# Patient Record
Sex: Female | Born: 1975 | Race: Black or African American | Hispanic: No | Marital: Single | Smoking: Never smoker
Health system: Southern US, Community
[De-identification: ages and names within clinical notes are randomized; demographics above are authoritative.]

## PROBLEM LIST (undated history)

## (undated) ENCOUNTER — Inpatient Hospital Stay (HOSPITAL_COMMUNITY): Payer: Self-pay

## (undated) ENCOUNTER — Emergency Department: Discharge status: Absent without leave | Payer: BC Managed Care – PPO

## (undated) DIAGNOSIS — M359 Systemic involvement of connective tissue, unspecified: Secondary | ICD-10-CM

## (undated) DIAGNOSIS — J302 Other seasonal allergic rhinitis: Secondary | ICD-10-CM

## (undated) DIAGNOSIS — M329 Systemic lupus erythematosus, unspecified: Secondary | ICD-10-CM

## (undated) DIAGNOSIS — M797 Fibromyalgia: Secondary | ICD-10-CM

## (undated) DIAGNOSIS — F419 Anxiety disorder, unspecified: Secondary | ICD-10-CM

## (undated) DIAGNOSIS — IMO0002 Reserved for concepts with insufficient information to code with codable children: Secondary | ICD-10-CM

## (undated) DIAGNOSIS — I10 Essential (primary) hypertension: Secondary | ICD-10-CM

## (undated) HISTORY — PX: FOOT SURGERY: SHX648

## (undated) HISTORY — PX: TONSILLECTOMY: SUR1361

---

## 2008-05-02 ENCOUNTER — Ambulatory Visit: Payer: Self-pay | Admitting: Podiatry

## 2008-05-02 ENCOUNTER — Other Ambulatory Visit: Payer: Self-pay

## 2008-05-02 ENCOUNTER — Emergency Department: Payer: Self-pay | Admitting: Emergency Medicine

## 2009-02-08 ENCOUNTER — Ambulatory Visit: Payer: Self-pay | Admitting: Internal Medicine

## 2009-10-09 ENCOUNTER — Emergency Department: Payer: Self-pay | Admitting: Emergency Medicine

## 2009-10-13 HISTORY — PX: DILATION AND CURETTAGE OF UTERUS: SHX78

## 2010-04-15 ENCOUNTER — Ambulatory Visit: Payer: Self-pay | Admitting: Nurse Practitioner

## 2010-04-15 ENCOUNTER — Inpatient Hospital Stay (HOSPITAL_COMMUNITY): Admission: AD | Admit: 2010-04-15 | Discharge: 2010-04-15 | Payer: Self-pay | Admitting: Obstetrics & Gynecology

## 2010-04-18 ENCOUNTER — Ambulatory Visit: Payer: Self-pay | Admitting: Obstetrics and Gynecology

## 2010-04-19 ENCOUNTER — Ambulatory Visit: Payer: Self-pay | Admitting: Obstetrics and Gynecology

## 2010-04-23 LAB — PATHOLOGY REPORT

## 2010-10-13 HISTORY — PX: CHOLECYSTECTOMY: SHX55

## 2010-11-07 ENCOUNTER — Observation Stay: Payer: Self-pay | Admitting: Specialist

## 2010-11-20 ENCOUNTER — Ambulatory Visit: Payer: Self-pay | Admitting: Surgery

## 2010-11-21 ENCOUNTER — Ambulatory Visit: Payer: Self-pay | Admitting: Surgery

## 2010-11-25 LAB — PATHOLOGY REPORT

## 2010-12-10 ENCOUNTER — Ambulatory Visit: Payer: Self-pay | Admitting: Surgery

## 2010-12-13 ENCOUNTER — Ambulatory Visit: Payer: Self-pay | Admitting: Surgery

## 2010-12-16 ENCOUNTER — Emergency Department: Payer: Self-pay | Admitting: Emergency Medicine

## 2010-12-20 ENCOUNTER — Observation Stay: Payer: Self-pay | Admitting: Internal Medicine

## 2010-12-25 LAB — PATHOLOGY REPORT

## 2010-12-29 LAB — GC/CHLAMYDIA PROBE AMP, GENITAL
Chlamydia, DNA Probe: NEGATIVE
GC Probe Amp, Genital: NEGATIVE

## 2010-12-29 LAB — URINE MICROSCOPIC-ADD ON

## 2010-12-29 LAB — URINALYSIS, ROUTINE W REFLEX MICROSCOPIC
Bilirubin Urine: NEGATIVE
Glucose, UA: NEGATIVE mg/dL
Ketones, ur: NEGATIVE mg/dL
Leukocytes, UA: NEGATIVE
Nitrite: NEGATIVE
Protein, ur: NEGATIVE mg/dL
Specific Gravity, Urine: >1.03 — ABNORMAL HIGH (ref 1.005–1.030)
Urobilinogen, UA: 0.2 mg/dL (ref 0.0–1.0)
pH: 5.5 (ref 5.0–8.0)

## 2010-12-29 LAB — CBC
HCT: 36.3 % (ref 36.0–46.0)
Hemoglobin: 12.4 g/dL (ref 12.0–15.0)
MCH: 29.5 pg (ref 26.0–34.0)
MCHC: 34 g/dL (ref 30.0–36.0)
MCV: 86.7 fL (ref 78.0–100.0)
Platelets: 290 10*3/uL (ref 150–400)
RBC: 4.19 MIL/uL (ref 3.87–5.11)
RDW: 12.6 % (ref 11.5–15.5)
WBC: 4.6 10*3/uL (ref 4.0–10.5)

## 2010-12-29 LAB — WET PREP, GENITAL
Clue Cells Wet Prep HPF POC: NONE SEEN
Trich, Wet Prep: NONE SEEN
Yeast Wet Prep HPF POC: NONE SEEN

## 2010-12-29 LAB — POCT PREGNANCY, URINE: Preg Test, Ur: POSITIVE

## 2010-12-29 LAB — ABO/RH: ABO/RH(D): O POS

## 2010-12-29 LAB — HCG, QUANTITATIVE, PREGNANCY: hCG, Beta Chain, Quant, S: 212 m[IU]/mL — ABNORMAL HIGH (ref <5)

## 2011-06-27 ENCOUNTER — Inpatient Hospital Stay (HOSPITAL_COMMUNITY)
Admission: AD | Admit: 2011-06-27 | Discharge: 2011-06-27 | Disposition: A | Discharge status: Home / Self Care | Payer: BC Managed Care – PPO | Source: Ambulatory Visit | Attending: Obstetrics and Gynecology | Admitting: Obstetrics and Gynecology

## 2011-06-27 ENCOUNTER — Encounter (HOSPITAL_COMMUNITY): Payer: Self-pay | Admitting: *Deleted

## 2011-06-27 ENCOUNTER — Inpatient Hospital Stay (HOSPITAL_COMMUNITY): Payer: BC Managed Care – PPO

## 2011-06-27 DIAGNOSIS — O2390 Unspecified genitourinary tract infection in pregnancy, unspecified trimester: Secondary | ICD-10-CM | POA: Insufficient documentation

## 2011-06-27 DIAGNOSIS — O98519 Other viral diseases complicating pregnancy, unspecified trimester: Secondary | ICD-10-CM | POA: Insufficient documentation

## 2011-06-27 DIAGNOSIS — N760 Acute vaginitis: Secondary | ICD-10-CM | POA: Insufficient documentation

## 2011-06-27 DIAGNOSIS — O4690 Antepartum hemorrhage, unspecified, unspecified trimester: Secondary | ICD-10-CM | POA: Diagnosis present

## 2011-06-27 DIAGNOSIS — A6000 Herpesviral infection of urogenital system, unspecified: Secondary | ICD-10-CM | POA: Insufficient documentation

## 2011-06-27 DIAGNOSIS — N898 Other specified noninflammatory disorders of vagina: Secondary | ICD-10-CM | POA: Diagnosis present

## 2011-06-27 DIAGNOSIS — O0990 Supervision of high risk pregnancy, unspecified, unspecified trimester: Secondary | ICD-10-CM

## 2011-06-27 DIAGNOSIS — M329 Systemic lupus erythematosus, unspecified: Secondary | ICD-10-CM | POA: Diagnosis present

## 2011-06-27 DIAGNOSIS — N96 Recurrent pregnancy loss: Secondary | ICD-10-CM | POA: Diagnosis present

## 2011-06-27 DIAGNOSIS — B9689 Other specified bacterial agents as the cause of diseases classified elsewhere: Secondary | ICD-10-CM | POA: Insufficient documentation

## 2011-06-27 DIAGNOSIS — A499 Bacterial infection, unspecified: Secondary | ICD-10-CM | POA: Insufficient documentation

## 2011-06-27 HISTORY — DX: Essential (primary) hypertension: I10

## 2011-06-27 HISTORY — DX: Systemic lupus erythematosus, unspecified: M32.9

## 2011-06-27 HISTORY — DX: Other seasonal allergic rhinitis: J30.2

## 2011-06-27 HISTORY — DX: Reserved for concepts with insufficient information to code with codable children: IMO0002

## 2011-06-27 LAB — WET PREP, GENITAL
Trich, Wet Prep: NONE SEEN
Yeast Wet Prep HPF POC: NONE SEEN

## 2011-06-27 MED ORDER — VALACYCLOVIR HCL 1 G PO TABS: 1000.0000 mg | ORAL_TABLET | Freq: Two times a day (BID) | ORAL | Status: DC

## 2011-06-27 MED ORDER — METRONIDAZOLE 500 MG PO TABS: 500.0000 mg | ORAL_TABLET | Freq: Two times a day (BID) | ORAL | Status: AC

## 2011-06-27 NOTE — ED Provider Notes (Signed)
History     Chief Complaint  Patient presents with  . Vaginal Bleeding  . Abdominal Cramping  . Back Pain   HPI Spotting starting yesterday, progressively getting heavier. Pain started this morning. Last intercourse on Tuesday. H/O 3 SAB and Lupus. Had normal u/s showing IUP with cardiac activity last Friday.   OB History    Grav Para Term Preterm Abortions TAB SAB Ect Mult Living   7 0 0 0 6 3 3          Past Medical History  Diagnosis Date  . Hypertension   . Seasonal allergic rhinitis   . Lupus     Past Surgical History  Procedure Date  . Dilation and curettage of uterus 2011  . Foot surgery   . Cholecystectomy 2012    Family History  Problem Relation Age of Onset  . Hypertension Mother   . Hypertension Father   . Diabetes Father   . Heart disease Maternal Grandmother   . Heart disease Paternal Grandmother     History  Substance Use Topics  . Smoking status: Never Smoker   . Smokeless tobacco: Not on file  . Alcohol Use: No    Allergies:  Allergies  Allergen Reactions  . Bean Pod Extract Hives  . Corn-Containing Products Hives  . Ibuprofen Swelling    Lips swell  . Peanut-Containing Drug Products Hives    Prescriptions prior to admission  Medication Sig Dispense Refill  . acetaminophen (TYLENOL) 325 MG tablet Take 650 mg by mouth every 6 (six) hours as needed. For headaches       . cetirizine (ZYRTEC) 10 MG tablet Take 10 mg by mouth daily.        . hydroxychloroquine (PLAQUENIL) 200 MG tablet Take 200 mg by mouth 2 (two) times daily.        . prenatal vitamin w/FE, FA (PRENATAL 1 + 1) 27-1 MG TABS Take 1 tablet by mouth daily.        . verapamil (CALAN-SR) 180 MG CR tablet Take 180 mg by mouth at bedtime.          Review of Systems  Constitutional: Negative.   Respiratory: Negative.   Cardiovascular: Negative.   Gastrointestinal: Positive for abdominal pain. Negative for nausea, vomiting, diarrhea and constipation.  Genitourinary: Negative  for dysuria, urgency, frequency, hematuria and flank pain.       Positive for vaginal bleeding, cramping  Musculoskeletal: Positive for back pain.  Neurological: Negative.   Psychiatric/Behavioral: Negative.    Physical Exam   Blood pressure 152/94, pulse 91, temperature 98 F (36.7 C), temperature source Oral, resp. rate 16, height 5\' 10"  (1.778 m), weight 91.627 kg (202 lb), last menstrual period 05/01/2011.  Physical Exam  Nursing note and vitals reviewed. Constitutional: She is oriented to person, place, and time. She appears well-developed and well-nourished. No distress.  HENT:  Head: Normocephalic and atraumatic.  Cardiovascular: Normal rate.   Respiratory: Effort normal.  GI: Soft. Bowel sounds are normal. She exhibits no mass. There is no tenderness. There is no rebound and no guarding.  Genitourinary: There is no rash or lesion on the right labia. There is no rash or lesion on the left labia. Uterus is not tender. Enlarged: Size c/w dates. Cervix exhibits motion tenderness. Cervix exhibits no discharge and no friability. Right adnexum displays no mass, no tenderness and no fullness. Left adnexum displays no mass, no tenderness and no fullness. There is tenderness around the vagina. No bleeding around the vagina.  Vaginal discharge (copius white discharge) found.       Multiple ulcerated vaginal lesions, friable, very tender on exam  Musculoskeletal: Normal range of motion.  Neurological: She is alert and oriented to person, place, and time.  Skin: Skin is warm and dry.  Psychiatric: She has a normal mood and affect.    MAU Course  Procedures US Ob Comp Less 14 Wks  06/27/2011  *RADIOLOGY REPORT*  Clinical Data: Vaginal bleeding; history of multiple spontaneous abortions.  OBSTETRIC <14 WK [place] AND TRANSVAGINAL OB US  Technique:  Both transabdominal and transvaginal ultrasound examinations were performed for complete evaluation of the gestation as well as the maternal uterus,  adnexal regions, and pelvic cul-de-sac.  Transvaginal technique was performed to assess early pregnancy.  Comparison:  Pelvic ultrasound performed 04/15/2010  Intrauterine gestational sac:  Visualized/normal in shape. Yolk sac: Yes Embryo: Yes Cardiac Activity: Yes Heart Rate: 172 bpm  CRL: 1.7   cm  8   w  2   d         [place] EDC: 02/04/2012  Maternal uterus/adnexae: The uterus is otherwise unremarkable in appearance.  No subchorionic hemorrhage is identified.  The ovaries are not well characterized on this study.  The right ovary measures approximately 3.3 x 2.2 x 2.1 cm, while the left ovary measures approximately 2.6 x 2.3 x 1.5 cm.  No suspicious adnexal masses are seen.  There is no definite evidence for ovarian torsion.  No free fluid is seen within the pelvic cul-de-sac.  IMPRESSION: Single live intrauterine pregnancy noted, with a crown-rump length of 1.7 cm, corresponding to a gestational age of [redacted] weeks 2 days. This matches the gestational age of [redacted] weeks 1 day by LMP, which reflects an estimated date of delivery of February 05, 2012.  Original Report Authenticated By: Tonia Ghent, M.D.   Results for orders placed during the hospital encounter of 06/27/11 (from the past 24 hour(s))  WET PREP, GENITAL     Status: Abnormal   Collection Time   06/27/11  4:30 AM      Component Value Range   Yeast, Wet Prep NONE SEEN  NONE SEEN    Trich, Wet Prep NONE SEEN  NONE SEEN    Clue Cells, Wet Prep FEW (*) NONE SEEN    WBC, Wet Prep HPF POC FEW (*) NONE SEEN       Assessment and Plan  35 y.o. G7P0060 at [redacted]w[redacted]d Living IUP ? HSV outbreak - given Valtrex rx tonight for presumptive treatment, HSV culture pending BV - treated with Flagyl Follow up in office as scheduled at 11 AM today Dr. [name] agrees with plan  [facility] 06/27/2011, 4:53 AM

## 2011-06-27 NOTE — Progress Notes (Signed)
Pt G7 P0, hx 3 SAB, 3 TAB.  Pt reports spotting earlier today, now bleeding heavy with cramping and low back pain.

## 2011-06-28 LAB — GC/CHLAMYDIA PROBE AMP, GENITAL
Chlamydia, DNA Probe: NEGATIVE
GC Probe Amp, Genital: NEGATIVE

## 2011-06-30 LAB — HERPES SIMPLEX VIRUS CULTURE: Culture: NOT DETECTED

## 2011-07-03 LAB — RUBELLA ANTIBODY, IGM: Rubella: IMMUNE

## 2011-07-03 LAB — HIV ANTIBODY (ROUTINE TESTING W REFLEX): HIV: NONREACTIVE

## 2011-07-03 LAB — HEPATITIS B SURFACE ANTIGEN: Hepatitis B Surface Ag: NEGATIVE

## 2011-07-03 LAB — ANTIBODY SCREEN: Antibody Screen: NEGATIVE

## 2011-07-03 LAB — RPR: RPR: NONREACTIVE

## 2011-08-07 ENCOUNTER — Encounter (HOSPITAL_COMMUNITY): Payer: Self-pay | Admitting: *Deleted

## 2011-08-07 ENCOUNTER — Inpatient Hospital Stay (HOSPITAL_COMMUNITY)
Admission: AD | Admit: 2011-08-07 | Discharge: 2011-08-07 | Disposition: A | Discharge status: Home / Self Care | Payer: BC Managed Care – PPO | Source: Ambulatory Visit | Attending: Obstetrics and Gynecology | Admitting: Obstetrics and Gynecology

## 2011-08-07 ENCOUNTER — Other Ambulatory Visit: Payer: Self-pay

## 2011-08-07 DIAGNOSIS — O99891 Other specified diseases and conditions complicating pregnancy: Secondary | ICD-10-CM | POA: Insufficient documentation

## 2011-08-07 DIAGNOSIS — J069 Acute upper respiratory infection, unspecified: Secondary | ICD-10-CM | POA: Insufficient documentation

## 2011-08-07 DIAGNOSIS — R0602 Shortness of breath: Secondary | ICD-10-CM

## 2011-08-07 MED ORDER — GI COCKTAIL ~~LOC~~
30.0000 mL | Freq: Once | ORAL | Status: AC
  Administered 2011-08-07: 30 mL via ORAL
  Filled 2011-08-07: qty 30

## 2011-08-07 NOTE — ED Notes (Signed)
0220 Henrietta Hoover PA in to see pt

## 2011-08-07 NOTE — ED Notes (Signed)
Respiratory therapy in to do EKG

## 2011-08-07 NOTE — ED Notes (Signed)
Henrietta Hoover PA in to discuss d/c plan. Pt still feels heaviness in chest but is breathing much easier now. States was weaned off Effexor about 2 wks ago which was taking for anxiety and stress. Has very stressful job.

## 2011-08-07 NOTE — ED Provider Notes (Signed)
History   Pt presents today c/o SOB since about 2pm today. She states she has had URI sx for a couple of days but became concerned when she started having SOB. She c/o congestion, fever. She states she feels like someone is pressing on her chest.  Chief Complaint  Patient presents with  . Shortness of Breath   HPI  OB History    Grav Para Term Preterm Abortions TAB SAB Ect Mult Living   7 0 0 0 6 3 3          Past Medical History  Diagnosis Date  . Hypertension   . Seasonal allergic rhinitis   . Lupus     Past Surgical History  Procedure Date  . Dilation and curettage of uterus 2011  . Foot surgery   . Cholecystectomy 2012    Family History  Problem Relation Age of Onset  . Hypertension Mother   . Hypertension Father   . Diabetes Father   . Heart disease Maternal Grandmother   . Heart disease Paternal Grandmother     History  Substance Use Topics  . Smoking status: Never Smoker   . Smokeless tobacco: Not on file  . Alcohol Use: No    Allergies:  Allergies  Allergen Reactions  . Bean Pod Extract Hives  . Corn-Containing Products Hives  . Ibuprofen Swelling    Lips swell  . Peanut-Containing Drug Products Hives    Prescriptions prior to admission  Medication Sig Dispense Refill  . acetaminophen (TYLENOL) 325 MG tablet Take 650 mg by mouth every 6 (six) hours as needed. For headaches       . cetirizine (ZYRTEC) 10 MG tablet Take 10 mg by mouth daily.        . hydroxychloroquine (PLAQUENIL) 200 MG tablet Take 200 mg by mouth 2 (two) times daily.        Marland Kitchen labetalol (NORMODYNE) 100 MG tablet Take 100 mg by mouth 2 (two) times daily.        . prenatal vitamin w/FE, FA (PRENATAL 1 + 1) 27-1 MG TABS Take 1 tablet by mouth daily.        . valACYclovir (VALTREX) 1000 MG tablet Take 1 tablet (1,000 mg total) by mouth 2 (two) times daily.  20 tablet  0  . verapamil (CALAN-SR) 180 MG CR tablet Take 180 mg by mouth at bedtime.          Review of Systems    Constitutional: Negative for fever.  Respiratory: Positive for shortness of breath. Negative for cough, hemoptysis, sputum production and wheezing.   Cardiovascular: Positive for chest pain. Negative for palpitations, orthopnea, claudication and leg swelling.  Gastrointestinal: Negative for nausea, vomiting, abdominal pain, diarrhea and constipation.  Genitourinary: Negative for dysuria, urgency, frequency and hematuria.  Neurological: Negative for dizziness and headaches.  Psychiatric/Behavioral: Negative for depression and suicidal ideas.   Physical Exam   Blood pressure 125/88, pulse 79, temperature 97.3 F (36.3 C), resp. rate 22, height 5\' 10"  (1.778 m), weight 208 lb (94.348 kg), last menstrual period 05/01/2011, SpO2 99.00%.  Physical Exam  Nursing note and vitals reviewed. Constitutional: She is oriented to person, place, and time. She appears well-developed and well-nourished. No distress.  HENT:  Head: Normocephalic and atraumatic.  Eyes: EOM are normal. Pupils are equal, round, and reactive to light.  Cardiovascular: Normal rate, regular rhythm and normal heart sounds.  Exam reveals no gallop and no friction rub.   No murmur heard. Respiratory: Effort normal and breath  sounds normal. No respiratory distress. She has no wheezes. She has no rales. She exhibits no tenderness.  Neurological: She is alert and oriented to person, place, and time.  Skin: Skin is warm and dry. She is not diaphoretic.  Psychiatric: She has a normal mood and affect. Her behavior is normal. Judgment and thought content normal.    MAU Course  Procedures  ECG: NL sinus rhythm  O2 sat 99%.  Discussed pt with Dr. Marcelle Overlie. Ok to dc to home.   Assessment and Plan  URI: discussed with pt at length. Pt has f/u scheduled. Discussed diet, activity,risks,and precautions. She will return immediately if her sx worsen or do not improve.  Clinton Gallant. Philbert Ocallaghan III, DrHSc, MPAS, PA-C  08/07/2011, 2:21 AM    Henrietta Hoover, PA 08/07/11 [phone]

## 2011-08-07 NOTE — Progress Notes (Signed)
Pt reports she has had a "stuffy nose" for a few days, denies cough. States she has felt short of breath all day today and a "heaviness in my chest", denies history of asthma. Pt reports she is 14 weeks preg. , pt reports a history of lupus

## 2011-09-25 DIAGNOSIS — M359 Systemic involvement of connective tissue, unspecified: Secondary | ICD-10-CM | POA: Insufficient documentation

## 2011-11-05 ENCOUNTER — Inpatient Hospital Stay (HOSPITAL_COMMUNITY)
Admission: AD | Admit: 2011-11-05 | Discharge: 2011-11-05 | Disposition: A | Discharge status: Home / Self Care | Payer: BC Managed Care – PPO | Source: Ambulatory Visit | Attending: Obstetrics and Gynecology | Admitting: Obstetrics and Gynecology

## 2011-11-05 ENCOUNTER — Encounter (HOSPITAL_COMMUNITY): Payer: Self-pay | Admitting: *Deleted

## 2011-11-05 ENCOUNTER — Inpatient Hospital Stay (HOSPITAL_COMMUNITY): Payer: BC Managed Care – PPO

## 2011-11-05 DIAGNOSIS — S300XXA Contusion of lower back and pelvis, initial encounter: Secondary | ICD-10-CM | POA: Insufficient documentation

## 2011-11-05 DIAGNOSIS — W19XXXA Unspecified fall, initial encounter: Secondary | ICD-10-CM

## 2011-11-05 DIAGNOSIS — O26899 Other specified pregnancy related conditions, unspecified trimester: Secondary | ICD-10-CM

## 2011-11-05 DIAGNOSIS — R109 Unspecified abdominal pain: Secondary | ICD-10-CM | POA: Insufficient documentation

## 2011-11-05 DIAGNOSIS — T148XXA Other injury of unspecified body region, initial encounter: Secondary | ICD-10-CM

## 2011-11-05 DIAGNOSIS — O99891 Other specified diseases and conditions complicating pregnancy: Secondary | ICD-10-CM | POA: Insufficient documentation

## 2011-11-05 DIAGNOSIS — W108XXA Fall (on) (from) other stairs and steps, initial encounter: Secondary | ICD-10-CM | POA: Insufficient documentation

## 2011-11-05 LAB — KLEIHAUER-BETKE STAIN
Fetal Cells %: 0 %
Quantitation Fetal Hemoglobin: 0 mL

## 2011-11-05 NOTE — Progress Notes (Signed)
Pt states she fell down on the bottom abour 1615 today.Pt states she feels crampy and sore. Pt her bottom is really sore

## 2011-11-05 NOTE — ED Provider Notes (Signed)
History   Pt presents today c/o falling. She states she was walking down some steps and slipped and fell on her buttocks. Since that time she has had some lower back "soreness" and lower abd cramping. She denies direct trauma to the abd. She denies vag dc or bleeding and reports GFM.  Chief Complaint  Patient presents with  . Abdominal Pain   HPI  OB History    Grav Para Term Preterm Abortions TAB SAB Ect Mult Living   7 0 0 0 6 3 3          Past Medical History  Diagnosis Date  . Hypertension   . Seasonal allergic rhinitis   . Lupus     Past Surgical History  Procedure Date  . Dilation and curettage of uterus 2011  . Foot surgery   . Cholecystectomy 2012    Family History  Problem Relation Age of Onset  . Hypertension Mother   . Hypertension Father   . Diabetes Father   . Heart disease Maternal Grandmother   . Heart disease Paternal Grandmother     History  Substance Use Topics  . Smoking status: Never Smoker   . Smokeless tobacco: Not on file  . Alcohol Use: No    Allergies:  Allergies  Allergen Reactions  . Bean Pod Extract Hives  . Corn-Containing Products Hives  . Ibuprofen Swelling    Lips swell  . Peanut-Containing Drug Products Hives    Prescriptions prior to admission  Medication Sig Dispense Refill  . acetaminophen (TYLENOL) 325 MG tablet Take 650 mg by mouth every 6 (six) hours as needed. For headaches       . cetirizine (ZYRTEC) 10 MG tablet Take 10 mg by mouth daily.        . hydroxychloroquine (PLAQUENIL) 200 MG tablet Take 200 mg by mouth 2 (two) times daily.        Marland Kitchen labetalol (NORMODYNE) 100 MG tablet Take 100 mg by mouth 2 (two) times daily.        . prenatal vitamin w/FE, FA (PRENATAL 1 + 1) 27-1 MG TABS Take 2 tablets by mouth daily.         Review of Systems  Constitutional: Negative for fever and chills.  Eyes: Negative for blurred vision and double vision.  Respiratory: Negative for cough, hemoptysis, sputum production,  shortness of breath and wheezing.   Cardiovascular: Negative for chest pain and palpitations.  Gastrointestinal: Positive for abdominal pain. Negative for nausea, vomiting, diarrhea and constipation.  Genitourinary: Negative for dysuria, urgency, frequency and hematuria.  Neurological: Negative for dizziness and headaches.  Psychiatric/Behavioral: Negative for depression and suicidal ideas.   Physical Exam   Blood pressure 131/78, pulse 68, temperature 98.1 F (36.7 C), temperature source Oral, resp. rate 18, height 5\' 10"  (1.778 m), weight 218 lb (98.884 kg), last menstrual period 05/01/2011.  Physical Exam  Nursing note and vitals reviewed. Constitutional: She is oriented to person, place, and time. She appears well-developed and well-nourished. No distress.  HENT:  Head: Normocephalic and atraumatic.  Eyes: EOM are normal. Pupils are equal, round, and reactive to light.  GI: Soft. She exhibits no distension and no mass. There is no tenderness. There is no rebound and no guarding.  Genitourinary: No bleeding around the vagina. No vaginal discharge found.       Cervix Lg/closed.  Neurological: She is alert and oriented to person, place, and time.  Skin: Skin is warm and dry. She is not diaphoretic.  Psychiatric:  She has a normal mood and affect. Her behavior is normal. Judgment and thought content normal.    MAU Course  Procedures  NST reactive for 26wks.  Discussed pt with Dr. Rana Snare. He ordered an [place] and Kleihauer-Betke. If NL then pt will be dc'd to home with precautions.  Assessment and Plan  Care of pt has been turned over to [facility], FNP at 7pm.  Clinton Gallant. Rice III, DrHSc, MPAS, PA-C  11/05/2011, 6:48 PM   Henrietta Hoover, PA 11/05/11 1853 Results for orders placed during the hospital encounter of 11/05/11 (from the past 24 hour(s))  KLEIHAUER-BETKE STAIN     Status: Normal   Collection Time   11/05/11  7:00 PM      Component Value Range   Fetal Cells % 0.0      Quantitation Fetal Hemoglobin 0     Ultrasound shows no placental abruption or previa, normal fluid, cervical length 3.4 cm  Assessment: Fall @ [redacted] weeks gestation   Contusion to buttock   Abdominal cramping  Plan:  Will d/c home and patient will follow up in the office.   She will return for vaginal bleeding, leaking of fluid or other problems.    [place], [place] 11/05/11 2135

## 2011-11-26 ENCOUNTER — Encounter (HOSPITAL_COMMUNITY): Payer: Self-pay | Admitting: *Deleted

## 2011-11-26 ENCOUNTER — Inpatient Hospital Stay (HOSPITAL_COMMUNITY)
Admission: AD | Admit: 2011-11-26 | Discharge: 2011-11-26 | Disposition: A | Discharge status: Home / Self Care | Payer: BC Managed Care – PPO | Source: Ambulatory Visit | Attending: Obstetrics and Gynecology | Admitting: Obstetrics and Gynecology

## 2011-11-26 DIAGNOSIS — K529 Noninfective gastroenteritis and colitis, unspecified: Secondary | ICD-10-CM

## 2011-11-26 DIAGNOSIS — O99891 Other specified diseases and conditions complicating pregnancy: Secondary | ICD-10-CM | POA: Insufficient documentation

## 2011-11-26 DIAGNOSIS — K5289 Other specified noninfective gastroenteritis and colitis: Secondary | ICD-10-CM | POA: Insufficient documentation

## 2011-11-26 DIAGNOSIS — I10 Essential (primary) hypertension: Secondary | ICD-10-CM | POA: Insufficient documentation

## 2011-11-26 DIAGNOSIS — M329 Systemic lupus erythematosus, unspecified: Secondary | ICD-10-CM | POA: Insufficient documentation

## 2011-11-26 LAB — FETAL FIBRONECTIN: Fetal Fibronectin: NEGATIVE

## 2011-11-26 MED ORDER — LOPERAMIDE HCL 2 MG PO CAPS
4.0000 mg | ORAL_CAPSULE | Freq: Once | ORAL | Status: AC
  Administered 2011-11-26: 4 mg via ORAL
  Filled 2011-11-26: qty 2

## 2011-11-26 MED ORDER — PROMETHAZINE HCL 25 MG PO TABS: 25.0000 mg | ORAL_TABLET | Freq: Four times a day (QID) | ORAL | Status: AC | PRN

## 2011-11-26 MED ORDER — LACTATED RINGERS IV BOLUS (SEPSIS)
1000.0000 mL | Freq: Once | INTRAVENOUS | Status: AC
  Administered 2011-11-26: 1000 mL via INTRAVENOUS

## 2011-11-26 MED ORDER — ONDANSETRON HCL 4 MG/2ML IJ SOLN
4.0000 mg | Freq: Once | INTRAMUSCULAR | Status: AC
  Administered 2011-11-26: 4 mg via INTRAVENOUS
  Filled 2011-11-26: qty 2

## 2011-11-26 MED ORDER — PROMETHAZINE HCL 25 MG/ML IJ SOLN
25.0000 mg | Freq: Once | INTRAMUSCULAR | Status: AC
  Administered 2011-11-26: 25 mg via INTRAVENOUS
  Filled 2011-11-26: qty 1

## 2011-11-26 MED ORDER — ACETAMINOPHEN 500 MG PO TABS
1000.0000 mg | ORAL_TABLET | Freq: Once | ORAL | Status: AC
  Administered 2011-11-26: 1000 mg via ORAL
  Filled 2011-11-26: qty 2

## 2011-11-26 NOTE — ED Provider Notes (Signed)
History   Pt presents today c/o N&V and diarrhea for the past 2-3 days. She was seen in Dr. Huel Coventry office and was found to be dehydrated. She was sent to MAU for IV hydration. Pt denies abd pain, vag dc, bleeding, fever, or any other sx at this time.  Chief Complaint  Patient presents with  . Emesis  . Diarrhea   HPI  OB History    Grav Para Term Preterm Abortions TAB SAB Ect Mult Living   7 0 0 0 6 3 3          Past Medical History  Diagnosis Date  . Hypertension   . Seasonal allergic rhinitis   . Lupus     Past Surgical History  Procedure Date  . Dilation and curettage of uterus 2011  . Foot surgery   . Cholecystectomy 2012    Family History  Problem Relation Age of Onset  . Hypertension Mother   . Hypertension Father   . Diabetes Father   . Heart disease Maternal Grandmother   . Heart disease Paternal Grandmother     History  Substance Use Topics  . Smoking status: Never Smoker   . Smokeless tobacco: Never Used  . Alcohol Use: No    Allergies:  Allergies  Allergen Reactions  . Bean Pod Extract Hives  . Corn-Containing Products Hives  . Ibuprofen Swelling    Lips swell  . Peanut-Containing Drug Products Hives    Prescriptions prior to admission  Medication Sig Dispense Refill  . acetaminophen (TYLENOL) 325 MG tablet Take 650 mg by mouth every 6 (six) hours as needed. For headaches       . cetirizine (ZYRTEC) 10 MG tablet Take 10 mg by mouth daily.        . hydroxychloroquine (PLAQUENIL) 200 MG tablet Take 200 mg by mouth 2 (two) times daily.        Marland Kitchen labetalol (NORMODYNE) 100 MG tablet Take 100 mg by mouth 2 (two) times daily.        . prenatal vitamin w/FE, FA (PRENATAL 1 + 1) 27-1 MG TABS Take 2 tablets by mouth daily.         Review of Systems  Constitutional: Positive for malaise/fatigue. Negative for fever.  Eyes: Negative for blurred vision and double vision.  Respiratory: Negative for cough, hemoptysis, sputum production, shortness of  breath and wheezing.   Cardiovascular: Negative for chest pain and palpitations.  Gastrointestinal: Positive for nausea, vomiting and diarrhea. Negative for abdominal pain, constipation and blood in stool.  Genitourinary: Negative for dysuria, urgency, frequency and hematuria.  Neurological: Positive for weakness. Negative for dizziness and headaches.  Psychiatric/Behavioral: Negative for depression and suicidal ideas.   Physical Exam   Blood pressure 139/81, pulse 74, temperature 97.7 F (36.5 C), temperature source Oral, resp. rate 20, last menstrual period 05/01/2011, SpO2 100.00%.  Physical Exam  Nursing note and vitals reviewed. Constitutional: She is oriented to person, place, and time. She appears well-developed and well-nourished. No distress.  HENT:  Head: Normocephalic and atraumatic.  GI: Soft. She exhibits no distension and no mass. There is no tenderness. There is no rebound and no guarding.  Genitourinary: No bleeding around the vagina. No vaginal discharge found.       Cervix Cl/50/-3. Ffn done prior to digital exam.  Neurological: She is alert and oriented to person, place, and time.  Skin: Skin is warm and dry. She is not diaphoretic.  Psychiatric: She has a normal mood and affect. Her  behavior is normal. Judgment and thought content normal.    MAU Course  Procedures  Discussed pt with Dr. Henderson Cloud.  Results for orders placed during the hospital encounter of 11/26/11 (from the past 24 hour(s))  FETAL FIBRONECTIN     Status: Normal   Collection Time   11/26/11  3:10 PM      Component Value Range   Fetal Fibronectin NEGATIVE  NEGATIVE    NST reactive for 29wks with no ctx at this time. Pt no longer feeling ctx.   Assessment and Plan  Gastroenteritis: discussed with pt at length. She has f/u scheduled. Reminded of [facility]. Discussed diet, activity, risks, and precautions.  Clinton Gallant. Kassidy Dockendorf III, DrHSc, MPAS, PA-C  11/26/2011, 1:22 PM   Henrietta Hoover, PA 11/26/11  1654  Henrietta Hoover, [place] 11/26/11 1655

## 2011-11-26 NOTE — Progress Notes (Signed)
Patient states she has been having nausea, vomiting and diarrhea for 2 days. Was sent from the office for IVF. Reports good fetal movement, no pain or bleeding.

## 2011-11-26 NOTE — Discharge Instructions (Signed)
Viral Gastroenteritis Gastroenteritis is an illness of the intestines. It is sometimes called "stomach flu." It causes nausea, vomiting, stomach cramps, watery poop (diarrhea) and a slight fever. This illness often clears up in 2 to 3 days. However, it can be serious when people who lose too much fluid from throwing up (vomiting) or watery poop. When too much fluid is lost and has not been replaced, it is called dehydration.  HOME CARE   Wash your hands often.   Rest.   Drink fluids slowly. Drinking too much or too fast can cause you to throw up.   Drink oral rehydration solution (ORS) as told by your doctor. Ask your doctor how to take ORS if you do not understand.   Stay away from really hot or cold liquids.   Avoid fruit, milk or other dairy products.   Do not eat too much at once.   Avoid tobacco, alcohol and drugs that upset your stomach.   When watery poop stops, eat Braeleigh Pyper, bananas, apples with no skin, and dry toast.  GET HELP RIGHT AWAY IF:   You become weak, dizzy, or pass out (faint).   You cannot keep fluids down.   You have a dry mouth, no tears, and pee (urinate) less.   Belly (abdominal) pain starts, feels worse, or stays in one place.   You have a fever.   Watery poop has blood or mucus in it.   You become confused.   After 2 days, you are still throwing up or having watery poop.  MAKE SURE YOU:   Understand these instructions.   Will watch your condition.   Will get help right away if you are not doing well or get worse.  Document Released: 03/17/2008 Document Revised: 06/11/2011 Document Reviewed: 03/17/2008 [employer] Patient Information 2012 [employer]. 

## 2011-12-09 ENCOUNTER — Encounter (HOSPITAL_COMMUNITY): Payer: Self-pay

## 2011-12-09 ENCOUNTER — Inpatient Hospital Stay (HOSPITAL_COMMUNITY)
Admission: AD | Admit: 2011-12-09 | Discharge: 2011-12-09 | Disposition: A | Discharge status: Home Health | Payer: BC Managed Care – PPO | Source: Ambulatory Visit | Attending: Obstetrics and Gynecology | Admitting: Obstetrics and Gynecology

## 2011-12-09 DIAGNOSIS — O99891 Other specified diseases and conditions complicating pregnancy: Secondary | ICD-10-CM | POA: Insufficient documentation

## 2011-12-09 DIAGNOSIS — R51 Headache: Secondary | ICD-10-CM | POA: Insufficient documentation

## 2011-12-09 DIAGNOSIS — R1013 Epigastric pain: Secondary | ICD-10-CM | POA: Insufficient documentation

## 2011-12-09 DIAGNOSIS — O479 False labor, unspecified: Secondary | ICD-10-CM

## 2011-12-09 DIAGNOSIS — O26899 Other specified pregnancy related conditions, unspecified trimester: Secondary | ICD-10-CM

## 2011-12-09 DIAGNOSIS — O4700 False labor before 37 completed weeks of gestation, unspecified trimester: Secondary | ICD-10-CM

## 2011-12-09 HISTORY — DX: Anxiety disorder, unspecified: F41.9

## 2011-12-09 LAB — URINALYSIS, ROUTINE W REFLEX MICROSCOPIC
Bilirubin Urine: NEGATIVE
Glucose, UA: NEGATIVE mg/dL
Hgb urine dipstick: NEGATIVE
Ketones, ur: NEGATIVE mg/dL
Leukocytes, UA: NEGATIVE
Nitrite: NEGATIVE
Protein, ur: NEGATIVE mg/dL
Specific Gravity, Urine: >1.03 — ABNORMAL HIGH (ref 1.005–1.030)
Urobilinogen, UA: 0.2 mg/dL (ref 0.0–1.0)
pH: 6 (ref 5.0–8.0)

## 2011-12-09 LAB — COMPREHENSIVE METABOLIC PANEL
ALT: 11 U/L (ref 0–35)
AST: 15 U/L (ref 0–37)
Albumin: 2.7 g/dL — ABNORMAL LOW (ref 3.5–5.2)
Alkaline Phosphatase: 85 U/L (ref 39–117)
BUN: 8 mg/dL (ref 6–23)
CO2: 24 mEq/L (ref 19–32)
Calcium: 9.3 mg/dL (ref 8.4–10.5)
Chloride: 102 mEq/L (ref 96–112)
Creatinine, Ser: 0.79 mg/dL (ref 0.50–1.10)
GFR calc Af Amer: >90 mL/min (ref >90)
GFR calc non Af Amer: >90 mL/min (ref >90)
Glucose, Bld: 70 mg/dL (ref 70–99)
Potassium: 3.6 mEq/L (ref 3.5–5.1)
Sodium: 134 mEq/L — ABNORMAL LOW (ref 135–145)
Total Bilirubin: 0.2 mg/dL — ABNORMAL LOW (ref 0.3–1.2)
Total Protein: 6.9 g/dL (ref 6.0–8.3)

## 2011-12-09 LAB — CBC
HCT: 34.1 % — ABNORMAL LOW (ref 36.0–46.0)
Hemoglobin: 11.4 g/dL — ABNORMAL LOW (ref 12.0–15.0)
MCH: 28.5 pg (ref 26.0–34.0)
MCHC: 33.4 g/dL (ref 30.0–36.0)
MCV: 85.3 fL (ref 78.0–100.0)
Platelets: 307 10*3/uL (ref 150–400)
RBC: 4 MIL/uL (ref 3.87–5.11)
RDW: 12.2 % (ref 11.5–15.5)
WBC: 9.8 10*3/uL (ref 4.0–10.5)

## 2011-12-09 LAB — LACTATE DEHYDROGENASE: LDH: 165 U/L (ref 94–250)

## 2011-12-09 LAB — URIC ACID: Uric Acid, Serum: 4.1 mg/dL (ref 2.4–7.0)

## 2011-12-09 MED ORDER — TERBUTALINE SULFATE 1 MG/ML IJ SOLN
0.2500 mg | Freq: Once | INTRAMUSCULAR | Status: AC
  Administered 2011-12-09: 0.25 mg via SUBCUTANEOUS

## 2011-12-09 MED ORDER — TERBUTALINE SULFATE 1 MG/ML IJ SOLN
INTRAMUSCULAR | Status: AC
  Filled 2011-12-09: qty 1

## 2011-12-09 MED ORDER — GI COCKTAIL ~~LOC~~
30.0000 mL | Freq: Once | ORAL | Status: AC
  Administered 2011-12-09: 30 mL via ORAL
  Filled 2011-12-09: qty 30

## 2011-12-09 MED ORDER — FAMOTIDINE 20 MG PO TABS: 20.0000 mg | ORAL_TABLET | Freq: Two times a day (BID) | ORAL | Status: DC

## 2011-12-09 NOTE — Progress Notes (Signed)
Patient is in with c/o headache without relief from tylenol 1000mg  at 1700pm, acid reflux, floaters and tingling in her legs. She reports decreased fetal movement. Denies any vaginal bleeding or lof.

## 2011-12-09 NOTE — ED Provider Notes (Signed)
History   Pt presents today c/o HA, blurry vision, and epigastric pain since last pm. She states she tried TUMS without relief. She reports GFM and denies vag dc or bleeding.  Chief Complaint  Patient presents with  . Heartburn  . Headache   HPI  OB History    Grav Para Term Preterm Abortions TAB SAB Ect Mult Living   7 0 0 0 6 3 3          Past Medical History  Diagnosis Date  . Hypertension   . Seasonal allergic rhinitis   . Lupus   . Anxiety     Past Surgical History  Procedure Date  . Dilation and curettage of uterus 2011  . Foot surgery   . Cholecystectomy 2012  . Tonsillectomy     Family History  Problem Relation Age of Onset  . Hypertension Mother   . Hypertension Father   . Diabetes Father   . Heart disease Maternal Grandmother   . Heart disease Paternal Grandmother     History  Substance Use Topics  . Smoking status: Never Smoker   . Smokeless tobacco: Never Used  . Alcohol Use: No    Allergies:  Allergies  Allergen Reactions  . Bean Pod Extract Hives  . Corn-Containing Products Hives  . Ibuprofen Swelling    Lips swell  . Peanut-Containing Drug Products Hives    Prescriptions prior to admission  Medication Sig Dispense Refill  . acetaminophen (TYLENOL) 325 MG tablet Take 650 mg by mouth every 6 (six) hours as needed. For headaches       . cetirizine (ZYRTEC) 10 MG tablet Take 10 mg by mouth daily.        . hydroxychloroquine (PLAQUENIL) 200 MG tablet Take 200 mg by mouth 2 (two) times daily.        Marland Kitchen labetalol (NORMODYNE) 100 MG tablet Take 100 mg by mouth 2 (two) times daily.        . prenatal vitamin w/FE, FA (PRENATAL 1 + 1) 27-1 MG TABS Take 2 tablets by mouth daily.         Review of Systems  Constitutional: Negative for fever and chills.  Eyes: Positive for blurred vision and photophobia. Negative for double vision, pain and discharge.  Respiratory: Negative for cough, hemoptysis, sputum production, shortness of breath and wheezing.    Cardiovascular: Negative for chest pain and palpitations.  Gastrointestinal: Positive for abdominal pain. Negative for nausea, vomiting, diarrhea and constipation.  Genitourinary: Negative for dysuria, urgency, frequency and hematuria.  Neurological: Positive for headaches. Negative for dizziness.  Psychiatric/Behavioral: Negative for depression and suicidal ideas.   Physical Exam   Blood pressure 127/81, pulse 77, temperature 97.2 F (36.2 C), temperature source Oral, resp. rate 20, height 5\' 8"  (1.727 m), weight 223 lb 6.4 oz (101.334 kg), last menstrual period 05/01/2011, SpO2 100.00%.  Physical Exam  Nursing note and vitals reviewed. Constitutional: She is oriented to person, place, and time. She appears well-developed and well-nourished. No distress.  HENT:  Head: Normocephalic and atraumatic.  Eyes: EOM are normal. Pupils are equal, round, and reactive to light.  GI: Soft. She exhibits no distension and no mass. There is no tenderness. There is no rebound and no guarding.  Neurological: She is alert and oriented to person, place, and time.  Skin: Skin is warm and dry. She is not diaphoretic.  Psychiatric: She has a normal mood and affect. Her behavior is normal. Judgment and thought content normal.    MAU  Course  Procedures  Pt now with ctx every 2-4 min.  Discussed pt with Dr. Arelia Sneddon. Advised to give terbutaline 0.25mg .  Results for orders placed during the hospital encounter of 12/09/11 (from the past 72 hour(s))  URINALYSIS, ROUTINE W REFLEX MICROSCOPIC     Status: Abnormal   Collection Time   12/09/11  6:40 PM      Component Value Range Comment   Color, Urine YELLOW  YELLOW     APPearance HAZY (*) CLEAR     Specific Gravity, Urine >1.030 (*) 1.005 - 1.030     pH 6.0  5.0 - 8.0     Glucose, UA NEGATIVE  NEGATIVE (mg/dL)    Hgb urine dipstick NEGATIVE  NEGATIVE     Bilirubin Urine NEGATIVE  NEGATIVE     Ketones, ur NEGATIVE  NEGATIVE (mg/dL)    Protein, ur  NEGATIVE  NEGATIVE (mg/dL)    Urobilinogen, UA 0.2  0.0 - 1.0 (mg/dL)    Nitrite NEGATIVE  NEGATIVE     Leukocytes, UA NEGATIVE  NEGATIVE  MICROSCOPIC NOT DONE ON URINES WITH NEGATIVE PROTEIN, BLOOD, LEUKOCYTES, NITRITE, OR GLUCOSE <1000 mg/dL.  CBC     Status: Abnormal   Collection Time   12/09/11  6:50 PM      Component Value Range Comment   WBC 9.8  4.0 - 10.5 (K/uL)    RBC 4.00  3.87 - 5.11 (MIL/uL)    Hemoglobin 11.4 (*) 12.0 - 15.0 (g/dL)    HCT 16.1 (*) 09.6 - 46.0 (%)    MCV 85.3  78.0 - 100.0 (fL)    MCH 28.5  26.0 - 34.0 (pg)    MCHC 33.4  30.0 - 36.0 (g/dL)    RDW 04.5  40.9 - 81.1 (%)    Platelets 307  150 - 400 (K/uL)   COMPREHENSIVE METABOLIC PANEL     Status: Abnormal   Collection Time   12/09/11  6:50 PM      Component Value Range Comment   Sodium 134 (*) 135 - 145 (mEq/L)    Potassium 3.6  3.5 - 5.1 (mEq/L)    Chloride 102  96 - 112 (mEq/L)    CO2 24  19 - 32 (mEq/L)    Glucose, Bld 70  70 - 99 (mg/dL)    BUN 8  6 - 23 (mg/dL)    Creatinine, Ser 9.14  0.50 - 1.10 (mg/dL)    Calcium 9.3  8.4 - 10.5 (mg/dL)    Total Protein 6.9  6.0 - 8.3 (g/dL)    Albumin 2.7 (*) 3.5 - 5.2 (g/dL)    AST 15  0 - 37 (U/L)    ALT 11  0 - 35 (U/L)    Alkaline Phosphatase 85  39 - 117 (U/L)    Total Bilirubin 0.2 (*) 0.3 - 1.2 (mg/dL)    GFR calc non Af Amer >90  >90 (mL/min)    GFR calc Af Amer >90  >90 (mL/min)   LACTATE DEHYDROGENASE     Status: Normal   Collection Time   12/09/11  6:50 PM      Component Value Range Comment   LD 165  94 - 250 (U/L)   URIC ACID     Status: Normal   Collection Time   12/09/11  6:50 PM      Component Value Range Comment   Uric Acid, Serum 4.1  2.4 - 7.0 (mg/dL)      Assessment and Plan  Care of pt  turned over to Georges Mouse, CNM.  Clinton Gallant. Rice III, DrHSc, MPAS, PA-C  12/09/2011, 7:00 PM   Henrietta Hoover, PA 12/09/11 1949  Pt continued to c/o contractions after 1 dose of terbutaline. Cervix closed/50%/high. Per Dr. Arelia Sneddon, 2nd dose  of terb given. Pt reports relief greater than 1 hour after 2nd dose of terb.   A/P: 36 y.o. G7P0060 at [redacted]w[redacted]d PIH panel WNL Preterm contractions resolved with terb, no signs of active labor D/C home with precautions, f/u Thursday as scheduled

## 2011-12-09 NOTE — Progress Notes (Signed)
Patient states she has been having some heartburn but since last night has been severe and was unable to relieve. Has had a headache since last night and has had increased swelling today. Has had spots before her eyes. Reports good fetal movement, no bleeding or leaking.

## 2011-12-22 ENCOUNTER — Inpatient Hospital Stay (HOSPITAL_COMMUNITY)
Admission: AD | Admit: 2011-12-22 | Discharge: 2011-12-22 | Disposition: A | Discharge status: Home / Self Care | Payer: BC Managed Care – PPO | Source: Ambulatory Visit | Attending: Obstetrics and Gynecology | Admitting: Obstetrics and Gynecology

## 2011-12-22 ENCOUNTER — Encounter (HOSPITAL_COMMUNITY): Payer: Self-pay | Admitting: *Deleted

## 2011-12-22 DIAGNOSIS — O4700 False labor before 37 completed weeks of gestation, unspecified trimester: Secondary | ICD-10-CM | POA: Insufficient documentation

## 2011-12-22 MED ORDER — TERBUTALINE SULFATE 1 MG/ML IJ SOLN
0.2500 mg | Freq: Once | INTRAMUSCULAR | Status: AC
  Administered 2011-12-22: 0.25 mg via SUBCUTANEOUS
  Filled 2011-12-22: qty 1

## 2011-12-22 NOTE — MAU Note (Signed)
States feels nauseated and vomited this morning. States she had the stomach virus a couple of weeks ago. States cramping is more consistent today, passed mucous plug. Went to office. Dr. Vincente Poli checked her cervix and said it was still closed and firm. Sent her to MAU for further evaluation of contractions. States she just doesn't feel well.

## 2011-12-22 NOTE — MAU Provider Note (Signed)
  History     CSN: [id number]  Arrival date and time: 12/22/11 1204   First Provider Initiated Contact with Patient 12/22/11 1235      Chief Complaint  Patient presents with  . Contractions   HPI Linda Vargas is 36 y.o. G7P0060 [redacted]w[redacted]d weeks presenting with ?contractions.   Was seen in the office this morning by Dr. Vincente Poli sent here for monitoring.  Reports "discomfort" over the last few days.  This am noticed ? Loss of mucous plug this am.  Increase pain in lower back and  with ? Tightness.  " A little vaginal spotting".  Told today in the office that cervix was closed.     Past Medical History  Diagnosis Date  . Hypertension   . Seasonal allergic rhinitis   . Lupus   . Anxiety     Past Surgical History  Procedure Date  . Dilation and curettage of uterus 2011  . Foot surgery   . Cholecystectomy 2012  . Tonsillectomy     Family History  Problem Relation Age of Onset  . Hypertension Mother   . Hypertension Father   . Diabetes Father   . Heart disease Maternal Grandmother   . Heart disease Paternal Grandmother     History  Substance Use Topics  . Smoking status: Never Smoker   . Smokeless tobacco: Never Used  . Alcohol Use: No    Allergies:  Allergies  Allergen Reactions  . Bean Pod Extract Hives  . Corn-Containing Products Hives  . Ibuprofen Swelling    Lips swell  . Peanut-Containing Drug Products Hives    Prescriptions prior to admission  Medication Sig Dispense Refill  . acetaminophen (TYLENOL) 325 MG tablet Take 650 mg by mouth every 6 (six) hours as needed. For headaches       . cetirizine (ZYRTEC) 10 MG tablet Take 10 mg by mouth daily.        . famotidine (PEPCID) 20 MG tablet Take 1 tablet (20 mg total) by mouth 2 (two) times daily.  60 tablet  3  . hydroxychloroquine (PLAQUENIL) 200 MG tablet Take 200 mg by mouth 2 (two) times daily.        Marland Kitchen labetalol (NORMODYNE) 100 MG tablet Take 100 mg by mouth 2 (two) times daily.        . Prenatal  Vit-Fe Fumarate-FA (PRENATAL MULTIVITAMIN) TABS Take 1 tablet by mouth daily.        Review of Systems  Gastrointestinal:       + contractions  Musculoskeletal: Positive for back pain (lower).   Physical Exam   Blood pressure 139/75, pulse 68, temperature 96.8 F (36 C), temperature source Oral, resp. rate 18, height 5\' 10"  (1.778 m), weight 228 lb (103.42 kg), last menstrual period 05/01/2011.  Physical Exam  Constitutional: She is oriented to person, place, and time. She appears well-developed and well-nourished. No distress.  HENT:  Head: Normocephalic.  Neck: Normal range of motion.  GI:       Gravid, nontender  Genitourinary:       Cervical exam done this am in the office, not repeated.  Neurological: She is alert and oriented to person, place, and time.  Skin: Skin is warm and dry.    MAU Course  Procedures  MDM Dr. Vincente Poli called, called back and gave orders for Terbutaline to S. Gabriel Earing, RN  Assessment and Plan    Matt Holmes 12/22/2011, 12:35 PM

## 2011-12-25 ENCOUNTER — Inpatient Hospital Stay (HOSPITAL_COMMUNITY)
Admission: AD | Admit: 2011-12-25 | Discharge: 2011-12-27 | DRG: 886 | Disposition: A | Discharge status: Home / Self Care | Payer: BC Managed Care – PPO | Source: Ambulatory Visit | Attending: Obstetrics and Gynecology | Admitting: Obstetrics and Gynecology

## 2011-12-25 ENCOUNTER — Encounter (HOSPITAL_COMMUNITY): Payer: Self-pay | Admitting: *Deleted

## 2011-12-25 DIAGNOSIS — M329 Systemic lupus erythematosus, unspecified: Secondary | ICD-10-CM | POA: Diagnosis present

## 2011-12-25 DIAGNOSIS — O10019 Pre-existing essential hypertension complicating pregnancy, unspecified trimester: Principal | ICD-10-CM | POA: Diagnosis present

## 2011-12-25 DIAGNOSIS — O99891 Other specified diseases and conditions complicating pregnancy: Secondary | ICD-10-CM | POA: Diagnosis present

## 2011-12-25 LAB — COMPREHENSIVE METABOLIC PANEL
ALT: 9 U/L (ref 0–35)
AST: 14 U/L (ref 0–37)
Albumin: 2.5 g/dL — ABNORMAL LOW (ref 3.5–5.2)
Alkaline Phosphatase: 90 U/L (ref 39–117)
BUN: 7 mg/dL (ref 6–23)
CO2: 23 mEq/L (ref 19–32)
Calcium: 9.5 mg/dL (ref 8.4–10.5)
Chloride: 103 mEq/L (ref 96–112)
Creatinine, Ser: 1.01 mg/dL (ref 0.50–1.10)
GFR calc Af Amer: 83 mL/min — ABNORMAL LOW (ref >90)
GFR calc non Af Amer: 72 mL/min — ABNORMAL LOW (ref >90)
Glucose, Bld: 72 mg/dL (ref 70–99)
Potassium: 3.7 mEq/L (ref 3.5–5.1)
Sodium: 133 mEq/L — ABNORMAL LOW (ref 135–145)
Total Bilirubin: 0.2 mg/dL — ABNORMAL LOW (ref 0.3–1.2)
Total Protein: 6 g/dL (ref 6.0–8.3)

## 2011-12-25 LAB — CBC
HCT: 30.4 % — ABNORMAL LOW (ref 36.0–46.0)
Hemoglobin: 10.3 g/dL — ABNORMAL LOW (ref 12.0–15.0)
MCH: 28.3 pg (ref 26.0–34.0)
MCHC: 33.9 g/dL (ref 30.0–36.0)
MCV: 83.5 fL (ref 78.0–100.0)
Platelets: 326 10*3/uL (ref 150–400)
RBC: 3.64 MIL/uL — ABNORMAL LOW (ref 3.87–5.11)
RDW: 12.2 % (ref 11.5–15.5)
WBC: 10.7 10*3/uL — ABNORMAL HIGH (ref 4.0–10.5)

## 2011-12-25 LAB — LACTATE DEHYDROGENASE: LDH: 172 U/L (ref 94–250)

## 2011-12-25 LAB — URIC ACID: Uric Acid, Serum: 3.9 mg/dL (ref 2.4–7.0)

## 2011-12-25 MED ORDER — DOCUSATE SODIUM 100 MG PO CAPS
100.0000 mg | ORAL_CAPSULE | Freq: Every day | ORAL | Status: DC
  Administered 2011-12-26 – 2011-12-27 (×2): 100 mg via ORAL
  Filled 2011-12-25 (×2): qty 1

## 2011-12-25 MED ORDER — PRENATAL MULTIVITAMIN CH
1.0000 | ORAL_TABLET | Freq: Every day | ORAL | Status: DC
  Administered 2011-12-26 – 2011-12-27 (×2): 1 via ORAL
  Filled 2011-12-25 (×2): qty 1

## 2011-12-25 MED ORDER — CALCIUM CARBONATE ANTACID 500 MG PO CHEW: 2.0000 | CHEWABLE_TABLET | ORAL | Status: DC | PRN

## 2011-12-25 MED ORDER — FAMOTIDINE 20 MG PO TABS
20.0000 mg | ORAL_TABLET | Freq: Two times a day (BID) | ORAL | Status: DC
  Administered 2011-12-25 – 2011-12-27 (×4): 20 mg via ORAL
  Filled 2011-12-25 (×4): qty 1

## 2011-12-25 MED ORDER — ACETAMINOPHEN 325 MG PO TABS
650.0000 mg | ORAL_TABLET | ORAL | Status: DC | PRN
  Administered 2011-12-26 (×3): 650 mg via ORAL
  Filled 2011-12-25 (×3): qty 2

## 2011-12-25 MED ORDER — ZOLPIDEM TARTRATE 10 MG PO TABS
10.0000 mg | ORAL_TABLET | Freq: Every evening | ORAL | Status: DC | PRN
  Administered 2011-12-26: 10 mg via ORAL
  Filled 2011-12-25: qty 1

## 2011-12-25 MED ORDER — BETAMETHASONE SOD PHOS & ACET 6 (3-3) MG/ML IJ SUSP
12.0000 mg | INTRAMUSCULAR | Status: AC
  Administered 2011-12-25 – 2011-12-26 (×2): 12 mg via INTRAMUSCULAR
  Filled 2011-12-25 (×2): qty 2

## 2011-12-25 MED ORDER — HYDROXYCHLOROQUINE SULFATE 200 MG PO TABS
200.0000 mg | ORAL_TABLET | Freq: Two times a day (BID) | ORAL | Status: DC
  Administered 2011-12-26 – 2011-12-27 (×4): 200 mg via ORAL
  Filled 2011-12-25 (×6): qty 1

## 2011-12-25 MED ORDER — LABETALOL HCL 100 MG PO TABS
100.0000 mg | ORAL_TABLET | Freq: Two times a day (BID) | ORAL | Status: DC
  Administered 2011-12-26 – 2011-12-27 (×4): 100 mg via ORAL
  Filled 2011-12-25 (×6): qty 1

## 2011-12-25 NOTE — H&P (Signed)
Linda Vargas is a 36 y.o. female presenting for C/O headache today, severe, with floaters in vision but no blurry vision. No epigastric pain. Pregnancy complicated by [facility] on labetalol and lupus on Plaquenil. Today in office BP was elevated, 2+ protein in urine. U/S showed good growth/Afi and BPP of 8/8. Maternal Medical History:  Fetal activity: Perceived fetal activity is normal.    Prenatal complications: Hypertension.     OB History    Grav Para Term Preterm Abortions TAB SAB Ect Mult Living   7 0 0 0 6 3 3    0     Past Medical History  Diagnosis Date  . Hypertension   . Seasonal allergic rhinitis   . Lupus   . Anxiety    Past Surgical History  Procedure Date  . Dilation and curettage of uterus 2011  . Foot surgery   . Cholecystectomy 2012  . Tonsillectomy    Family History: family history includes Diabetes in her father; Heart disease in her maternal grandmother and paternal grandmother; and Hypertension in her father and mother. Social History:  reports that she has never smoked. She has never used smokeless tobacco. She reports that she does not drink alcohol or use illicit drugs.  Review of Systems  HENT:       History of intermittent migraine type headaches. Had headache today. C/O floaters in vision but no blurry vision   Gastrointestinal: Negative for abdominal pain.  Neurological: Positive for headaches.      Blood pressure 134/78, pulse 69, temperature 98.9 F (37.2 C), temperature source Oral, resp. rate 18, height 5\' 10"  (1.778 m), weight 102.967 kg (227 lb), last menstrual period 05/01/2011.   Fetal Exam Fetal Monitor Review: Pattern: accelerations present.       Physical Exam  Constitutional: She appears well-developed and well-nourished.  Cardiovascular: Normal rate and regular rhythm.   Respiratory: Effort normal and breath sounds normal.  GI: There is no tenderness.  Neurological: She has normal reflexes.    Prenatal labs: ABO, Rh:     Antibody:   Rubella:   RPR:    HBsAg:    HIV:    GBS:     Assessment/Plan: 36 yo G7 P0 at 76 0/7 weeks with CHTN and lupus with elevated BP, proteinuria and HA. HA better now and BPs OK at bedrest. Admit, MBR with EFM tonight. Will check PIH labs tonight and begin 24 hour urine collection. Betamethasone ordered. Continue labetalol and plaquenil.   Linda Vargas,Linda Vargas 12/25/2011, 10:32 PM

## 2011-12-26 ENCOUNTER — Encounter (HOSPITAL_COMMUNITY): Payer: Self-pay | Admitting: *Deleted

## 2011-12-26 NOTE — Progress Notes (Signed)
UR Chart review completed.  

## 2011-12-26 NOTE — Progress Notes (Signed)
Reports HA improved w/ tylenol.  Good FM.  No ctx, vb or lof.  No n/v or RUQ pain.  AF, VSS  BP 120-140/80/90s + FHT, reassuring Gen - NAD Abd - gravid, NT Ext - trace edema, DTR 1+, no clonus PV - deferred.  A/P:  CHTN, Lupus - admit for bedrest and pre-e evaluation Continue bedrest 24 hr urine in progress Labs stable

## 2011-12-27 LAB — CREATININE CLEARANCE, URINE, 24 HOUR
Collection Interval-CRCL: 24 hours
Creatinine Clearance: 82 mL/min (ref 75–115)
Creatinine, 24H Ur: 1199 mg/d (ref 700–1800)
Creatinine, Urine: 157.79 mg/dL
Creatinine: 1.01 mg/dL (ref 0.50–1.10)
Urine Total Volume-CRCL: 760 mL

## 2011-12-27 LAB — PROTEIN, URINE, 24 HOUR
Collection Interval-UPROT: 24 hours
Protein, 24H Urine: 106 mg/d — ABNORMAL HIGH (ref 50–100)
Protein, Urine: 14 mg/dL
Urine Total Volume-UPROT: 760 mL

## 2011-12-27 NOTE — Discharge Summary (Signed)
Physician Discharge Summary  Patient ID: Linda Vargas MRN: [id number] DOB/AGE: 01/06/1976 36 y.o.  Admit date: 12/25/2011 Discharge date: 12/27/2011  Admission Diagnoses:  SLE, CHTN, pregnancy  Discharge Diagnoses:  SLE, CHTN, pregnancy  Discharged Condition: good  Hospital Course: Pt was admitted for serial BP monitoring, fetal testing, bedrest and 24 hour urine collection.  Labs remained wnl and BP stable w/ bedrest.  24 urine returned 300mg /day and pt felt stable for discharge home on bedrest.    Consults: None  Significant Diagnostic Studies: labs: CBC, CMET, 24 hr urine collection  Treatments: cardiac meds: labetolol and steroids: betamethasone  Discharge Exam: Blood pressure 120/80, pulse 89, temperature 97.9 F (36.6 C), temperature source Oral, resp. rate 20, height 5\' 10"  (1.778 m), weight 100.84 kg (222 lb 5 oz), last menstrual period 05/01/2011, SpO2 98.00%. See today's progress note  Disposition: 01-Home or Self Care.  Bedrest w/ bathroom privelages  Discharge Orders    Future Orders Please Complete By Expires   HIV antibody      Comments:   This external order was created through the Results Console.   Rubella antibody, IgM      Comments:   This external order was created through the Results Console.   Hepatitis B surface antigen      Comments:   This external order was created through the Results Console.   RPR      Comments:   This external order was created through the Results Console.   Antibody screen      Comments:   This external order was created through the Results Console.     Medication List  As of 12/27/2011 11:10 AM   STOP taking these medications         famotidine 20 MG tablet         TAKE these medications         acetaminophen 325 MG tablet   Commonly known as: TYLENOL   Take 650 mg by mouth every 6 (six) hours as needed. For headaches      cetirizine 10 MG tablet   Commonly known as: ZYRTEC   Take 10 mg by mouth daily.        hydroxychloroquine 200 MG tablet   Commonly known as: PLAQUENIL   Take 200 mg by mouth 2 (two) times daily.      labetalol 100 MG tablet   Commonly known as: NORMODYNE   Take 100 mg by mouth 2 (two) times daily.      pantoprazole 40 MG tablet   Commonly known as: PROTONIX   Take 40 mg by mouth every morning.      prenatal vitamin w/FE, FA 29-1 MG Chew   Chew 2 tablets by mouth every morning.           Follow-up Information    Schedule an appointment as soon as possible for a visit in 1 week to follow up.         Signed: Yuliana Vandrunen 12/27/2011, 11:10 AM

## 2011-12-27 NOTE — Discharge Instructions (Signed)

## 2011-12-27 NOTE — Progress Notes (Signed)
D/C'd home c her father.  Voiced understanding of all instructions.  Note given for her employer to document her absence.  Taken to car via W/C by [employer], OBT.  Left in stable cond s s/s of acute distress.  (permission given by Dr [name] for pt to have pedicure)

## 2011-12-27 NOTE — Progress Notes (Signed)
No complaints.  Mild HA that improves w/ tylenol.  No RUQ pain, n/v or visual changes.  + FM  AF, VSS + FHT Gen - NAD Abd - gravid, nt  A/P:  Await 24 hr urine results

## 2012-01-05 LAB — CULTURE, BETA STREP (GROUP B ONLY): Organism ID, Bacteria: NEGATIVE

## 2012-01-05 LAB — STREP B DNA PROBE: GBS: NEGATIVE

## 2012-01-14 ENCOUNTER — Inpatient Hospital Stay (HOSPITAL_COMMUNITY)
Admission: AD | Admit: 2012-01-14 | Discharge: 2012-01-14 | Disposition: A | Discharge status: Home / Self Care | Payer: BC Managed Care – PPO | Source: Ambulatory Visit | Attending: Obstetrics and Gynecology | Admitting: Obstetrics and Gynecology

## 2012-01-14 ENCOUNTER — Encounter (HOSPITAL_COMMUNITY): Payer: Self-pay

## 2012-01-14 DIAGNOSIS — R51 Headache: Secondary | ICD-10-CM | POA: Insufficient documentation

## 2012-01-14 DIAGNOSIS — O99891 Other specified diseases and conditions complicating pregnancy: Secondary | ICD-10-CM | POA: Insufficient documentation

## 2012-01-14 DIAGNOSIS — O479 False labor, unspecified: Secondary | ICD-10-CM

## 2012-01-14 DIAGNOSIS — O4700 False labor before 37 completed weeks of gestation, unspecified trimester: Secondary | ICD-10-CM | POA: Insufficient documentation

## 2012-01-14 LAB — CBC
HCT: 33.8 % — ABNORMAL LOW (ref 36.0–46.0)
Hemoglobin: 11.3 g/dL — ABNORMAL LOW (ref 12.0–15.0)
MCH: 28.6 pg (ref 26.0–34.0)
MCHC: 33.4 g/dL (ref 30.0–36.0)
MCV: 85.6 fL (ref 78.0–100.0)
Platelets: 291 10*3/uL (ref 150–400)
RBC: 3.95 MIL/uL (ref 3.87–5.11)
RDW: 12.5 % (ref 11.5–15.5)
WBC: 8.9 10*3/uL (ref 4.0–10.5)

## 2012-01-14 LAB — COMPREHENSIVE METABOLIC PANEL
ALT: 11 U/L (ref 0–35)
AST: 17 U/L (ref 0–37)
Albumin: 2.7 g/dL — ABNORMAL LOW (ref 3.5–5.2)
Alkaline Phosphatase: 124 U/L — ABNORMAL HIGH (ref 39–117)
BUN: 5 mg/dL — ABNORMAL LOW (ref 6–23)
CO2: 22 mEq/L (ref 19–32)
Calcium: 9.3 mg/dL (ref 8.4–10.5)
Chloride: 101 mEq/L (ref 96–112)
Creatinine, Ser: 0.82 mg/dL (ref 0.50–1.10)
GFR calc Af Amer: >90 mL/min (ref >90)
GFR calc non Af Amer: >90 mL/min (ref >90)
Glucose, Bld: 72 mg/dL (ref 70–99)
Potassium: 4.2 mEq/L (ref 3.5–5.1)
Sodium: 131 mEq/L — ABNORMAL LOW (ref 135–145)
Total Bilirubin: 0.3 mg/dL (ref 0.3–1.2)
Total Protein: 6.8 g/dL (ref 6.0–8.3)

## 2012-01-14 LAB — LACTATE DEHYDROGENASE: LDH: 205 U/L (ref 94–250)

## 2012-01-14 MED ORDER — ACETAMINOPHEN 325 MG PO TABS
650.0000 mg | ORAL_TABLET | Freq: Once | ORAL | Status: AC
  Administered 2012-01-14: 650 mg via ORAL
  Filled 2012-01-14: qty 2

## 2012-01-14 NOTE — MAU Provider Note (Signed)
History     CSN: [id number]  Arrival date and time: 01/14/12 1120   First Provider Initiated Contact with Patient 01/14/12 1209      No chief complaint on file.  HPI Linda Vargas 36 y.o. [redacted]w[redacted]d  Sent from the office today for BP monitoring and PIH labs.  BP was elevated in the office.  Having contractions every 2-3 minutes.  Cervix was closed in the office.  Has a headache today and has not taken any medication.  OB History    Grav Para Term Preterm Abortions TAB SAB Ect Mult Living   3 0 0 0 2 1 1 0 0 0       Past Medical History  Diagnosis Date  . Hypertension   . Seasonal allergic rhinitis   . Lupus   . Anxiety     Past Surgical History  Procedure Date  . Dilation and curettage of uterus 2011  . Foot surgery   . Cholecystectomy 2012  . Tonsillectomy     Family History  Problem Relation Age of Onset  . Hypertension Mother   . Hypertension Father   . Diabetes Father   . Heart disease Maternal Grandmother   . Heart disease Paternal Grandmother   . Anesthesia problems Neg Hx     History  Substance Use Topics  . Smoking status: Never Smoker   . Smokeless tobacco: Never Used  . Alcohol Use: No    Allergies:  Allergies  Allergen Reactions  . Bean Pod Extract Hives  . Ibuprofen Swelling    Lips swell  . Peanut-Containing Drug Products Hives    Prescriptions prior to admission  Medication Sig Dispense Refill  . acetaminophen (TYLENOL) 325 MG tablet Take 650 mg by mouth every 6 (six) hours as needed. For headaches       . cetirizine (ZYRTEC) 10 MG tablet Take 10 mg by mouth daily.        . hydroxychloroquine (PLAQUENIL) 200 MG tablet Take 200 mg by mouth 2 (two) times daily.        Marland Kitchen labetalol (NORMODYNE) 100 MG tablet Take 100 mg by mouth 2 (two) times daily.        . pantoprazole (PROTONIX) 40 MG tablet Take 40 mg by mouth every morning.      . prenatal vitamin w/FE, FA (NATACHEW) 29-1 MG CHEW Chew 2 tablets by mouth every morning.        Review of  Systems  Gastrointestinal:       Some abdominal cramping  Genitourinary:       No leaking.  No vaginal bleeding  Neurological: Positive for headaches.   Physical Exam   Blood pressure 124/86, pulse 75, temperature 97.7 F (36.5 C), temperature source Oral, height 5\' 10"  (1.778 m), weight 228 lb (103.42 kg), last menstrual period 05/01/2011.  Physical Exam  Nursing note and vitals reviewed. Constitutional: She is oriented to person, place, and time. She appears well-developed and well-nourished.  HENT:  Head: Normocephalic.  Eyes: EOM are normal.  Neck: Neck supple.  GI: Soft.       On fetal monitor FHT baseline 135.  15x15 accelerations noted - reactive Contractions q 2-3 minutes, mild in severity  Musculoskeletal: Normal range of motion.  Neurological: She is alert and oriented to person, place, and time.  Skin: Skin is warm and dry.  Psychiatric: She has a normal mood and affect.    MAU Course  Procedures Blood pressures have been stable and normal  MDM Results for  orders placed during the hospital encounter of 01/14/12 (from the past 24 hour(s))  COMPREHENSIVE METABOLIC PANEL     Status: Abnormal   Collection Time   01/14/12 12:30 PM      Component Value Range   Sodium 131 (*) 135 - 145 (mEq/L)   Potassium 4.2  3.5 - 5.1 (mEq/L)   Chloride 101  96 - 112 (mEq/L)   CO2 22  19 - 32 (mEq/L)   Glucose, Bld 72  70 - 99 (mg/dL)   BUN 5 (*) 6 - 23 (mg/dL)   Creatinine, Ser 1.61  0.50 - 1.10 (mg/dL)   Calcium 9.3  8.4 - 09.6 (mg/dL)   Total Protein 6.8  6.0 - 8.3 (g/dL)   Albumin 2.7 (*) 3.5 - 5.2 (g/dL)   AST 17  0 - 37 (U/L)   ALT 11  0 - 35 (U/L)   Alkaline Phosphatase 124 (*) 39 - 117 (U/L)   Total Bilirubin 0.3  0.3 - 1.2 (mg/dL)   GFR calc non Af Amer >90  >90 (mL/min)   GFR calc Af Amer >90  >90 (mL/min)  CBC     Status: Abnormal   Collection Time   01/14/12 12:30 PM      Component Value Range   WBC 8.9  4.0 - 10.5 (K/uL)   RBC 3.95  3.87 - 5.11 (MIL/uL)    Hemoglobin 11.3 (*) 12.0 - 15.0 (g/dL)   HCT 04.5 (*) 40.9 - 46.0 (%)   MCV 85.6  78.0 - 100.0 (fL)   MCH 28.6  26.0 - 34.0 (pg)   MCHC 33.4  30.0 - 36.0 (g/dL)   RDW 81.1  91.4 - 78.2 (%)   Platelets 291  150 - 400 (K/uL)  LACTATE DEHYDROGENASE     Status: Normal   Collection Time   01/14/12 12:30 PM      Component Value Range   LD 205  94 - 250 (U/L)   1420 reviewed plan of care with Dr. Arelia Sneddon  Assessment and Plan  Pregnancy 36 weeks Headache Ruled out [facility]  Plan Follow up in the office on Friday Drink at least 8 8-oz glasses of water every day. Take Tylenol 325 mg 2 tablets by mouth every 4 hours if needed for pain. Call your doctor if your symptoms worsen. Expect the baby to move every day.  Teonia Yager 01/14/2012, 2:23 PM

## 2012-01-14 NOTE — Discharge Instructions (Signed)
Follow up in the office on Friday Drink at least 8 8-oz glasses of water every day. Take Tylenol 325 mg 2 tablets by mouth every 4 hours if needed for pain. Call your doctor if your symptoms worsen. Expect the baby to move every day.

## 2012-01-14 NOTE — MAU Note (Signed)
Patient sent from the office for evaluation of elevated BP.

## 2012-01-16 ENCOUNTER — Encounter (HOSPITAL_COMMUNITY): Payer: Self-pay

## 2012-01-16 ENCOUNTER — Inpatient Hospital Stay (HOSPITAL_COMMUNITY)
Admission: AD | Admit: 2012-01-16 | Discharge: 2012-01-20 | DRG: 372 | Disposition: A | Discharge status: Home / Self Care | Payer: BC Managed Care – PPO | Source: Ambulatory Visit | Attending: Obstetrics and Gynecology | Admitting: Obstetrics and Gynecology

## 2012-01-16 DIAGNOSIS — IMO0002 Reserved for concepts with insufficient information to code with codable children: Principal | ICD-10-CM | POA: Diagnosis present

## 2012-01-16 DIAGNOSIS — N96 Recurrent pregnancy loss: Secondary | ICD-10-CM

## 2012-01-16 DIAGNOSIS — O9903 Anemia complicating the puerperium: Secondary | ICD-10-CM | POA: Diagnosis not present

## 2012-01-16 DIAGNOSIS — O99892 Other specified diseases and conditions complicating childbirth: Secondary | ICD-10-CM | POA: Diagnosis present

## 2012-01-16 DIAGNOSIS — O0990 Supervision of high risk pregnancy, unspecified, unspecified trimester: Secondary | ICD-10-CM

## 2012-01-16 DIAGNOSIS — M329 Systemic lupus erythematosus, unspecified: Secondary | ICD-10-CM | POA: Diagnosis present

## 2012-01-16 DIAGNOSIS — N898 Other specified noninflammatory disorders of vagina: Secondary | ICD-10-CM

## 2012-01-16 DIAGNOSIS — O4690 Antepartum hemorrhage, unspecified, unspecified trimester: Secondary | ICD-10-CM

## 2012-01-16 DIAGNOSIS — D649 Anemia, unspecified: Secondary | ICD-10-CM | POA: Diagnosis not present

## 2012-01-16 DIAGNOSIS — I10 Essential (primary) hypertension: Secondary | ICD-10-CM | POA: Diagnosis present

## 2012-01-16 LAB — URINALYSIS, ROUTINE W REFLEX MICROSCOPIC
Bilirubin Urine: NEGATIVE
Glucose, UA: NEGATIVE mg/dL
Hgb urine dipstick: NEGATIVE
Ketones, ur: NEGATIVE mg/dL
Leukocytes, UA: NEGATIVE
Nitrite: NEGATIVE
Protein, ur: NEGATIVE mg/dL
Specific Gravity, Urine: >1.03 — ABNORMAL HIGH (ref 1.005–1.030)
Urobilinogen, UA: 0.2 mg/dL (ref 0.0–1.0)
pH: 6 (ref 5.0–8.0)

## 2012-01-16 LAB — COMPREHENSIVE METABOLIC PANEL
ALT: 10 U/L (ref 0–35)
AST: 16 U/L (ref 0–37)
Albumin: 2.6 g/dL — ABNORMAL LOW (ref 3.5–5.2)
Alkaline Phosphatase: 123 U/L — ABNORMAL HIGH (ref 39–117)
BUN: 8 mg/dL (ref 6–23)
CO2: 21 mEq/L (ref 19–32)
Calcium: 9.1 mg/dL (ref 8.4–10.5)
Chloride: 102 mEq/L (ref 96–112)
Creatinine, Ser: 0.86 mg/dL (ref 0.50–1.10)
GFR calc Af Amer: >90 mL/min (ref >90)
GFR calc non Af Amer: 87 mL/min — ABNORMAL LOW (ref >90)
Glucose, Bld: 79 mg/dL (ref 70–99)
Potassium: 3.7 mEq/L (ref 3.5–5.1)
Sodium: 133 mEq/L — ABNORMAL LOW (ref 135–145)
Total Bilirubin: 0.2 mg/dL — ABNORMAL LOW (ref 0.3–1.2)
Total Protein: 6.1 g/dL (ref 6.0–8.3)

## 2012-01-16 LAB — CBC
HCT: 32.2 % — ABNORMAL LOW (ref 36.0–46.0)
Hemoglobin: 10.9 g/dL — ABNORMAL LOW (ref 12.0–15.0)
MCH: 28.4 pg (ref 26.0–34.0)
MCHC: 33.9 g/dL (ref 30.0–36.0)
MCV: 83.9 fL (ref 78.0–100.0)
Platelets: 298 10*3/uL (ref 150–400)
RBC: 3.84 MIL/uL — ABNORMAL LOW (ref 3.87–5.11)
RDW: 12.3 % (ref 11.5–15.5)
WBC: 10.7 10*3/uL — ABNORMAL HIGH (ref 4.0–10.5)

## 2012-01-16 LAB — LACTATE DEHYDROGENASE: LDH: 194 U/L (ref 94–250)

## 2012-01-16 LAB — URIC ACID: Uric Acid, Serum: 4.4 mg/dL (ref 2.4–7.0)

## 2012-01-16 MED ORDER — ONDANSETRON HCL 4 MG/2ML IJ SOLN
4.0000 mg | Freq: Four times a day (QID) | INTRAMUSCULAR | Status: DC | PRN
  Administered 2012-01-17: 4 mg via INTRAVENOUS
  Filled 2012-01-16: qty 2

## 2012-01-16 MED ORDER — LACTATED RINGERS IV SOLN: 500.0000 mL | INTRAVENOUS | Status: DC | PRN

## 2012-01-16 MED ORDER — MAGNESIUM SULFATE BOLUS VIA INFUSION
4.0000 g | Freq: Once | INTRAVENOUS | Status: AC
  Administered 2012-01-16: 4 g via INTRAVENOUS
  Filled 2012-01-16: qty 500

## 2012-01-16 MED ORDER — CITRIC ACID-SODIUM CITRATE 334-500 MG/5ML PO SOLN
30.0000 mL | ORAL | Status: DC | PRN
  Administered 2012-01-17: 30 mL via ORAL
  Filled 2012-01-16: qty 15

## 2012-01-16 MED ORDER — PENICILLIN G POTASSIUM 5000000 UNITS IJ SOLR
2.5000 10*6.[IU] | INTRAVENOUS | Status: DC
  Filled 2012-01-16: qty 2.5

## 2012-01-16 MED ORDER — LIDOCAINE HCL (PF) 1 % IJ SOLN
30.0000 mL | INTRAMUSCULAR | Status: DC | PRN
  Filled 2012-01-16: qty 30

## 2012-01-16 MED ORDER — OXYCODONE-ACETAMINOPHEN 5-325 MG PO TABS
2.0000 | ORAL_TABLET | Freq: Once | ORAL | Status: AC
  Administered 2012-01-16: 2 via ORAL
  Filled 2012-01-16: qty 2

## 2012-01-16 MED ORDER — OXYTOCIN BOLUS FROM INFUSION
500.0000 mL | Freq: Once | INTRAVENOUS | Status: DC
  Administered 2012-01-17: 500 mL via INTRAVENOUS
  Filled 2012-01-16: qty 1000
  Filled 2012-01-16: qty 500

## 2012-01-16 MED ORDER — TERBUTALINE SULFATE 1 MG/ML IJ SOLN: 0.2500 mg | Freq: Once | INTRAMUSCULAR | Status: AC | PRN

## 2012-01-16 MED ORDER — OXYCODONE-ACETAMINOPHEN 5-325 MG PO TABS: 1.0000 | ORAL_TABLET | ORAL | Status: DC | PRN

## 2012-01-16 MED ORDER — ACETAMINOPHEN 325 MG PO TABS: 650.0000 mg | ORAL_TABLET | ORAL | Status: DC | PRN

## 2012-01-16 MED ORDER — MAGNESIUM SULFATE 40 G IN LACTATED RINGERS - SIMPLE
2.0000 g/h | INTRAVENOUS | Status: DC
  Administered 2012-01-17: 2 g/h via INTRAVENOUS
  Filled 2012-01-16 (×2): qty 500

## 2012-01-16 MED ORDER — FLEET ENEMA 7-19 GM/118ML RE ENEM: 1.0000 | ENEMA | RECTAL | Status: DC | PRN

## 2012-01-16 MED ORDER — LACTATED RINGERS IV SOLN
INTRAVENOUS | Status: DC
  Administered 2012-01-16 (×2): via INTRAVENOUS
  Administered 2012-01-17: 64 mL/h via INTRAVENOUS

## 2012-01-16 MED ORDER — OXYTOCIN 20 UNITS IN LACTATED RINGERS INFUSION - SIMPLE
1.0000 m[IU]/min | INTRAVENOUS | Status: DC
  Administered 2012-01-16: 1 m[IU]/min via INTRAVENOUS

## 2012-01-16 MED ORDER — OXYTOCIN 20 UNITS IN LACTATED RINGERS INFUSION - SIMPLE
125.0000 mL/h | Freq: Once | INTRAVENOUS | Status: AC
  Administered 2012-01-17: 125 mL/h via INTRAVENOUS
  Filled 2012-01-16: qty 1000

## 2012-01-16 MED ORDER — DEXTROSE 5 % IV SOLN
5.0000 10*6.[IU] | Freq: Once | INTRAVENOUS | Status: DC
  Filled 2012-01-16: qty 5

## 2012-01-16 NOTE — MAU Provider Note (Signed)
History     CSN: [id number]  Arrival date and time: 01/16/12 1939   None     Chief Complaint  Patient presents with  . Contractions  . Headache  . Hypertension   HPI 36 y.o. G3P0020 at [redacted]w[redacted]d. Headache x 2 days, BP 180/80 tonight at home, seeing lights/blurred vision, no relief with tylenol, + nausea, no RUQ pain. Diagnosed with preeclampsia this week. + fetal movement, + contractions, no vaginal bleeding or LOF.    Past Medical History  Diagnosis Date  . Hypertension   . Seasonal allergic rhinitis   . Lupus   . Anxiety   . Migraine     Past Surgical History  Procedure Date  . Dilation and curettage of uterus 2011  . Foot surgery   . Cholecystectomy 2012  . Tonsillectomy     Family History  Problem Relation Age of Onset  . Hypertension Mother   . Hypertension Father   . Diabetes Father   . Heart disease Maternal Grandmother   . Heart disease Paternal Grandmother   . Anesthesia problems Neg Hx     History  Substance Use Topics  . Smoking status: Never Smoker   . Smokeless tobacco: Never Used  . Alcohol Use: No    Allergies:  Allergies  Allergen Reactions  . Bean Pod Extract Hives  . Ibuprofen Swelling    Lips swell  . Peanut-Containing Drug Products Hives    Prescriptions prior to admission  Medication Sig Dispense Refill  . acetaminophen (TYLENOL) 500 MG tablet Take 1,000 mg by mouth every 6 (six) hours as needed.      . cetirizine (ZYRTEC) 10 MG tablet Take 10 mg by mouth daily.        . hydroxychloroquine (PLAQUENIL) 200 MG tablet Take 200 mg by mouth 2 (two) times daily.        Marland Kitchen labetalol (NORMODYNE) 100 MG tablet Take 100 mg by mouth 2 (two) times daily.        . pantoprazole (PROTONIX) 40 MG tablet Take 40 mg by mouth every morning.      . prenatal vitamin w/FE, FA (NATACHEW) 29-1 MG CHEW Chew 2 tablets by mouth every morning.        Review of Systems  Constitutional: Negative.   Eyes: Positive for blurred vision.  Respiratory: Negative  for shortness of breath.   Cardiovascular: Negative.  Negative for chest pain.  Gastrointestinal: Positive for nausea. Negative for vomiting, abdominal pain, diarrhea and constipation.  Genitourinary: Negative for dysuria, urgency, frequency, hematuria and flank pain.       Negative for vaginal bleeding, Positive for cramping/contractions  Musculoskeletal: Negative.   Neurological: Positive for headaches.  Psychiatric/Behavioral: Negative.    Physical Exam   Blood pressure 147/95, pulse 81, temperature 97.9 F (36.6 C), temperature source Oral, resp. rate 20, height 5' 7.25" (1.708 m), weight 230 lb 6 oz (104.497 kg), last menstrual period 05/01/2011, SpO2 99.00%.  Physical Exam  Nursing note and vitals reviewed. Constitutional: She is oriented to person, place, and time. She appears well-developed and well-nourished. She appears distressed.  HENT:  Head: Normocephalic and atraumatic.  Neck: Normal range of motion.  Cardiovascular: Normal rate and regular rhythm.   Respiratory: Effort normal. No respiratory distress.  GI: Soft. She exhibits no distension and no mass. There is no tenderness. There is no rebound and no guarding.  Musculoskeletal: Normal range of motion.  Neurological: She is alert and oriented to person, place, and time. She has normal reflexes.  Skin: Skin is warm and dry.  Psychiatric: She has a normal mood and affect.    MAU Course  Procedures  PIH labs and Percocet ordered in MAU  Assessment and Plan  36 y.o. G3P0020 at [redacted]w[redacted]d Preeclampsia - admit for observation per Dr. Cyndee Brightly 01/16/2012, 8:37 PM

## 2012-01-16 NOTE — MAU Note (Signed)
Pt states, " I'm having contractions ever 5 min and stronger and I've been to the doctor twice this week for hypertension. I was at the office today and they plan to induce me this coming week. I've had a headache since Wednesday and it almost feels like a migraine."

## 2012-01-16 NOTE — Progress Notes (Signed)
Patient is here with c/o more painful contractions for an hour q13m, she states that she has headache for days without relief from 1000mg  tylenol. She c/o blurred vision. No edema in her extremities. She reports good fetal movement, denies lof. She states that she has thin pink vaginal discahrge.

## 2012-01-16 NOTE — H&P (Addendum)
36 yo G3P2 @ 37+1 weeks presents w/ severe HA and contractions.  No lof or vb.  No RUQ pain or n/v.  Reports ctx Q1min.  + FM  PMHx:  SLE, CHTN PSHx:  Cholecystectomy, tonsillectomy Meds:  Plaquinil, labetalol, PNV All:  Ibuprofen OBHx:  EAB x 1, SAB x 1  AF, VSS BP 140-150/90s  Gen - NAD, uncomfortable w/ HA Abd - gravid, NT Ext - no edema  Neuro  1+ DTR, no clonus Cvx 1cm per RN exam  A/P:  IUP at term with h/o Lupus and CHTN w/ superimposed pre-eclampsia Rec Admission Magnesium prophylaxis CBC, CMet, UA Early labor - will augment prn

## 2012-01-17 ENCOUNTER — Inpatient Hospital Stay (HOSPITAL_COMMUNITY): Payer: BC Managed Care – PPO | Admitting: Anesthesiology

## 2012-01-17 ENCOUNTER — Encounter (HOSPITAL_COMMUNITY): Payer: Self-pay | Admitting: *Deleted

## 2012-01-17 ENCOUNTER — Encounter (HOSPITAL_COMMUNITY): Payer: Self-pay | Admitting: Anesthesiology

## 2012-01-17 ENCOUNTER — Encounter (HOSPITAL_COMMUNITY)
Admission: AD | Disposition: A | Discharge status: Home / Self Care | Payer: Self-pay | Source: Ambulatory Visit | Attending: Obstetrics and Gynecology

## 2012-01-17 HISTORY — PX: DILATION AND CURETTAGE OF UTERUS: SHX78

## 2012-01-17 LAB — CBC
HCT: 32.3 % — ABNORMAL LOW (ref 36.0–46.0)
HCT: 23.1 % — ABNORMAL LOW (ref 36.0–46.0)
Hemoglobin: 10.7 g/dL — ABNORMAL LOW (ref 12.0–15.0)
Hemoglobin: 7.7 g/dL — ABNORMAL LOW (ref 12.0–15.0)
MCH: 28.1 pg (ref 26.0–34.0)
MCH: 28.1 pg (ref 26.0–34.0)
MCHC: 33.1 g/dL (ref 30.0–36.0)
MCHC: 33.3 g/dL (ref 30.0–36.0)
MCV: 84.8 fL (ref 78.0–100.0)
MCV: 84.3 fL (ref 78.0–100.0)
Platelets: 275 K/uL (ref 150–400)
Platelets: 244 10*3/uL (ref 150–400)
RBC: 3.81 MIL/uL — ABNORMAL LOW (ref 3.87–5.11)
RBC: 2.74 MIL/uL — ABNORMAL LOW (ref 3.87–5.11)
RDW: 12.4 % (ref 11.5–15.5)
RDW: 12.5 % (ref 11.5–15.5)
WBC: 9.3 K/uL (ref 4.0–10.5)
WBC: 18.5 10*3/uL — ABNORMAL HIGH (ref 4.0–10.5)

## 2012-01-17 LAB — DIC (DISSEMINATED INTRAVASCULAR COAGULATION) PANEL (NOT AT ARMC)
D-Dimer, Quant: 15.15 ug/mL-FEU — ABNORMAL HIGH (ref 0.00–0.48)
Fibrinogen: 395 mg/dL (ref 204–475)
INR: 1.05 (ref 0.00–1.49)
Platelets: 247 10*3/uL (ref 150–400)
Prothrombin Time: 13.9 seconds (ref 11.6–15.2)
aPTT: 26 seconds (ref 24–37)

## 2012-01-17 LAB — DIC (DISSEMINATED INTRAVASCULAR COAGULATION)PANEL: Smear Review: NONE SEEN

## 2012-01-17 LAB — RPR: RPR Ser Ql: NONREACTIVE

## 2012-01-17 SURGERY — DILATION AND CURETTAGE
Anesthesia: Epidural | Site: Vagina | Wound class: Clean Contaminated

## 2012-01-17 MED ORDER — BUTORPHANOL TARTRATE 2 MG/ML IJ SOLN
1.0000 mg | INTRAMUSCULAR | Status: DC | PRN
  Administered 2012-01-17 (×2): 1 mg via INTRAVENOUS
  Filled 2012-01-17 (×2): qty 1

## 2012-01-17 MED ORDER — EPHEDRINE 5 MG/ML INJ
10.0000 mg | INTRAVENOUS | Status: DC | PRN
  Filled 2012-01-17: qty 4

## 2012-01-17 MED ORDER — METHYLERGONOVINE MALEATE 0.2 MG/ML IJ SOLN
INTRAMUSCULAR | Status: DC | PRN
  Administered 2012-01-17: 0.2 mg via INTRAMUSCULAR

## 2012-01-17 MED ORDER — PANTOPRAZOLE SODIUM 40 MG PO TBEC
40.0000 mg | DELAYED_RELEASE_TABLET | Freq: Every morning | ORAL | Status: DC
  Administered 2012-01-17 – 2012-01-20 (×4): 40 mg via ORAL
  Filled 2012-01-17 (×6): qty 1

## 2012-01-17 MED ORDER — EPHEDRINE 5 MG/ML INJ: 10.0000 mg | INTRAVENOUS | Status: DC | PRN

## 2012-01-17 MED ORDER — PHENYLEPHRINE 40 MCG/ML (10ML) SYRINGE FOR IV PUSH (FOR BLOOD PRESSURE SUPPORT)
PREFILLED_SYRINGE | INTRAVENOUS | Status: AC
  Filled 2012-01-17: qty 5

## 2012-01-17 MED ORDER — HYDROXYCHLOROQUINE SULFATE 200 MG PO TABS
200.0000 mg | ORAL_TABLET | Freq: Two times a day (BID) | ORAL | Status: DC
  Administered 2012-01-17 – 2012-01-20 (×5): 200 mg via ORAL
  Filled 2012-01-17 (×9): qty 1

## 2012-01-17 MED ORDER — SODIUM CHLORIDE 0.9 % IV SOLN
INTRAVENOUS | Status: DC | PRN
  Administered 2012-01-17: 23:00:00 via INTRAVENOUS

## 2012-01-17 MED ORDER — CLINDAMYCIN PHOSPHATE 900 MG/50ML IV SOLN
900.0000 mg | Freq: Three times a day (TID) | INTRAVENOUS | Status: DC
  Administered 2012-01-18: 900 mg via INTRAVENOUS
  Filled 2012-01-17 (×4): qty 50

## 2012-01-17 MED ORDER — LIDOCAINE HCL (PF) 1 % IJ SOLN
INTRAMUSCULAR | Status: DC | PRN
  Administered 2012-01-17 (×2): 4 mL

## 2012-01-17 MED ORDER — DIPHENHYDRAMINE HCL 50 MG/ML IJ SOLN: 12.5000 mg | INTRAMUSCULAR | Status: DC | PRN

## 2012-01-17 MED ORDER — LIDOCAINE HCL (CARDIAC) 20 MG/ML IV SOLN
INTRAVENOUS | Status: AC
  Filled 2012-01-17: qty 5

## 2012-01-17 MED ORDER — EPHEDRINE 5 MG/ML INJ
INTRAVENOUS | Status: AC
  Filled 2012-01-17: qty 10

## 2012-01-17 MED ORDER — EPHEDRINE SULFATE 50 MG/ML IJ SOLN
INTRAMUSCULAR | Status: DC | PRN
  Administered 2012-01-17: 10 mg via INTRAVENOUS
  Administered 2012-01-17: 5 mg via INTRAVENOUS
  Administered 2012-01-17: 10 mg via INTRAVENOUS

## 2012-01-17 MED ORDER — MIDAZOLAM HCL 2 MG/2ML IJ SOLN
INTRAMUSCULAR | Status: AC
  Filled 2012-01-17: qty 2

## 2012-01-17 MED ORDER — ONDANSETRON HCL 4 MG/2ML IJ SOLN
INTRAMUSCULAR | Status: AC
  Filled 2012-01-17: qty 2

## 2012-01-17 MED ORDER — FENTANYL 2.5 MCG/ML BUPIVACAINE 1/10 % EPIDURAL INFUSION (WH - ANES)
14.0000 mL/h | INTRAMUSCULAR | Status: DC
  Administered 2012-01-17 (×2): 14 mL/h via EPIDURAL
  Filled 2012-01-17 (×3): qty 60

## 2012-01-17 MED ORDER — MEPERIDINE HCL 25 MG/ML IJ SOLN: 6.2500 mg | INTRAMUSCULAR | Status: DC | PRN

## 2012-01-17 MED ORDER — LACTATED RINGERS IV SOLN: 500.0000 mL | Freq: Once | INTRAVENOUS | Status: DC

## 2012-01-17 MED ORDER — LACTATED RINGERS IV SOLN
INTRAVENOUS | Status: DC | PRN
  Administered 2012-01-17 (×3): via INTRAVENOUS

## 2012-01-17 MED ORDER — FENTANYL 2.5 MCG/ML BUPIVACAINE 1/10 % EPIDURAL INFUSION (WH - ANES)
INTRAMUSCULAR | Status: DC | PRN
  Administered 2012-01-17: 14 mL/h via EPIDURAL

## 2012-01-17 MED ORDER — CEFAZOLIN SODIUM-DEXTROSE 1-4 GM-% IV SOLR
1.0000 g | Freq: Three times a day (TID) | INTRAVENOUS | Status: DC
  Administered 2012-01-18: 1 g via INTRAVENOUS
  Filled 2012-01-17 (×4): qty 50

## 2012-01-17 MED ORDER — CLINDAMYCIN PHOSPHATE 600 MG/50ML IV SOLN
INTRAVENOUS | Status: DC | PRN
  Administered 2012-01-17: 900 mg via INTRAVENOUS

## 2012-01-17 MED ORDER — PHENYLEPHRINE 40 MCG/ML (10ML) SYRINGE FOR IV PUSH (FOR BLOOD PRESSURE SUPPORT)
PREFILLED_SYRINGE | INTRAVENOUS | Status: AC
  Filled 2012-01-17: qty 10

## 2012-01-17 MED ORDER — COMPLETENATE 29-1 MG PO CHEW
2.0000 | CHEWABLE_TABLET | Freq: Every morning | ORAL | Status: DC
  Administered 2012-01-17 – 2012-01-18 (×2): 2 via ORAL
  Administered 2012-01-19: 1 via ORAL
  Administered 2012-01-20: 2 via ORAL
  Filled 2012-01-17 (×6): qty 2

## 2012-01-17 MED ORDER — PROPOFOL 10 MG/ML IV EMUL
INTRAVENOUS | Status: AC
  Filled 2012-01-17: qty 40

## 2012-01-17 MED ORDER — PHENYLEPHRINE 40 MCG/ML (10ML) SYRINGE FOR IV PUSH (FOR BLOOD PRESSURE SUPPORT)
80.0000 ug | PREFILLED_SYRINGE | INTRAVENOUS | Status: DC | PRN
  Filled 2012-01-17: qty 5

## 2012-01-17 MED ORDER — OXYTOCIN 10 UNIT/ML IJ SOLN
INTRAMUSCULAR | Status: AC
  Filled 2012-01-17: qty 2

## 2012-01-17 MED ORDER — PHENYLEPHRINE 40 MCG/ML (10ML) SYRINGE FOR IV PUSH (FOR BLOOD PRESSURE SUPPORT): 80.0000 ug | PREFILLED_SYRINGE | INTRAVENOUS | Status: DC | PRN

## 2012-01-17 MED ORDER — METOCLOPRAMIDE HCL 5 MG/ML IJ SOLN: 10.0000 mg | Freq: Once | INTRAMUSCULAR | Status: AC | PRN

## 2012-01-17 MED ORDER — SODIUM BICARBONATE 8.4 % IV SOLN
INTRAVENOUS | Status: DC | PRN
  Administered 2012-01-17: 4 mL via EPIDURAL

## 2012-01-17 MED ORDER — ONDANSETRON HCL 4 MG/2ML IJ SOLN
INTRAMUSCULAR | Status: DC | PRN
  Administered 2012-01-17: 4 mg via INTRAVENOUS

## 2012-01-17 MED ORDER — METHYLERGONOVINE MALEATE 0.2 MG/ML IJ SOLN
INTRAMUSCULAR | Status: AC
  Filled 2012-01-17: qty 1

## 2012-01-17 MED ORDER — FENTANYL CITRATE 0.05 MG/ML IJ SOLN: 25.0000 ug | INTRAMUSCULAR | Status: DC | PRN

## 2012-01-17 MED ORDER — FENTANYL CITRATE 0.05 MG/ML IJ SOLN
INTRAMUSCULAR | Status: AC
  Filled 2012-01-17: qty 2

## 2012-01-17 MED ORDER — CEFAZOLIN SODIUM 1-5 GM-% IV SOLN
INTRAVENOUS | Status: DC | PRN
  Administered 2012-01-17: 1 g via INTRAVENOUS

## 2012-01-17 MED ORDER — OXYTOCIN 20 UNITS IN LACTATED RINGERS INFUSION - SIMPLE: 1.0000 m[IU]/min | INTRAVENOUS | Status: DC

## 2012-01-17 MED ORDER — PHENYLEPHRINE HCL 10 MG/ML IJ SOLN
INTRAMUSCULAR | Status: DC | PRN
  Administered 2012-01-17: 120 ug via INTRAVENOUS
  Administered 2012-01-17 (×2): 80 ug via INTRAVENOUS
  Administered 2012-01-17: 40 ug via INTRAVENOUS
  Administered 2012-01-17: 120 ug via INTRAVENOUS
  Administered 2012-01-17 (×2): 80 ug via INTRAVENOUS
  Administered 2012-01-17: 40 ug via INTRAVENOUS
  Administered 2012-01-17: 80 ug via INTRAVENOUS

## 2012-01-17 MED ORDER — OXYTOCIN 10 UNIT/ML IJ SOLN
20.0000 [IU] | INTRAVENOUS | Status: DC | PRN
  Administered 2012-01-17: 20 [IU] via INTRAVENOUS

## 2012-01-17 SURGICAL SUPPLY — 23 items
BALLN POSTPARTUM SOS BAKRI (BALLOONS) ×3
BALLOON POSTPARTUM SOS BAKRI (BALLOONS) ×1 IMPLANT
CATH ROBINSON RED A/P 16FR (CATHETERS) ×5 IMPLANT
CLOTH BEACON ORANGE TIMEOUT ST (SAFETY) ×3 IMPLANT
DECANTER SPIKE VIAL GLASS SM (MISCELLANEOUS) ×1 IMPLANT
GLOVE BIO SURGEON STRL SZ 6.5 (GLOVE) ×3 IMPLANT
GLOVE BIOGEL PI IND STRL 7.0 (GLOVE) ×2 IMPLANT
GLOVE BIOGEL PI INDICATOR 7.0 (GLOVE) ×1
GOWN PREVENTION PLUS LG XLONG (DISPOSABLE) ×3 IMPLANT
GOWN STRL REIN XL XLG (GOWN DISPOSABLE) ×3 IMPLANT
KIT BERKELEY 1ST TRIMESTER 3/8 (MISCELLANEOUS) ×1 IMPLANT
NDL SPNL 22GX3.5 QUINCKE BK (NEEDLE) ×1 IMPLANT
NEEDLE SPNL 22GX3.5 QUINCKE BK (NEEDLE) IMPLANT
NS IRRIG 1000ML POUR BTL (IV SOLUTION) ×3 IMPLANT
PACK VAGINAL MINOR WOMEN LF (CUSTOM PROCEDURE TRAY) ×3 IMPLANT
PAD PREP 24X48 CUFFED NSTRL (MISCELLANEOUS) ×3 IMPLANT
SET BERKELEY SUCTION TUBING (SUCTIONS) ×3 IMPLANT
SYR CONTROL 10ML LL (SYRINGE) ×1 IMPLANT
TOWEL OR 17X24 6PK STRL BLUE (TOWEL DISPOSABLE) ×6 IMPLANT
VACURETTE 10 RIGID CVD (CANNULA) IMPLANT
VACURETTE 7MM CVD STRL WRAP (CANNULA) IMPLANT
VACURETTE 8 RIGID CVD (CANNULA) IMPLANT
VACURETTE 9 RIGID CVD (CANNULA) IMPLANT

## 2012-01-17 NOTE — Progress Notes (Signed)
To continue current POC and increase pitocin per orders, per Dr. [name]

## 2012-01-17 NOTE — Progress Notes (Signed)
Pt reports HA improved.  Feeling discomfort w/ ctx but stadol helped.  No vb.  No RUQ pain or visual changes.    AF, VSS  BP 130s/70-80s Gen - NAD Abd - gravid, NT Cvx 2/50/-2 AROM - clear  A/P:  Continue magnesium Pitocin Exp mngt

## 2012-01-17 NOTE — Anesthesia Procedure Notes (Signed)
Epidural Patient location during procedure: OB Start time: 01/17/2012 11:11 AM  Staffing Anesthesiologist: Devante Capano A. Performed by: anesthesiologist   Preanesthetic Checklist Completed: patient identified, site marked, surgical consent, pre-op evaluation, timeout performed, IV checked, risks and benefits discussed and monitors and equipment checked  Epidural Patient position: sitting Prep: site prepped and draped and DuraPrep Patient monitoring: continuous pulse ox and blood pressure Approach: midline Injection technique: LOR air  Needle:  Needle type: Tuohy  Needle gauge: 17 G Needle length: 9 cm Needle insertion depth: 7 cm Catheter type: closed end flexible Catheter size: 19 Gauge Catheter at skin depth: 12 cm Test dose: negative and Other  Assessment Events: blood not aspirated, injection not painful, no injection resistance, negative IV test and no paresthesia  Additional Notes Patient identified. Risks and benefits discussed including failed block, incomplete  Pain control, post dural puncture headache, nerve damage, paralysis, blood pressure Changes, nausea, vomiting, reactions to medications-both toxic and allergic and post Partum back pain. All questions were answered. Patient expressed understanding and wished to proceed. Sterile technique was used throughout procedure. Epidural site was Dressed with sterile barrier dressing. No paresthesias, signs of intravascular injection Or signs of intrathecal spread were encountered.  Patient was more comfortable after the epidural was dosed. Please see RN's note for documentation of vital signs and FHR which are stable.

## 2012-01-17 NOTE — Transfer of Care (Signed)
Immediate Anesthesia Transfer of Care Note  Patient: Linda Vargas  Procedure(s) Performed: Procedure(s) (LRB): DILATATION AND CURETTAGE (N/A)  Patient Location: PACU  Anesthesia Type: Epidural  Level of Consciousness: awake, alert  and oriented  Airway & Oxygen Therapy: Patient Spontanous Breathing  Post-op Assessment: Report given to PACU RN and Post -op Vital signs reviewed and stable  Post vital signs: stable  Complications: No apparent anesthesia complications

## 2012-01-17 NOTE — Progress Notes (Signed)
Pt uncomfortable w/ ctx, waiting for epidural.  FHT reassuring Toco Q1-4 Cvx 2/80/-2  A/P:  Continue pitocin, mag Exp mngt

## 2012-01-17 NOTE — Progress Notes (Signed)
Patient throwing up.

## 2012-01-17 NOTE — Progress Notes (Signed)
Dr. [name] called unit and was updated on patient status, no new orders at this time.

## 2012-01-17 NOTE — Anesthesia Preprocedure Evaluation (Addendum)
Anesthesia Evaluation  Patient identified by MRN, date of birth, ID band Patient awake    Reviewed: Allergy & Precautions, H&P , Patient's Chart, lab work & pertinent test results  Airway Mallampati: III TM Distance: >3 FB Neck ROM: full    Dental No notable dental hx. (+) Teeth Intact   Pulmonary neg pulmonary ROS,  breath sounds clear to auscultation  Pulmonary exam normal       Cardiovascular hypertension, Pt. on medications and Pt. on home beta blockers Rhythm:regular Rate:Normal     Neuro/Psych  Headaches, Anxiety Anxiety negative neurological ROS     GI/Hepatic Neg liver ROS, GERD-  Medicated and Controlled,  Endo/Other  negative endocrine ROS  Renal/GU negative Renal ROS  negative genitourinary   Musculoskeletal   Abdominal Normal abdominal exam  (+)   Peds  Hematology negative hematology ROS (+)   Anesthesia Other Findings   Reproductive/Obstetrics                          Anesthesia Physical Anesthesia Plan  ASA: III and Emergent  Anesthesia Plan: Epidural   Post-op Pain Management:    Induction:   Airway Management Planned:   Additional Equipment:   Intra-op Plan:   Post-operative Plan:   Informed Consent: I have reviewed the patients History and Physical, chart, labs and discussed the procedure including the risks, benefits and alternatives for the proposed anesthesia with the patient or authorized representative who has indicated his/her understanding and acceptance.     Plan Discussed with: Anesthesiologist, Surgeon and CRNA  Anesthesia Plan Comments:        Anesthesia Quick Evaluation

## 2012-01-17 NOTE — Op Note (Signed)
Linda Vargas, Linda Vargas            ACCOUNT NO.:  [id number]  MEDICAL RECORD NO.:  [id number]  LOCATION:  [facility]                          [facility]ACILITY:  [facility]  PHYSICIAN:  Zelphia Cairo, MD    DATE OF BIRTH:  Dec 07, 1975  DATE OF PROCEDURE:  01/17/2012 DATE OF DISCHARGE:                              OPERATIVE REPORT   PREOPERATIVE DIAGNOSIS:  Postpartum retained products of conception.  POSTOPERATIVE DIAGNOSES: 1. Postpartum retained products of conception. 2. 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  PROCEDURES: 1. Ultrasound-guided dilation and curettage. 2. Placement of Bakri balloon.  SURGEON:  Zelphia Cairo, MD  BLOOD LOSS:  1000 mL.  COMPLICATIONS:  None.  CONDITION:  Stable to recovery room.  PROCEDURE:  The patient was taken to the operating room.  After informed consent was obtained, she was dosed through her epidural and placed in the dorsal lithotomy position using Allen stirrups.  She was prepped and draped in sterile fashion.  An in-and-out catheter was used to drain her bladder.  Bivalve speculum was placed in the vagina and sidewall retractors were used to visualize the cervix.  The anterior cervical os was grasped with a ring forceps.  Curette was used to curettage and remove retained products of conception under ultrasound guidance. Despite all retained products being removed, she continued to have significant bleeding.  By the look on ultrasound and bimanual exploration, placenta accreta was suspected.  For this reason, a Bakri balloon was placed.  Dr. Macon Large did assist with Bakri balloon placement, 240 mL were placed in the balloon and the balloon was attached to a Foley catheter.  She tolerated the procedure well.  The vagina was packed with Kerlix and she was taken to the recovery room in stable condition.  CBC, coags, and fibrinogen are pending.  She will go to the ICU for postoperative management.    Zelphia Cairo, MD    GA/MEDQ  D:  01/17/2012  T:   01/17/2012  Job:  [id number]

## 2012-01-17 NOTE — Anesthesia Postprocedure Evaluation (Signed)
  Anesthesia Post-op Note  Patient: Linda Vargas  Procedure(s) Performed: Procedure(s) (LRB): DILATATION AND CURETTAGE (N/A)  Patient Location: PACU  Anesthesia Type: Epidural  Level of Consciousness: awake, alert  and oriented  Airway and Oxygen Therapy: Patient Spontanous Breathing  Post-op Pain: none  Post-op Assessment: Post-op Vital signs reviewed, Patient's Cardiovascular Status Stable, Respiratory Function Stable, Patent Airway, No signs of Nausea or vomiting, Pain level controlled, No headache and No backache  Post-op Vital Signs: Reviewed and stable  Complications: No apparent anesthesia complications

## 2012-01-17 NOTE — Progress Notes (Addendum)
SVD of vigerous female infant w/ apgars of 9,9.  Placenta delivered spontaneous w/ 3VC.   2nd degree lac repaired w/ 3-0 vicryl rapide.  Fundus firm but pt continued to have bleeding from uterus. Upon manual exploration, POC could be felt but a good plane between placental tissue and uterus could not be delveloped and all POC could not be removed.  Rec D&C to patient, pt agrees.  OR called, request [place] be present.

## 2012-01-18 ENCOUNTER — Inpatient Hospital Stay (HOSPITAL_COMMUNITY): Payer: BC Managed Care – PPO

## 2012-01-18 LAB — CBC
HCT: 21.8 % — ABNORMAL LOW (ref 36.0–46.0)
Hemoglobin: 7.5 g/dL — ABNORMAL LOW (ref 12.0–15.0)
MCH: 29.1 pg (ref 26.0–34.0)
MCHC: 34.4 g/dL (ref 30.0–36.0)
MCV: 84.5 fL (ref 78.0–100.0)
Platelets: 251 10*3/uL (ref 150–400)
RBC: 2.58 MIL/uL — ABNORMAL LOW (ref 3.87–5.11)
RDW: 12.6 % (ref 11.5–15.5)
WBC: 23.4 10*3/uL — ABNORMAL HIGH (ref 4.0–10.5)

## 2012-01-18 MED ORDER — TETANUS-DIPHTH-ACELL PERTUSSIS 5-2.5-18.5 LF-MCG/0.5 IM SUSP: 0.5000 mL | Freq: Once | INTRAMUSCULAR | Status: DC

## 2012-01-18 MED ORDER — OXYCODONE-ACETAMINOPHEN 5-325 MG PO TABS
1.0000 | ORAL_TABLET | ORAL | Status: DC | PRN
  Administered 2012-01-18: 1 via ORAL
  Administered 2012-01-18 (×3): 2 via ORAL
  Administered 2012-01-18 – 2012-01-19 (×2): 1 via ORAL
  Filled 2012-01-18 (×2): qty 1
  Filled 2012-01-18: qty 2
  Filled 2012-01-18: qty 1
  Filled 2012-01-18 (×2): qty 2

## 2012-01-18 MED ORDER — MEASLES, MUMPS & RUBELLA VAC ~~LOC~~ INJ: 0.5000 mL | INJECTION | Freq: Once | SUBCUTANEOUS | Status: DC

## 2012-01-18 MED ORDER — ONDANSETRON HCL 4 MG/2ML IJ SOLN: 4.0000 mg | INTRAMUSCULAR | Status: DC | PRN

## 2012-01-18 MED ORDER — SODIUM CHLORIDE 0.9 % IV SOLN
1.5000 g | Freq: Four times a day (QID) | INTRAVENOUS | Status: DC
  Administered 2012-01-18 – 2012-01-20 (×7): 1.5 g via INTRAVENOUS
  Filled 2012-01-18 (×11): qty 1.5

## 2012-01-18 MED ORDER — LACTATED RINGERS IV BOLUS (SEPSIS)
500.0000 mL | Freq: Once | INTRAVENOUS | Status: AC
  Administered 2012-01-18: 500 mL via INTRAVENOUS

## 2012-01-18 MED ORDER — METHYLERGONOVINE MALEATE 0.2 MG PO TABS
0.2000 mg | ORAL_TABLET | Freq: Three times a day (TID) | ORAL | Status: DC
  Administered 2012-01-18 – 2012-01-20 (×5): 0.2 mg via ORAL
  Filled 2012-01-18 (×6): qty 1

## 2012-01-18 MED ORDER — METHYLERGONOVINE MALEATE 0.2 MG PO TABS
ORAL_TABLET | ORAL | Status: AC
  Filled 2012-01-18: qty 1

## 2012-01-18 MED ORDER — SIMETHICONE 80 MG PO CHEW: 80.0000 mg | CHEWABLE_TABLET | ORAL | Status: DC | PRN

## 2012-01-18 MED ORDER — SENNOSIDES-DOCUSATE SODIUM 8.6-50 MG PO TABS
2.0000 | ORAL_TABLET | Freq: Every day | ORAL | Status: DC
  Administered 2012-01-18 – 2012-01-19 (×2): 2 via ORAL

## 2012-01-18 MED ORDER — BENZOCAINE-MENTHOL 20-0.5 % EX AERO
1.0000 "application " | INHALATION_SPRAY | CUTANEOUS | Status: DC | PRN
  Administered 2012-01-18: 1 via TOPICAL
  Filled 2012-01-18: qty 56

## 2012-01-18 MED ORDER — LACTATED RINGERS IV SOLN
INTRAVENOUS | Status: DC
  Administered 2012-01-18: 12:00:00 via INTRAVENOUS
  Administered 2012-01-18: 125 mL/h via INTRAVENOUS

## 2012-01-18 MED ORDER — ONDANSETRON HCL 4 MG PO TABS: 4.0000 mg | ORAL_TABLET | ORAL | Status: DC | PRN

## 2012-01-18 MED ORDER — LANOLIN HYDROUS EX OINT: TOPICAL_OINTMENT | CUTANEOUS | Status: DC | PRN

## 2012-01-18 MED ORDER — DIBUCAINE 1 % RE OINT: 1.0000 "application " | TOPICAL_OINTMENT | RECTAL | Status: DC | PRN

## 2012-01-18 MED ORDER — PRENATAL MULTIVITAMIN CH: 1.0000 | ORAL_TABLET | Freq: Every day | ORAL | Status: DC

## 2012-01-18 MED ORDER — OXYCODONE HCL 5 MG PO TABS
5.0000 mg | ORAL_TABLET | ORAL | Status: DC | PRN
  Administered 2012-01-18: 5 mg via ORAL
  Filled 2012-01-18: qty 1

## 2012-01-18 MED ORDER — DIPHENHYDRAMINE HCL 25 MG PO CAPS: 25.0000 mg | ORAL_CAPSULE | Freq: Four times a day (QID) | ORAL | Status: DC | PRN

## 2012-01-18 MED ORDER — MEDROXYPROGESTERONE ACETATE 150 MG/ML IM SUSP: 150.0000 mg | INTRAMUSCULAR | Status: DC | PRN

## 2012-01-18 MED ORDER — WITCH HAZEL-GLYCERIN EX PADS: 1.0000 "application " | MEDICATED_PAD | CUTANEOUS | Status: DC | PRN

## 2012-01-18 NOTE — Progress Notes (Signed)
Post Partum Day 1 Subjective: tolerating PO and Bakri balloon in place.  Pt c/o increase cramping and pressure in vagina  Objective: Blood pressure 118/66, pulse 112, temperature 98.3 F (36.8 C), temperature source Oral, resp. rate 18, height 5\' 10"  (1.778 m), weight 104.327 kg (230 lb), last menstrual period 05/01/2011, SpO2 100.00%, unknown if currently breastfeeding.  Physical Exam:  UOP ~30cc/hr since midnight General: alert and cooperative Lochia: scant blood in balloon bag Uterine Fundus: firm Incision: n/a DVT Evaluation: No evidence of DVT seen on physical exam.   Basename 01/18/12 0500 01/17/12 2215  HGB 7.5* 7.7*  HCT 21.8* 23.1*    Assessment/Plan: Will start to deflate balloon after 12 hrs (10am) Recheck CBC in am Labetalol on hold - tachycardic but BP stable   LOS: 2 days   Linda Vargas 01/18/2012, 8:41 AM

## 2012-01-18 NOTE — Addendum Note (Signed)
Addendum  created 01/18/12 2128 by Tyrone Apple. Malen Gauze, MD   Modules edited:Anesthesia Flowsheet, Orders

## 2012-01-18 NOTE — Progress Notes (Signed)
Removal of 60cc from Bakri balloon (180cc/240cc removed so far) & pt w/ minimal VB.  Bakri bag with 10cc total.    UOP improved to 50cc/hr VSS  A/P:  Continue slow deflation of Bakri balloon.   Continue methergine Unasyn

## 2012-01-18 NOTE — Progress Notes (Signed)
Remainder of saline removed from Bakri Balloon.  Deflated balloon & packing left in place.

## 2012-01-18 NOTE — Addendum Note (Signed)
Addendum  created 01/18/12 2128 by Tyrone Apple. Malen Gauze, MD   Modules edited:Anesthesia Flowsheet

## 2012-01-18 NOTE — Progress Notes (Signed)
PSYCHOSOCIAL ASSESSMENT ~ MATERNAL/CHILD Name: "Linda Vargas       Age: 35 day    Referral Date: 01/17/2012   Reason/Source: Hx of anxiety, panic attack I. FAMILY/HOME ENVIRONMENT A. Child's Legal Guardian Parent(s)    Name:  Linda Vargas DOB: 21-Aug-1976    Age: 55 Address: [address], [place], [address]  B. Support Persons:  Maternal friends and family : II. PSYCHOSOCIAL DATA A. Information Source X Patient Interview   B. Event organiser X Employment: Tax inspector, Geneticist, molecular. Schools   X Private Insurance: [employer]         C. Cultural and Environment Information/Cultural Issues Impacting Care: N/A III. STRENGTHS X Supportive family/friends   X Adequate Resources  X Compliance with medical plan  X Home prepared for Child (including basic supplies)                 X Understanding of illness            IV. RISK FACTORS AND CURRENT PROBLEMS        X No Problems Noted                        V. SOCIAL WORK ASSESSMENT Met with MOB and baby at bedside.  MOB is recovery well after a very difficult and complex delivery.  She reports feeling much better and is very happy to have baby by her side.  MOB reports she went on maternity leave on March 5th, and plans to return in 6 weeks.  She has an excellent and longstanding relationship with her primary care provider who will advocate for additional time if needed to ensure mom is well.  She reports excellent support from family and friends.  She is not with FOB, and she reports that is a good thing.  She was taking Effexor at the start of her pregnancy and weaned off.  She reports she did not have any problems with mood dysregulation without medications, and hopes to continue without them.  She understands she can access support or medication management again if needed.  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 Maternal aunt will provide child care when MOB returns to work.  MOB does not report any concerns or needs at this time.  She is energetic, happy, and engaging.  She very much enjoys being a new mom and looks forward to their life together.    VI. SOCIAL WORK PLAN X No Further Intervention Required/No Barriers to Discharge X Patient/Family Education: Feelings after birth Brochure  Staci Acosta, LCSW 7:17 pm

## 2012-01-18 NOTE — Progress Notes (Signed)
Jimmy (from pharmacy) called to discuss antibiotic therapy.  Will hold 1400 doses of Ancef & Clindamycin until I speak c Dr [name].

## 2012-01-18 NOTE — Progress Notes (Signed)
Pt without complaints.  60cc removed from bakri balloon - no increase bleeding  AF, VSS UOP 25cc/hr - concentrated Gen - NAD Abd - NT  A/P:  Will continue to monitor.   500cc IVF bolus Remove another 60cc at noon Monitor bleeding carefully

## 2012-01-19 ENCOUNTER — Encounter (HOSPITAL_COMMUNITY): Payer: Self-pay | Admitting: General Practice

## 2012-01-19 LAB — COMPREHENSIVE METABOLIC PANEL
ALT: 9 U/L (ref 0–35)
AST: 23 U/L (ref 0–37)
Albumin: 1.8 g/dL — ABNORMAL LOW (ref 3.5–5.2)
Alkaline Phosphatase: 77 U/L (ref 39–117)
BUN: 8 mg/dL (ref 6–23)
CO2: 25 mEq/L (ref 19–32)
Calcium: 8.6 mg/dL (ref 8.4–10.5)
Chloride: 106 mEq/L (ref 96–112)
Creatinine, Ser: 0.93 mg/dL (ref 0.50–1.10)
GFR calc Af Amer: >90 mL/min (ref >90)
GFR calc non Af Amer: 79 mL/min — ABNORMAL LOW (ref >90)
Glucose, Bld: 77 mg/dL (ref 70–99)
Potassium: 4.4 mEq/L (ref 3.5–5.1)
Sodium: 136 mEq/L (ref 135–145)
Total Bilirubin: 0.1 mg/dL — ABNORMAL LOW (ref 0.3–1.2)
Total Protein: 4.8 g/dL — ABNORMAL LOW (ref 6.0–8.3)

## 2012-01-19 LAB — CBC
HCT: 16.7 % — ABNORMAL LOW (ref 36.0–46.0)
HCT: 17 % — ABNORMAL LOW (ref 36.0–46.0)
Hemoglobin: 5.5 g/dL — CL (ref 12.0–15.0)
Hemoglobin: 5.7 g/dL — CL (ref 12.0–15.0)
MCH: 28.4 pg (ref 26.0–34.0)
MCH: 28.9 pg (ref 26.0–34.0)
MCHC: 32.9 g/dL (ref 30.0–36.0)
MCHC: 33.5 g/dL (ref 30.0–36.0)
MCV: 86.1 fL (ref 78.0–100.0)
MCV: 86.3 fL (ref 78.0–100.0)
Platelets: 202 10*3/uL (ref 150–400)
Platelets: 220 10*3/uL (ref 150–400)
RBC: 1.94 MIL/uL — ABNORMAL LOW (ref 3.87–5.11)
RBC: 1.97 MIL/uL — ABNORMAL LOW (ref 3.87–5.11)
RDW: 13 % (ref 11.5–15.5)
RDW: 12.9 % (ref 11.5–15.5)
WBC: 14.7 10*3/uL — ABNORMAL HIGH (ref 4.0–10.5)
WBC: 16.8 10*3/uL — ABNORMAL HIGH (ref 4.0–10.5)

## 2012-01-19 MED ORDER — FERROUS SULFATE 325 (65 FE) MG PO TABS
325.0000 mg | ORAL_TABLET | Freq: Three times a day (TID) | ORAL | Status: DC
  Administered 2012-01-19 – 2012-01-20 (×3): 325 mg via ORAL
  Filled 2012-01-19 (×4): qty 1

## 2012-01-19 NOTE — Progress Notes (Signed)
D/W patient this am her anemia.  Currently asymptomatic.  D/W possible transfusion of blood products and risks including transfusion reaction, HIV/Hep acquisition.  Pt does not want blood if possible.  Will observe closely and check CBC again this afternoon.  If has a significant drop in Hgb or becomes symptomatic, will transfuse.  Pt states she understands and agrees.

## 2012-01-19 NOTE — Progress Notes (Signed)
Post Partum Day 2 Subjective: no complaints, up ad lib, voiding, tolerating PO and denies dizziness, SOB or HA  Objective: Blood pressure 152/76, pulse 123, temperature 98.5 F (36.9 C), temperature source Oral, resp. rate 20, height 5\' 10"  (1.778 m), weight 104.327 kg (230 lb), last menstrual period 05/01/2011, SpO2 100.00%, unknown if currently breastfeeding.  Physical Exam:  General: alert and cooperative Lochia: appropriate Uterine Fundus: firm Incision: perineum intact DVT Evaluation: No evidence of DVT seen on physical exam.   Basename 01/19/12 0527 01/18/12 0500  HGB 5.5* 7.5*  HCT 16.7* 21.8*    Assessment/Plan: Plan for discharge tomorrow Continue ATB's for 24 hrs, repeat CBC in am   LOS: 3 days   Kayode Petion G 01/19/2012, 8:06 AM

## 2012-01-19 NOTE — Progress Notes (Signed)
HGB- 5.5 this am as called from lab. Plan to let oncoming Dr. Ashley Mariner.

## 2012-01-20 LAB — CBC
HCT: 15.9 % — ABNORMAL LOW (ref 36.0–46.0)
Hemoglobin: 5.3 g/dL — CL (ref 12.0–15.0)
MCH: 28.5 pg (ref 26.0–34.0)
MCHC: 33.3 g/dL (ref 30.0–36.0)
MCV: 85.5 fL (ref 78.0–100.0)
Platelets: 209 10*3/uL (ref 150–400)
RBC: 1.86 MIL/uL — ABNORMAL LOW (ref 3.87–5.11)
RDW: 12.8 % (ref 11.5–15.5)
WBC: 11 10*3/uL — ABNORMAL HIGH (ref 4.0–10.5)

## 2012-01-20 MED ORDER — OXYCODONE HCL 5 MG PO TABS: 5.0000 mg | ORAL_TABLET | ORAL | Status: AC | PRN

## 2012-01-20 MED ORDER — AMOXICILLIN-POT CLAVULANATE 875-125 MG PO TABS: 1.0000 | ORAL_TABLET | Freq: Two times a day (BID) | ORAL | Status: AC

## 2012-01-20 MED ORDER — FERROUS SULFATE 325 (65 FE) MG PO TABS: 325.0000 mg | ORAL_TABLET | Freq: Three times a day (TID) | ORAL | Status: DC

## 2012-01-20 NOTE — Progress Notes (Signed)
Post Partum Day 3 Subjective: no complaints, up ad lib, voiding, tolerating PO, + flatus and denies dizziness or SOB.  Objective: Blood pressure 132/89, pulse 91, temperature 98.3 F (36.8 C), temperature source Oral, resp. rate 20, height 5\' 10"  (1.778 m), weight 104.327 kg (230 lb), last menstrual period 05/01/2011, SpO2 100.00%, unknown if currently breastfeeding.  Physical Exam:  General: alert and cooperative Lochia: appropriate Uterine Fundus: firm Incision: perineum intact DVT Evaluation: No evidence of DVT seen on physical exam. DTR's 1+ no significant edema noted   Basename 01/20/12 0545 01/19/12 1415  HGB 5.3* 5.7*  HCT 15.9* 17.0*    Assessment/Plan: Discharge home RTO 2-3 days for Hgb and BP check   LOS: 4 days   Linda Vargas G 01/20/2012, 8:01 AM

## 2012-01-20 NOTE — Progress Notes (Signed)
Post discharge chart review completed.  

## 2012-01-20 NOTE — Progress Notes (Signed)
Dr. Henderson Cloud phoned and notified of critical lab value-HGB-5.3  This am. Dr. Informed of no bleeding increase and pt. asymtomatic.

## 2012-01-20 NOTE — Discharge Summary (Signed)
Obstetric Discharge Summary Reason for Admission: induction of labor Prenatal Procedures: ultrasound Intrapartum Procedures: spontaneous vaginal delivery Postpartum Procedures: curettage and antibiotics Complications-Operative and Postpartum: 2 degree perineal laceration and hemorrhage Hemoglobin  Date Value Range Status  01/20/2012 5.3* 12.0-15.0 (g/dL) Final     REPEATED TO VERIFY     CRITICAL RESULT CALLED TO, READ BACK BY AND VERIFIED WITH:     SPOKE TO PEACES @ 0628 ON [id number] BY BOSTONC     HCT  Date Value Range Status  01/20/2012 15.9* 36.0-46.0 (%) Final    Physical Exam:  General: alert and cooperative Lochia: appropriate Uterine Fundus: firm Incision: perineum intact DVT Evaluation: No evidence of DVT seen on physical exam.  Discharge Diagnoses: Term Pregnancy-delivered  Discharge Information: Date: 01/20/2012 Activity: pelvic rest Diet: routine Medications: PNV, Percocet and labetalol, Fe and Augmentin Condition: improved Instructions: refer to practice specific booklet Discharge to: home   Newborn Data: Live born female  Birth Weight: 5 lb 14.9 oz (2690 g) APGAR: 9, 9  Home with mother.  Yashas Camilli G 01/20/2012, 8:33 AM

## 2012-01-20 NOTE — Progress Notes (Signed)
rec' d critical valve of hgb 5.3 from lab  Parks Neptune RN  Notified  Dr. Henderson Cloud  Pt. asymptomatic no new orders given

## 2012-01-21 LAB — TYPE AND SCREEN
ABO/RH(D): O POS
Antibody Screen: NEGATIVE
Unit division: 0
Unit division: 0

## 2012-02-20 ENCOUNTER — Encounter (HOSPITAL_COMMUNITY): Payer: Self-pay | Admitting: *Deleted

## 2012-02-20 ENCOUNTER — Inpatient Hospital Stay (HOSPITAL_COMMUNITY)
Admission: AD | Admit: 2012-02-20 | Discharge: 2012-02-20 | Disposition: A | Discharge status: Home / Self Care | Payer: BC Managed Care – PPO | Source: Ambulatory Visit | Attending: Obstetrics and Gynecology | Admitting: Obstetrics and Gynecology

## 2012-02-20 DIAGNOSIS — N920 Excessive and frequent menstruation with regular cycle: Secondary | ICD-10-CM

## 2012-02-20 DIAGNOSIS — G43909 Migraine, unspecified, not intractable, without status migrainosus: Secondary | ICD-10-CM

## 2012-02-20 DIAGNOSIS — N949 Unspecified condition associated with female genital organs and menstrual cycle: Secondary | ICD-10-CM | POA: Insufficient documentation

## 2012-02-20 DIAGNOSIS — N925 Other specified irregular menstruation: Secondary | ICD-10-CM | POA: Insufficient documentation

## 2012-02-20 DIAGNOSIS — N390 Urinary tract infection, site not specified: Secondary | ICD-10-CM | POA: Insufficient documentation

## 2012-02-20 DIAGNOSIS — N938 Other specified abnormal uterine and vaginal bleeding: Secondary | ICD-10-CM | POA: Insufficient documentation

## 2012-02-20 LAB — COMPREHENSIVE METABOLIC PANEL
ALT: 13 U/L (ref 0–35)
AST: 19 U/L (ref 0–37)
Albumin: 3.5 g/dL (ref 3.5–5.2)
Alkaline Phosphatase: 69 U/L (ref 39–117)
BUN: 10 mg/dL (ref 6–23)
CO2: 23 mEq/L (ref 19–32)
Calcium: 9.3 mg/dL (ref 8.4–10.5)
Chloride: 107 mEq/L (ref 96–112)
Creatinine, Ser: 1.12 mg/dL — ABNORMAL HIGH (ref 0.50–1.10)
GFR calc Af Amer: 73 mL/min — ABNORMAL LOW (ref >90)
GFR calc non Af Amer: 63 mL/min — ABNORMAL LOW (ref >90)
Glucose, Bld: 93 mg/dL (ref 70–99)
Potassium: 3.6 mEq/L (ref 3.5–5.1)
Sodium: 140 mEq/L (ref 135–145)
Total Bilirubin: 0.2 mg/dL — ABNORMAL LOW (ref 0.3–1.2)
Total Protein: 6.5 g/dL (ref 6.0–8.3)

## 2012-02-20 LAB — URINALYSIS, ROUTINE W REFLEX MICROSCOPIC
Bilirubin Urine: NEGATIVE
Glucose, UA: 100 mg/dL — AB
Ketones, ur: 15 mg/dL — AB
Leukocytes, UA: NEGATIVE
Nitrite: POSITIVE — AB
Protein, ur: >300 mg/dL — AB
Specific Gravity, Urine: >1.03 — ABNORMAL HIGH (ref 1.005–1.030)
Urobilinogen, UA: 1 mg/dL (ref 0.0–1.0)
pH: 5 (ref 5.0–8.0)

## 2012-02-20 LAB — URINE MICROSCOPIC-ADD ON

## 2012-02-20 LAB — CBC
HCT: 30.3 % — ABNORMAL LOW (ref 36.0–46.0)
Hemoglobin: 9.2 g/dL — ABNORMAL LOW (ref 12.0–15.0)
MCH: 26 pg (ref 26.0–34.0)
MCHC: 30.4 g/dL (ref 30.0–36.0)
MCV: 85.6 fL (ref 78.0–100.0)
Platelets: 336 10*3/uL (ref 150–400)
RBC: 3.54 MIL/uL — ABNORMAL LOW (ref 3.87–5.11)
RDW: 12.6 % (ref 11.5–15.5)
WBC: 3.4 10*3/uL — ABNORMAL LOW (ref 4.0–10.5)

## 2012-02-20 LAB — HCG, QUANTITATIVE, PREGNANCY: hCG, Beta Chain, Quant, S: <1 m[IU]/mL (ref <5)

## 2012-02-20 MED ORDER — OXYCODONE-ACETAMINOPHEN 5-325 MG PO TABS
1.0000 | ORAL_TABLET | ORAL | Status: AC
  Administered 2012-02-20: 1 via ORAL
  Filled 2012-02-20: qty 1

## 2012-02-20 MED ORDER — NITROFURANTOIN MONOHYD MACRO 100 MG PO CAPS: 100.0000 mg | ORAL_CAPSULE | Freq: Two times a day (BID) | ORAL | Status: AC

## 2012-02-20 MED ORDER — NITROFURANTOIN MONOHYD MACRO 100 MG PO CAPS: 100.0000 mg | ORAL_CAPSULE | Freq: Two times a day (BID) | ORAL | Status: DC

## 2012-02-20 NOTE — MAU Note (Signed)
Pt presents with complaint of heavy vaginal bleeding. Reports she had a vaginal delivery 5 weeks ago, had accreta and had to have an emergency D&C post delivery. When she left the hospital hgb was 5.7. Pt states she was seen for her follow up on Tuesday and her hgb was 9.7. States she started bleeding again 2 days ago and has been changing a pad q 30 minutes, feels weak and dizzy and had onset of severe headache about 4 hours ago.

## 2012-02-20 NOTE — Discharge Instructions (Signed)

## 2012-02-20 NOTE — MAU Provider Note (Signed)
History     CSN: [id number]  Arrival date and time: 02/20/12 4098   First Provider Initiated Contact with Patient 02/20/12 0501     35 [id number] with SVD followed by [facility] for placenta acreta on 02/15/12.  Hgb at D/C was 5.7. Chief Complaint  Patient presents with  . Vaginal Bleeding   HPI Pt presents to MAU with onset of vaginal bleeding yesterday that has gradually increased in amount and reports soaking 1 pad every 30 minutes for last couple of hours with clots. She also had a sudden onset of severe headache this afternoon accompanied by light sensitivity.  She is worried that her anemia after delivery and the bleeding she is having now are causing the h/a.  She denies n/v, fever/chills, epigastric pain, visual disturbances, urinary symptoms, or vaginal itching/burning.    OB History    Grav Para Term Preterm Abortions TAB SAB Ect Mult Living   3 1 1  0 2 1 1  0 0 1      Past Medical History  Diagnosis Date  . Hypertension   . Seasonal allergic rhinitis   . Lupus   . Anxiety   . Migraine     Past Surgical History  Procedure Date  . Dilation and curettage of uterus 2011  . Foot surgery   . Cholecystectomy 2012  . Tonsillectomy   . Dilation and curettage of uterus 01/17/2012    Procedure: DILATATION AND CURETTAGE;  Surgeon: Zelphia Cairo, MD;  Location: [facility];  Service: Gynecology;  Laterality: N/A;  Dilatation and curratage, insertion of Bakri Balloon    Family History  Problem Relation Age of Onset  . Hypertension Mother   . Hypertension Father   . Diabetes Father   . Heart disease Maternal Grandmother   . Heart disease Paternal Grandmother   . Anesthesia problems Neg Hx     History  Substance Use Topics  . Smoking status: Never Smoker   . Smokeless tobacco: Never Used  . Alcohol Use: No    Allergies:  Allergies  Allergen Reactions  . Bean Pod Extract Hives  . Ibuprofen Swelling    Lips swell  . Peanut-Containing Drug Products Hives    Prescriptions  prior to admission  Medication Sig Dispense Refill  . acetaminophen (TYLENOL) 500 MG tablet Take 1,000 mg by mouth every 6 (six) hours as needed.      . ferrous sulfate 325 (65 FE) MG tablet Take 1 tablet (325 mg total) by mouth 3 (three) times daily with meals.  90 tablet  1  . hydroxychloroquine (PLAQUENIL) 200 MG tablet Take 200 mg by mouth 2 (two) times daily.        Marland Kitchen labetalol (NORMODYNE) 100 MG tablet Take 100 mg by mouth 2 (two) times daily.        . prenatal vitamin w/FE, FA (NATACHEW) 29-1 MG CHEW Chew 2 tablets by mouth every morning.        Review of Systems  Constitutional: Negative for fever, chills and malaise/fatigue.  Eyes: Negative for blurred vision.  Respiratory: Negative for cough and shortness of breath.   Cardiovascular: Negative for chest pain.  Gastrointestinal: Negative for heartburn, nausea and vomiting.  Genitourinary: Negative for dysuria, urgency and frequency.  Musculoskeletal: Negative.   Neurological: Positive for dizziness, weakness and headaches.  Psychiatric/Behavioral: Negative for depression.   Physical Exam   Blood pressure 144/92, pulse 70, temperature 97.7 F (36.5 C), temperature source Oral, resp. rate 18, height 5\' 8"  (1.727 m), weight 92.534 kg (  204 lb), last menstrual period 02/18/2012, SpO2 99.00%.  Patient Vitals for the past 24 hrs:  BP Temp Temp src Pulse Resp SpO2 Height Weight  02/20/12 0639 127/69 mmHg - - 72  18  - - -  02/20/12 0609 133/69 mmHg - - 71  - - - -  02/20/12 0553 142/82 mmHg - - 58  - - - -  02/20/12 0542 141/71 mmHg - - 64  - - - -  02/20/12 0433 144/92 mmHg 97.7 F (36.5 C) Oral 70  18  99 % 5\' 8"  (1.727 m) 92.534 kg (204 lb)   Physical Exam  Nursing note and vitals reviewed. Constitutional: She is oriented to person, place, and time. She appears well-developed and well-nourished.  Neck: Normal range of motion.  Cardiovascular: Normal rate, regular rhythm and normal heart sounds.   Respiratory: Effort normal  and breath sounds normal.  GI: Soft.  Genitourinary:       Pelvic exam: Cervix pink, visually closed, without lesion, moderate amount bright red bleeding in vaginal vault, none on pad under pt, no clots noted, vaginal walls and external genitalia normal Bimanual exam: Cervix 0/long/high,firm, posterior, neg CMT, uterus tender upon palpation, nonenlarged, adnexa without tenderness, enlargement or mass   Musculoskeletal: Normal range of motion.  Neurological: She is alert and oriented to person, place, and time.  Skin: Skin is warm and dry.  Psychiatric: She has a normal mood and affect. Her behavior is normal. Judgment and thought content normal.   Neg CVA tenderness  Results for orders placed during the hospital encounter of 02/20/12 (from the past 24 hour(s))  CBC     Status: Abnormal   Collection Time   02/20/12  4:55 AM      Component Value Range   WBC 3.4 (*) 4.0 - 10.5 (K/uL)   RBC 3.54 (*) 3.87 - 5.11 (MIL/uL)   Hemoglobin 9.2 (*) 12.0 - 15.0 (g/dL)   HCT 40.9 (*) 81.1 - 46.0 (%)   MCV 85.6  78.0 - 100.0 (fL)   MCH 26.0  26.0 - 34.0 (pg)   MCHC 30.4  30.0 - 36.0 (g/dL)   RDW 91.4  78.2 - 95.6 (%)   Platelets 336  150 - 400 (K/uL)  COMPREHENSIVE METABOLIC PANEL     Status: Abnormal   Collection Time   02/20/12  4:55 AM      Component Value Range   Sodium 140  135 - 145 (mEq/L)   Potassium 3.6  3.5 - 5.1 (mEq/L)   Chloride 107  96 - 112 (mEq/L)   CO2 23  19 - 32 (mEq/L)   Glucose, Bld 93  70 - 99 (mg/dL)   BUN 10  6 - 23 (mg/dL)   Creatinine, Ser 2.13 (*) 0.50 - 1.10 (mg/dL)   Calcium 9.3  8.4 - 08.6 (mg/dL)   Total Protein 6.5  6.0 - 8.3 (g/dL)   Albumin 3.5  3.5 - 5.2 (g/dL)   AST 19  0 - 37 (U/L)   ALT 13  0 - 35 (U/L)   Alkaline Phosphatase 69  39 - 117 (U/L)   Total Bilirubin 0.2 (*) 0.3 - 1.2 (mg/dL)   GFR calc non Af Amer 63 (*) >90 (mL/min)   GFR calc Af Amer 73 (*) >90 (mL/min)  HCG, QUANTITATIVE, PREGNANCY     Status: Normal   Collection Time   02/20/12   4:55 AM      Component Value Range   hCG, Beta Chain, Quant, S <  1  <5 (mIU/mL)  URINALYSIS, ROUTINE W REFLEX MICROSCOPIC     Status: Abnormal   Collection Time   02/20/12  5:35 AM      Component Value Range   Color, Urine RED (*) YELLOW    APPearance CLOUDY (*) CLEAR    Specific Gravity, Urine >1.030 (*) 1.005 - 1.030    pH 5.0  5.0 - 8.0    Glucose, UA 100 (*) NEGATIVE (mg/dL)   Hgb urine dipstick LARGE (*) NEGATIVE    Bilirubin Urine NEGATIVE  NEGATIVE    Ketones, ur 15 (*) NEGATIVE (mg/dL)   Protein, ur >621 (*) NEGATIVE (mg/dL)   Urobilinogen, UA 1.0  0.0 - 1.0 (mg/dL)   Nitrite POSITIVE (*) NEGATIVE    Leukocytes, UA NEGATIVE  NEGATIVE   URINE MICROSCOPIC-ADD ON     Status: Normal   Collection Time   02/20/12  5:35 AM      Component Value Range   Squamous Epithelial / LPF RARE  RARE    WBC, UA 0-2  <3 (WBC/hpf)   RBC / HPF 21-50  <3 (RBC/hpf)   MAU Course  Procedures U/A, CBC, CMP, pelvic exam   Assessment and Plan  A: UTI Initial menses following pregnancy  P: Discussed assessment and findings with Dr [name] Order quantitative HCG to evaluate for retained POCs PO hydration and Percocet PO for h/a--Headache improved but not resolved Treat for UTI  D/C home with bleeding and preeclampsia precautions Macrobid 100 mg BID x7 days Encourage PO fluids, rest in dark room, have family help with baby F/U with Dr [name] on Monday morning Return to MAU as needed   LEFTWICH-KIRBY, Edwardine Deschepper 02/20/2012, 5:15 AM

## 2012-07-28 ENCOUNTER — Emergency Department: Payer: Self-pay | Admitting: Emergency Medicine

## 2012-07-28 LAB — COMPREHENSIVE METABOLIC PANEL
Albumin: 3.6 g/dL (ref 3.4–5.0)
Alkaline Phosphatase: 86 U/L (ref 50–136)
Anion Gap: 7 (ref 7–16)
BUN: 9 mg/dL (ref 7–18)
Bilirubin,Total: 0.3 mg/dL (ref 0.2–1.0)
Calcium, Total: 8.8 mg/dL (ref 8.5–10.1)
Chloride: 109 mmol/L — ABNORMAL HIGH (ref 98–107)
Co2: 26 mmol/L (ref 21–32)
Creatinine: 0.94 mg/dL (ref 0.60–1.30)
EGFR (African American): >60
EGFR (Non-African Amer.): >60
Glucose: 66 mg/dL (ref 65–99)
Osmolality: 280 (ref 275–301)
Potassium: 3.8 mmol/L (ref 3.5–5.1)
SGOT(AST): 24 U/L (ref 15–37)
SGPT (ALT): 20 U/L (ref 12–78)
Sodium: 142 mmol/L (ref 136–145)
Total Protein: 7.5 g/dL (ref 6.4–8.2)

## 2012-07-28 LAB — CBC
HCT: 33.9 % — ABNORMAL LOW (ref 35.0–47.0)
HGB: 11.7 g/dL — ABNORMAL LOW (ref 12.0–16.0)
MCH: 29.5 pg (ref 26.0–34.0)
MCHC: 34.5 g/dL (ref 32.0–36.0)
MCV: 85 fL (ref 80–100)
Platelet: 356 10*3/uL (ref 150–440)
RBC: 3.97 10*6/uL (ref 3.80–5.20)
RDW: 14.5 % (ref 11.5–14.5)
WBC: 6.1 10*3/uL (ref 3.6–11.0)

## 2012-07-28 LAB — URINALYSIS, COMPLETE
Bacteria: NONE SEEN
Bilirubin,UR: NEGATIVE
Blood: NEGATIVE
Glucose,UR: NEGATIVE mg/dL (ref 0–75)
Ketone: NEGATIVE
Leukocyte Esterase: NEGATIVE
Nitrite: NEGATIVE
Ph: 6 (ref 4.5–8.0)
Protein: NEGATIVE
RBC,UR: 1 /HPF (ref 0–5)
Specific Gravity: 1.012 (ref 1.003–1.030)
Squamous Epithelial: 1
WBC UR: <1 /HPF (ref 0–5)

## 2012-07-28 LAB — CK TOTAL AND CKMB (NOT AT ARMC)
CK, Total: 257 U/L — ABNORMAL HIGH (ref 21–215)
CK-MB: 1 ng/mL (ref 0.5–3.6)

## 2012-07-28 LAB — TROPONIN I: Troponin-I: <0.02 ng/mL

## 2012-07-29 LAB — URIC ACID: Uric Acid: 3.4 mg/dL (ref 2.6–6.0)

## 2012-10-21 ENCOUNTER — Emergency Department: Payer: Self-pay | Admitting: Emergency Medicine

## 2012-10-21 LAB — COMPREHENSIVE METABOLIC PANEL
Albumin: 3.6 g/dL (ref 3.4–5.0)
Alkaline Phosphatase: 100 U/L (ref 50–136)
Anion Gap: 5 — ABNORMAL LOW (ref 7–16)
BUN: 9 mg/dL (ref 7–18)
Bilirubin,Total: 0.4 mg/dL (ref 0.2–1.0)
Calcium, Total: 8.6 mg/dL (ref 8.5–10.1)
Chloride: 109 mmol/L — ABNORMAL HIGH (ref 98–107)
Co2: 27 mmol/L (ref 21–32)
Creatinine: 1.08 mg/dL (ref 0.60–1.30)
EGFR (African American): >60
EGFR (Non-African Amer.): >60
Glucose: 77 mg/dL (ref 65–99)
Osmolality: 279 (ref 275–301)
Potassium: 3.7 mmol/L (ref 3.5–5.1)
SGOT(AST): 25 U/L (ref 15–37)
SGPT (ALT): 28 U/L (ref 12–78)
Sodium: 141 mmol/L (ref 136–145)
Total Protein: 7.6 g/dL (ref 6.4–8.2)

## 2012-10-21 LAB — CBC
HCT: 36 % (ref 35.0–47.0)
HGB: 11.3 g/dL — ABNORMAL LOW (ref 12.0–16.0)
MCH: 26.6 pg (ref 26.0–34.0)
MCHC: 31.5 g/dL — ABNORMAL LOW (ref 32.0–36.0)
MCV: 84 fL (ref 80–100)
Platelet: 355 10*3/uL (ref 150–440)
RBC: 4.27 10*6/uL (ref 3.80–5.20)
RDW: 13 % (ref 11.5–14.5)
WBC: 4.1 10*3/uL (ref 3.6–11.0)

## 2012-10-21 LAB — HCG, QUANTITATIVE, PREGNANCY: Beta Hcg, Quant.: <1 m[IU]/mL — ABNORMAL LOW

## 2012-10-21 LAB — LIPASE, BLOOD: Lipase: 269 U/L (ref 73–393)

## 2012-11-12 DIAGNOSIS — M797 Fibromyalgia: Secondary | ICD-10-CM | POA: Insufficient documentation

## 2013-03-02 ENCOUNTER — Ambulatory Visit: Payer: BC Managed Care – PPO | Admitting: Internal Medicine

## 2013-03-24 ENCOUNTER — Other Ambulatory Visit: Payer: Self-pay | Admitting: Occupational Medicine

## 2013-03-24 ENCOUNTER — Ambulatory Visit: Payer: Self-pay

## 2013-03-24 DIAGNOSIS — R52 Pain, unspecified: Secondary | ICD-10-CM

## 2013-04-08 ENCOUNTER — Ambulatory Visit: Payer: BC Managed Care – PPO | Admitting: Internal Medicine

## 2013-06-07 ENCOUNTER — Ambulatory Visit: Payer: BC Managed Care – PPO | Admitting: Internal Medicine

## 2014-07-21 DIAGNOSIS — Z79899 Other long term (current) drug therapy: Secondary | ICD-10-CM | POA: Insufficient documentation

## 2014-08-14 ENCOUNTER — Encounter (HOSPITAL_COMMUNITY): Payer: Self-pay | Admitting: *Deleted

## 2015-05-02 ENCOUNTER — Other Ambulatory Visit: Payer: Self-pay | Admitting: Obstetrics and Gynecology

## 2015-05-03 LAB — CYTOLOGY - PAP

## 2015-10-12 ENCOUNTER — Encounter: Payer: Self-pay | Admitting: Emergency Medicine

## 2015-10-12 ENCOUNTER — Emergency Department: Payer: BC Managed Care – PPO

## 2015-10-12 ENCOUNTER — Observation Stay (HOSPITAL_BASED_OUTPATIENT_CLINIC_OR_DEPARTMENT_OTHER)
Admit: 2015-10-12 | Discharge: 2015-10-12 | Disposition: A | Discharge status: Home / Self Care | Payer: BC Managed Care – PPO | Attending: Internal Medicine | Admitting: Internal Medicine

## 2015-10-12 ENCOUNTER — Observation Stay: Payer: BC Managed Care – PPO

## 2015-10-12 ENCOUNTER — Observation Stay
Admission: EM | Admit: 2015-10-12 | Discharge: 2015-10-13 | Disposition: A | Discharge status: Home / Self Care | Payer: BC Managed Care – PPO | Attending: Internal Medicine | Admitting: Internal Medicine

## 2015-10-12 DIAGNOSIS — E785 Hyperlipidemia, unspecified: Secondary | ICD-10-CM | POA: Diagnosis not present

## 2015-10-12 DIAGNOSIS — R0602 Shortness of breath: Secondary | ICD-10-CM | POA: Diagnosis not present

## 2015-10-12 DIAGNOSIS — I340 Nonrheumatic mitral (valve) insufficiency: Secondary | ICD-10-CM | POA: Diagnosis not present

## 2015-10-12 DIAGNOSIS — R531 Weakness: Secondary | ICD-10-CM | POA: Diagnosis not present

## 2015-10-12 DIAGNOSIS — M797 Fibromyalgia: Secondary | ICD-10-CM | POA: Diagnosis not present

## 2015-10-12 DIAGNOSIS — Z8249 Family history of ischemic heart disease and other diseases of the circulatory system: Secondary | ICD-10-CM | POA: Insufficient documentation

## 2015-10-12 DIAGNOSIS — Z9049 Acquired absence of other specified parts of digestive tract: Secondary | ICD-10-CM | POA: Insufficient documentation

## 2015-10-12 DIAGNOSIS — G43409 Hemiplegic migraine, not intractable, without status migrainosus: Secondary | ICD-10-CM | POA: Diagnosis not present

## 2015-10-12 DIAGNOSIS — R079 Chest pain, unspecified: Secondary | ICD-10-CM

## 2015-10-12 DIAGNOSIS — Z7901 Long term (current) use of anticoagulants: Secondary | ICD-10-CM | POA: Insufficient documentation

## 2015-10-12 DIAGNOSIS — F419 Anxiety disorder, unspecified: Secondary | ICD-10-CM | POA: Diagnosis not present

## 2015-10-12 DIAGNOSIS — Z23 Encounter for immunization: Secondary | ICD-10-CM | POA: Diagnosis not present

## 2015-10-12 DIAGNOSIS — J309 Allergic rhinitis, unspecified: Secondary | ICD-10-CM | POA: Diagnosis not present

## 2015-10-12 DIAGNOSIS — G459 Transient cerebral ischemic attack, unspecified: Secondary | ICD-10-CM | POA: Diagnosis present

## 2015-10-12 DIAGNOSIS — F329 Major depressive disorder, single episode, unspecified: Secondary | ICD-10-CM | POA: Insufficient documentation

## 2015-10-12 DIAGNOSIS — Z91018 Allergy to other foods: Secondary | ICD-10-CM | POA: Diagnosis not present

## 2015-10-12 DIAGNOSIS — L949 Localized connective tissue disorder, unspecified: Secondary | ICD-10-CM | POA: Diagnosis not present

## 2015-10-12 DIAGNOSIS — R29898 Other symptoms and signs involving the musculoskeletal system: Secondary | ICD-10-CM | POA: Diagnosis present

## 2015-10-12 DIAGNOSIS — I10 Essential (primary) hypertension: Secondary | ICD-10-CM | POA: Insufficient documentation

## 2015-10-12 DIAGNOSIS — IMO0002 Reserved for concepts with insufficient information to code with codable children: Secondary | ICD-10-CM

## 2015-10-12 DIAGNOSIS — N898 Other specified noninflammatory disorders of vagina: Secondary | ICD-10-CM | POA: Insufficient documentation

## 2015-10-12 DIAGNOSIS — Z91010 Allergy to peanuts: Secondary | ICD-10-CM | POA: Diagnosis not present

## 2015-10-12 DIAGNOSIS — R51 Headache: Secondary | ICD-10-CM | POA: Diagnosis not present

## 2015-10-12 DIAGNOSIS — M329 Systemic lupus erythematosus, unspecified: Secondary | ICD-10-CM | POA: Insufficient documentation

## 2015-10-12 DIAGNOSIS — R200 Anesthesia of skin: Secondary | ICD-10-CM | POA: Insufficient documentation

## 2015-10-12 DIAGNOSIS — Z886 Allergy status to analgesic agent status: Secondary | ICD-10-CM | POA: Insufficient documentation

## 2015-10-12 DIAGNOSIS — Z833 Family history of diabetes mellitus: Secondary | ICD-10-CM | POA: Insufficient documentation

## 2015-10-12 HISTORY — DX: Systemic involvement of connective tissue, unspecified: M35.9

## 2015-10-12 HISTORY — DX: Fibromyalgia: M79.7

## 2015-10-12 HISTORY — DX: Transient cerebral ischemic attack, unspecified: G45.9

## 2015-10-12 LAB — CBC
HCT: 41.5 % (ref 35.0–47.0)
Hemoglobin: 13.8 g/dL (ref 12.0–16.0)
MCH: 28.7 pg (ref 26.0–34.0)
MCHC: 33.3 g/dL (ref 32.0–36.0)
MCV: 86.1 fL (ref 80.0–100.0)
Platelets: 305 10*3/uL (ref 150–440)
RBC: 4.82 MIL/uL (ref 3.80–5.20)
RDW: 12.5 % (ref 11.5–14.5)
WBC: 4.8 10*3/uL (ref 3.6–11.0)

## 2015-10-12 LAB — BASIC METABOLIC PANEL
Anion gap: 6 (ref 5–15)
BUN: 13 mg/dL (ref 6–20)
CO2: 25 mmol/L (ref 22–32)
Calcium: 9.3 mg/dL (ref 8.9–10.3)
Chloride: 107 mmol/L (ref 101–111)
Creatinine, Ser: 1.03 mg/dL — ABNORMAL HIGH (ref 0.44–1.00)
GFR calc Af Amer: >60 mL/min (ref >60)
GFR calc non Af Amer: >60 mL/min (ref >60)
Glucose, Bld: 61 mg/dL — ABNORMAL LOW (ref 65–99)
Potassium: 3.7 mmol/L (ref 3.5–5.1)
Sodium: 138 mmol/L (ref 135–145)

## 2015-10-12 LAB — LIPID PANEL
Cholesterol: 200 mg/dL (ref 0–200)
HDL: 82 mg/dL (ref >40)
LDL Cholesterol: 109 mg/dL — ABNORMAL HIGH (ref 0–99)
Total CHOL/HDL Ratio: 2.4 RATIO
Triglycerides: 43 mg/dL (ref <150)
VLDL: 9 mg/dL (ref 0–40)

## 2015-10-12 LAB — URINALYSIS COMPLETE WITH MICROSCOPIC (ARMC ONLY)
Bilirubin Urine: NEGATIVE
Glucose, UA: 50 mg/dL — AB
Hgb urine dipstick: NEGATIVE
Ketones, ur: NEGATIVE mg/dL
Leukocytes, UA: NEGATIVE
Nitrite: NEGATIVE
Protein, ur: NEGATIVE mg/dL
Specific Gravity, Urine: 1.017 (ref 1.005–1.030)
pH: 6 (ref 5.0–8.0)

## 2015-10-12 LAB — ETHANOL: Alcohol, Ethyl (B): <5 mg/dL (ref <5)

## 2015-10-12 LAB — HEPATIC FUNCTION PANEL
ALT: 19 U/L (ref 14–54)
AST: 24 U/L (ref 15–41)
Albumin: 4.1 g/dL (ref 3.5–5.0)
Alkaline Phosphatase: 72 U/L (ref 38–126)
Bilirubin, Direct: <0.1 mg/dL — ABNORMAL LOW (ref 0.1–0.5)
Total Bilirubin: 0.6 mg/dL (ref 0.3–1.2)
Total Protein: 7.9 g/dL (ref 6.5–8.1)

## 2015-10-12 LAB — URINE DRUG SCREEN, QUALITATIVE (ARMC ONLY)
Amphetamines, Ur Screen: NOT DETECTED
Barbiturates, Ur Screen: NOT DETECTED
Benzodiazepine, Ur Scrn: NOT DETECTED
Cannabinoid 50 Ng, Ur ~~LOC~~: NOT DETECTED
Cocaine Metabolite,Ur ~~LOC~~: NOT DETECTED
MDMA (Ecstasy)Ur Screen: NOT DETECTED
Methadone Scn, Ur: NOT DETECTED
Opiate, Ur Screen: POSITIVE — AB
Phencyclidine (PCP) Ur S: NOT DETECTED
Tricyclic, Ur Screen: NOT DETECTED

## 2015-10-12 LAB — GLUCOSE, CAPILLARY: Glucose-Capillary: 59 mg/dL — ABNORMAL LOW (ref 65–99)

## 2015-10-12 LAB — PROTIME-INR
INR: 1.03
Prothrombin Time: 13.7 seconds (ref 11.4–15.0)

## 2015-10-12 LAB — APTT: aPTT: 26 seconds (ref 24–36)

## 2015-10-12 LAB — TROPONIN I
Troponin I: <0.03 ng/mL (ref <0.031)
Troponin I: <0.03 ng/mL (ref <0.031)
Troponin I: <0.03 ng/mL (ref <0.031)

## 2015-10-12 MED ORDER — DEXTROSE 50 % IV SOLN
25.0000 mL | Freq: Once | INTRAVENOUS | Status: AC
  Administered 2015-10-12: 25 mL via INTRAVENOUS

## 2015-10-12 MED ORDER — LORATADINE 10 MG PO TABS
10.0000 mg | ORAL_TABLET | Freq: Every day | ORAL | Status: DC
  Administered 2015-10-12 – 2015-10-13 (×2): 10 mg via ORAL
  Filled 2015-10-12 (×2): qty 1

## 2015-10-12 MED ORDER — ACETAMINOPHEN 325 MG PO TABS
650.0000 mg | ORAL_TABLET | Freq: Four times a day (QID) | ORAL | Status: DC | PRN
  Administered 2015-10-12: 650 mg via ORAL
  Filled 2015-10-12: qty 2

## 2015-10-12 MED ORDER — MAGNESIUM SULFATE 2 GM/50ML IV SOLN
2.0000 g | Freq: Once | INTRAVENOUS | Status: AC
  Administered 2015-10-12: 20:00:00 2 g via INTRAVENOUS
  Filled 2015-10-12: qty 50

## 2015-10-12 MED ORDER — SODIUM CHLORIDE 0.9 % IJ SOLN
3.0000 mL | Freq: Two times a day (BID) | INTRAMUSCULAR | Status: DC
  Administered 2015-10-12: 17:00:00 3 mL via INTRAVENOUS

## 2015-10-12 MED ORDER — ENOXAPARIN SODIUM 40 MG/0.4ML ~~LOC~~ SOLN
40.0000 mg | SUBCUTANEOUS | Status: DC
  Administered 2015-10-12: 40 mg via SUBCUTANEOUS
  Filled 2015-10-12: qty 0.4

## 2015-10-12 MED ORDER — ACETAMINOPHEN 650 MG RE SUPP: 650.0000 mg | Freq: Four times a day (QID) | RECTAL | Status: DC | PRN

## 2015-10-12 MED ORDER — MORPHINE SULFATE (PF) 4 MG/ML IV SOLN
4.0000 mg | Freq: Once | INTRAVENOUS | Status: AC
  Administered 2015-10-12: 4 mg via INTRAVENOUS
  Filled 2015-10-12: qty 1

## 2015-10-12 MED ORDER — HYDROXYCHLOROQUINE SULFATE 200 MG PO TABS
200.0000 mg | ORAL_TABLET | Freq: Two times a day (BID) | ORAL | Status: DC
  Administered 2015-10-12 – 2015-10-13 (×2): 200 mg via ORAL
  Filled 2015-10-12 (×2): qty 1

## 2015-10-12 MED ORDER — SODIUM CHLORIDE 0.9 % IV SOLN
INTRAVENOUS | Status: DC
  Administered 2015-10-12: 17:00:00 via INTRAVENOUS

## 2015-10-12 MED ORDER — FERROUS SULFATE 325 (65 FE) MG PO TABS
325.0000 mg | ORAL_TABLET | Freq: Three times a day (TID) | ORAL | Status: DC
  Administered 2015-10-12 – 2015-10-13 (×2): 325 mg via ORAL
  Filled 2015-10-12 (×2): qty 1

## 2015-10-12 MED ORDER — VERAPAMIL HCL ER 180 MG PO CP24
180.0000 mg | ORAL_CAPSULE | Freq: Every day | ORAL | Status: DC
  Administered 2015-10-12 – 2015-10-13 (×2): 180 mg via ORAL
  Filled 2015-10-12 (×2): qty 1

## 2015-10-12 MED ORDER — DEXAMETHASONE SODIUM PHOSPHATE 10 MG/ML IJ SOLN
10.0000 mg | Freq: Once | INTRAMUSCULAR | Status: AC
  Administered 2015-10-12: 10 mg via INTRAVENOUS
  Filled 2015-10-12: qty 1

## 2015-10-12 MED ORDER — ONDANSETRON HCL 4 MG PO TABS: 4.0000 mg | ORAL_TABLET | Freq: Four times a day (QID) | ORAL | Status: DC | PRN

## 2015-10-12 MED ORDER — OXYCODONE HCL 5 MG PO TABS
5.0000 mg | ORAL_TABLET | ORAL | Status: DC | PRN
  Administered 2015-10-12 – 2015-10-13 (×2): 5 mg via ORAL
  Filled 2015-10-12 (×2): qty 1

## 2015-10-12 MED ORDER — ASPIRIN 81 MG PO CHEW
324.0000 mg | CHEWABLE_TABLET | Freq: Once | ORAL | Status: AC
  Administered 2015-10-12: 324 mg via ORAL
  Filled 2015-10-12: qty 4

## 2015-10-12 MED ORDER — DEXTROSE 50 % IV SOLN
INTRAVENOUS | Status: AC
  Administered 2015-10-12: 25 mL via INTRAVENOUS
  Filled 2015-10-12: qty 50

## 2015-10-12 MED ORDER — GADOBENATE DIMEGLUMINE 529 MG/ML IV SOLN
17.0000 mL | Freq: Once | INTRAVENOUS | Status: AC | PRN
  Administered 2015-10-12: 17 mL via INTRAVENOUS

## 2015-10-12 MED ORDER — INFLUENZA VAC SPLIT QUAD 0.5 ML IM SUSY
0.5000 mL | PREFILLED_SYRINGE | INTRAMUSCULAR | Status: AC
  Administered 2015-10-13: 0.5 mL via INTRAMUSCULAR
  Filled 2015-10-12 (×2): qty 0.5

## 2015-10-12 MED ORDER — ONDANSETRON HCL 4 MG/2ML IJ SOLN: 4.0000 mg | Freq: Four times a day (QID) | INTRAMUSCULAR | Status: DC | PRN

## 2015-10-12 MED ORDER — VENLAFAXINE HCL ER 75 MG PO CP24
75.0000 mg | ORAL_CAPSULE | Freq: Every day | ORAL | Status: DC
  Administered 2015-10-12 – 2015-10-13 (×2): 75 mg via ORAL
  Filled 2015-10-12 (×2): qty 1

## 2015-10-12 MED ORDER — ONDANSETRON HCL 4 MG/2ML IJ SOLN
4.0000 mg | Freq: Once | INTRAMUSCULAR | Status: AC
  Administered 2015-10-12: 4 mg via INTRAVENOUS
  Filled 2015-10-12: qty 2

## 2015-10-12 NOTE — Progress Notes (Signed)
*  PRELIMINARY RESULTS* Echocardiogram 2D Echocardiogram has been performed.  Linda Vargas 10/12/2015, 7:39 PM

## 2015-10-12 NOTE — ED Notes (Addendum)
Stroke response nurse comments: The pt arrived POV at 1009 with chief co chest pain and weakness.  Upon further assessment pt also co rt sided weakness and blurred vision in rt eye.  Pt states she was at baseline at 0730, noticed symptoms for the first time at 0930.  Upon MD assessment, initial NIHSS was 6.  Teleneurologist on screen at 11:28 (8 minutes after [facility] call) to assess the patient.  Pt has improving symptoms, pt declined tPA after lengthy discussion with family (10-15 minutes).  Pt's NIHSS currently 4.  Plan is to admit the pt, no tPA given.  Pt's primary nurse Bill RN aware of the plan and will perform VS and Neuro Q30 while the pt remains inside the 4.5 hr window, then VS and neuro Q2h x 12h or upon neuro status change.

## 2015-10-12 NOTE — ED Notes (Addendum)
Pt presents with chest heaviness and shortness of breath for two weeks. Pt with hx of lupus. Also c/o right arm numbness and feels like something is in her right eye.

## 2015-10-12 NOTE — H&P (Signed)
[place] [facility] [place] NAME: Linda Vargas    MR#:  [id number]  DATE OF BIRTH:  21-Dec-1975  DATE OF ADMISSION:  10/12/2015  PRIMARY CARE PHYSICIAN: [facility]  REQUESTING/REFERRING PHYSICIAN: Dr. Lisa Roca  CHIEF COMPLAINT:   Chief Complaint  Patient presents with  . Chest Pain  . Shortness of Breath    HISTORY OF PRESENT ILLNESS:  Linda Vargas  is a 39 y.o. female with a known history of undifferentiated connective tissue disorder, lupus, anxiety, fibromyalgia and migraines comes secondary to right sided weakness. Stent has occasional bilateral lower extremity pains from her connective tissue disease. She usually takes tramadol as needed and gets better after rest. She also has migraine headaches which are more like sinus pressure headaches on one side. This morning it started with bilateral lower extremity pains which were worse than normal. And then she also had some chest pressure. So she called her Lupus clinic and they advised her to come to the emergency room. Denies any chest pain at this time. She also developed right arm numbness and weakness and says she couldn't feel her arm since this morning. Still feels about the same.  Follows with a rheumatologist at [facility]. Not on prednisone currently. Last prednisone taper was in October 2016. Migraine headaches never been associated with hemiplegic symptoms. No fevers or chills or nausea, vomiting. No recent travel or sick contacts. No speech changes, no tingling.  PAST MEDICAL HISTORY:   Past Medical History  Diagnosis Date  . Hypertension   . Seasonal allergic rhinitis   . Lupus ([place])   . Anxiety   . Migraine   . Connective tissue disorder (HCC)     undifferentiated  . Fibromyalgia     PAST SURGICAL HISTORY:   Past Surgical History  Procedure Laterality Date  . Dilation and curettage of uterus  2011  . Foot surgery    . Cholecystectomy  2012  .  Tonsillectomy    . Dilation and curettage of uterus  01/17/2012    Procedure: DILATATION AND CURETTAGE;  Surgeon: Marylynn Pearson, MD;  Location: [facility];  Service: Gynecology;  Laterality: N/A;  Dilatation and curratage, insertion of Bakri Balloon    SOCIAL HISTORY:   Social History  Substance Use Topics  . Smoking status: Never Smoker   . Smokeless tobacco: Never Used  . Alcohol Use: No    FAMILY HISTORY:   Family History  Problem Relation Age of Onset  . Hypertension Mother   . Hypertension Father   . Diabetes Father   . Heart disease Maternal Grandmother   . Heart disease Paternal Grandmother   . Anesthesia problems Neg Hx     DRUG ALLERGIES:   Allergies  Allergen Reactions  . Bean Pod Extract Hives  . Ibuprofen Swelling    Lips swell  . Peanut-Containing Drug Products Hives    REVIEW OF SYSTEMS:   Review of Systems  Constitutional: Negative for fever, chills, weight loss and malaise/fatigue.  HENT: Positive for congestion. Negative for ear discharge, ear pain, hearing loss, nosebleeds and tinnitus.   Eyes: Positive for blurred vision and pain. Negative for double vision and photophobia.  Respiratory: Negative for cough, hemoptysis, shortness of breath and wheezing.   Cardiovascular: Positive for chest pain. Negative for palpitations, orthopnea and leg swelling.  Gastrointestinal: Negative for heartburn, nausea, vomiting, abdominal pain, diarrhea, constipation and melena.  Genitourinary: Negative for dysuria, urgency, frequency and hematuria.  Musculoskeletal: Positive for myalgias,  back pain and joint pain. Negative for neck pain.  Skin: Negative for rash.  Neurological: Positive for sensory change, focal weakness, weakness and headaches. Negative for dizziness, tingling, tremors and speech change.  Endo/Heme/Allergies: Does not bruise/bleed easily.  Psychiatric/Behavioral: Negative for depression.    MEDICATIONS AT HOME:   Prior to Admission medications    Medication Sig Start Date End Date Taking? Authorizing Provider  acetaminophen (TYLENOL) 500 MG tablet Take 1,000 mg by mouth every 6 (six) hours as needed.   Yes Historical Provider, MD  ALLEGRA-D ALLERGY & CONGESTION 60-120 MG 12 hr tablet Take 1 tablet by mouth daily. 09/17/15  Yes Historical Provider, MD  hydroxychloroquine (PLAQUENIL) 200 MG tablet Take 200 mg by mouth 2 (two) times daily.     Yes Historical Provider, MD  venlafaxine XR (EFFEXOR-XR) 75 MG 24 hr capsule Take 1 capsule by mouth daily. 10/08/15  Yes Historical Provider, MD  verapamil (VERELAN PM) 180 MG 24 hr capsule Take 1 capsule by mouth daily. 09/17/15  Yes Historical Provider, MD  ferrous sulfate 325 (65 FE) MG tablet Take 1 tablet (325 mg total) by mouth 3 (three) times daily with meals. 01/20/12 01/19/13  Juanda Chance, NP      VITAL SIGNS:  Blood pressure 163/97, pulse 66, temperature 98.1 F (36.7 C), temperature source Oral, resp. rate 19, height 5\' 9"  (1.753 m), weight 85.73 kg (189 lb), last menstrual period 09/21/2015, SpO2 100 %.  PHYSICAL EXAMINATION:   Physical Exam  GENERAL:  39 y.o.-year-old patient lying in the bed with no acute distress.  EYES: Pupils equal, round, reactive to light and accommodation. No scleral icterus. Extraocular muscles intact. No photosensitivity on exam Tearing of both her eyes noted. HEENT: Head atraumatic, normocephalic. Oropharynx and nasopharynx clear.  NECK:  Supple, no jugular venous distention. No thyroid enlargement, no tenderness.  LUNGS: Normal breath sounds bilaterally, no wheezing, rales,rhonchi or crepitation. No use of accessory muscles of respiration.  CARDIOVASCULAR: S1, S2 normal. No murmurs, rubs, or gallops.  ABDOMEN: Soft, nontender, nondistended. Bowel sounds present. No organomegaly or mass.  EXTREMITIES: No pedal edema, cyanosis, or clubbing.  NEUROLOGIC: Cranial nerves II through XII are intact. Muscle strength 5/5 in all extremities. Has a right wrist flexion  posture due to weakness, but able to hold in air by herself and good grip strength. Sensation intact. Gait not checked.  1+ right knee jerk, 2+ left knee jerk- has chronic right knee issues PSYCHIATRIC: The patient is alert and oriented x 3.  SKIN: No obvious rash, lesion, or ulcer.   LABORATORY PANEL:   CBC  Recent Labs Lab 10/12/15 1106  WBC 4.8  HGB 13.8  HCT 41.5  PLT 305   ------------------------------------------------------------------------------------------------------------------  Chemistries   Recent Labs Lab 10/12/15 1106  NA 138  K 3.7  CL 107  CO2 25  GLUCOSE 61*  BUN 13  CREATININE 1.03*  CALCIUM 9.3   ------------------------------------------------------------------------------------------------------------------  Cardiac Enzymes  Recent Labs Lab 10/12/15 1106  TROPONINI <0.03   ------------------------------------------------------------------------------------------------------------------  RADIOLOGY:  Dg Chest 2 View  10/12/2015  CLINICAL DATA:  Chest pain and shortness of Breath EXAM: CHEST - 2 VIEW COMPARISON:  02/08/2009 FINDINGS: The heart size and mediastinal contours are within normal limits. Both lungs are clear. The visualized skeletal structures are unremarkable. IMPRESSION: No active disease. Electronically Signed   By: Inez Catalina M.D.   On: 10/12/2015 10:44   Ct Head Wo Contrast  10/12/2015  CLINICAL DATA:  Right-sided facial, arm and leg weakness EXAM:  CT HEAD WITHOUT CONTRAST TECHNIQUE: Contiguous axial images were obtained from the base of the skull through the vertex without intravenous contrast. COMPARISON:  07/29/2012 FINDINGS: The bony calvarium is intact. The ventricles are of normal size and configuration. No findings to suggest acute hemorrhage, acute infarction or space-occupying mass lesion are noted. IMPRESSION: No acute intracranial abnormality noted. These results were called by telephone at the time of interpretation  on 10/12/2015 at 11:27 am to Dr. Lisa Roca , who verbally acknowledged these results. Electronically Signed   By: Inez Catalina M.D.   On: 10/12/2015 11:27    EKG:   Orders placed or performed during the hospital encounter of 10/12/15  . ED EKG within 10 minutes  . ED EKG within 10 minutes  . ED EKG  . ED EKG    IMPRESSION AND PLAN:   Linda Vargas  is a 39 y.o. female with a known history of undifferentiated connective tissue disorder, lupus, anxiety, fibromyalgia and migraines comes secondary to right sided weakness.  #1 TIA- Admit under obs, tele monitoring - likely functional right sided weakness or ? hemiplegic migraine  - 1 dose of IV decadron per neuro reccs - Neuro checks, neurology consult - MRI with and without contrast with her h/o lupus, MRA and ECHO - check lipid panel - PT consult for ambulation - one episode of chest pressure, troponins  #2 Lupus/ undifferentiated connective tissue disorder- check ANA, anti Ds DNA, C3, C4 levels to check a flare. Hold off on prednisone for now- Last prednisone taper was in oct2016 Outpatient follow up recommended Receives trochanteric bursitis injections at [facility]  #3 Fibromyalgia- pain mgmt as tolerated. Has chronic fatigue as well  #4 Depression/Anxiety- on effexor- continue  #5 DVT Prophylaxis- on lovenox  Possible discharge tomorrow    All the records are reviewed and case discussed with ED provider. Management plans discussed with the patient, family and they are in agreement.  CODE STATUS: Full Code  TOTAL TIME TAKING CARE OF THIS PATIENT: 50 minutes.    Perlie Scheuring M.D on 10/12/2015 at 1:16 PM  Between 7am to 6pm - Pager - [phone]  After 6pm go to [email or web address] - password [email or web address]  [facility]  Office  [phone]  CC: Primary care physician; No primary care provider on file.

## 2015-10-12 NOTE — Progress Notes (Signed)
   10/12/15 1608  Clinical Encounter Type  Visited With Patient and family together  Visit Type Follow-up  Referral From Nurse  Consult/Referral To Chaplain  Spiritual Encounters  Spiritual Needs Literature  Advance Directives (For Healthcare)  Does patient have an advance directive? No  Would patient like information on creating an advanced directive? Yes - Spiritual care consult ordered;Yes - Scientist, clinical (histocompatibility and immunogenetics) given  Type of Academic librarian  Provided pastoral presence, support and Forensic scientist education and forms.  Pt wishes chaplain to follow up tomorrow.  Will tell next on call chaplain when they come in tomorrow.  [place] [phone]

## 2015-10-12 NOTE — Progress Notes (Signed)
   10/12/15 1100  Clinical Encounter Type  Visited With Patient and family together  Visit Type Code  Referral From Nurse  Consult/Referral To Chaplain  Responded to Code Stroke page.  Pt was responsive and talking, but said she has a headache when I arrived.  Pt's aunt was present.  Pt and family member thanked me for my visit.  No needs identified at this time.  [place] [phone]

## 2015-10-12 NOTE — ED Provider Notes (Signed)
[facility] Emergency Department Provider Note   ____________________________________________  Time seen: 11 AM I have reviewed the triage vital signs and the triage nursing note.  HISTORY  Chief Complaint Chest Pain and Shortness of Breath   Historian Patient  HPI Linda Vargas is a 39 y.o. female with a history of lupus and hypertension and is here for evaluation of right arm numbness. Patient states that for the past couple days she's had bilateral leg pain which is something that she's experienced before with lupus. This morning she was on the phone with her dad around 6:30 AM and was not noting any numbness at that point in time. When he talked to her again around 9:30 AM, she was at that point experiencing right arm numbness. After she spoke with the [place] at [facility] a minute later, she was recommended to come to the emergency Department due to weakness of her arm.  Patient's reporting moderate central chest pressure with mild shortness of breath. No history of prior MI.  She does not take blood thinners. No history of stroke.  Symptoms are moderate to severe, in terms of weakness of the right arm and right leg and numbness of the right face, right leg and right arm.    Past Medical History  Diagnosis Date  . Hypertension   . Seasonal allergic rhinitis   . Lupus ([place])   . Anxiety   . Migraine     Patient Active Problem List   Diagnosis Date Noted  . Pregnancy, supervision of, high-risk 06/27/2011  . Lupus ([place]) 06/27/2011  . 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 Vaginal bleeding in pregnancy 06/27/2011  . Vaginal lesion 06/27/2011    Past Surgical History  Procedure Laterality Date  . Dilation and curettage of uterus  2011  . Foot surgery    . Cholecystectomy  2012  . Tonsillectomy    . Dilation and curettage of uterus  01/17/2012    Procedure: DILATATION AND CURETTAGE;  Surgeon: Marylynn Pearson, MD;  Location: [facility];  Service:  Gynecology;  Laterality: N/A;  Dilatation and curratage, insertion of Bakri Balloon    Current Outpatient Rx  Name  Route  Sig  Dispense  Refill  . acetaminophen (TYLENOL) 500 MG tablet   Oral   Take 1,000 mg by mouth every 6 (six) hours as needed.         Lianne Moris ALLERGY & CONGESTION 60-120 MG 12 hr tablet   Oral   Take 1 tablet by mouth daily.      5     Dispense as written.   . hydroxychloroquine (PLAQUENIL) 200 MG tablet   Oral   Take 200 mg by mouth 2 (two) times daily.           Marland Kitchen venlafaxine XR (EFFEXOR-XR) 75 MG 24 hr capsule   Oral   Take 1 capsule by mouth daily.      2   . verapamil (VERELAN PM) 180 MG 24 hr capsule   Oral   Take 1 capsule by mouth daily.      5   . EXPIRED: ferrous sulfate 325 (65 FE) MG tablet   Oral   Take 1 tablet (325 mg total) by mouth 3 (three) times daily with meals.   90 tablet   1     Allergies Bean pod extract; Ibuprofen; and Peanut-containing drug products  Family History  Problem Relation Age of Onset  . Hypertension Mother   . Hypertension Father   .  Diabetes Father   . Heart disease Maternal Grandmother   . Heart disease Paternal Grandmother   . Anesthesia problems Neg Hx     Social History Social History  Substance Use Topics  . Smoking status: Never Smoker   . Smokeless tobacco: Never Used  . Alcohol Use: No    Review of Systems  Constitutional: Negative for fever. Eyes: Negative for visual changes. ENT: Negative for sore throat. Cardiovascular: Positive for chest pain. Respiratory: Positive for shortness of breath. Gastrointestinal: Negative for abdominal pain, vomiting and diarrhea. Genitourinary: Negative for dysuria. Musculoskeletal: Negative for back pain. Skin: Negative for rash. Neurological: Negative for headache. 10 point Review of Systems otherwise negative ____________________________________________   PHYSICAL EXAM:  VITAL SIGNS: ED Triage Vitals  Enc Vitals Group     BP  10/12/15 1013 168/91 mmHg     Pulse Rate 10/12/15 1101 80     Resp 10/12/15 1013 20     Temp 10/12/15 1013 98.1 F (36.7 C)     Temp Source 10/12/15 1013 Oral     SpO2 10/12/15 1013 100 %     Weight 10/12/15 1011 189 lb (85.73 kg)     Height 10/12/15 1011 5\' 9"  (1.753 m)     Head Cir --      Peak Flow --      Pain Score 10/12/15 1012 10     Pain Loc --      Pain Edu? --      Excl. in [place]? --      Constitutional: Alert and oriented. Well appearing and in no distress. Somewhat anxious. Eyes: Conjunctivae are normal. PERRL. Normal extraocular movements. Funduscopic exam normal bilaterally ENT   Head: Normocephalic and atraumatic.   Nose: No congestion/rhinnorhea.   Mouth/Throat: Mucous membranes are moist.   Neck: No stridor. Cardiovascular/Chest: Normal rate, regular rhythm.  No murmurs, rubs, or gallops. Respiratory: Normal respiratory effort without tachypnea nor retractions. Breath sounds are clear and equal bilaterally. No wheezes/rales/rhonchi. Gastrointestinal: Soft. No distention, no guarding, no rebound. Nontender   Genitourinary/rectal:Deferred Musculoskeletal: Nontender with normal range of motion in all extremities. No joint effusions.  No lower extremity tenderness.  No edema. Neurologic:  Normal speech and language. No facial droop. Numbness right face, V1, V2 and V3. 3 out of 5 strength in the right upper and lower extremity. Normal strength 5 out of 5 of the left arm and leg. Skin:  Skin is warm, dry and intact. No rash noted. Psychiatric: Mood and affect are normal. Speech and behavior are normal. Patient exhibits appropriate insight and judgment.  ____________________________________________   EKG I, Lisa Roca, MD, the attending physician have personally viewed and interpreted all ECGs.  70 bpm. Normal sinus rhythm with sinus arrhythmia. Narrow QRS. Normal axis. Normal ST and T-wave ____________________________________________  LABS (pertinent  positives/negatives)  Fingerstick glucose 59 Basic metabolic panel without significant abnormality. Troponin less than 0.03 CBC within normal limits  ____________________________________________  RADIOLOGY All Xrays were viewed by me. Imaging interpreted by Radiologist.  CT head noncontrast:  No acute intracranial abnormalities noted.. Discussed this result with the radiologist.  Chest: No active disease __________________________________________  PROCEDURES  Procedure(s) performed: None  Critical Care performed: CRITICAL CARE Performed by: Lisa Roca   Total critical care time: 60 minutes  Critical care time was exclusive of separately billable procedures and treating other patients.  Critical care was necessary to treat or prevent imminent or life-threatening deterioration.  Critical care was time spent personally by me on the following  activities: development of treatment plan with patient and/or surrogate as well as nursing, discussions with consultants, evaluation of patient's response to treatment, examination of patient, obtaining history from patient or surrogate, ordering and performing treatments and interventions, ordering and review of laboratory studies, ordering and review of radiographic studies, pulse oximetry and re-evaluation of patient's condition.   ____________________________________________   ED COURSE / ASSESSMENT AND PLAN  CONSULTATIONS: Tele-neurology, I was at the bedside with the neurologist as he examined and discussed risks versus benefits of TPA with the patient. Patient chose no TPA.  Hospitalist to admit.    Pertinent labs & imaging results that were available during my care of the patient were reviewed by me and considered in my medical decision making (see chart for details).     Chief complaint was chest pressure, however I was called to bedside when the nurse noted acute right-sided paresthesia and weakness. Indeed the  patient does have significant right-sided arm and leg weakness, 3 out of 5 and numbness to the right face, right arm and right leg. NIH stroke score of 7; 3, 3, and 1 for upper extremity, lower extremity weakness and sensory changes.  Time of onset taken to be 9:30 AM, slightly unclear by patient as she states normal at 6:30, but definititely noted weakness while on phone with her father at 9:30.   Given this time of onset at 9:30am, patient is within a TPA window potentially. Code stroke was initiated and patient sent to CT while neurology consultation was initiated.   Head CT report called to me as negative.  I was in the room as the neurologist performed history and physical examination through myself and the nurse. The patient's exam does seem to be slightly different than when I initially saw her, the weakness seems to be more difficulty of apraxia than true weakness.  Initially when raising her right arm it dropped to the bed, however now when the arm is placed in the elevated position, patient is unable to put it down on her own.  The consulting neurologist feels like the patient's exam is somewhat contradictory, and not clearly a stroke. Other differential diagnosis would include vasculitis, and atypical migraine.  However given the chance of stroke, and the patient is within window for consideration of TPA, a lengthy discussion was had with both the patient and her aunt, myself in the neurologist.  In this case there is not a strong recommendation from the neurologist for or against TPA.  She does have a complex and somewhat contradictory but improving neurologic exam. The patient is concerned about the risk associated with TPA, and elects not to receive TPA. Patient will be treated with aspirin, and sent for the rest of the stroke/neurologic deficit evaluation. I discussed this case with the admitting hospitalist, Dr. Earleen Newport, who will admit the patient.    Patient / Family / Caregiver  informed of clinical course, medical decision-making process, and agree with plan.    ___________________________________________   FINAL CLINICAL IMPRESSION(S) / ED DIAGNOSES   Final diagnoses:  Right arm weakness  Right leg weakness  Chest pain, unspecified chest pain type   apraxia     Lisa Roca, MD 10/12/15 1316

## 2015-10-12 NOTE — Consult Note (Signed)
CC: R sided weakness   HPI: Linda Vargas is an 39 y.o. female female with a known history of undifferentiated connective tissue disorder, lupus, anxiety, fibromyalgia and migraines comes secondary to right sided weakness. Right arm weakness started today morning, also has been having bilateral lower extremity weakness. Pt's symptoms come and go.    Past Medical History  Diagnosis Date  . Hypertension   . Seasonal allergic rhinitis   . Lupus ([place])   . Anxiety   . Migraine   . Connective tissue disorder ([facility])     undifferentiated  . Fibromyalgia     Past Surgical History  Procedure Laterality Date  . Dilation and curettage of uterus  2011  . Foot surgery    . Cholecystectomy  2012  . Tonsillectomy    . Dilation and curettage of uterus  01/17/2012    Procedure: DILATATION AND CURETTAGE;  Surgeon: Marylynn Pearson, MD;  Location: [facility];  Service: Gynecology;  Laterality: N/A;  Dilatation and curratage, insertion of Bakri Balloon    Family History  Problem Relation Age of Onset  . Hypertension Mother   . Hypertension Father   . Diabetes Father   . Heart disease Maternal Grandmother   . Heart disease Paternal Grandmother   . Anesthesia problems Neg Hx     Social History:  reports that she has never smoked. She has never used smokeless tobacco. She reports that she does not drink alcohol or use illicit drugs.  Allergies  Allergen Reactions  . Bean Pod Extract Hives  . Ibuprofen Swelling    Lips swell  . Peanut-Containing Drug Products Hives    Medications: I have reviewed the patient's current medications.  ROS: History obtained from the patient  General ROS: negative for - chills, fatigue, fever, night sweats, weight gain or weight loss Psychological ROS: Anxiety Ophthalmic ROS: negative for - blurry vision, double vision, eye pain or loss of vision ENT ROS: negative for - epistaxis, nasal discharge, oral lesions, sore throat, tinnitus or vertigo Allergy and  Immunology ROS: negative for - hives or itchy/watery eyes Hematological and Lymphatic ROS: negative for - bleeding problems, bruising or swollen lymph nodes Endocrine ROS: negative for - galactorrhea, hair pattern changes, polydipsia/polyuria or temperature intolerance Respiratory ROS: negative for - cough, hemoptysis, shortness of breath or wheezing Cardiovascular ROS: negative for - chest pain, dyspnea on exertion, edema or irregular heartbeat Gastrointestinal ROS: negative for - abdominal pain, diarrhea, hematemesis, nausea/vomiting or stool incontinence Genito-Urinary ROS: negative for - dysuria, hematuria, incontinence or urinary frequency/urgency Musculoskeletal ROS: negative for - joint swelling or muscular weakness Neurological ROS: as noted in HPI Dermatological ROS: negative for rash and skin lesion changes  Physical Examination: Blood pressure 163/97, pulse 66, temperature 98.1 F (36.7 C), temperature source Oral, resp. rate 19, height 5\' 9"  (1.753 m), weight 189 lb (85.73 kg), last menstrual period 09/21/2015, SpO2 100 %.  Neurological Examination Mental Status: Alert, oriented, thought content appropriate.  Speech fluent without evidence of aphasia.  Able to follow 3 step commands without difficulty. Cranial Nerves: II: Discs flat bilaterally; Visual fields grossly normal, pupils equal, round, reactive to light and accommodation III,IV, VI: ptosis not present, extra-ocular motions intact bilaterally V,VII: smile symmetric, facial light touch sensation normal bilaterally VIII: hearing normal bilaterally IX,X: gag reflex present XI: bilateral shoulder shrug XII: midline tongue extension Motor: Right : Upper extremity   5/5 Lots of giveaway weakness RUE and RLE  Left:     Upper extremity  5/5  Lower extremity   5/5     Lower extremity   5/5 Tone and bulk:normal tone throughout; no atrophy noted Sensory: Pinprick and light touch intact throughout, bilaterally Deep Tendon  Reflexes: 2+ and symmetric throughout Plantars: Right: downgoing   Left: downgoing Cerebellar: Not tested Gait: not tested      Laboratory Studies:   Basic Metabolic Panel:  Recent Labs Lab 10/12/15 1106  NA 138  K 3.7  CL 107  CO2 25  GLUCOSE 61*  BUN 13  CREATININE 1.03*  CALCIUM 9.3    Liver Function Tests: No results for input(s): AST, ALT, ALKPHOS, BILITOT, PROT, ALBUMIN in the last 168 hours. No results for input(s): LIPASE, AMYLASE in the last 168 hours. No results for input(s): AMMONIA in the last 168 hours.  CBC:  Recent Labs Lab 10/12/15 1106  WBC 4.8  HGB 13.8  HCT 41.5  MCV 86.1  PLT 305    Cardiac Enzymes:  Recent Labs Lab 10/12/15 1106  TROPONINI <0.03    BNP: Invalid input(s): POCBNP  CBG:  Recent Labs Lab 10/12/15 1155  GLUCAP 58*    Microbiology: Results for orders placed or performed during the hospital encounter of 01/16/12  Culture, beta strep (group b only)     Status: None   Collection Time: 01/05/12 12:00 AM  Result Value Ref Range Status   Organism ID, Bacteria negative    Strep B DNA probe     Status: None   Collection Time: 01/05/12 12:00 AM  Result Value Ref Range Status   GBS Negative      Coagulation Studies: No results for input(s): LABPROT, INR in the last 72 hours.  Urinalysis: No results for input(s): COLORURINE, LABSPEC, PHURINE, GLUCOSEU, HGBUR, BILIRUBINUR, KETONESUR, PROTEINUR, UROBILINOGEN, NITRITE, LEUKOCYTESUR in the last 168 hours.  Invalid input(s): APPERANCEUR  Lipid Panel:  No results found for: CHOL, TRIG, HDL, CHOLHDL, VLDL, LDLCALC  HgbA1C: No results found for: HGBA1C  Urine Drug Screen:  No results found for: LABOPIA, COCAINSCRNUR, LABBENZ, AMPHETMU, THCU, LABBARB  Alcohol Level: No results for input(s): ETH in the last 168 hours.  Other results: EKG: normal EKG, normal sinus rhythm, unchanged from previous tracings.  Imaging: Dg Chest 2 View  10/12/2015  CLINICAL DATA:   Chest pain and shortness of Breath EXAM: CHEST - 2 VIEW COMPARISON:  02/08/2009 FINDINGS: The heart size and mediastinal contours are within normal limits. Both lungs are clear. The visualized skeletal structures are unremarkable. IMPRESSION: No active disease. Electronically Signed   By: Inez Catalina M.D.   On: 10/12/2015 10:44   Ct Head Wo Contrast  10/12/2015  CLINICAL DATA:  Right-sided facial, arm and leg weakness EXAM: CT HEAD WITHOUT CONTRAST TECHNIQUE: Contiguous axial images were obtained from the base of the skull through the vertex without intravenous contrast. COMPARISON:  07/29/2012 FINDINGS: The bony calvarium is intact. The ventricles are of normal size and configuration. No findings to suggest acute hemorrhage, acute infarction or space-occupying mass lesion are noted. IMPRESSION: No acute intracranial abnormality noted. These results were called by telephone at the time of interpretation on 10/12/2015 at 11:27 am to Dr. Lisa Roca , who verbally acknowledged these results. Electronically Signed   By: Inez Catalina M.D.   On: 10/12/2015 11:27     Assessment/Plan: 39 y.o. female female with a known history of undifferentiated connective tissue disorder, lupus, anxiety, fibromyalgia and migraines comes secondary to right sided weakness. Right arm weakness started today morning, also has been having bilateral  lower extremity weakness. Pt's symptoms come and go.    Pt's exam is functional no true weakness on her RUE and RLE with giveaway weakness .  Still possible headache vs anxiety provoking. Pt is crying during examination  Also complaining of chest discomfort.   EKG and troponin MRI brain w/ and w/out as as well as MRA head reviewed. No acute preliminary findings seen.  Decadron 10 mg and IV Mg.  D/c planning if cardiac work up is negative.  10/12/2015, 2:23 PM

## 2015-10-12 NOTE — Plan of Care (Signed)
Problem: Spiritual Needs Goal: Ability to function at adequate level Outcome: Progressing Plan of care progress: -MRI, CT negative -continue to monitor pt -IV fluids  -IV decadron -neurology to consult -neuro checks every 4 hours

## 2015-10-13 LAB — CBC
HCT: 37.6 % (ref 35.0–47.0)
Hemoglobin: 12.6 g/dL (ref 12.0–16.0)
MCH: 28.8 pg (ref 26.0–34.0)
MCHC: 33.6 g/dL (ref 32.0–36.0)
MCV: 85.8 fL (ref 80.0–100.0)
Platelets: 304 10*3/uL (ref 150–440)
RBC: 4.38 MIL/uL (ref 3.80–5.20)
RDW: 12.3 % (ref 11.5–14.5)
WBC: 9.7 10*3/uL (ref 3.6–11.0)

## 2015-10-13 LAB — TROPONIN I: Troponin I: <0.03 ng/mL (ref <0.031)

## 2015-10-13 LAB — BASIC METABOLIC PANEL
Anion gap: 6 (ref 5–15)
BUN: 13 mg/dL (ref 6–20)
CO2: 21 mmol/L — ABNORMAL LOW (ref 22–32)
Calcium: 8.6 mg/dL — ABNORMAL LOW (ref 8.9–10.3)
Chloride: 108 mmol/L (ref 101–111)
Creatinine, Ser: 0.97 mg/dL (ref 0.44–1.00)
GFR calc Af Amer: >60 mL/min (ref >60)
GFR calc non Af Amer: >60 mL/min (ref >60)
Glucose, Bld: 129 mg/dL — ABNORMAL HIGH (ref 65–99)
Potassium: 4.8 mmol/L (ref 3.5–5.1)
Sodium: 135 mmol/L (ref 135–145)

## 2015-10-13 LAB — ANA COMPREHENSIVE PANEL
Anti JO-1: <0.2 AI (ref 0.0–0.9)
Centromere Ab Screen: <0.2 AI (ref 0.0–0.9)
Chromatin Ab SerPl-aCnc: <0.2 AI (ref 0.0–0.9)
ENA SM Ab Ser-aCnc: <0.2 AI (ref 0.0–0.9)
Ribonucleic Protein: <0.2 AI (ref 0.0–0.9)
SSA (Ro) (ENA) Antibody, IgG: 1.3 AI — ABNORMAL HIGH (ref 0.0–0.9)
SSB (La) (ENA) Antibody, IgG: <0.2 AI (ref 0.0–0.9)
Scleroderma (Scl-70) (ENA) Antibody, IgG: <0.2 AI (ref 0.0–0.9)
ds DNA Ab: 1 IU/mL (ref 0–9)

## 2015-10-13 LAB — C3 COMPLEMENT: C3 Complement: 110 mg/dL (ref 82–167)

## 2015-10-13 LAB — C4 COMPLEMENT: Complement C4, Body Fluid: 26 mg/dL (ref 14–44)

## 2015-10-13 MED ORDER — BUTALBITAL-APAP-CAFFEINE 50-325-40 MG PO TABS: 1.0000 | ORAL_TABLET | Freq: Once | ORAL | Status: DC

## 2015-10-13 MED ORDER — BUTALBITAL-APAP-CAFFEINE 50-325-40 MG PO TABS
1.0000 | ORAL_TABLET | Freq: Once | ORAL | Status: AC
  Administered 2015-10-13: 1 via ORAL
  Filled 2015-10-13: qty 1

## 2015-10-13 NOTE — Evaluation (Signed)
Physical Therapy Evaluation Patient Details Name: Linda Vargas MRN: [id number] DOB: [birth year] Today's Date: 10/13/2015   History of Present Illness  Pt is a39yo black female who came to [facility] after onset of HA, dizziness, changes to visual field, vertigo, and loss of sensation/motor function on the R side of her body. All imaging thus far have been nonsupportive of CVA. Pt is being worked up for complications related to a migraine. Pt has a complex medical history including Lupus and anothe rconnective tissue disorder, but does not describe any prior changes  mobility or motor function.   Clinical Impression  Pt is received semirecumbent in bed upon entry, awake, alert, and willing to participate. No acute distress noted. Pt is A&Ox3 and pleasant. Pt reports zero falls in the last 6 months. Pt demonstrating R sided numbness and weakness in UE, weakness> tingling in the LE, and some impaired sensation on the R side of the face. Subsequent complaints, such as headache, vertigo, and dizziness have since resolved. Patient presenting with impairment of strength, range of motion, balance, and activity tolerance, limiting ability to perform ADL and mobility tasks at  baseline level of function. Patient will benefit from skilled intervention to address the above impairments and limitations, in order to restore to prior level of function, improve patient safety upon discharge, and to decrease falls risk. Pt demonstrates mobility and attested social support to warrant safe DC to home when medically stable. I am recommending she continue with outpatient physical therapy for the above impairments and limitations.      Follow Up Recommendations Outpatient PT    Equipment Recommendations  Cane    Recommendations for Other Services       Precautions / Restrictions Precautions Precautions: None Restrictions Weight Bearing Restrictions: No      Mobility  Bed Mobility Overal bed mobility: Modified  Independent                Transfers Overall transfer level: Modified independent Equipment used: None             General transfer comment: 70% weightbearing on Left side due to R glute max weakness.   Ambulation/Gait Ambulation/Gait assistance: Min guard Ambulation Distance (Feet): 160 Feet Assistive device: None Gait Pattern/deviations: Decreased step length - left Gait velocity: 0.77m/s  Gait velocity interpretation: <1.8 ft/sec, indicative of risk for recurrent falls General Gait Details: very slow  Stairs Stairs: Yes Stairs assistance: Min guard Stair Management: One rail Left;Forwards Number of Stairs: 13 General stair comments: Step two gait, leading c Left.   Wheelchair Mobility    Modified Rankin (Stroke Patients Only)       Balance Overall balance assessment: No apparent balance deficits (not formally assessed);Modified Independent                                           Pertinent Vitals/Pain Pain Assessment: No/denies pain    Home Living Family/patient expects to be discharged to:: Private residence Living Arrangements: Children (3yo daughter. ) Available Help at Discharge: Family;Friend(s) Type of Home: Apartment Home Access: Stairs to enter Entrance Stairs-Rails: Psychiatric nurse of Steps: 13 Home Layout: One level   Additional Comments: 2nd floor apartment, no elevator available.     Prior Function Level of Independence: Independent         Comments: historyof R knee dysfunction and fatigue, which limited stair climbing at baseline.  Hand Dominance        Extremity/Trunk Assessment   Upper Extremity Assessment: RUE deficits/detail RUE Deficits / Details: Complete numbness from fingers to shoulder, with some tingling and 20% sensation on the Pollux which started yesterday. Pt is able to form a slow, and greatly reduced grip using the first two digits, with delayed cessation of  gripping. All other MMT is 2+/5, however patient is able to maintain the RU ein high guard posturing once there. Elbow is absent tone.    RUE Sensation: decreased light touch (decreases pressure sensation. )     Lower Extremity Assessment: RLE deficits/detail RLE Deficits / Details: numbness and heaviness with tingling in R foot, which improves with ambulation. 3+/5 strength in R plantar flexors, hamstrings, TFL, and glute max.      Cervical / Trunk Assessment:  (stocking paresthesia on Right face, normal neck and trunk sensation. )  Communication   Communication: No difficulties  Cognition Arousal/Alertness: Awake/alert Behavior During Therapy: WFL for tasks assessed/performed Overall Cognitive Status: Within Functional Limits for tasks assessed                      General Comments      Exercises        Assessment/Plan    PT Assessment Patient needs continued PT services  PT Diagnosis Difficulty walking;Hemiplegia dominant side;Abnormality of gait   PT Problem List Decreased strength;Decreased activity tolerance;Decreased balance;Decreased coordination;Decreased mobility;Decreased knowledge of use of DME  PT Treatment Interventions Gait training;Stair training;Functional mobility training;Therapeutic activities;Therapeutic exercise;Neuromuscular re-education   PT Goals (Current goals can be found in the Care Plan section) Acute Rehab PT Goals Patient Stated Goal: Return to home and back to work on Monday PT Goal Formulation: With patient Time For Goal Achievement: 10/20/15 Potential to Achieve Goals: Fair    Frequency 7X/week   Barriers to discharge        Co-evaluation               End of Session Equipment Utilized During Treatment: Gait belt Activity Tolerance: Patient tolerated treatment well;Patient limited by fatigue Patient left: in bed;with call bell/phone within reach Nurse Communication: Mobility status;Other (comment)    Functional  Assessment Tool Used: Clinical Judgment  Functional Limitation: Carrying, moving and handling objects Carrying, Moving and Handling Objects Current Status [phone]): At least 60 percent but less than 80 percent impaired, limited or restricted Carrying, Moving and Handling Objects Goal Status [phone]): At least 60 percent but less than 80 percent impaired, limited or restricted    Time: 1132-1200 PT Time Calculation (min) (ACUTE ONLY): 28 min   Charges:   PT Evaluation $Initial PT Evaluation Tier I: 1 Procedure     PT G Codes:   PT G-Codes **NOT FOR INPATIENT CLASS** Functional Assessment Tool Used: Clinical Judgment  Functional Limitation: Carrying, moving and handling objects Carrying, Moving and Handling Objects Current Status [id number]): At least 60 percent but less than 80 percent impaired, limited or restricted Carrying, Moving and Handling Objects Goal Status [phone]): At least 60 percent but less than 80 percent impaired, limited or restricted    Londyn Wotton C 10/13/2015, 12:52 PM  12:56 PM  Etta Grandchild, PT, DPT [place] License # [id number]

## 2015-10-13 NOTE — Progress Notes (Signed)
   10/13/15 0900  Clinical Encounter Type  Visited With Patient  Visit Type Follow-up  Referral From Chaplain  Consult/Referral To Chaplain  Spiritual Encounters  Spiritual Needs Prayer  Stress Factors  Patient Stress Factors Health changes  Met w/patient to follow up on AD provided by previous on-call chaplain. Patient anticipates discharge today and will complete documents at home. Provided instruction for her. Provided pastoral care. Chap. Halcyon Heck G. [place]

## 2015-10-13 NOTE — Plan of Care (Signed)
Problem: Acute Rehab PT Goals(only PT should resolve) Goal: Pt Will Ambulate Pt will ambulate independently using a step-through pattern and equal step length for a distances greater than 590ft and gait speed of 1.32m/s to demonstrate the ability to perform safe limited community distance ambulation at discharge.    Goal: PT Additional Goal #1 Pt will demonstrate improved active mobility, strength, and coordination in Right hand as evidenced by ability to sign her name on a piece of paper.

## 2015-10-13 NOTE — Progress Notes (Signed)
Pt being discharged home, father at bedside, discharge instructions and prescription reviewed with pt and father states understanding, pt with no noted complaints at discharge, no distress or discomfort noted, pt to follow up with PCP and inquire about outpt PT

## 2015-10-13 NOTE — Discharge Summary (Signed)
[facility] NAME: Ritaj Lantagne    MR#:  [id number]  DATE OF BIRTH:  20-Apr-1976  DATE OF ADMISSION:  10/12/2015 ADMITTING PHYSICIAN: Gladstone Lighter, MD  DATE OF DISCHARGE: 10/13/2015 PRIMARY CARE PHYSICIAN: No primary care provider on file.    ADMISSION DIAGNOSIS:  TIA (transient ischemic attack) [G45.9] Lupus (HCC) [M32.9] Right leg weakness [R29.898] Right arm weakness [R29.898] Chest pain, unspecified chest pain type [R07.9]  DISCHARGE DIAGNOSIS:  Active Problems:   Right-sided weakness  SECONDARY DIAGNOSIS:   Past Medical History  Diagnosis Date  . Hypertension   . Seasonal allergic rhinitis   . Lupus ([place])   . Anxiety   . Migraine   . Connective tissue disorder (HCC)     undifferentiated  . Fibromyalgia     HOSPITAL COURSE:    39 year old female with a history of undifferentiated connective tissue disorder, lupus and anxiety who presented with right-sided weakness. For further details please refer the H&P.  1. 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 She underwent stroke workup including MRI/MRA of the head which was essentially negative. Neurology was consulted and also felt that this was related to a migraine. She did receive one IV dose of Decadron per neuro recommendations.  2. Lupus/undifferentiated connective tissue disorder: Patient had a NA, anti-dsDNA, C3 and C4 levels that were ordered but these have not resulted. Patient will follow up on Tuesday with her lupus M.D. at [facility] and at that time these results should be resulted.   3. Fibromyalgia: Pain management as tolerated.  4. Depression/anxiety: Continue Effexor 5. Hyperlipidemia: LDL was elevated at 109. Patient should engage in 3-6 months of exercise and diet. Cyst discussed with the patient.  DISCHARGE CONDITIONS AND DIET:  Patient is stable for discharge on a heart healthy diet  CONSULTS  OBTAINED:  Treatment Team:  Leotis Pain, MD  DRUG ALLERGIES:   Allergies  Allergen Reactions  . Bean Pod Extract Hives  . Ibuprofen Swelling    Lips swell  . Peanut-Containing Drug Products Hives    DISCHARGE MEDICATIONS:   Current Discharge Medication List    START taking these medications   Details  butalbital-acetaminophen-caffeine (FIORICET, ESGIC) 50-325-40 MG tablet Take 1 tablet by mouth once. Qty: 14 tablet, Refills: 0      CONTINUE these medications which have NOT CHANGED   Details  acetaminophen (TYLENOL) 500 MG tablet Take 1,000 mg by mouth every 6 (six) hours as needed.    ALLEGRA-D ALLERGY & CONGESTION 60-120 MG 12 hr tablet Take 1 tablet by mouth daily. Refills: 5    hydroxychloroquine (PLAQUENIL) 200 MG tablet Take 200 mg by mouth 2 (two) times daily.      venlafaxine XR (EFFEXOR-XR) 75 MG 24 hr capsule Take 1 capsule by mouth daily. Refills: 2    verapamil (VERELAN PM) 180 MG 24 hr capsule Take 1 capsule by mouth daily. Refills: 5    ferrous sulfate 325 (65 FE) MG tablet Take 1 tablet (325 mg total) by mouth 3 (three) times daily with meals. Qty: 90 tablet, Refills: 1              Today   CHIEF COMPLAINT:  Patient still has a headache but improved. She is concerned about her sensory loss in her right arm. But this is improved as well.   VITAL SIGNS:  Blood pressure 141/79, pulse 64, temperature 97.5 F (36.4 C), temperature source Oral, resp. rate  20, height 5\' 9"  (1.753 m), weight 85.73 kg (189 lb), last menstrual period 09/21/2015, SpO2 100 %.   REVIEW OF SYSTEMS:  Review of Systems  Constitutional: Negative for fever, chills and malaise/fatigue.  HENT: Negative for sore throat.   Eyes: Negative for blurred vision.  Respiratory: Negative for cough, hemoptysis, shortness of breath and wheezing.   Cardiovascular: Negative for chest pain, palpitations and leg swelling.  Gastrointestinal: Negative for nausea, vomiting, abdominal  pain, diarrhea and blood in stool.  Genitourinary: Negative for dysuria.  Musculoskeletal: Negative for back pain.  Neurological: Positive for sensory change. Negative for dizziness, tremors and headaches.  Endo/Heme/Allergies: Does not bruise/bleed easily.     PHYSICAL EXAMINATION:  GENERAL:  39 y.o.-year-old patient lying in the bed with no acute distress.  NECK:  Supple, no jugular venous distention. No thyroid enlargement, no tenderness.  LUNGS: Normal breath sounds bilaterally, no wheezing, rales,rhonchi  No use of accessory muscles of respiration.  CARDIOVASCULAR: S1, S2 normal. No murmurs, rubs, or gallops.  ABDOMEN: Soft, non-tender, non-distended. Bowel sounds present. No organomegaly or mass.  EXTREMITIES: No pedal edema, cyanosis, or clubbing.  PSYCHIATRIC: The patient is alert and oriented x 3.  SKIN: No obvious rash, lesion, or ulcer.  Neuro: No cranial nerve deficits or focal deficits. Sensation is intact to cold. DATA REVIEW:   CBC  Recent Labs Lab 10/13/15 0337  WBC 9.7  HGB 12.6  HCT 37.6  PLT 304    Chemistries   Recent Labs Lab 10/12/15 1106 10/13/15 0337  NA 138 135  K 3.7 4.8  CL 107 108  CO2 25 21*  GLUCOSE 61* 129*  BUN 13 13  CREATININE 1.03* 0.97  CALCIUM 9.3 8.6*  AST 24  --   ALT 19  --   ALKPHOS 72  --   BILITOT 0.6  --     Cardiac Enzymes  Recent Labs Lab 10/12/15 1618 10/12/15 2129 10/13/15 0337  TROPONINI <0.03 <0.03 <0.03    Microbiology Results  @[email or web address] @  RADIOLOGY:  Dg Chest 2 View  10/12/2015  CLINICAL DATA:  Chest pain and shortness of Breath EXAM: CHEST - 2 VIEW COMPARISON:  02/08/2009 FINDINGS: The heart size and mediastinal contours are within normal limits. Both lungs are clear. The visualized skeletal structures are unremarkable. IMPRESSION: No active disease. Electronically Signed   By: Inez Catalina M.D.   On: 10/12/2015 10:44   Ct Head Wo Contrast  10/12/2015  CLINICAL DATA:  Right-sided facial, arm  and leg weakness EXAM: CT HEAD WITHOUT CONTRAST TECHNIQUE: Contiguous axial images were obtained from the base of the skull through the vertex without intravenous contrast. COMPARISON:  07/29/2012 FINDINGS: The bony calvarium is intact. The ventricles are of normal size and configuration. No findings to suggest acute hemorrhage, acute infarction or space-occupying mass lesion are noted. IMPRESSION: No acute intracranial abnormality noted. These results were called by telephone at the time of interpretation on 10/12/2015 at 11:27 am to Dr. Lisa Roca , who verbally acknowledged these results. Electronically Signed   By: Inez Catalina M.D.   On: 10/12/2015 11:27   Mr Angiogram Head Wo Contrast  10/12/2015  CLINICAL DATA:  39 year old hypertensive female with history of lupus, migraines and fibromyalgia presenting with chest heaviness and shortness breath for 2 weeks. Right arm numbness. Abnormal sensation right eye. Subsequent encounter. EXAM: MRI HEAD WITHOUT CONTRAST MRA HEAD WITHOUT CONTRAST TECHNIQUE: Multiplanar, multiecho pulse sequences of the brain and surrounding structures were obtained without intravenous contrast. Angiographic images of the  head were obtained using MRA technique without contrast. COMPARISON:  10/12/2015 head CT.  No comparison brain MR. FINDINGS: MRI HEAD FINDINGS No acute infarct. No intracranial hemorrhage. No intracranial mass or abnormal enhancement. Minimal punctate nonspecific white matter type changes. Similar findings described in patients with migraine headaches and/ or result of lupus. Mild exophthalmos otherwise orbital structures unremarkable. Right maxillary sinus 3 cm polypoid structure may represent retention cyst. Major intracranial vascular structures are patent. Cervical medullary junction and pineal region within normal limits. Slightly asymmetric sella without mass identified. MRA HEAD FINDINGS Exam is slightly motion degraded. Anterior circulation without medium  or large size vessel significant stenosis or occlusion. Slight irregularity of branch vessels and the A1 segment of the right anterior cerebral artery may be related to the mild motion degradation rather than true stenosis or irregularity. There is an extra vessel superior to the left internal carotid artery cavernous segment which extends superior to the left carotid terminus and anterior to the M1 segment of the left middle cerebral artery. This does not have a typical appearance of a vascular malformation and may represent an incidental finding. If further delineation is clinically desired, CT angiogram may then be considered. Codominant vertebral arteries. 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 No associated aneurysm. Irregularity of the left superior cerebellar artery. This may represent limitation of present exam although atherosclerotic type changes or changes of vasculopathy cannot be excluded contributing to this appearance. IMPRESSION: MRI HEAD No acute infarct or intracranial hemorrhage. No intracranial mass or abnormal enhancement. Minimal punctate nonspecific white matter type changes. Similar findings described in patients with migraine headaches and/ or result of lupus. Mild exophthalmos otherwise orbital structures unremarkable. Right maxillary sinus 3 cm polypoid structure may represent retention cyst. MRA HEAD Exam is slightly motion degraded. Anterior circulation without medium or large size vessel significant stenosis or occlusion. Slight irregularity of branch vessels and the A1 segment of the right anterior cerebral artery may be related to the mild motion degradation rather than true stenosis or irregularity. There is an extra vessel superior to the left internal carotid artery cavernous segment which extends superior to the left carotid terminus and anterior to the M1 segment of the left middle cerebral artery. This does not have a  typical appearance of a vascular malformation and may represent an incidental finding. If further delineation is clinically desired, CT angiogram may then be considered. Irregularity of the left superior cerebellar artery. This may represent limitation of present exam although atherosclerotic type changes or changes of vasculopathy cannot be excluded contributing to this appearance. Electronically Signed   By: Genia Del M.D.   On: 10/12/2015 14:59   Mr Jeri Cos [id number] Contrast  10/12/2015  CLINICAL DATA:  39 year old hypertensive female with history of lupus, migraines and fibromyalgia presenting with chest heaviness and shortness breath for 2 weeks. Right arm numbness. Abnormal sensation right eye. Subsequent encounter. EXAM: MRI HEAD WITHOUT CONTRAST MRA HEAD WITHOUT CONTRAST TECHNIQUE: Multiplanar, multiecho pulse sequences of the brain and surrounding structures were obtained without intravenous contrast. Angiographic images of the head were obtained using MRA technique without contrast. COMPARISON:  10/12/2015 head CT.  No comparison brain MR. FINDINGS: MRI HEAD FINDINGS No acute infarct. No intracranial hemorrhage. No intracranial mass or abnormal enhancement. Minimal punctate nonspecific white matter type changes. Similar findings described in patients with migraine headaches and/ or result of lupus. Mild exophthalmos otherwise orbital structures unremarkable. Right maxillary sinus 3 cm polypoid structure may represent retention  cyst. Major intracranial vascular structures are patent. Cervical medullary junction and pineal region within normal limits. Slightly asymmetric sella without mass identified. MRA HEAD FINDINGS Exam is slightly motion degraded. Anterior circulation without medium or large size vessel significant stenosis or occlusion. Slight irregularity of branch vessels and the A1 segment of the right anterior cerebral artery may be related to the mild motion degradation rather than true stenosis  or irregularity. There is an extra vessel superior to the left internal carotid artery cavernous segment which extends superior to the left carotid terminus and anterior to the M1 segment of the left middle cerebral artery. This does not have a typical appearance of a vascular malformation and may represent an incidental finding. If further delineation is clinically desired, CT angiogram may then be considered. Codominant vertebral arteries. 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 No associated aneurysm. Irregularity of the left superior cerebellar artery. This may represent limitation of present exam although atherosclerotic type changes or changes of vasculopathy cannot be excluded contributing to this appearance. IMPRESSION: MRI HEAD No acute infarct or intracranial hemorrhage. No intracranial mass or abnormal enhancement. Minimal punctate nonspecific white matter type changes. Similar findings described in patients with migraine headaches and/ or result of lupus. Mild exophthalmos otherwise orbital structures unremarkable. Right maxillary sinus 3 cm polypoid structure may represent retention cyst. MRA HEAD Exam is slightly motion degraded. Anterior circulation without medium or large size vessel significant stenosis or occlusion. Slight irregularity of branch vessels and the A1 segment of the right anterior cerebral artery may be related to the mild motion degradation rather than true stenosis or irregularity. There is an extra vessel superior to the left internal carotid artery cavernous segment which extends superior to the left carotid terminus and anterior to the M1 segment of the left middle cerebral artery. This does not have a typical appearance of a vascular malformation and may represent an incidental finding. If further delineation is clinically desired, CT angiogram may then be considered. Irregularity of the left superior cerebellar artery.  This may represent limitation of present exam although atherosclerotic type changes or changes of vasculopathy cannot be excluded contributing to this appearance. Electronically Signed   By: Genia Del M.D.   On: 10/12/2015 14:59      Management plans discussed with the patient and she is in agreement. Stable for discharge   Patient should follow up with M[facility]D. at [facility] for lupus.  CODE STATUS:     Code Status Orders        Start     Ordered   10/12/15 1508  Full code   Continuous     10/12/15 1507    Advance Directive Documentation        Most Recent Value   Type of Advance Directive  Healthcare Power of Attorney   Pre-existing out of facility DNR order (yellow form or pink MOST form)     "MOST" Form in Place?        TOTAL TIME TAKING CARE OF THIS PATIENT: 35 minutes.    Note: This dictation was prepared with Dragon dictation along with smaller phrase technology. Any transcriptional errors that result from this process are unintentional.  Machell Wirthlin M.D on 10/13/2015 at 9:19 AM  Between 7am to 6pm - Pager - [phone] After 6pm go to [email or web address] - password [email or web address] [email or web address]  [facility]  Office  [phone]  CC: Primary care physician; No primary care provider on file.

## 2015-10-30 ENCOUNTER — Other Ambulatory Visit
Admission: RE | Admit: 2015-10-30 | Discharge: 2015-10-30 | Disposition: A | Discharge status: Home / Self Care | Payer: BC Managed Care – PPO | Source: Ambulatory Visit | Attending: Neurology | Admitting: Neurology

## 2015-10-30 DIAGNOSIS — Z01812 Encounter for preprocedural laboratory examination: Secondary | ICD-10-CM | POA: Insufficient documentation

## 2015-10-30 LAB — APTT: aPTT: 25 seconds (ref 24–36)

## 2015-11-02 ENCOUNTER — Other Ambulatory Visit: Payer: Self-pay | Admitting: Neurology

## 2015-11-02 DIAGNOSIS — R531 Weakness: Secondary | ICD-10-CM

## 2015-11-02 DIAGNOSIS — R200 Anesthesia of skin: Secondary | ICD-10-CM

## 2015-11-12 ENCOUNTER — Ambulatory Visit
Admission: RE | Admit: 2015-11-12 | Discharge: 2015-11-12 | Disposition: A | Discharge status: Home / Self Care | Payer: BC Managed Care – PPO | Source: Ambulatory Visit | Attending: Neurology | Admitting: Neurology

## 2015-11-12 DIAGNOSIS — R079 Chest pain, unspecified: Secondary | ICD-10-CM | POA: Diagnosis present

## 2015-11-12 DIAGNOSIS — R531 Weakness: Secondary | ICD-10-CM | POA: Insufficient documentation

## 2015-11-12 DIAGNOSIS — R200 Anesthesia of skin: Secondary | ICD-10-CM | POA: Insufficient documentation

## 2015-11-12 LAB — CBC
HCT: 39 % (ref 35.0–47.0)
Hemoglobin: 13 g/dL (ref 12.0–16.0)
MCH: 28.4 pg (ref 26.0–34.0)
MCHC: 33.3 g/dL (ref 32.0–36.0)
MCV: 85.3 fL (ref 80.0–100.0)
Platelets: 329 10*3/uL (ref 150–440)
RBC: 4.57 MIL/uL (ref 3.80–5.20)
RDW: 12.5 % (ref 11.5–14.5)
WBC: 4 10*3/uL (ref 3.6–11.0)

## 2015-11-12 LAB — GLUCOSE, CSF: Glucose, CSF: 48 mg/dL (ref 40–70)

## 2015-11-12 LAB — PROTEIN, CSF: Total  Protein, CSF: 17 mg/dL (ref 15–45)

## 2015-11-12 LAB — CSF CELL COUNT WITH DIFFERENTIAL
Eosinophils, CSF: 0 %
Lymphs, CSF: 83 %
Monocyte-Macrophage-Spinal Fluid: 17 %
Other Cells, CSF: 0
RBC Count, CSF: 0 /mm3 (ref 0–3)
Segmented Neutrophils-CSF: 0 %
Tube #: 1
WBC, CSF: 15 /mm3

## 2015-11-12 LAB — GLUCOSE, RANDOM: Glucose, Bld: 76 mg/dL (ref 65–99)

## 2015-11-12 LAB — HCG, QUANTITATIVE, PREGNANCY: hCG, Beta Chain, Quant, S: <1 m[IU]/mL (ref <5)

## 2015-11-12 LAB — PROTIME-INR
INR: 0.98
Prothrombin Time: 13.2 seconds (ref 11.4–15.0)

## 2015-11-12 LAB — APTT: aPTT: 26 seconds (ref 24–36)

## 2015-11-12 MED ORDER — ACETAMINOPHEN 500 MG PO TABS
1000.0000 mg | ORAL_TABLET | Freq: Four times a day (QID) | ORAL | Status: DC | PRN
  Filled 2015-11-12: qty 2

## 2015-11-12 NOTE — Discharge Instructions (Signed)
Lumbar Puncture °A lumbar puncture, or spinal tap, is a procedure in which a small amount of the fluid that surrounds the brain and spinal cord is removed and examined. The fluid is called the cerebrospinal fluid. This procedure may be done to:  °· Help diagnose various problems, such as meningitis, encephalitis, multiple sclerosis, and AIDS.   °· Remove fluid and relieve pressure that occurs with certain types of headaches.   °· Look for bleeding within the brain and spinal cord areas (central nervous system).   °· Place medicine into the spinal fluid.   °LET YOUR HEALTH CARE PROVIDER KNOW ABOUT: °· Any allergies you have. °· All medicines you are taking, including vitamins, herbs, eye drops, creams, and over-the-counter medicines. °· Previous problems you or members of your family have had with the use of anesthetics. °· Any blood disorders you have. °· Previous surgeries you have had. °· Medical conditions you have. °RISKS AND COMPLICATIONS °Generally, this is a safe procedure. However, as with any procedure, complications can occur. Possible complications include:  °· Spinal headache. This is a severe headache that occurs when there is a leak of spinal fluid. A spinal headache causes discomfort but is not dangerous. If it persists, another procedure may be done to treat the headache. °· Bleeding. This most often occurs in people with bleeding disorders. These are disorders in which the blood does not clot normally.   °· Infection at the insertion site that can spread to the bone or spinal fluid.  °· Formation of a spinal cord tumor (rare). °· Brain herniation or movement of the brain into the spinal cord (rare). °· Inability to move (extremely rare). °BEFORE THE PROCEDURE °· You may have blood tests done. These tests can help tell how well your kidneys and liver are working. They can also show how well your blood clots.   °· If you take blood thinners (anticoagulant medicine), ask your health care provider if  and when you should stop taking them.   °· Your health care provider may order a CT scan of your brain. °· Make arrangements for someone to drive you home after the procedure.     °PROCEDURE °· You will be positioned so that the spaces between the bones of the spine (vertebrae) are as wide as possible. This will make it easier to pass the needle into the spinal canal.  °· Depending on your age and size, you may lie on your side, curled up with your knees under your chin. Or, you may sit with your head resting on a pillow that is placed at waist level. °· The skin covering the lower back (or lumbar region) will be cleaned.   °· The skin may be numbed with medicine. °· You may be given pain medicine or a medicine to help you relax (sedative).   °· A small needle will be inserted in the skin until it enters the space that contains the spinal fluid. The needle will not enter the spinal cord.   °· The spinal fluid will be collected into tubes.   °· The needle will be withdrawn, and a bandage will be placed on the site.   °AFTER THE PROCEDURE °· You will remain lying down for 1 hour or for as long as your health care provider suggests.   °· The spinal fluid will be sent to a laboratory to be examined. The results of the examination may be available before you go home. °· A test, called a culture, may be taken of the spinal fluid if your health care provider thinks you have an infection. If cultures   were taken for exam, the results will usually be available in a couple of days. °  °  °This information is not intended to replace advice given to you by your health care provider. Make sure you discuss any questions you have with your health care provider. °  °Document Released: 09/26/2000 Document Revised: 07/20/2013 Document Reviewed: 06/06/2013 °Elsevier Interactive Patient Education ©2016 [employer] ° °

## 2015-11-12 NOTE — OR Nursing (Signed)
Pt took home migraine medication for migraine, Baclofen.

## 2015-11-15 LAB — ANGIOTENSIN CONVERTING ENZYME, CSF: Angio Convert Enzyme: 0.9 U/L (ref 0.0–2.5)

## 2015-11-15 LAB — MYELIN BASIC PROTEIN, CSF: Myelin Basic Protein: 1 ng/mL (ref 0.0–1.2)

## 2015-11-15 LAB — OLIGOCLONAL BANDS, CSF + SERM

## 2015-11-21 ENCOUNTER — Ambulatory Visit: Payer: BC Managed Care – PPO | Admitting: Occupational Therapy

## 2015-11-21 ENCOUNTER — Encounter: Payer: Self-pay | Admitting: Neurology

## 2015-11-27 ENCOUNTER — Ambulatory Visit: Payer: BC Managed Care – PPO | Admitting: Occupational Therapy

## 2015-11-29 ENCOUNTER — Ambulatory Visit: Payer: BC Managed Care – PPO | Admitting: Occupational Therapy

## 2015-12-04 ENCOUNTER — Ambulatory Visit: Payer: BC Managed Care – PPO | Attending: Neurology | Admitting: Occupational Therapy

## 2015-12-04 ENCOUNTER — Encounter: Payer: Self-pay | Admitting: Occupational Therapy

## 2015-12-04 DIAGNOSIS — R531 Weakness: Secondary | ICD-10-CM | POA: Diagnosis present

## 2015-12-04 DIAGNOSIS — R4189 Other symptoms and signs involving cognitive functions and awareness: Secondary | ICD-10-CM | POA: Diagnosis present

## 2015-12-04 DIAGNOSIS — R270 Ataxia, unspecified: Secondary | ICD-10-CM | POA: Diagnosis present

## 2015-12-04 DIAGNOSIS — R278 Other lack of coordination: Secondary | ICD-10-CM

## 2015-12-04 DIAGNOSIS — R6889 Other general symptoms and signs: Secondary | ICD-10-CM

## 2015-12-04 NOTE — Therapy (Signed)
[place] MAIN [facility] SERVICES [address] [place], [place], [address] Phone: [phone]   Fax:  [phone]  Occupational Therapy Evaluation  Patient Details  Name: Linda Vargas MRN: [id number] Date of Birth: Feb 25, 1976 Referring Provider: Dr. Manuella Ghazi  Encounter Date: 12/04/2015      OT End of Session - 12/04/15 1448    Visit Number 1   Number of Visits 24   Date for OT Re-Evaluation 02/26/16   OT Start Time 1300   OT Stop Time 1400   OT Time Calculation (min) 60 min   Activity Tolerance Patient tolerated treatment well      Past Medical History  Diagnosis Date  . Hypertension   . Seasonal allergic rhinitis   . Lupus ([place])   . Anxiety   . Migraine   . Connective tissue disorder (HCC)     undifferentiated  . Fibromyalgia     Past Surgical History  Procedure Laterality Date  . Dilation and curettage of uterus  2011  . Foot surgery    . Cholecystectomy  2012  . Tonsillectomy    . Dilation and curettage of uterus  01/17/2012    Procedure: DILATATION AND CURETTAGE;  Surgeon: Marylynn Pearson, MD;  Location: [facility];  Service: Gynecology;  Laterality: N/A;  Dilatation and curratage, insertion of Bakri Balloon    There were no vitals filed for this visit.  Visit Diagnosis:  Weakness generalized  Loss of coordination  Total self-care deficit      Subjective Assessment - 12/04/15 1422    Subjective  Pt. reports she would like to improve her right hand to assist with caring for her daughter.   Pertinent History Pt. presented to the hospital on 10/12/15 with right sided weakness, and visual changes resulting from Migraine headaches. This pt. is a 40 y.o. female with a history of Lupus, Fibromyalgia, and Anxiety. This patient was hospitalized from 13/30/17 to 10/12/16. Pt. 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 Pt. has strong immediate and  extended familiy support, and comes for outpatient OT services to work on improving her RUE strength, coordination, proprioceptive, kinesthetic awareness and hand function to be able to improve performance with ADL/IADL tasks.    Patient Stated Goals To be able to hold and dress her daughter, To be able to  hold her arm in a natrual position.           [facility] OT Assessment - 12/04/15 0001    Assessment   Diagnosis Right Sided Weakness secondary to Migraine Headache   Referring Provider Dr. Manuella Ghazi   Onset Date 10/12/15   Assessment Pt. presents with right sided weakness, impaired right sided sensory, proprioceptive, and kinesthetic awareness, and impaired vision which is hindering her ability to successfully and efficiently complete ADL and IADL tasks.    Home  Environment   Family/patient expects to be discharged to: Private residence   Available Help at Discharge Family   Type of [place] One level   Bathroom Shower/Tub Tub/Shower unit   [place] None   Additional Comments Pt. currently resides in a second floor apartment, however will be moving into a a first floor apartment once her lease is up.   Prior Function   Level of Independence Independent   Vocation Full time employment   ADL   ADL comments Pt. scores a MAM-20  sum score of 57, and a measure score of 55.5. Pt. has difficulty with tying shoes, buttoning, zipping, wringing out a towel, brushing teeth, cutting meat, using hands to eat a sandwich, opening a child proof medicine bottle, taking items out of a wallet, picking up a half full pitcher, writing 3-4 lines legibly, dialing phone numbers,  turning a key. Pt. also has difficulty fastening jewelry clasps, picking up her daughter and dressing her, tying adult and child size shoes, opening jars, and organizing work related tasks.   Written Expression   Dominant Hand Right   Vision - History   Additional Comments Pt. has a new onset of  blurriness in the right peripheral field, and floaters. Pt. has folllowed up with her eye physician multiple times since the onset.   Coordination   9 Hole Peg Test Right;Left   Right 9 Hole Peg Test 23   Left 9 Hole Peg Test 15   ROM / Strength   AROM / PROM / Strength AROM;Strength  Pt. RUE shoulder flexion 134 degrees. RUE Ranges WFL.   AROM   Overall AROM Comments RUE shoulder flexion 134, BUE ROM WFL   Strength   Overall Strength Comments RUE strength 4-/5 overall, Left  UE strength 4+/5   Hand Function   Right Hand Grip (lbs) 8   Right Hand Lateral Pinch 6 lbs   Right Hand 3 Point Pinch 5 lbs   Left Hand Grip (lbs) 33   Left Hand Lateral Pinch 12 lbs   Left 3 point pinch 10 lbs   Sensation Exercises   Stereognosis intact   Sensory Retraining Impaired proprioceptive and kinesthetic awareness of RUE                         OT Education - 12/04/15 1558    Education provided Yes               OT Long Term Goals - 12/04/15 1741    OT LONG TERM GOAL #1   Title Pt. will improve right grip strength by 10# to be able to open jars.     Baseline 8#   Time 12   Period Weeks   Status New   OT LONG TERM GOAL #2   Title Pt. will improve [place] by 3 sec. on the 9hole peg test to be able to fasten jewelry.   Baseline 23 sec.   Time 12   Period Weeks   Status New   OT LONG TERM GOAL #3   Title Pt. will be independent with tying shoes (adult & child size shoes)   Baseline unable   Time 12   Period Weeks   Status New   OT LONG TERM GOAL #4   Title Pt. will increase RUE strength by 2 muscle grades for ADL /IADL tasks.   Baseline RUE 4-/5   Time 12   Period Weeks   Status New   OT LONG TERM GOAL #5   Title Pt. will increase the awareness of her RUE and hand by using compensatory strategies 100% of the time    Baseline impaired proprioceptive and kinesthetic awareness of RUE and hand.   Time 12   Period Weeks   Status New   OT LONG TERM GOAL #6   Title  Pt. will demonstrate visual compensatory strategies 100% of the time during ADL/IADLs within her environment.    Baseline Blurriness in right peripheral field    Time 12  Period Weeks   Status New               Plan - 12/04/15 1452    Clinical Impression Statement This patient is a 40 y.o. female who presents to the clinic with right sided weakness from Migraine headaches. Pt. presents with impaired sensory, proprioceptive, and kinesthetic awareness of the RUE, as well as decreased strength, ROM, coordination, and hand function which hinder her ability to complete functional ADL and IADL tasks including: computer related tasks, child care tasks, and work related tasks successfully and efficiently Other ADL pt. has difficulty with include: self-dressing, opening containers/jars, and medicine bottles, opening jars, picking up a full pitcher, or glass of water, dialing telephone numbers, taking items out of wallet tying laces,cutting meat, zipping, buttoning, fastening jewelry, using a key, wringing out a towel, writing legibly 3-4 sentences, computer related tasks.     Pt will benefit from skilled therapeutic intervention in order to improve on the following deficits (Retired) Impaired vision/preception;Decreased endurance;Decreased range of motion;Decreased coordination;Decreased activity tolerance;Decreased strength;Impaired UE functional use;Decreased knowledge of use of DME;Pain;Decreased cognition   Rehab Potential Good   Clinical Impairments Affecting Rehab Potential Positive indicators: family support, motivation, age Negative indicators: multiple comorbidities   OT Frequency 2x / week   OT Duration 12 weeks   OT Treatment/Interventions Self-care/ADL training;Cognitive remediation/compensation;Visual/perceptual remediation/compensation;Moist Heat;Neuromuscular education;Therapeutic exercises;Patient/family education;Passive range of motion;Manual Therapy;DME and/or AE instruction;Balance  training;Therapeutic activities;Functional Mobility Training;Energy conservation   Plan Plan to work on improving UE strength, and coordination.   Consulted and Agree with Plan of Care Patient        Problem List Patient Active Problem List   Diagnosis Date Noted  . TIA (transient ischemic attack) 10/12/2015  . Pregnancy, supervision of, high-risk 06/27/2011  . Lupus ([place]) 06/27/2011  . 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 Vaginal bleeding in pregnancy 06/27/2011  . Vaginal lesion 06/27/2011   Linda Carina, MS, OTR/L  Linda Vargas 12/05/2015, 8:37 AM  [place] [facility] [address] [place], [place], [address] Phone: [phone]   Fax:  [phone]  Name: Linda Vargas MRN: [id number] Date of Birth: [birth year]

## 2015-12-06 ENCOUNTER — Ambulatory Visit: Payer: BC Managed Care – PPO | Admitting: Occupational Therapy

## 2015-12-10 ENCOUNTER — Ambulatory Visit: Payer: BC Managed Care – PPO | Admitting: Occupational Therapy

## 2015-12-12 ENCOUNTER — Ambulatory Visit: Payer: BC Managed Care – PPO | Admitting: Occupational Therapy

## 2015-12-19 ENCOUNTER — Ambulatory Visit
Admission: RE | Admit: 2015-12-19 | Discharge: 2015-12-19 | Disposition: A | Discharge status: Home / Self Care | Payer: BC Managed Care – PPO | Source: Ambulatory Visit | Attending: Physician Assistant | Admitting: Physician Assistant

## 2015-12-19 ENCOUNTER — Other Ambulatory Visit: Payer: Self-pay | Admitting: Physician Assistant

## 2015-12-19 DIAGNOSIS — M21861 Other specified acquired deformities of right lower leg: Secondary | ICD-10-CM | POA: Insufficient documentation

## 2015-12-19 DIAGNOSIS — M7651 Patellar tendinitis, right knee: Secondary | ICD-10-CM | POA: Diagnosis not present

## 2015-12-19 DIAGNOSIS — M25561 Pain in right knee: Secondary | ICD-10-CM | POA: Insufficient documentation

## 2015-12-24 ENCOUNTER — Ambulatory Visit: Payer: BC Managed Care – PPO | Admitting: Occupational Therapy

## 2015-12-26 ENCOUNTER — Ambulatory Visit: Payer: BC Managed Care – PPO | Admitting: Occupational Therapy

## 2015-12-31 ENCOUNTER — Ambulatory Visit: Payer: BC Managed Care – PPO | Admitting: Occupational Therapy

## 2016-01-02 ENCOUNTER — Ambulatory Visit: Payer: BC Managed Care – PPO | Admitting: Occupational Therapy

## 2016-01-07 ENCOUNTER — Ambulatory Visit: Payer: BC Managed Care – PPO | Admitting: Occupational Therapy

## 2016-01-09 ENCOUNTER — Ambulatory Visit: Payer: BC Managed Care – PPO | Admitting: Occupational Therapy

## 2016-01-14 ENCOUNTER — Ambulatory Visit: Payer: BC Managed Care – PPO | Admitting: Occupational Therapy

## 2016-01-16 ENCOUNTER — Ambulatory Visit: Payer: BC Managed Care – PPO | Admitting: Occupational Therapy

## 2016-01-21 ENCOUNTER — Ambulatory Visit: Payer: BC Managed Care – PPO | Admitting: Occupational Therapy

## 2016-01-23 ENCOUNTER — Ambulatory Visit: Payer: BC Managed Care – PPO | Admitting: Occupational Therapy

## 2016-01-28 ENCOUNTER — Ambulatory Visit: Payer: BC Managed Care – PPO | Admitting: Occupational Therapy

## 2016-01-30 ENCOUNTER — Ambulatory Visit: Payer: BC Managed Care – PPO | Admitting: Occupational Therapy

## 2016-02-04 ENCOUNTER — Ambulatory Visit: Payer: BC Managed Care – PPO | Admitting: Occupational Therapy

## 2016-02-06 ENCOUNTER — Ambulatory Visit: Payer: BC Managed Care – PPO | Admitting: Occupational Therapy

## 2016-02-07 ENCOUNTER — Emergency Department
Admission: EM | Admit: 2016-02-07 | Discharge: 2016-02-08 | Disposition: A | Discharge status: Home / Self Care | Payer: BC Managed Care – PPO | Attending: Emergency Medicine | Admitting: Emergency Medicine

## 2016-02-07 DIAGNOSIS — Z79899 Other long term (current) drug therapy: Secondary | ICD-10-CM | POA: Insufficient documentation

## 2016-02-07 DIAGNOSIS — N390 Urinary tract infection, site not specified: Secondary | ICD-10-CM | POA: Diagnosis not present

## 2016-02-07 DIAGNOSIS — R1010 Upper abdominal pain, unspecified: Secondary | ICD-10-CM | POA: Diagnosis present

## 2016-02-07 DIAGNOSIS — I10 Essential (primary) hypertension: Secondary | ICD-10-CM | POA: Insufficient documentation

## 2016-02-07 LAB — COMPREHENSIVE METABOLIC PANEL
ALT: 23 U/L (ref 14–54)
AST: 24 U/L (ref 15–41)
Albumin: 4.2 g/dL (ref 3.5–5.0)
Alkaline Phosphatase: 80 U/L (ref 38–126)
Anion gap: 7 (ref 5–15)
BUN: 14 mg/dL (ref 6–20)
CO2: 24 mmol/L (ref 22–32)
Calcium: 9.2 mg/dL (ref 8.9–10.3)
Chloride: 107 mmol/L (ref 101–111)
Creatinine, Ser: 1.33 mg/dL — ABNORMAL HIGH (ref 0.44–1.00)
GFR calc Af Amer: 58 mL/min — ABNORMAL LOW (ref >60)
GFR calc non Af Amer: 50 mL/min — ABNORMAL LOW (ref >60)
Glucose, Bld: 78 mg/dL (ref 65–99)
Potassium: 4 mmol/L (ref 3.5–5.1)
Sodium: 138 mmol/L (ref 135–145)
Total Bilirubin: 0.6 mg/dL (ref 0.3–1.2)
Total Protein: 7.9 g/dL (ref 6.5–8.1)

## 2016-02-07 LAB — URINALYSIS COMPLETE WITH MICROSCOPIC (ARMC ONLY)
Bilirubin Urine: NEGATIVE
Glucose, UA: NEGATIVE mg/dL
Hgb urine dipstick: NEGATIVE
Ketones, ur: NEGATIVE mg/dL
Nitrite: NEGATIVE
Protein, ur: 30 mg/dL — AB
Specific Gravity, Urine: 1.026 (ref 1.005–1.030)
pH: 5 (ref 5.0–8.0)

## 2016-02-07 LAB — CBC
HCT: 39.5 % (ref 35.0–47.0)
Hemoglobin: 13.3 g/dL (ref 12.0–16.0)
MCH: 29 pg (ref 26.0–34.0)
MCHC: 33.7 g/dL (ref 32.0–36.0)
MCV: 85.9 fL (ref 80.0–100.0)
Platelets: 324 10*3/uL (ref 150–440)
RBC: 4.59 MIL/uL (ref 3.80–5.20)
RDW: 12 % (ref 11.5–14.5)
WBC: 4.8 10*3/uL (ref 3.6–11.0)

## 2016-02-07 LAB — POCT PREGNANCY, URINE: Preg Test, Ur: NEGATIVE

## 2016-02-07 LAB — LIPASE, BLOOD: Lipase: 39 U/L (ref 11–51)

## 2016-02-07 MED ORDER — ONDANSETRON HCL 4 MG/2ML IJ SOLN
4.0000 mg | Freq: Once | INTRAMUSCULAR | Status: AC
  Administered 2016-02-07: 4 mg via INTRAVENOUS
  Filled 2016-02-07: qty 2

## 2016-02-07 MED ORDER — CEFTRIAXONE SODIUM 1 G IJ SOLR
1.0000 g | INTRAMUSCULAR | Status: DC
  Administered 2016-02-07: 1 g via INTRAVENOUS
  Filled 2016-02-07: qty 10

## 2016-02-07 MED ORDER — SODIUM CHLORIDE 0.9 % IV BOLUS (SEPSIS)
1000.0000 mL | Freq: Once | INTRAVENOUS | Status: AC
  Administered 2016-02-07: 1000 mL via INTRAVENOUS

## 2016-02-07 MED ORDER — MORPHINE SULFATE (PF) 4 MG/ML IV SOLN
4.0000 mg | Freq: Once | INTRAVENOUS | Status: AC
  Administered 2016-02-07: 4 mg via INTRAVENOUS
  Filled 2016-02-07: qty 1

## 2016-02-07 MED ORDER — DIATRIZOATE MEGLUMINE & SODIUM 66-10 % PO SOLN
15.0000 mL | Freq: Once | ORAL | Status: AC
  Administered 2016-02-07: 15 mL via ORAL
  Filled 2016-02-07: qty 30

## 2016-02-07 NOTE — ED Provider Notes (Signed)
[facility] Emergency Department Provider Note  ____________________________________________  Time seen: Approximately 11:14 PM  I have reviewed the triage vital signs and the nursing notes.   HISTORY  Chief Complaint Abdominal Pain    HPI Linda Vargas is a 40 y.o. female who presents to the ED from home with a chief complaint of abdominal pain. Patient has a history of lupus who reports she recently had a flareup; her rheumatologist started her on CellCept 2 days ago. Reports upper abdominal pain this afternoon. Describes constant, aching pain which is exacerbated by movement. Symptoms are associated with nausea only. Denies associated fever, chills, chest pain, shortness of breath, vomiting, diarrhea. Does report urinary frequency. Denies recent travel or trauma.   Past Medical History  Diagnosis Date  . Hypertension   . Seasonal allergic rhinitis   . Lupus ([place])   . Anxiety   . Migraine   . Connective tissue disorder (HCC)     undifferentiated  . Fibromyalgia     Patient Active Problem List   Diagnosis Date Noted  . TIA (transient ischemic attack) 10/12/2015  . Pregnancy, supervision of, high-risk 06/27/2011  . Lupus ([place]) 06/27/2011  . 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 Vaginal bleeding in pregnancy 06/27/2011  . Vaginal lesion 06/27/2011    Past Surgical History  Procedure Laterality Date  . Dilation and curettage of uterus  2011  . Foot surgery    . Cholecystectomy  2012  . Tonsillectomy    . Dilation and curettage of uterus  01/17/2012    Procedure: DILATATION AND CURETTAGE;  Surgeon: Marylynn Pearson, MD;  Location: [facility];  Service: Gynecology;  Laterality: N/A;  Dilatation and curratage, insertion of Bakri Balloon    Current Outpatient Rx  Name  Route  Sig  Dispense  Refill  . acetaminophen (TYLENOL) 500 MG tablet   Oral   Take 1,000 mg by mouth every 6 (six) hours as needed.         Lianne Moris [facility]  60-120 MG 12 hr tablet   Oral   Take 1 tablet by mouth daily.      5     Dispense as written.   . baclofen (LIORESAL) 10 MG tablet   Oral   Take 10 mg by mouth 2 (two) times daily.         . butalbital-acetaminophen-caffeine (FIORICET, ESGIC) 50-325-40 MG tablet   Oral   Take 1 tablet by mouth once. Patient not taking: Reported on 12/04/2015   14 tablet   0   . EXPIRED: ferrous sulfate 325 (65 FE) MG tablet   Oral   Take 1 tablet (325 mg total) by mouth 3 (three) times daily with meals.   90 tablet   1   . hydroxychloroquine (PLAQUENIL) 200 MG tablet   Oral   Take 200 mg by mouth 2 (two) times daily.           Marland Kitchen venlafaxine XR (EFFEXOR-XR) 75 MG 24 hr capsule   Oral   Take 1 capsule by mouth daily.      2   . verapamil (VERELAN PM) 180 MG 24 hr capsule   Oral   Take 1 capsule by mouth daily. Reported on 12/04/2015      5     Allergies Bean pod extract; Ibuprofen; and Peanut-containing drug products  Family History  Problem Relation Age of Onset  . Hypertension Mother   . Hypertension Father   . Diabetes Father   .  Heart disease Maternal Grandmother   . Heart disease Paternal Grandmother   . Anesthesia problems Neg Hx     Social History Social History  Substance Use Topics  . Smoking status: Never Smoker   . Smokeless tobacco: Never Used  . Alcohol Use: No    Review of Systems  Constitutional: No fever/chills. Eyes: No visual changes. ENT: No sore throat. Cardiovascular: Denies chest pain. Respiratory: Denies shortness of breath. Gastrointestinal: Positive for abdominal pain.  Positive for nausea, no vomiting.  No diarrhea.  No constipation. Genitourinary: Negative for dysuria. Musculoskeletal: Negative for back pain. Skin: Negative for rash. Neurological: Negative for headaches, focal weakness or numbness.  10-point ROS otherwise negative.  ____________________________________________   PHYSICAL EXAM:  VITAL SIGNS: ED Triage  Vitals  Enc Vitals Group     BP 02/07/16 2236 153/96 mmHg     Pulse Rate 02/07/16 2236 131     Resp --      Temp --      Temp src --      SpO2 02/07/16 2236 100 %     Weight --      Height --      Head Cir --      Peak Flow --      Pain Score 02/07/16 2052 8     Pain Loc --      Pain Edu? --      Excl. in [place]? --     Constitutional: Alert and oriented. Well appearing and in no acute distress. Eyes: Conjunctivae are normal. PERRL. EOMI. Head: Atraumatic. Nose: No congestion/rhinnorhea. Mouth/Throat: Mucous membranes are moist.  Oropharynx non-erythematous. Neck: No stridor.   Cardiovascular: Normal rate, regular rhythm. Grossly normal heart sounds.  Good peripheral circulation. Respiratory: Normal respiratory effort.  No retractions. Lungs CTAB. Gastrointestinal: Soft and moderately tender to palpation epigastrium and right upper quadrant without rebound or guarding. Mild distention. No abdominal bruits. No CVA tenderness. Musculoskeletal: No lower extremity tenderness nor edema.  No joint effusions. Neurologic:  Normal speech and language. No gross focal neurologic deficits are appreciated. No gait instability. Skin:  Skin is warm, dry and intact. No rash noted. Psychiatric: Mood and affect are normal. Speech and behavior are normal.  ____________________________________________   LABS (all labs ordered are listed, but only abnormal results are displayed)  Labs Reviewed  COMPREHENSIVE METABOLIC PANEL - Abnormal; Notable for the following:    Creatinine, Ser 1.33 (*)    GFR calc non Af Amer 50 (*)    GFR calc Af Amer 58 (*)    All other components within normal limits  URINALYSIS COMPLETEWITH MICROSCOPIC ([facility] ONLY) - Abnormal; Notable for the following:    Color, Urine YELLOW (*)    APPearance HAZY (*)    Protein, ur 30 (*)    Leukocytes, UA 2+ (*)    Bacteria, UA RARE (*)    Squamous Epithelial / LPF 0-5 (*)    All other components within normal limits  CBC   LIPASE, BLOOD  POC URINE PREG, ED  POCT PREGNANCY, URINE   ____________________________________________  EKG  None ____________________________________________  RADIOLOGY  CT abdomen and pelvis with contrast interpreted per Dr. Dorann Lodge: No acute intra-abdominal or pelvic process.  Stable prominence of the pancreatic duct suggesting prior pancreatitis without acute component. ____________________________________________   PROCEDURES  Procedure(s) performed: None  Critical Care performed: No  ____________________________________________   INITIAL IMPRESSION / ASSESSMENT AND PLAN / ED COURSE  Pertinent labs & imaging results that were available during  my care of the patient were reviewed by me and considered in my medical decision making (see chart for details).  40 year old female with a history of fibromyalgia and lupus who presents with upper abdominal pain. Laboratory urinalysis results remarkable for UTI. Will initiate IV antibiotics, IV analgesia and obtain CT abdomen/pelvis to evaluate for etiology of patient's abdominal pain.  ----------------------------------------- 2:20 AM on 02/08/2016 -----------------------------------------  Updated patient and family members of negative CT results. Patient is feeling much better, smiling and laughing. Strict return precautions given. All verbalize understanding and agree with plan of care. ____________________________________________   FINAL CLINICAL IMPRESSION(S) / ED DIAGNOSES  Final diagnoses:  Pain of upper abdomen      Paulette Blanch, MD 02/08/16 [phone]

## 2016-02-07 NOTE — ED Notes (Signed)
Pt in with co upper abd pain radiates to back, also co migraines and dizziness. Pt denies vomiting or diarrhea does co nausea.

## 2016-02-08 ENCOUNTER — Encounter: Payer: Self-pay | Admitting: Radiology

## 2016-02-08 ENCOUNTER — Emergency Department: Payer: BC Managed Care – PPO

## 2016-02-08 MED ORDER — IOPAMIDOL (ISOVUE-300) INJECTION 61%
100.0000 mL | Freq: Once | INTRAVENOUS | Status: AC | PRN
  Administered 2016-02-08: 100 mL via INTRAVENOUS

## 2016-02-08 MED ORDER — DICYCLOMINE HCL 20 MG PO TABS: 20.0000 mg | ORAL_TABLET | Freq: Four times a day (QID) | ORAL | Status: DC | PRN

## 2016-02-08 MED ORDER — OXYCODONE-ACETAMINOPHEN 5-325 MG PO TABS: 1.0000 | ORAL_TABLET | ORAL | Status: DC | PRN

## 2016-02-08 MED ORDER — OXYCODONE-ACETAMINOPHEN 5-325 MG PO TABS
1.0000 | ORAL_TABLET | Freq: Once | ORAL | Status: AC
  Administered 2016-02-08: 1 via ORAL
  Filled 2016-02-08: qty 1

## 2016-02-08 MED ORDER — PANTOPRAZOLE SODIUM 40 MG PO TBEC: 40.0000 mg | DELAYED_RELEASE_TABLET | Freq: Every day | ORAL | Status: DC

## 2016-02-08 MED ORDER — SULFAMETHOXAZOLE-TRIMETHOPRIM 800-160 MG PO TABS: 2.0000 | ORAL_TABLET | Freq: Two times a day (BID) | ORAL | Status: DC

## 2016-02-08 NOTE — Discharge Instructions (Signed)
1. Take antibiotic as prescribed (Septra DS twice daily 7 days). 2. Start Protonix 40 mg daily (#30). 3. You may take Bentyl (#20) as needed for abdominal discomfort. 4. Return to the ER for worsening symptoms, persistent vomiting, difficulty breathing or other concerns.  Abdominal Pain, Adult Many things can cause abdominal pain. Usually, abdominal pain is not caused by a disease and will improve without treatment. It can often be observed and treated at home. Your health care provider will do a physical exam and possibly order blood tests and X-rays to help determine the seriousness of your pain. However, in many cases, more time must pass before a clear cause of the pain can be found. Before that point, your health care provider may not know if you need more testing or further treatment. HOME CARE INSTRUCTIONS Monitor your abdominal pain for any changes. The following actions may help to alleviate any discomfort you are experiencing:  Only take over-the-counter or prescription medicines as directed by your health care provider.  Do not take laxatives unless directed to do so by your health care provider.  Try a clear liquid diet (broth, tea, or water) as directed by your health care provider. Slowly move to a bland diet as tolerated. SEEK MEDICAL CARE IF:  You have unexplained abdominal pain.  You have abdominal pain associated with nausea or diarrhea.  You have pain when you urinate or have a bowel movement.  You experience abdominal pain that wakes you in the night.  You have abdominal pain that is worsened or improved by eating food.  You have abdominal pain that is worsened with eating fatty foods.  You have a fever. SEEK IMMEDIATE MEDICAL CARE IF:  Your pain does not go away within 2 hours.  You keep throwing up (vomiting).  Your pain is felt only in portions of the abdomen, such as the right side or the left lower portion of the abdomen.  You pass bloody or black tarry  stools. MAKE SURE YOU:  Understand these instructions.  Will watch your condition.  Will get help right away if you are not doing well or get worse.   This information is not intended to replace advice given to you by your health care provider. Make sure you discuss any questions you have with your health care provider.   Document Released: 07/09/2005 Document Revised: 06/20/2015 Document Reviewed: 06/08/2013 Elsevier Interactive Patient Education 2016 Elsevier Inc[employer]  Urinary Tract Infection Urinary tract infections (UTIs) can develop anywhere along your urinary tract. Your urinary tract is your body's drainage system for removing wastes and extra water. Your urinary tract includes two kidneys, two ureters, a bladder, and a urethra. Your kidneys are a pair of bean-shaped organs. Each kidney is about the size of your fist. They are located below your ribs, one on each side of your spine. CAUSES Infections are caused by microbes, which are microscopic organisms, including fungi, viruses, and bacteria. These organisms are so small that they can only be seen through a microscope. Bacteria are the microbes that most commonly cause UTIs. SYMPTOMS  Symptoms of UTIs may vary by age and gender of the patient and by the location of the infection. Symptoms in young women typically include a frequent and intense urge to urinate and a painful, burning feeling in the bladder or urethra during urination. Older women and men are more likely to be tired, shaky, and weak and have muscle aches and abdominal pain. A fever may mean the infection is in  your kidneys. Other symptoms of a kidney infection include pain in your back or sides below the ribs, nausea, and vomiting. DIAGNOSIS To diagnose a UTI, your caregiver will ask you about your symptoms. Your caregiver will also ask you to provide a urine sample. The urine sample will be tested for bacteria and white blood cells. White blood cells are made by your body  to help fight infection. TREATMENT  Typically, UTIs can be treated with medication. Because most UTIs are caused by a bacterial infection, they usually can be treated with the use of antibiotics. The choice of antibiotic and length of treatment depend on your symptoms and the type of bacteria causing your infection. HOME CARE INSTRUCTIONS  If you were prescribed antibiotics, take them exactly as your caregiver instructs you. Finish the medication even if you feel better after you have only taken some of the medication.  Drink enough water and fluids to keep your urine clear or pale yellow.  Avoid caffeine, tea, and carbonated beverages. They tend to irritate your bladder.  Empty your bladder often. Avoid holding urine for long periods of time.  Empty your bladder before and after sexual intercourse.  After a bowel movement, women should cleanse from front to back. Use each tissue only once. SEEK MEDICAL CARE IF:   You have back pain.  You develop a fever.  Your symptoms do not begin to resolve within 3 days. SEEK IMMEDIATE MEDICAL CARE IF:   You have severe back pain or lower abdominal pain.  You develop chills.  You have nausea or vomiting.  You have continued burning or discomfort with urination. MAKE SURE YOU:   Understand these instructions.  Will watch your condition.  Will get help right away if you are not doing well or get worse.   This information is not intended to replace advice given to you by your health care provider. Make sure you discuss any questions you have with your health care provider.   Document Released: 07/09/2005 Document Revised: 06/20/2015 Document Reviewed: 11/07/2011 Elsevier Interactive Patient Education [employer].

## 2016-03-07 ENCOUNTER — Encounter: Payer: Self-pay | Admitting: Emergency Medicine

## 2016-03-07 DIAGNOSIS — Z79899 Other long term (current) drug therapy: Secondary | ICD-10-CM | POA: Insufficient documentation

## 2016-03-07 DIAGNOSIS — Z91010 Allergy to peanuts: Secondary | ICD-10-CM | POA: Diagnosis not present

## 2016-03-07 DIAGNOSIS — M545 Low back pain: Secondary | ICD-10-CM | POA: Insufficient documentation

## 2016-03-07 DIAGNOSIS — R51 Headache: Secondary | ICD-10-CM | POA: Diagnosis present

## 2016-03-07 DIAGNOSIS — I10 Essential (primary) hypertension: Secondary | ICD-10-CM | POA: Diagnosis not present

## 2016-03-07 DIAGNOSIS — Z8673 Personal history of transient ischemic attack (TIA), and cerebral infarction without residual deficits: Secondary | ICD-10-CM | POA: Diagnosis not present

## 2016-03-07 DIAGNOSIS — R238 Other skin changes: Secondary | ICD-10-CM | POA: Diagnosis not present

## 2016-03-07 LAB — COMPREHENSIVE METABOLIC PANEL
ALT: 23 U/L (ref 14–54)
AST: 24 U/L (ref 15–41)
Albumin: 3.9 g/dL (ref 3.5–5.0)
Alkaline Phosphatase: 71 U/L (ref 38–126)
Anion gap: 5 (ref 5–15)
BUN: 13 mg/dL (ref 6–20)
CO2: 26 mmol/L (ref 22–32)
Calcium: 8.8 mg/dL — ABNORMAL LOW (ref 8.9–10.3)
Chloride: 105 mmol/L (ref 101–111)
Creatinine, Ser: 1.04 mg/dL — ABNORMAL HIGH (ref 0.44–1.00)
GFR calc Af Amer: >60 mL/min (ref >60)
GFR calc non Af Amer: >60 mL/min (ref >60)
Glucose, Bld: 87 mg/dL (ref 65–99)
Potassium: 3.5 mmol/L (ref 3.5–5.1)
Sodium: 136 mmol/L (ref 135–145)
Total Bilirubin: 0.5 mg/dL (ref 0.3–1.2)
Total Protein: 7.5 g/dL (ref 6.5–8.1)

## 2016-03-07 LAB — CBC WITH DIFFERENTIAL/PLATELET
Basophils Absolute: 0 10*3/uL (ref 0–0.1)
Basophils Relative: 1 %
Eosinophils Absolute: 0.2 10*3/uL (ref 0–0.7)
Eosinophils Relative: 5 %
HCT: 37 % (ref 35.0–47.0)
Hemoglobin: 12.3 g/dL (ref 12.0–16.0)
Lymphocytes Relative: 37 %
Lymphs Abs: 1.6 10*3/uL (ref 1.0–3.6)
MCH: 28.7 pg (ref 26.0–34.0)
MCHC: 33.4 g/dL (ref 32.0–36.0)
MCV: 86 fL (ref 80.0–100.0)
Monocytes Absolute: 0.4 10*3/uL (ref 0.2–0.9)
Monocytes Relative: 9 %
Neutro Abs: 2 10*3/uL (ref 1.4–6.5)
Neutrophils Relative %: 48 %
Platelets: 291 10*3/uL (ref 150–440)
RBC: 4.3 MIL/uL (ref 3.80–5.20)
RDW: 12.2 % (ref 11.5–14.5)
WBC: 4.3 10*3/uL (ref 3.6–11.0)

## 2016-03-07 NOTE — ED Notes (Signed)
Pt presents to ED with severe upper pack pain that started this afternoon. Upon arrival to ED pt developed a severe headache and right arm tingling. nasuea for several weeks and is currently having a "lupus flare". Pt states she typically can tolerate a lot of pain and reports this pain is "causing tears". Pt tearful during triage.  tx for UTI the end of April. Denies injury. Pt states she has been out of work since the end of April due to her lupus.

## 2016-03-08 ENCOUNTER — Emergency Department
Admission: EM | Admit: 2016-03-08 | Discharge: 2016-03-08 | Disposition: A | Discharge status: Home / Self Care | Payer: BC Managed Care – PPO | Attending: Emergency Medicine | Admitting: Emergency Medicine

## 2016-03-08 DIAGNOSIS — R51 Headache: Secondary | ICD-10-CM

## 2016-03-08 DIAGNOSIS — R519 Headache, unspecified: Secondary | ICD-10-CM

## 2016-03-08 DIAGNOSIS — M545 Low back pain, unspecified: Secondary | ICD-10-CM

## 2016-03-08 DIAGNOSIS — R238 Other skin changes: Secondary | ICD-10-CM

## 2016-03-08 LAB — POCT PREGNANCY, URINE: Preg Test, Ur: NEGATIVE

## 2016-03-08 LAB — URINALYSIS COMPLETE WITH MICROSCOPIC (ARMC ONLY)
Bilirubin Urine: NEGATIVE
Glucose, UA: NEGATIVE mg/dL
Ketones, ur: NEGATIVE mg/dL
Leukocytes, UA: NEGATIVE
Nitrite: NEGATIVE
Protein, ur: NEGATIVE mg/dL
Specific Gravity, Urine: 1.027 (ref 1.005–1.030)
pH: 5 (ref 5.0–8.0)

## 2016-03-08 LAB — PREGNANCY, URINE: Preg Test, Ur: NEGATIVE

## 2016-03-08 MED ORDER — METOCLOPRAMIDE HCL 5 MG/ML IJ SOLN
10.0000 mg | Freq: Once | INTRAMUSCULAR | Status: AC
  Administered 2016-03-08: 10 mg via INTRAVENOUS
  Filled 2016-03-08: qty 2

## 2016-03-08 MED ORDER — MORPHINE SULFATE (PF) 4 MG/ML IV SOLN
4.0000 mg | Freq: Once | INTRAVENOUS | Status: AC
  Administered 2016-03-08: 4 mg via INTRAVENOUS
  Filled 2016-03-08: qty 1

## 2016-03-08 MED ORDER — SODIUM CHLORIDE 0.9 % IV BOLUS (SEPSIS)
1000.0000 mL | Freq: Once | INTRAVENOUS | Status: AC
  Administered 2016-03-08: 1000 mL via INTRAVENOUS

## 2016-03-08 MED ORDER — METOCLOPRAMIDE HCL 10 MG PO TABS: 10.0000 mg | ORAL_TABLET | Freq: Three times a day (TID) | ORAL | Status: DC | PRN

## 2016-03-08 NOTE — Discharge Instructions (Signed)
Please make sure to follow up very closely with your rheumatologist at [facility].  As we discussed today, it is superior important that she come back to the emergency room if you are to developing fever (>99.26F), worsening pain, a worsening rash, redness swelling or new weakness, severe headache, neck stiffness or other new concerns arise.  Please do not drive this morning.  Back Pain, Adult Back pain is very common in adults.The cause of back pain is rarely dangerous and the pain often gets better over time.The cause of your back pain may not be known. Some common causes of back pain include:  Strain of the muscles or ligaments supporting the spine.  Wear and tear (degeneration) of the spinal disks.  Arthritis.  Direct injury to the back. For many people, back pain may return. Since back pain is rarely dangerous, most people can learn to manage this condition on their own. HOME CARE INSTRUCTIONS Watch your back pain for any changes. The following actions may help to lessen any discomfort you are feeling:  Remain active. It is stressful on your back to sit or stand in one place for long periods of time. Do not sit, drive, or stand in one place for more than 30 minutes at a time. Take short walks on even surfaces as soon as you are able.Try to increase the length of time you walk each day.  Exercise regularly as directed by your health care provider. Exercise helps your back heal faster. It also helps avoid future injury by keeping your muscles strong and flexible.  Do not stay in bed.Resting more than 1-2 days can delay your recovery.  Pay attention to your body when you bend and lift. The most comfortable positions are those that put less stress on your recovering back. Always use proper lifting techniques, including:  Bending your knees.  Keeping the load close to your body.  Avoiding twisting.  Find a comfortable position to sleep. Use a firm mattress and lie on your  side with your knees slightly bent. If you lie on your back, put a pillow under your knees.  Avoid feeling anxious or stressed.Stress increases muscle tension and can worsen back pain.It is important to recognize when you are anxious or stressed and learn ways to manage it, such as with exercise.  Take medicines only as directed by your health care provider. Over-the-counter medicines to reduce pain and inflammation are often the most helpful.Your health care provider may prescribe muscle relaxant drugs.These medicines help dull your pain so you can more quickly return to your normal activities and healthy exercise.  Apply ice to the injured area:  Put ice in a plastic bag.  Place a towel between your skin and the bag.  Leave the ice on for 20 minutes, 2-3 times a day for the first 2-3 days. After that, ice and heat may be alternated to reduce pain and spasms.  Maintain a healthy weight. Excess weight puts extra stress on your back and makes it difficult to maintain good posture. SEEK MEDICAL CARE IF:  You have pain that is not relieved with rest or medicine.  You have increasing pain going down into the legs or buttocks.  You have pain that does not improve in one week.  You have night pain.  You lose weight.  You have a fever or chills. SEEK IMMEDIATE MEDICAL CARE IF:   You develop new bowel or bladder control problems.  You have unusual weakness or numbness in your arms  or legs.  You develop nausea or vomiting.  You develop abdominal pain.  You feel faint.   This information is not intended to replace advice given to you by your health care provider. Make sure you discuss any questions you have with your health care provider.   Document Released: 09/29/2005 Document Revised: 10/20/2014 Document Reviewed: 01/31/2014 Elsevier Interactive Patient Education [employer].

## 2016-03-08 NOTE — ED Notes (Signed)
Pt also reports headache. Pt reports she often has headache with associated numbness in right arm and has been evaluated for symptoms with [facility], and Dr Manuella Ghazi at [facility] (neurology)

## 2016-03-08 NOTE — ED Notes (Signed)
MD Quale at bedside. 

## 2016-03-08 NOTE — ED Notes (Signed)
Pt reports hx of Lupus. Pt started CellCep in April, with dosage gradually increasing. Pt reports nausea/anorexia she believes due medication. Pt c/o back pain starting between shoulder blades, radiating to right lower back. Pt also reports painful rash on buttocks. Pt has small raised white spots medial/upper buttocks.  Pt c/o of cramping right foot. Pt c/o of numbness in right arm

## 2016-03-08 NOTE — ED Provider Notes (Signed)
[facility] Emergency Department Provider Note  ____________________________________________  Time seen: Approximately 3 AM  I have reviewed the triage vital signs and the nursing notes.   HISTORY  Chief Complaint Back Pain and Numbness    HPI Linda Vargas is a 40 y.o. female with a history of lupus, migraines. Currently on CellCept and Plaquenil.  Patient reports that she is having a headache, she is had a throbbing left-sided headache as well as tingling in the right arm that she is been evaluated for many times as well as by neurology and rheumatology in the past. She reports this is not unusual, and her headache is frequent and regular without any neck pain stiffness or fevers.  Patient also reports that for about the last3-4 days she's been having a small rash at the top of her right buttock as well as a burning and stinging feeling from the low to mid back that wraps around towards the right flank.  No fevers nausea or vomiting. No chest pain or shortness of breath. She talked to her rheumatologist and they advised her today to decrease her CellCept dose. Advised her also come to the ER for evaluation.  Chronic tingling in the right arm. Denies any new weakness, trouble walking, bowel or bladder issues.  Past Medical History  Diagnosis Date  . Hypertension   . Seasonal allergic rhinitis   . Lupus ([place])   . Anxiety   . Migraine   . Connective tissue disorder (HCC)     undifferentiated  . Fibromyalgia     Patient Active Problem List   Diagnosis Date Noted  . TIA (transient ischemic attack) 10/12/2015  . Pregnancy, supervision of, high-risk 06/27/2011  . Lupus ([place]) 06/27/2011  . 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 Vaginal bleeding in pregnancy 06/27/2011  . Vaginal lesion 06/27/2011    Past Surgical History  Procedure Laterality Date  . Dilation and curettage of uterus  2011  . Foot surgery    . Cholecystectomy  2012  .  Tonsillectomy    . Dilation and curettage of uterus  01/17/2012    Procedure: DILATATION AND CURETTAGE;  Surgeon: Marylynn Pearson, MD;  Location: [place] [facility];  Service: Gynecology;  Laterality: N/A;  Dilatation and curratage, insertion of Bakri Balloon    Current Outpatient Rx  Name  Route  Sig  Dispense  Refill  . acetaminophen (TYLENOL) 500 MG tablet   Oral   Take 1,000 mg by mouth every 6 (six) hours as needed.         Lianne Moris [facility]ER[facility] CONGESTION 60-120 MG 12 hr tablet   Oral   Take 1 tablet by mouth daily.      5     Dispense as written.   . Azelastine HCl 0.15 % SOLN   Each Nare   Place 2 sprays into both nostrils daily.      9   . cetirizine (ZYRTEC) 10 MG tablet   Oral   Take 1 tablet by mouth daily.      12   . dicyclomine (BENTYL) 20 MG tablet   Oral   Take 1 tablet (20 mg total) by mouth every 6 (six) hours as needed.   20 tablet   0   . EPIPEN 2-PAK 0.3 MG/0.3ML SOAJ injection      INJECT INTRAMUSCULARLY AS DIRECTED      0     Dispense as written.   . ergocalciferol (VITAMIN D2) 50000 units capsule   Oral  Take 50,000 Units by mouth once a week.         . ferrous sulfate 325 (65 FE) MG tablet   Oral   Take 325 mg by mouth daily with breakfast.         . hydroxychloroquine (PLAQUENIL) 200 MG tablet   Oral   Take 200 mg by mouth 2 (two) times daily.           . metoCLOPramide (REGLAN) 10 MG tablet   Oral   Take 1 tablet (10 mg total) by mouth every 8 (eight) hours as needed for nausea or vomiting.   30 tablet   0   . metoprolol succinate (TOPROL-XL) 50 MG 24 hr tablet   Oral   Take 50 mg by mouth daily. Take with or immediately following a meal.         . mycophenolate (CELLCEPT) 500 MG tablet   Oral   Take 500 mg by mouth 2 (two) times daily.         . ondansetron (ZOFRAN-ODT) 8 MG disintegrating tablet   Oral   Take 8 mg by mouth every 8 (eight) hours as needed for nausea or vomiting.         Marland Kitchen oxyCODONE-acetaminophen  (ROXICET) 5-325 MG tablet   Oral   Take 1 tablet by mouth every 4 (four) hours as needed for severe pain.   10 tablet   0   . pantoprazole (PROTONIX) 40 MG tablet   Oral   Take 1 tablet (40 mg total) by mouth daily.   30 tablet   0   . RELPAX 20 MG tablet      TK 1 T PO AOS OF HEADACHE. MAY REPEAT AGAIN 2 H LATER. MAX DOSE IS 3 TS PER 24 HOURS      3     Dispense as written.   . sulfamethoxazole-trimethoprim (BACTRIM DS,SEPTRA DS) 800-160 MG tablet   Oral   Take 2 tablets by mouth 2 (two) times daily.   28 tablet   0   . traMADol (ULTRAM) 50 MG tablet   Oral   Take 50 mg by mouth every 6 (six) hours as needed.         . venlafaxine XR (EFFEXOR-XR) 75 MG 24 hr capsule   Oral   Take 1 capsule by mouth daily.      2     Allergies Bean pod extract; Ibuprofen; and Peanut-containing drug products  Family History  Problem Relation Age of Onset  . Hypertension Mother   . Hypertension Father   . Diabetes Father   . Heart disease Maternal Grandmother   . Heart disease Paternal Grandmother   . Anesthesia problems Neg Hx     Social History Social History  Substance Use Topics  . Smoking status: Never Smoker   . Smokeless tobacco: Never Used  . Alcohol Use: No    Review of Systems Constitutional: No fever/chills Eyes: No visual changes. ENT: No sore throat. Cardiovascular: Denies chest pain. Respiratory: Denies shortness of breath. Gastrointestinal: No abdominal pain.  No nausea, no vomiting.  No diarrhea.  No constipation. Genitourinary: Negative for dysuria. Musculoskeletal:Denies pain in the middle of the back. No injury. No fevers chills. No trouble on the left side. Good use of her arms and legs without difficulty. Pain is relieved by laying on her left side. Skin: Negative for rash except as noted. Neurological: Negative for headaches, focal weakness or numbness.  10-point ROS otherwise  negative.  ____________________________________________  PHYSICAL EXAM:  VITAL SIGNS: ED Triage Vitals  Enc Vitals Group     BP 03/07/16 2044 153/93 mmHg     Pulse Rate 03/07/16 2044 64     Resp 03/07/16 2044 18     Temp 03/07/16 2044 98.7 F (37.1 C)     Temp Source 03/07/16 2044 Oral     SpO2 03/07/16 2044 100 %     Weight 03/07/16 2044 196 lb (88.905 kg)     Height 03/07/16 2044 5\' 10"  (1.778 m)     Head Cir --      Peak Flow --      Pain Score 03/07/16 2045 9     Pain Loc --      Pain Edu? --      Excl. in [place]? --    Constitutional: Alert and oriented. Well appearing and in no acute distress. Eyes: Conjunctivae are normal. PERRL. EOMI. Head: Atraumatic. Nose: No congestion/rhinnorhea. Mouth/Throat: Mucous membranes are moist.  Oropharynx non-erythematous. Neck: No stridor.  No meningismus. Good range of motion of the neck. Cardiovascular: Normal rate, regular rhythm. Grossly normal heart sounds.  Good peripheral circulation. Respiratory: Normal respiratory effort.  No retractions. Lungs CTAB. Gastrointestinal: Soft and nontender. No distention. No abdominal bruits. No CVA tenderness. Musculoskeletal: No lower extremity tenderness nor edema.  No joint effusions. Normal strength in all extremities. No noted sensory deficits, though she does report a slight tingling over the right upper arm which she reports is chronic. There is no pronator drift. The patient reports tenderness along the paraspinous muscles of the right upper to mid lumbar spine. The patient has a very small area less than the size of a quarter of a few small white vesicular lesions without surrounding erythema or evidence of induration or underlying abscess just above the right gluteus. Neurologic:  Normal speech and language. No gross focal neurologic deficits are appreciated. She walks with stable and normal gait. Normal strength in all major hip flexors and extensors as well as dorsi and plantar flexion  bilateral. Normal dorsalis pedis pulse bilateral.  Skin:  Skin is warm, dry and intact. No rash noted. Psychiatric: Mood and affect are normal. Speech and behavior are normal.  ____________________________________________   LABS (all labs ordered are listed, but only abnormal results are displayed)  Labs Reviewed  COMPREHENSIVE METABOLIC PANEL - Abnormal; Notable for the following:    Creatinine, Ser 1.04 (*)    Calcium 8.8 (*)    All other components within normal limits  URINALYSIS COMPLETEWITH MICROSCOPIC ([facility] ONLY) - Abnormal; Notable for the following:    Color, Urine YELLOW (*)    APPearance HAZY (*)    Hgb urine dipstick 1+ (*)    Bacteria, UA RARE (*)    Squamous Epithelial / LPF 6-30 (*)    All other components within normal limits  CBC WITH DIFFERENTIAL/PLATELET  PREGNANCY, URINE  POC URINE PREG, ED  POCT PREGNANCY, URINE   ____________________________________________  EKG   ____________________________________________  RADIOLOGY    ____________________________________________   PROCEDURES  Procedure(s) performed: None  Critical Care performed: No  ____________________________________________   INITIAL IMPRESSION / ASSESSMENT AND PLAN / ED COURSE  Pertinent labs & imaging results that were available during my care of the patient were reviewed by me and considered in my medical decision making (see chart for details).  Patient presents for headache, this appears to be chronic in nature per her report and unchanged from her baseline headaches which she is had thoroughly worked up  and her thought to be possibly migraines, please see [facility] neurology note from early May. We will not repeat imaging as she has had previous MRIs and recent neurologic evaluation for the same, but we'll treat with Reglan. No evidence supporting need for new imaging or evidence of new stroke like symptoms, or confusion or other symptoms suggestive acute lupus flare.  Regarding  her right back pain and is somewhat unclear as to cause, but she is afebrile and has no evidence of acute neurologic deficit or trauma. Her symptoms sound somewhat neuropathic given a burning sensation, and she does have a small vesicular lesion at the right gluteal cleft, but this does not fit the same dermatome as the burning sensation and her symptoms of 3-4 days seem to likely suggest that there would be no significant benefit to antiviral treatment or clear evidence supports shingles at this time. Etiology is not entirely clear, though she has no abdominal discomfort, no evidence of urinary tract infection, she is not complaining and does not demonstrate any midline thoracic or lumbar tenderness. I do not believe she has epidural abscess, she does not have a fever and lacks infectious symptoms.  Because of her complex medical history and close follow-up at [facility] a cold the on-call rheumatologist, and was directed to speak with Dr. Lorin Mercy of internal medicine. We discussed the patient's case, and he agrees and seems to fine that likely the patient could be discharged with close rheumatology follow-up. Dr. Lorin Mercy will be contacting her rheumatologist for close follow-up.  The patient reports her headache and symptoms are much much better after Reglan and morphine. She is awake alert in no distress. I discussed I do not have a clear diagnosis as to cause for her burning sensation and rash, which again does not seem to clearly fit a picture of shingles, but it could be related to her CellCept therapy or lupus. She will follow up very closely with rheumatology and she'll return to the ER right away if new or concerning symptoms, especially any weakness or numbness in the legs, she develops any fever, worsening pain, the rash extends becomes red, or other new concerns arise. Discussed with the patient and she thinks these are very reasonable decisions and feels much better. I am comfortable with this  plan, and the patient appears to be very capable of following close return precautions and following up with her rheumatologist at [facility]. ____________________________________________   FINAL CLINICAL IMPRESSION(S) / ED DIAGNOSES  Final diagnoses:  Right-sided low back pain without sciatica  Vesicles  Left-sided headache      Delman Kitten, MD 03/08/16 [phone]

## 2016-05-05 ENCOUNTER — Other Ambulatory Visit: Payer: Self-pay | Admitting: Neurology

## 2016-05-05 DIAGNOSIS — I677 Cerebral arteritis, not elsewhere classified: Secondary | ICD-10-CM

## 2016-05-16 ENCOUNTER — Ambulatory Visit
Admission: RE | Admit: 2016-05-16 | Discharge: 2016-05-16 | Disposition: A | Discharge status: Home / Self Care | Payer: BC Managed Care – PPO | Source: Ambulatory Visit | Attending: Neurology | Admitting: Neurology

## 2016-05-16 DIAGNOSIS — I677 Cerebral arteritis, not elsewhere classified: Secondary | ICD-10-CM | POA: Diagnosis not present

## 2016-05-16 DIAGNOSIS — R9089 Other abnormal findings on diagnostic imaging of central nervous system: Secondary | ICD-10-CM | POA: Insufficient documentation

## 2016-05-16 MED ORDER — GADOBENATE DIMEGLUMINE 529 MG/ML IV SOLN
20.0000 mL | Freq: Once | INTRAVENOUS | Status: AC | PRN
  Administered 2016-05-16: 18 mL via INTRAVENOUS

## 2016-12-12 DIAGNOSIS — G43119 Migraine with aura, intractable, without status migrainosus: Secondary | ICD-10-CM | POA: Insufficient documentation

## 2017-01-19 DIAGNOSIS — S9031XA Contusion of right foot, initial encounter: Secondary | ICD-10-CM | POA: Insufficient documentation

## 2017-01-21 DIAGNOSIS — F329 Major depressive disorder, single episode, unspecified: Secondary | ICD-10-CM | POA: Insufficient documentation

## 2017-01-21 DIAGNOSIS — F32A Depression, unspecified: Secondary | ICD-10-CM | POA: Insufficient documentation

## 2017-01-21 DIAGNOSIS — F321 Major depressive disorder, single episode, moderate: Secondary | ICD-10-CM | POA: Insufficient documentation

## 2017-09-13 ENCOUNTER — Encounter (HOSPITAL_COMMUNITY): Payer: Self-pay

## 2017-09-13 ENCOUNTER — Inpatient Hospital Stay (HOSPITAL_COMMUNITY): Payer: BC Managed Care – PPO

## 2017-09-13 ENCOUNTER — Inpatient Hospital Stay (HOSPITAL_COMMUNITY)
Admission: AD | Admit: 2017-09-13 | Discharge: 2017-09-13 | Disposition: A | Discharge status: Home / Self Care | Payer: BC Managed Care – PPO | Source: Ambulatory Visit | Attending: Obstetrics and Gynecology | Admitting: Obstetrics and Gynecology

## 2017-09-13 ENCOUNTER — Other Ambulatory Visit: Payer: Self-pay | Admitting: Obstetrics and Gynecology

## 2017-09-13 DIAGNOSIS — O09521 Supervision of elderly multigravida, first trimester: Secondary | ICD-10-CM | POA: Diagnosis not present

## 2017-09-13 DIAGNOSIS — F419 Anxiety disorder, unspecified: Secondary | ICD-10-CM | POA: Diagnosis not present

## 2017-09-13 DIAGNOSIS — O10011 Pre-existing essential hypertension complicating pregnancy, first trimester: Secondary | ICD-10-CM | POA: Insufficient documentation

## 2017-09-13 DIAGNOSIS — Z9049 Acquired absence of other specified parts of digestive tract: Secondary | ICD-10-CM | POA: Insufficient documentation

## 2017-09-13 DIAGNOSIS — Z886 Allergy status to analgesic agent status: Secondary | ICD-10-CM | POA: Insufficient documentation

## 2017-09-13 DIAGNOSIS — O10919 Unspecified pre-existing hypertension complicating pregnancy, unspecified trimester: Secondary | ICD-10-CM

## 2017-09-13 DIAGNOSIS — Z833 Family history of diabetes mellitus: Secondary | ICD-10-CM | POA: Diagnosis not present

## 2017-09-13 DIAGNOSIS — Z79899 Other long term (current) drug therapy: Secondary | ICD-10-CM | POA: Insufficient documentation

## 2017-09-13 DIAGNOSIS — Z91010 Allergy to peanuts: Secondary | ICD-10-CM | POA: Diagnosis not present

## 2017-09-13 DIAGNOSIS — O99341 Other mental disorders complicating pregnancy, first trimester: Secondary | ICD-10-CM | POA: Insufficient documentation

## 2017-09-13 DIAGNOSIS — Z3A01 Less than 8 weeks gestation of pregnancy: Secondary | ICD-10-CM | POA: Insufficient documentation

## 2017-09-13 DIAGNOSIS — O9989 Other specified diseases and conditions complicating pregnancy, childbirth and the puerperium: Secondary | ICD-10-CM | POA: Insufficient documentation

## 2017-09-13 DIAGNOSIS — M797 Fibromyalgia: Secondary | ICD-10-CM | POA: Diagnosis not present

## 2017-09-13 DIAGNOSIS — O209 Hemorrhage in early pregnancy, unspecified: Secondary | ICD-10-CM

## 2017-09-13 DIAGNOSIS — O200 Threatened abortion: Secondary | ICD-10-CM | POA: Diagnosis not present

## 2017-09-13 DIAGNOSIS — Z8249 Family history of ischemic heart disease and other diseases of the circulatory system: Secondary | ICD-10-CM | POA: Diagnosis not present

## 2017-09-13 DIAGNOSIS — N939 Abnormal uterine and vaginal bleeding, unspecified: Secondary | ICD-10-CM | POA: Diagnosis present

## 2017-09-13 LAB — URINALYSIS, ROUTINE W REFLEX MICROSCOPIC
Bacteria, UA: NONE SEEN
Bilirubin Urine: NEGATIVE
Glucose, UA: NEGATIVE mg/dL
Ketones, ur: NEGATIVE mg/dL
Nitrite: NEGATIVE
Protein, ur: NEGATIVE mg/dL
Specific Gravity, Urine: 1.015 (ref 1.005–1.030)
pH: 5 (ref 5.0–8.0)

## 2017-09-13 LAB — POCT PREGNANCY, URINE: Preg Test, Ur: POSITIVE — AB

## 2017-09-13 MED ORDER — HYDROCODONE-ACETAMINOPHEN 5-325 MG PO TABS: 1.0000 | ORAL_TABLET | ORAL | 0 refills | Status: DC | PRN

## 2017-09-13 NOTE — MAU Note (Signed)
Urine in lab 

## 2017-09-13 NOTE — MAU Provider Note (Signed)
History     CSN: [id number]  Arrival date and time: 09/13/17 1319   None     Chief Complaint  Patient presents with  . Vaginal Bleeding  . Abdominal Pain   HPI   Ms.Linda Vargas is a 41 y.o. female [phone] @ [redacted]w[redacted]d here in MAU with vaginal bleeding or abdominal pain. States the bleeding started yesterday after intercourse. It was light pink in color initially. The bleeding stopped and then restarted this morning when she woke up. The blood was bright red this morning when she wiped. States she is having lower abdominal pain that comes and goes. The pain started yesterday. Standing made the pain worse. The pain is cramp like at this time.   Elevated BP today, patient took her morning dose of labetalol here in MAU. 200 mg PO.   OB History    Gravida Para Term Preterm AB Living   4 1 1  0 2 1   SAB TAB Ectopic Multiple Live Births   1 1 0 0 1      Past Medical History:  Diagnosis Date  . Anxiety   . Connective tissue disorder (HCC)    undifferentiated  . Fibromyalgia   . Hypertension   . Lupus   . Migraine   . Seasonal allergic rhinitis     Past Surgical History:  Procedure Laterality Date  . CHOLECYSTECTOMY  2012  . DILATION AND CURETTAGE OF UTERUS  2011  . DILATION AND CURETTAGE OF UTERUS  01/17/2012   Procedure: DILATATION AND CURETTAGE;  Surgeon: Marylynn Pearson, MD;  Location: [facility];  Service: Gynecology;  Laterality: N/A;  Dilatation and curratage, insertion of Bakri Balloon  . FOOT SURGERY    . TONSILLECTOMY      Family History  Problem Relation Age of Onset  . Hypertension Mother   . Hypertension Father   . Diabetes Father   . Heart disease Maternal Grandmother   . Heart disease Paternal Grandmother   . Anesthesia problems Neg Hx     Social History   Tobacco Use  . Smoking status: Never Smoker  . Smokeless tobacco: Never Used  Substance Use Topics  . Alcohol use: No  . Drug use: No    Allergies:  Allergies  Allergen Reactions  . Bean  Pod Extract Hives  . Ibuprofen Swelling    Lips swell  . Peanut-Containing Drug Products Hives    Medications Prior to Admission  Medication Sig Dispense Refill Last Dose  . acetaminophen (TYLENOL) 500 MG tablet Take 1,000 mg by mouth every 6 (six) hours as needed for headache.    Past Week at Unknown time  . cetirizine (ZYRTEC) 10 MG tablet Take 1 tablet by mouth daily.  12 09/12/2017 at Unknown time  . FLUoxetine (PROZAC) 10 MG tablet Take 5 mg by mouth daily.     . hydroxychloroquine (PLAQUENIL) 200 MG tablet Take 200 mg by mouth 2 (two) times daily.     09/12/2017 at Unknown time  . labetalol (NORMODYNE) 200 MG tablet Take 200 mg by mouth 2 (two) times daily.   6 09/12/2017 at 1930  . Prenatal Vit-Fe Fumarate-FA (PRENATAL MULTIVITAMIN) TABS tablet Take 1 tablet by mouth daily at 12 noon.   09/12/2017 at Unknown time  . EPIPEN 2-PAK 0.3 MG/0.3ML SOAJ injection INJECT INTRAMUSCULARLY AS DIRECTED  0   . ferrous sulfate 325 (65 FE) MG tablet Take 325 mg by mouth daily with breakfast.   Not Taking at Unknown time  . metoprolol  succinate (TOPROL-XL) 50 MG 24 hr tablet Take 50 mg by mouth daily. Take with or immediately following a meal.   Not Taking at Unknown time  . mycophenolate (CELLCEPT) 500 MG tablet Take 500 mg by mouth 2 (two) times daily.   Not Taking at Unknown time  . ondansetron (ZOFRAN-ODT) 8 MG disintegrating tablet Take 8 mg by mouth every 8 (eight) hours as needed for nausea or vomiting.   Not Taking at Unknown time  . RELPAX 20 MG tablet TK 1 T PO AOS OF HEADACHE. MAY REPEAT AGAIN 2 H LATER. MAX DOSE IS 3 TS PER 24 HOURS  3 Not Taking at Unknown time  . traMADol (ULTRAM) 50 MG tablet Take 50 mg by mouth every 6 (six) hours as needed.   Not Taking at Unknown time  . venlafaxine XR (EFFEXOR-XR) 75 MG 24 hr capsule Take 1 capsule by mouth daily.  2 Not Taking at Unknown time   Results for orders placed or performed during the hospital encounter of 09/13/17 (from the past 48 hour(s))   Urinalysis, Routine w reflex microscopic     Status: Abnormal   Collection Time: 09/13/17  1:35 PM  Result Value Ref Range   Color, Urine YELLOW YELLOW   APPearance HAZY (A) CLEAR   Specific Gravity, Urine 1.015 1.005 - 1.030   pH 5.0 5.0 - 8.0   Glucose, UA NEGATIVE NEGATIVE mg/dL   Hgb urine dipstick MODERATE (A) NEGATIVE   Bilirubin Urine NEGATIVE NEGATIVE   Ketones, ur NEGATIVE NEGATIVE mg/dL   Protein, ur NEGATIVE NEGATIVE mg/dL   Nitrite NEGATIVE NEGATIVE   Leukocytes, UA SMALL (A) NEGATIVE   RBC / HPF 0-5 0 - 5 RBC/hpf   WBC, UA 0-5 0 - 5 WBC/hpf   Bacteria, UA NONE SEEN NONE SEEN   Squamous Epithelial / LPF 6-30 (A) NONE SEEN   Mucus PRESENT   Pregnancy, urine POC     Status: Abnormal   Collection Time: 09/13/17  1:45 PM  Result Value Ref Range   Preg Test, Ur POSITIVE (A) NEGATIVE    Comment:        THE SENSITIVITY OF THIS METHODOLOGY IS >24 mIU/mL     US Ob Comp Less 14 Wks  Result Date: 09/13/2017 CLINICAL DATA:  Vaginal bleeding, first trimester pregnancy. Gestational age by last menstrual period 7 weeks and 5 days. EXAM: OBSTETRIC <14 WK [place] AND TRANSVAGINAL OB US TECHNIQUE: Both transabdominal and transvaginal ultrasound examinations were performed for complete evaluation of the gestation as well as the maternal uterus, adnexal regions, and pelvic cul-de-sac. Transvaginal technique was performed to assess early pregnancy. COMPARISON:  None. FINDINGS: Intrauterine gestational sac: Present though, lobulated irregular contour. Yolk sac:  Present Embryo:  Not present Cardiac Activity: Not applicable MSD: 20  mm   6 w   6  d Subchorionic hemorrhage:  None visualized. Maternal uterus/adnexae: Normal appearance of the adnexae. No free fluid. IMPRESSION: Irregular probable intrauterine gestational sac and yolk sac without embryo. Findings are suspicious but not yet definitive for failed pregnancy. Recommend follow-up US in 10-14 days for definitive diagnosis. This recommendation  follows SRU consensus guidelines: Diagnostic Criteria for Nonviable Pregnancy Early in the First Trimester. [employer] Med 2013; 161:0960-45. Electronically Signed   By: Elon Alas M.D.   On: 09/13/2017 14:56   US Ob Transvaginal  Result Date: 09/13/2017 CLINICAL DATA:  Vaginal bleeding, first trimester pregnancy. Gestational age by last menstrual period 7 weeks and 5 days. EXAM: OBSTETRIC <14  WK [place] AND TRANSVAGINAL OB US TECHNIQUE: Both transabdominal and transvaginal ultrasound examinations were performed for complete evaluation of the gestation as well as the maternal uterus, adnexal regions, and pelvic cul-de-sac. Transvaginal technique was performed to assess early pregnancy. COMPARISON:  None. FINDINGS: Intrauterine gestational sac: Present though, lobulated irregular contour. Yolk sac:  Present Embryo:  Not present Cardiac Activity: Not applicable MSD: 20  mm   6 w   6  d Subchorionic hemorrhage:  None visualized. Maternal uterus/adnexae: Normal appearance of the adnexae. No free fluid. IMPRESSION: Irregular probable intrauterine gestational sac and yolk sac without embryo. Findings are suspicious but not yet definitive for failed pregnancy. Recommend follow-up US in 10-14 days for definitive diagnosis. This recommendation follows SRU consensus guidelines: Diagnostic Criteria for Nonviable Pregnancy Early in the First Trimester. [employer] Med 2013; 242:3536-14. Electronically Signed   By: Elon Alas M.D.   On: 09/13/2017 14:56   Review of Systems  Constitutional: Negative for fever.  Gastrointestinal: Positive for abdominal pain.  Genitourinary: Positive for vaginal bleeding. Negative for dysuria.  Neurological: Negative for headaches.   Physical Exam   Blood pressure (!) 151/89, pulse 61, temperature 99 F (37.2 C), temperature source Oral, resp. rate 18, height 5\' 9"  (1.753 m), weight 223 lb (101.2 kg), last menstrual period 07/10/2017.  Physical Exam  Constitutional: She is  oriented to person, place, and time. She appears well-developed and well-nourished. No distress.  HENT:  Head: Normocephalic.  Eyes: Pupils are equal, round, and reactive to light.  Respiratory: Effort normal.  GI: Soft. She exhibits no distension. There is no tenderness. There is no rebound.  Genitourinary:  Genitourinary Comments: Cervix: closed, posterior. Small amount of dark red blood noted on exam glove.  Neurological: She is alert and oriented to person, place, and time.  Skin: Skin is warm. She is not diaphoretic.   MAU Course  Procedures  None  MDM  Blood type: O positive  Discussed [place] and labs with Dr. Helane Rima  Patient to follow up this week with their office.  Assessment and Plan   A:  Threatened miscarriage  Vaginal bleeding in pregnancy, first trimester  Chronic hypertension during pregnancy, antepartum   P:  Discharge home with strict return precautions Take your labetalol as directed by your OB Return to MAU if symptoms worsen Follow up with OB this week for BP check and [place] for viability  Pelvic rest   Rasch, Artist Pais, NP 09/13/2017 7:49 PM

## 2017-09-13 NOTE — MAU Note (Signed)
Had light vaginal bleeding yesterday saw some tissue in toilet, today a lot bleeding today, pain on right side, has had ultrasound that confirmed IUP.

## 2017-09-13 NOTE — Discharge Instructions (Signed)
Threatened Miscarriage °A threatened miscarriage is when you have vaginal bleeding during your first 20 weeks of pregnancy but the pregnancy has not ended. Your doctor will do tests to make sure you are still pregnant. The cause of the bleeding may not be known. This condition does not mean your pregnancy will end. It does increase the risk of it ending (complete miscarriage). °Follow these instructions at home: °· Make sure you keep all your doctor visits for prenatal care. °· Get plenty of rest. °· Do not have sex or use tampons if you have vaginal bleeding. °· Do not douche. °· Do not smoke or use drugs. °· Do not drink alcohol. °· Avoid caffeine. °Contact a doctor if: °· You have light bleeding from your vagina. °· You have belly pain or cramping. °· You have a fever. °Get help right away if: °· You have heavy bleeding from your vagina. °· You have clots of blood coming from your vagina. °· You have bad pain or cramps in your low back or belly. °· You have fever, chills, and bad belly pain. °This information is not intended to replace advice given to you by your health care provider. Make sure you discuss any questions you have with your health care provider. °Document Released: 09/11/2008 Document Revised: 03/06/2016 Document Reviewed: 07/26/2013 °Elsevier Interactive Patient Education © 2018 [employer] ° °

## 2017-09-13 NOTE — Progress Notes (Addendum)
G4P1 @ 7.[redacted] wksga. Presents to triage for bleeding that started yesterday that was "really light" but today was bright red and "lot of it".   Hx: CHTN and switched to labetalol.   provider allowed pt to take a.m. Labetalol. Stopped serial bp  Provider at bs assessing.  1418: SVE closed bleeding noted on glove. Ordered for u/s  1420: pt to u/s taken by nurse tech  1530: d/c instructions given with pt understanding. Made aware of appt and medication for pain.  pt left unit with family ambulatory.

## 2017-11-21 ENCOUNTER — Emergency Department: Payer: BC Managed Care – PPO

## 2017-11-21 ENCOUNTER — Emergency Department
Admission: EM | Admit: 2017-11-21 | Discharge: 2017-11-21 | Disposition: A | Discharge status: Home / Self Care | Payer: BC Managed Care – PPO | Attending: Emergency Medicine | Admitting: Emergency Medicine

## 2017-11-21 ENCOUNTER — Encounter: Payer: Self-pay | Admitting: Emergency Medicine

## 2017-11-21 DIAGNOSIS — Z79899 Other long term (current) drug therapy: Secondary | ICD-10-CM | POA: Insufficient documentation

## 2017-11-21 DIAGNOSIS — Y939 Activity, unspecified: Secondary | ICD-10-CM | POA: Insufficient documentation

## 2017-11-21 DIAGNOSIS — Y999 Unspecified external cause status: Secondary | ICD-10-CM | POA: Insufficient documentation

## 2017-11-21 DIAGNOSIS — Y929 Unspecified place or not applicable: Secondary | ICD-10-CM | POA: Diagnosis not present

## 2017-11-21 DIAGNOSIS — S62307A Unspecified fracture of fifth metacarpal bone, left hand, initial encounter for closed fracture: Secondary | ICD-10-CM | POA: Insufficient documentation

## 2017-11-21 DIAGNOSIS — S62112A Displaced fracture of triquetrum [cuneiform] bone, left wrist, initial encounter for closed fracture: Secondary | ICD-10-CM | POA: Diagnosis not present

## 2017-11-21 DIAGNOSIS — S52571A Other intraarticular fracture of lower end of right radius, initial encounter for closed fracture: Secondary | ICD-10-CM

## 2017-11-21 DIAGNOSIS — S52351A Displaced comminuted fracture of shaft of radius, right arm, initial encounter for closed fracture: Secondary | ICD-10-CM | POA: Diagnosis not present

## 2017-11-21 DIAGNOSIS — S62339A Displaced fracture of neck of unspecified metacarpal bone, initial encounter for closed fracture: Secondary | ICD-10-CM

## 2017-11-21 DIAGNOSIS — S59911A Unspecified injury of right forearm, initial encounter: Secondary | ICD-10-CM | POA: Diagnosis present

## 2017-11-21 DIAGNOSIS — I10 Essential (primary) hypertension: Secondary | ICD-10-CM | POA: Insufficient documentation

## 2017-11-21 DIAGNOSIS — W2211XA Striking against or struck by driver side automobile airbag, initial encounter: Secondary | ICD-10-CM | POA: Diagnosis not present

## 2017-11-21 DIAGNOSIS — R52 Pain, unspecified: Secondary | ICD-10-CM

## 2017-11-21 MED ORDER — ONDANSETRON 4 MG PO TBDP: 4.0000 mg | ORAL_TABLET | Freq: Three times a day (TID) | ORAL | 0 refills | Status: DC | PRN

## 2017-11-21 MED ORDER — OXYCODONE-ACETAMINOPHEN 5-325 MG PO TABS: 1.0000 | ORAL_TABLET | Freq: Four times a day (QID) | ORAL | 0 refills | Status: DC | PRN

## 2017-11-21 MED ORDER — ONDANSETRON 8 MG PO TBDP
8.0000 mg | ORAL_TABLET | Freq: Once | ORAL | Status: AC
  Administered 2017-11-21: 8 mg via ORAL
  Filled 2017-11-21: qty 1

## 2017-11-21 MED ORDER — OXYCODONE-ACETAMINOPHEN 5-325 MG PO TABS
1.0000 | ORAL_TABLET | Freq: Once | ORAL | Status: AC
  Administered 2017-11-21: 1 via ORAL
  Filled 2017-11-21: qty 1
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
## 2017-11-22 ENCOUNTER — Other Ambulatory Visit: Payer: Self-pay | Admitting: Emergency Medicine

## 2017-11-22 ENCOUNTER — Ambulatory Visit
Admission: RE | Admit: 2017-11-22 | Discharge: 2017-11-22 | Disposition: A | Discharge status: Home / Self Care | Payer: BC Managed Care – PPO | Source: Ambulatory Visit | Attending: Emergency Medicine | Admitting: Emergency Medicine

## 2017-11-22 DIAGNOSIS — M85642 Other cyst of bone, left hand: Secondary | ICD-10-CM | POA: Insufficient documentation

## 2017-11-22 DIAGNOSIS — S62327A Displaced fracture of shaft of fifth metacarpal bone, left hand, initial encounter for closed fracture: Secondary | ICD-10-CM | POA: Diagnosis not present

## 2017-11-22 DIAGNOSIS — X58XXXA Exposure to other specified factors, initial encounter: Secondary | ICD-10-CM | POA: Insufficient documentation

## 2017-11-22 DIAGNOSIS — R52 Pain, unspecified: Secondary | ICD-10-CM

## 2017-11-26 ENCOUNTER — Ambulatory Visit
Admission: RE | Admit: 2017-11-26 | Discharge: 2017-11-26 | Disposition: A | Discharge status: Home / Self Care | Payer: BC Managed Care – PPO | Source: Ambulatory Visit | Attending: Orthopedic Surgery | Admitting: Orthopedic Surgery

## 2017-11-26 ENCOUNTER — Other Ambulatory Visit: Payer: Self-pay

## 2017-11-26 ENCOUNTER — Ambulatory Visit: Payer: BC Managed Care – PPO | Admitting: Anesthesiology

## 2017-11-26 ENCOUNTER — Ambulatory Visit: Payer: BC Managed Care – PPO

## 2017-11-26 ENCOUNTER — Encounter
Admission: RE | Disposition: A | Discharge status: Home / Self Care | Payer: Self-pay | Source: Ambulatory Visit | Attending: Orthopedic Surgery

## 2017-11-26 ENCOUNTER — Encounter: Payer: Self-pay | Admitting: *Deleted

## 2017-11-26 DIAGNOSIS — Z7951 Long term (current) use of inhaled steroids: Secondary | ICD-10-CM | POA: Diagnosis not present

## 2017-11-26 DIAGNOSIS — M329 Systemic lupus erythematosus, unspecified: Secondary | ICD-10-CM | POA: Insufficient documentation

## 2017-11-26 DIAGNOSIS — F419 Anxiety disorder, unspecified: Secondary | ICD-10-CM | POA: Diagnosis not present

## 2017-11-26 DIAGNOSIS — Y92410 Unspecified street and highway as the place of occurrence of the external cause: Secondary | ICD-10-CM | POA: Insufficient documentation

## 2017-11-26 DIAGNOSIS — I10 Essential (primary) hypertension: Secondary | ICD-10-CM | POA: Insufficient documentation

## 2017-11-26 DIAGNOSIS — Z8673 Personal history of transient ischemic attack (TIA), and cerebral infarction without residual deficits: Secondary | ICD-10-CM | POA: Diagnosis not present

## 2017-11-26 DIAGNOSIS — Z79899 Other long term (current) drug therapy: Secondary | ICD-10-CM | POA: Insufficient documentation

## 2017-11-26 DIAGNOSIS — F329 Major depressive disorder, single episode, unspecified: Secondary | ICD-10-CM | POA: Diagnosis not present

## 2017-11-26 DIAGNOSIS — S52571A Other intraarticular fracture of lower end of right radius, initial encounter for closed fracture: Secondary | ICD-10-CM | POA: Diagnosis present

## 2017-11-26 DIAGNOSIS — Z9889 Other specified postprocedural states: Secondary | ICD-10-CM

## 2017-11-26 DIAGNOSIS — G43909 Migraine, unspecified, not intractable, without status migrainosus: Secondary | ICD-10-CM | POA: Diagnosis not present

## 2017-11-26 DIAGNOSIS — Z8781 Personal history of (healed) traumatic fracture: Secondary | ICD-10-CM

## 2017-11-26 DIAGNOSIS — M797 Fibromyalgia: Secondary | ICD-10-CM | POA: Diagnosis not present

## 2017-11-26 HISTORY — PX: OPEN REDUCTION INTERNAL FIXATION (ORIF) DISTAL RADIAL FRACTURE: SHX5989

## 2017-11-26 LAB — POCT PREGNANCY, URINE: Preg Test, Ur: NEGATIVE

## 2017-11-26 SURGERY — OPEN REDUCTION INTERNAL FIXATION (ORIF) DISTAL RADIUS FRACTURE
Anesthesia: General | Site: Wrist | Laterality: Right | Wound class: Clean

## 2017-11-26 MED ORDER — PROPOFOL 10 MG/ML IV BOLUS
INTRAVENOUS | Status: AC
  Filled 2017-11-26: qty 20

## 2017-11-26 MED ORDER — FENTANYL CITRATE (PF) 100 MCG/2ML IJ SOLN
INTRAMUSCULAR | Status: AC
  Administered 2017-11-26: 25 ug via INTRAVENOUS
  Filled 2017-11-26: qty 2

## 2017-11-26 MED ORDER — OXYCODONE-ACETAMINOPHEN 7.5-325 MG PO TABS: 1.0000 | ORAL_TABLET | ORAL | 0 refills | Status: DC | PRN

## 2017-11-26 MED ORDER — OXYCODONE-ACETAMINOPHEN 7.5-325 MG PO TABS
ORAL_TABLET | ORAL | Status: AC
  Filled 2017-11-26: qty 1

## 2017-11-26 MED ORDER — LIDOCAINE HCL (CARDIAC) 20 MG/ML IV SOLN
INTRAVENOUS | Status: DC | PRN
  Administered 2017-11-26: 100 mg via INTRAVENOUS

## 2017-11-26 MED ORDER — FENTANYL CITRATE (PF) 100 MCG/2ML IJ SOLN
INTRAMUSCULAR | Status: AC
Start: 2017-11-26 — End: ?
  Filled 2017-11-26: qty 2

## 2017-11-26 MED ORDER — ONDANSETRON HCL 4 MG/2ML IJ SOLN: 4.0000 mg | Freq: Four times a day (QID) | INTRAMUSCULAR | Status: DC | PRN

## 2017-11-26 MED ORDER — GLYCOPYRROLATE 0.2 MG/ML IJ SOLN
INTRAMUSCULAR | Status: DC | PRN
  Administered 2017-11-26: 0.2 mg via INTRAVENOUS

## 2017-11-26 MED ORDER — MIDAZOLAM HCL 2 MG/2ML IJ SOLN
INTRAMUSCULAR | Status: AC
  Filled 2017-11-26: qty 2

## 2017-11-26 MED ORDER — ONDANSETRON HCL 4 MG/2ML IJ SOLN
INTRAMUSCULAR | Status: DC | PRN
  Administered 2017-11-26: 4 mg via INTRAVENOUS

## 2017-11-26 MED ORDER — PROPOFOL 10 MG/ML IV BOLUS
INTRAVENOUS | Status: DC | PRN
  Administered 2017-11-26: 150 mg via INTRAVENOUS

## 2017-11-26 MED ORDER — MIDAZOLAM HCL 2 MG/2ML IJ SOLN
INTRAMUSCULAR | Status: DC | PRN
  Administered 2017-11-26: 2 mg via INTRAVENOUS

## 2017-11-26 MED ORDER — FENTANYL CITRATE (PF) 100 MCG/2ML IJ SOLN
25.0000 ug | INTRAMUSCULAR | Status: DC | PRN
  Administered 2017-11-26 (×4): 25 ug via INTRAVENOUS

## 2017-11-26 MED ORDER — ACETAMINOPHEN 10 MG/ML IV SOLN
INTRAVENOUS | Status: AC
  Filled 2017-11-26: qty 100

## 2017-11-26 MED ORDER — SODIUM CHLORIDE 0.9 % IV SOLN: INTRAVENOUS | Status: DC

## 2017-11-26 MED ORDER — LACTATED RINGERS IV SOLN
Freq: Once | INTRAVENOUS | Status: AC
  Administered 2017-11-26: 10:00:00 via INTRAVENOUS

## 2017-11-26 MED ORDER — CEFAZOLIN SODIUM-DEXTROSE 2-4 GM/100ML-% IV SOLN
INTRAVENOUS | Status: AC
  Filled 2017-11-26: qty 100

## 2017-11-26 MED ORDER — NEOMYCIN-POLYMYXIN B GU 40-200000 IR SOLN
Status: DC | PRN
  Administered 2017-11-26: 2 mL

## 2017-11-26 MED ORDER — METOCLOPRAMIDE HCL 5 MG/ML IJ SOLN: 5.0000 mg | Freq: Three times a day (TID) | INTRAMUSCULAR | Status: DC | PRN

## 2017-11-26 MED ORDER — ACETAMINOPHEN 10 MG/ML IV SOLN
INTRAVENOUS | Status: DC | PRN
  Administered 2017-11-26: 1000 mg via INTRAVENOUS

## 2017-11-26 MED ORDER — ONDANSETRON HCL 4 MG PO TABS: 4.0000 mg | ORAL_TABLET | Freq: Four times a day (QID) | ORAL | Status: DC | PRN

## 2017-11-26 MED ORDER — DEXAMETHASONE SODIUM PHOSPHATE 10 MG/ML IJ SOLN
INTRAMUSCULAR | Status: DC | PRN
  Administered 2017-11-26: 10 mg via INTRAVENOUS

## 2017-11-26 MED ORDER — CEFAZOLIN SODIUM-DEXTROSE 2-4 GM/100ML-% IV SOLN
2.0000 g | Freq: Once | INTRAVENOUS | Status: AC
  Administered 2017-11-26: 2 g via INTRAVENOUS

## 2017-11-26 MED ORDER — FENTANYL CITRATE (PF) 100 MCG/2ML IJ SOLN
INTRAMUSCULAR | Status: AC
  Filled 2017-11-26: qty 2

## 2017-11-26 MED ORDER — METOCLOPRAMIDE HCL 10 MG PO TABS: 5.0000 mg | ORAL_TABLET | Freq: Three times a day (TID) | ORAL | Status: DC | PRN

## 2017-11-26 MED ORDER — ONDANSETRON HCL 4 MG/2ML IJ SOLN: 4.0000 mg | Freq: Once | INTRAMUSCULAR | Status: DC | PRN

## 2017-11-26 MED ORDER — FENTANYL CITRATE (PF) 100 MCG/2ML IJ SOLN
INTRAMUSCULAR | Status: DC | PRN
  Administered 2017-11-26 (×4): 50 ug via INTRAVENOUS

## 2017-11-26 MED ORDER — OXYCODONE-ACETAMINOPHEN 7.5-325 MG PO TABS
1.0000 | ORAL_TABLET | ORAL | Status: DC | PRN
Start: 2017-11-26 — End: 2017-11-26
  Administered 2017-11-26: 1 via ORAL
  Filled 2017-11-26 (×2): qty 1

## 2017-11-26 SURGICAL SUPPLY — 42 items
BANDAGE ACE 4X5 VEL STRL LF (GAUZE/BANDAGES/DRESSINGS) ×3 IMPLANT
BIT DRILL 2 FAST STEP (BIT) ×1 IMPLANT
BIT DRILL 2.5X4 QC (BIT) ×1 IMPLANT
CANISTER SUCT 1200ML W/VALVE (MISCELLANEOUS) ×2 IMPLANT
CHLORAPREP W/TINT 26ML (MISCELLANEOUS) ×2 IMPLANT
CUFF TOURN 18 STER (MISCELLANEOUS) ×1 IMPLANT
DRAPE FLUOR MINI C-ARM 54X84 (DRAPES) ×2 IMPLANT
ELECT REM PT RETURN 9FT ADLT (ELECTROSURGICAL) ×2
ELECTRODE REM PT RTRN 9FT ADLT (ELECTROSURGICAL) ×1 IMPLANT
GAUZE PETRO XEROFOAM 1X8 (MISCELLANEOUS) ×4 IMPLANT
GAUZE SPONGE 4X4 12PLY STRL (GAUZE/BANDAGES/DRESSINGS) ×2 IMPLANT
GLOVE BIOGEL PI IND STRL 7.0 (GLOVE) IMPLANT
GLOVE BIOGEL PI INDICATOR 7.0 (GLOVE) ×5
GLOVE SURG SYN 9.0  PF PI (GLOVE) ×1
GLOVE SURG SYN 9.0 PF PI (GLOVE) ×1 IMPLANT
GOWN SRG 2XL LVL 4 RGLN SLV (GOWNS) ×1 IMPLANT
GOWN STRL NON-REIN 2XL LVL4 (GOWNS) ×2
GOWN STRL REUS W/ TWL LRG LVL3 (GOWN DISPOSABLE) ×1 IMPLANT
GOWN STRL REUS W/TWL LRG LVL3 (GOWN DISPOSABLE) ×6
K-WIRE 1.6 (WIRE) ×2
K-WIRE FX5X1.6XNS BN SS (WIRE) ×1
KIT TURNOVER KIT A (KITS) ×2 IMPLANT
KWIRE FX5X1.6XNS BN SS (WIRE) IMPLANT
NDL FILTER BLUNT 18X1 1/2 (NEEDLE) ×1 IMPLANT
NEEDLE FILTER BLUNT 18X 1/2SAF (NEEDLE) ×1
NEEDLE FILTER BLUNT 18X1 1/2 (NEEDLE) ×1 IMPLANT
NS IRRIG 500ML POUR BTL (IV SOLUTION) ×2 IMPLANT
PACK EXTREMITY ARMC (MISCELLANEOUS) ×2 IMPLANT
PAD CAST CTTN 4X4 STRL (SOFTGOODS) ×2 IMPLANT
PADDING CAST COTTON 4X4 STRL (SOFTGOODS) ×4
PEG SUBCHONDRAL SMOOTH 2.0X14 (Peg) ×1 IMPLANT
PEG SUBCHONDRAL SMOOTH 2.0X20 (Peg) ×2 IMPLANT
PEG SUBCHONDRAL SMOOTH 2.0X22 (Peg) ×3 IMPLANT
PEG SUBCHONDRAL SMOOTH 2.0X24 (Peg) ×2 IMPLANT
PLATE SHORT 24.4X51.3 RT (Plate) ×1 IMPLANT
SCREW CORT 3.5X10 LNG (Screw) ×3 IMPLANT
SPLINT CAST 1 STEP 3X12 (MISCELLANEOUS) ×2 IMPLANT
SUT ETHILON 4-0 (SUTURE) ×2
SUT ETHILON 4-0 FS2 18XMFL BLK (SUTURE) ×1
SUT VICRYL 3-0 27IN (SUTURE) ×2 IMPLANT
SUTURE ETHLN 4-0 FS2 18XMF BLK (SUTURE) ×1 IMPLANT
SYR 3ML LL SCALE MARK (SYRINGE) ×2 IMPLANT

## 2017-11-26 NOTE — Transfer of Care (Signed)
Immediate Anesthesia Transfer of Care Note  Patient: Linda Vargas  Procedure(s) Performed: OPEN REDUCTION INTERNAL FIXATION (ORIF) DISTAL RADIAL FRACTURE (Right Wrist)  Patient Location: PACU  Anesthesia Type:General  Level of Consciousness: awake, alert  and oriented  Airway & Oxygen Therapy: Patient Spontanous Breathing and Patient connected to face mask oxygen  Post-op Assessment: Report given to RN and Post -op Vital signs reviewed and stable  Post vital signs: Reviewed and stable  Last Vitals:  Vitals:   11/26/17 0835  BP: (!) 124/92  Pulse: 77  Resp: 16  Temp: 37 C  SpO2: 100%    Last Pain:  Vitals:   11/26/17 0835  TempSrc: Oral  PainSc: 10-Worst pain ever      Patients Stated Pain Goal: 3 (58/83/25 4982)  Complications: No apparent anesthesia complications

## 2017-11-26 NOTE — OR Nursing (Signed)
Patient requested to have dressing reapplied to left hand. MD informed.Left hand dressing applied - webril, fiberglass splint, & ace wrap by MD.
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
## 2017-11-26 NOTE — Discharge Instructions (Addendum)
Keep arm elevated ice to back of hands. Work on finger range of motion. Pain medicine as directed keep dressings clean and dry. If fingers swell a great deal loosen Ace wrap  General Anesthesia, Adult, Care After These instructions provide you with information about caring for yourself after your procedure. Your health care provider may also give you more specific instructions. Your treatment has been planned according to current medical practices, but problems sometimes occur. Call your health care provider if you have any problems or questions after your procedure. What can I expect after the procedure? After the procedure, it is common to have:  Vomiting.  A sore throat.  Mental slowness.  It is common to feel:  Nauseous.  Cold or shivery.  Sleepy.  Tired.  Sore or achy, even in parts of your body where you did not have surgery.  Follow these instructions at home: For at least 24 hours after the procedure:  Do not: ? Participate in activities where you could fall or become injured. ? Drive. ? Use heavy machinery. ? Drink alcohol. ? Take sleeping pills or medicines that cause drowsiness. ? Make important decisions or sign legal documents. ? Take care of children on your own.  Rest. Eating and drinking  If you vomit, drink water, juice, or soup when you can drink without vomiting.  Drink enough fluid to keep your urine clear or pale yellow.  Make sure you have little or no nausea before eating solid foods.  Follow the diet recommended by your health care provider. General instructions  Have a responsible adult stay with you until you are awake and alert.  Return to your normal activities as told by your health care provider. Ask your health care provider what activities are safe for you.  Take over-the-counter and prescription medicines only as told by your health care provider.  If you smoke, do not smoke without supervision.  Keep all follow-up visits as  told by your health care provider. This is important. Contact a health care provider if:  You continue to have nausea or vomiting at home, and medicines are not helpful.  You cannot drink fluids or start eating again.  You cannot urinate after 8-12 hours.  You develop a skin rash.  You have fever.  You have increasing redness at the site of your procedure. Get help right away if:  You have difficulty breathing.  You have chest pain.  You have unexpected bleeding.  You feel that you are having a life-threatening or urgent problem. This information is not intended to replace advice given to you by your health care provider. Make sure you discuss any questions you have with your health care provider. Document Released: 01/05/2001 Document Revised: 03/03/2016 Document Reviewed: 09/13/2015 Elsevier Interactive Patient Education  [employer].

## 2017-11-26 NOTE — H&P (Signed)
Reviewed paper H+P, will be scanned into chart. No changes noted.  

## 2017-11-26 NOTE — Op Note (Signed)
11/26/2017  11:34 AM  PATIENT:  Linda Vargas  42 y.o. female  PRE-OPERATIVE DIAGNOSIS:  OTHER CLOSED FRACTURE OF DISTAL END OF RIGHT RADIUS, intra-articular with 3 fragments  POST-OPERATIVE DIAGNOSIS:  OTHER CLOSED FRACTURE OF DISTAL END OF RIGHT RADIUS same  PROCEDURE:  Procedure(s): OPEN REDUCTION INTERNAL FIXATION (ORIF) DISTAL RADIAL FRACTURE (Right)  SURGEON: Laurene Footman, MD  ASSISTANTS: None  ANESTHESIA:   general  EBL:  Total I/O In: 600 [I.V.:600] Out: 5 [Blood:5]  BLOOD ADMINISTERED:none  DRAINS: none   LOCAL MEDICATIONS USED:  NONE  SPECIMEN:  No Specimen  DISPOSITION OF SPECIMEN:  N/A  COUNTS:  YES  TOURNIQUET:   Total Tourniquet Time Documented: Upper Arm (Right) - 32 minutes Total: Upper Arm (Right) - 32 minutes   IMPLANTS: [employer] hand innovations DVR volar plate with multiple smooth pegs and screws  DICTATION: .Dragon Dictation patient was brought the operating room and after adequate anesthesia was obtained, the right arm was prepped and draped in sterile fashion. After patient identification and timeout procedures were completed tourniquet was raised and fingertrap traction was applied off the end of the table with 10 pounds applied. Volar approach is made centered over the FCR tendon the tendon sheath incised and retracted radially to protect the associated vessels. The deep fascia was incised and the pronator was elevated off the proximal fragment and distal fragment or having been stripped. The fractures remained displaced but with gentle manipulation with and use of a Freer elevator the volar fragment could be reduced near anatomically with radial styloid and dorsal fragments also lining up well. This is with traction applied and with a distal first approach and the pegs placed at the level of the subchondral bone of the standard width plate was applied and all fragments appeared to be captured adequately. The plate was then brought down to the  shaft using 310 mm screws and it there appeared to be restoration of both length radial inclination and volar tilt joint reduction was near anatomic. This point the traction was released with of examination under fluoroscopy in both AP and lateral projections with motion the fracture appeared stable. The wound was then thoroughly irrigated tourniquet let down and the wound closed with 3-0 Vicryl subcutaneously and 4-0 nylon for the skin. Xeroform 4 x 4 web roll and a volar splint were applied along with an Ace wrap. On the left hand she had previously had a splint for base of the fourth and fifth metacarpal fractures a new splint was applied today with web roll volar splint and Ace wrap note no charge for this part of the procedure  PLAN OF CARE: Discharge to home after PACU  PATIENT DISPOSITION:  PACU - hemodynamically stable.

## 2017-11-26 NOTE — Anesthesia Post-op Follow-up Note (Signed)
Anesthesia QCDR form completed.        

## 2017-11-26 NOTE — Anesthesia Procedure Notes (Signed)
Procedure Name: LMA Insertion Date/Time: 11/26/2017 10:44 AM Performed by: Nelda [place], CRNA Pre-anesthesia Checklist: Patient identified, Patient being monitored, Timeout performed, Emergency Drugs available and Suction available Patient Re-evaluated:Patient Re-evaluated prior to induction Oxygen Delivery Method: Circle system utilized Preoxygenation: Pre-oxygenation with 100% oxygen Induction Type: IV induction Ventilation: Mask ventilation without difficulty LMA: LMA inserted LMA Size: 3.5 Tube type: Oral Number of attempts: 1 Placement Confirmation: positive ETCO2 and breath sounds checked- equal and bilateral Tube secured with: Tape Dental Injury: Teeth and Oropharynx as per pre-operative assessment

## 2017-11-26 NOTE — Anesthesia Postprocedure Evaluation (Signed)
Anesthesia Post Note  Patient: Linda Vargas  Procedure(s) Performed: OPEN REDUCTION INTERNAL FIXATION (ORIF) DISTAL RADIAL FRACTURE (Right Wrist)  Patient location during evaluation: PACU Anesthesia Type: General Level of consciousness: awake and alert Pain management: pain level controlled Vital Signs Assessment: post-procedure vital signs reviewed and stable Respiratory status: spontaneous breathing, nonlabored ventilation, respiratory function stable and patient connected to nasal cannula oxygen Cardiovascular status: blood pressure returned to baseline and stable Postop Assessment: no apparent nausea or vomiting Anesthetic complications: no     Last Vitals:  Vitals:   11/26/17 1220 11/26/17 1226  BP:  (!) 182/92  Pulse: 71 67  Resp: 16 14  Temp:  36.5 C  SpO2: 97% 100%    Last Pain:  Vitals:   11/26/17 1226  TempSrc: Temporal  PainSc: 10-Worst pain ever                 Caio Devera S

## 2017-12-21 ENCOUNTER — Encounter: Payer: Self-pay | Admitting: Occupational Therapy

## 2017-12-21 ENCOUNTER — Ambulatory Visit: Payer: BC Managed Care – PPO | Attending: Orthopedic Surgery | Admitting: Occupational Therapy

## 2017-12-21 ENCOUNTER — Other Ambulatory Visit: Payer: Self-pay

## 2017-12-21 DIAGNOSIS — M79641 Pain in right hand: Secondary | ICD-10-CM | POA: Diagnosis present

## 2017-12-21 DIAGNOSIS — M25631 Stiffness of right wrist, not elsewhere classified: Secondary | ICD-10-CM | POA: Diagnosis present

## 2017-12-21 DIAGNOSIS — M6281 Muscle weakness (generalized): Secondary | ICD-10-CM | POA: Diagnosis present

## 2017-12-21 DIAGNOSIS — M25641 Stiffness of right hand, not elsewhere classified: Secondary | ICD-10-CM | POA: Insufficient documentation

## 2017-12-21 DIAGNOSIS — M25531 Pain in right wrist: Secondary | ICD-10-CM | POA: Diagnosis present
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
## 2017-12-21 NOTE — Patient Instructions (Addendum)
Contrast -using heat and cold packs Scar massage   and cica scar pad for night time   Soft tissue massage over volar wrist , MC's , thumb webspace Wrist AAROM for UD , wrist extention  Wrist AROM in all planes - including sup /pro with elbow to side  Thumb PA and RA  Opposition  PROM for digits extention on table - rolling over cylinder for gentle wrist and digits extention   Wrist splint -fitted with new prefab to maintain wrist neutral  Digits tendon glides to be done in splint

## 2017-12-22 ENCOUNTER — Ambulatory Visit: Payer: BC Managed Care – PPO | Admitting: Occupational Therapy

## 2017-12-22 DIAGNOSIS — M25641 Stiffness of right hand, not elsewhere classified: Secondary | ICD-10-CM

## 2017-12-22 DIAGNOSIS — M6281 Muscle weakness (generalized): Secondary | ICD-10-CM

## 2017-12-22 DIAGNOSIS — M25531 Pain in right wrist: Secondary | ICD-10-CM

## 2017-12-22 DIAGNOSIS — M79641 Pain in right hand: Secondary | ICD-10-CM

## 2017-12-22 DIAGNOSIS — M25631 Stiffness of right wrist, not elsewhere classified: Secondary | ICD-10-CM

## 2017-12-22 NOTE — Therapy (Signed)
[facility] [address], [place], [address] Phone: [phone]   Fax:  [phone]  Occupational Therapy Treatment  Patient Details  Name: Linda Vargas MRN: [id number] Date of Birth: 11/10/1975 Referring Provider: Arvella Nigh    Encounter Date: 12/22/2017  OT End of Session - 12/22/17 2022    Visit Number  2    Number of Visits  16    Date for OT Re-Evaluation  02/01/18    OT Start Time  1410    OT Stop Time  1452    OT Time Calculation (min)  42 min    Activity Tolerance  Patient tolerated treatment well    Behavior During Therapy  [facility] for tasks assessed/performed       Past Medical History:  Diagnosis Date  . Anxiety   . Connective tissue disorder (HCC)    undifferentiated  . Fibromyalgia   . Hypertension   . Lupus   . Migraine   . Seasonal allergic rhinitis     Past Surgical History:  Procedure Laterality Date  . CHOLECYSTECTOMY  2012  . DILATION AND CURETTAGE OF UTERUS  2011  . DILATION AND CURETTAGE OF UTERUS  01/17/2012   Procedure: DILATATION AND CURETTAGE;  Surgeon: Marylynn Pearson, MD;  Location: [facility];  Service: Gynecology;  Laterality: N/A;  Dilatation and curratage, insertion of Bakri Balloon  . FOOT SURGERY    . OPEN REDUCTION INTERNAL FIXATION (ORIF) DISTAL RADIAL FRACTURE Right 11/26/2017   Procedure: OPEN REDUCTION INTERNAL FIXATION (ORIF) DISTAL RADIAL FRACTURE;  Surgeon: Hessie Knows, MD;  Location: [facility];  Service: Orthopedics;  Laterality: Right;  . TONSILLECTOMY      There were no vitals filed for this visit.  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  Yes    Pain Score  7     Pain Location  Wrist    Pain Orientation  Right    Pain Descriptors / Indicators  Aching    Pain Onset  1 to 4 weeks ago        Pt ed on donning and wearing of splint - will assess if need custom wrist splint next time to align wrist  Edema increase at wrist to 1.7 cm             OT Treatments/Exercises (OP) - 12/22/17 0001      RUE Contrast Bath   Time  11 minutes    Comments  at [facility] to decrease pain and increase ROM      pain decrease to 3-4/10  During contrast done gentle stretch for wrist extention to neutral and digits extention    Soft tissue mobs done to webspace and joint mobst to MC's of R hand  Graston tool nr 2 done sweeping over volar forearm and volar hand and digits  Scar massage done and reed pt     Wrist AAROM for UD , wrist extention done  Wrist AROM in all  planes - including sup /pro with elbow to side  Thumb PA and RA AAROM  Opposition to all digits  PROM for digits extention on table - rolling  Teal putty facilitation  wrist extention and digits extention   Digits tendon glides done in splint -   same HEP  3 x day  And WRIst AROM for UD and extention other 2 x day         OT Education - 12/22/17 2022    Education provided  Yes    Education Details  review of HEP  , splint fitting and reed on wearing     Person(s) Educated  Patient    Methods  Explanation;Demonstration    Comprehension  Verbalized understanding;Returned demonstration;Verbal cues required       OT Short Term Goals - 12/21/17 1839      OT SHORT TERM GOAL #1   Title  Pt R hand digits able to make full fist and touch palm , and extend digits to reach with hand in pocket     Baseline  see flowsheet - limited in flexion and extnetion - pain 8/10 with flexion of digits     Time  2    Period  Weeks    Status  New    Target Date  01/04/18      OT SHORT TERM GOAL #2   Title   R wrist  AROM improve for pt to maintain midline to use hand in ADL's with more ease     Baseline  pt's wrist in RD, and flexion at rest- contracted - able to get to midline with effort but not able to maintain     Time  1    Period  Weeks    Status  New    Target Date  12/28/17      OT SHORT TERM GOAL #3   Title  Pain on PRWHE improve in R hand and wrist by more than 15 points     Baseline  at eval pain on PRHWE is 34/50     Time  3    Period  Weeks    Status  New    Target Date  01/11/18        OT Long Term Goals - 12/21/17 1845      OT LONG TERM GOAL #1   Title  R wrist AROM improve with more than 10-30 degrees in all planes to use R hand in more than 50% of ADL's     Baseline  see flowsheet for ROM , and not using hand in any tasks     Time  5    Period  Weeks    Status  New    Target Date  01/25/18      OT LONG TERM GOAL #2   Title  R grip strength increase to more than 50% compare to L hand to carry and hold objects at home and school     Baseline  NT - 4 wks s/p     Time  6    Period  Weeks    Status  New    Target Date  02/01/18      OT LONG TERM GOAL #3   Title  Function score on PRWHE imprrove with more than 25 points     Baseline  Function score at eval 47/50     Time  6    Period  Weeks    Status  New  Target Date  02/01/18            Plan - 12/22/17 2023    Clinical Impression Statement  Pt present with pain again increase to 7/10 and with edema at wrist more than 1.7 cm , and in prefab wrist splint - also radial deviating and in flexion - reed this date again on donning spllint for more support and isotoner glove for compression at wrist - will assess next time if need custom splint     Occupational performance deficits (Please refer to evaluation for details):  ADL's;IADL's;Work;Play;Leisure;Social Participation    Rehab Potential  Good    OT Frequency  -- 2-3 x wk     OT Duration  6 weeks    OT Treatment/Interventions  Self-care/ADL  training;Therapeutic exercise;Moist Heat;Splinting;Patient/family education;Scar mobilization;Fluidtherapy;Cryotherapy;Contrast Bath;Manual Therapy;Passive range of motion    Plan  assess progress with HEP provided , modify and review as needed     Clinical Decision Making  Multiple treatment options, significant modification of task necessary    OT Home Exercise Plan  see pt instruction     Consulted and Agree with Plan of Care  Patient       Patient will benefit from skilled therapeutic intervention in order to improve the following deficits and impairments:  Pain, Impaired flexibility, Increased edema, Decreased coordination, Impaired sensation, Decreased scar mobility, Decreased strength, Decreased range of motion, Impaired UE functional use, Decreased knowledge of precautions  Visit Diagnosis: Stiffness of right hand, not elsewhere classified  Stiffness of right wrist, not elsewhere classified  Pain in right hand  Pain in right wrist  Muscle weakness (generalized)    Problem List Patient Active Problem List   Diagnosis Date Noted  . TIA (transient ischemic attack) 10/12/2015  . Pregnancy, supervision of, high-risk 06/27/2011  . Lupus 06/27/2011  . 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 Vaginal bleeding in pregnancy 06/27/2011  . Vaginal lesion 06/27/2011    Rosalyn Gess OTR/L,CLT 12/22/2017, 8:25 PM  [facility] [address], [place], [address] Phone: [phone]   Fax:  [phone]  Name: Linda Vargas MRN: [id number] Date of Birth: 1976-04-06

## 2017-12-22 NOTE — Patient Instructions (Signed)
Same HEP fitted with isotoner glove to provide more compression over wrist under splint

## 2017-12-25 ENCOUNTER — Ambulatory Visit: Payer: BC Managed Care – PPO | Admitting: Occupational Therapy

## 2017-12-25 DIAGNOSIS — M25531 Pain in right wrist: Secondary | ICD-10-CM

## 2017-12-25 DIAGNOSIS — M6281 Muscle weakness (generalized): Secondary | ICD-10-CM

## 2017-12-25 DIAGNOSIS — M25641 Stiffness of right hand, not elsewhere classified: Secondary | ICD-10-CM

## 2017-12-25 DIAGNOSIS — M25631 Stiffness of right wrist, not elsewhere classified: Secondary | ICD-10-CM

## 2017-12-25 DIAGNOSIS — M79641 Pain in right hand: Secondary | ICD-10-CM

## 2017-12-25 NOTE — Patient Instructions (Signed)
Same HEP   wear custom splint for wrist to rest in neutral  Focus on supination - sue heating pad 2nd and 3rd cycle for gentle stretch

## 2017-12-25 NOTE — Therapy (Signed)
[place] [facility] [address] [place], [address] Phone: [phone]   Fax:  [phone]  Occupational Therapy Treatment  Patient Details  Name: Linda Vargas MRN: [id number] Date of Birth: 1976-10-11 Referring Provider: Arvella Nigh    Encounter Date: 12/25/2017  OT End of Session - 12/25/17 1652    Visit Number  3    Number of Visits  16    Date for OT Re-Evaluation  02/01/18    OT Start Time  1217    OT Stop Time  1308    OT Time Calculation (min)  51 min    Activity Tolerance  Patient tolerated treatment well    Behavior During Therapy  [facility] for tasks assessed/performed       Past Medical History:  Diagnosis Date  . Anxiety   . Connective tissue disorder (HCC)    undifferentiated  . Fibromyalgia   . Hypertension   . Lupus   . Migraine   . Seasonal allergic rhinitis     Past Surgical History:  Procedure Laterality Date  . CHOLECYSTECTOMY  2012  . DILATION AND CURETTAGE OF UTERUS  2011  . DILATION AND CURETTAGE OF UTERUS  01/17/2012   Procedure: DILATATION AND CURETTAGE;  Surgeon: Marylynn Pearson, MD;  Location: [facility];  Service: Gynecology;  Laterality: N/A;  Dilatation and curratage, insertion of Bakri Balloon  . FOOT SURGERY    . OPEN REDUCTION INTERNAL FIXATION (ORIF) DISTAL RADIAL FRACTURE Right 11/26/2017   Procedure: OPEN REDUCTION INTERNAL FIXATION (ORIF) DISTAL RADIAL FRACTURE;  Surgeon: Hessie Knows, MD;  Location: [facility];  Service: Orthopedics;  Laterality: Right;  . TONSILLECTOMY      There were no vitals filed for this visit.  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  Yes    Pain Score  2     Pain Location  Wrist    Pain Orientation  Right    Pain Descriptors / Indicators  Aching    Pain Type  Surgical pain    Pain Onset  1 to 4 weeks ago         [facility] OT Assessment - 12/25/17 0001      AROM   Right Wrist Extension  25 Degrees end of session    Right Wrist Ulnar Deviation  10 Degrees end of session               OT Treatments/Exercises (OP) - 12/25/17 0001      RUE Contrast Bath   Time  11 minutes    Comments  at [facility] to increase extention  increase ROM  decrease pain      While in contrast started fabrication of custom wrist splint   Custom splint for wrist made to keep wrist in neutral at rest   pt ed on wearing and  using of splint -and precautions      During contrast done gentle stretch for wrist extention to neutral and digits extention    Soft tissue mobs done to webspace and joint mobst to MC's of R hand  Graston tool nr 2 done sweeping over volar forearm and volar hand and digits  Scar massage     Wrist AAROM for UD , wrist extention done  Wrist AROM in all planes - including sup /pro with elbow to side  Opposition to all digits   Digits tendon glides done in splint -   same HEP  3 x day  And WRIst AROM for UD and extention other 2 x day        OT Education - 12/25/17 1651    Education provided  Yes    Education Details  splint wearing - supination focus on     Person(s) Educated  Patient    Methods  Explanation;Demonstration;Tactile cues    Comprehension  Verbalized understanding       OT Short Term Goals - 12/21/17 1839      OT SHORT TERM GOAL #1   Title  Pt R hand digits able to make full fist and touch palm , and extend digits to reach with hand in pocket     Baseline  see flowsheet - limited in flexion and extnetion - pain 8/10 with flexion of  digits     Time  2    Period  Weeks    Status  New    Target Date  01/04/18      OT SHORT TERM GOAL #2   Title   R wrist AROM improve for pt to maintain midline to use hand in ADL's with more ease     Baseline  pt's wrist in RD, and flexion at rest- contracted - able to get to midline with effort but not able to maintain     Time  1    Period  Weeks    Status  New    Target Date  12/28/17      OT SHORT TERM GOAL #3   Title  Pain on PRWHE improve in R hand and wrist by more than 15 points     Baseline  at eval pain on PRHWE is 34/50     Time  3    Period  Weeks    Status  New    Target Date  01/11/18        OT Long Term Goals - 12/21/17 1845      OT LONG TERM GOAL #1   Title  R wrist AROM improve with more than 10-30 degrees in all planes to use R hand in more than 50% of ADL's     Baseline  see flowsheet for ROM , and not using hand in any tasks     Time  5    Period  Weeks    Status  New    Target Date  01/25/18      OT LONG TERM GOAL #2   Title  R grip strength increase to more than 50% compare to L hand to carry and hold objects at home and school     Baseline  NT - 4 wks s/p     Time  6    Period  Weeks    Status  New    Target Date  02/01/18      OT LONG TERM GOAL #3   Title  Function score on PRWHE imprrove with more than 25 points     Baseline  Function score at eval 47/50     Time  6    Period  Weeks    Status  New    Target Date  02/01/18            Plan - 12/25/17 1652    Clinical Impression Statement  Pt showed since beginning of week increase wrist extention , RD after heat - increase digits extention and decrease pain -still edema at wrist and wrist still at rest in RD and flexion - fabricate this date custom wrist splint to facilitate wrist in neutral at rest     Occupational performance deficits (Please refer to evaluation for details):  ADL's;IADL's;Work;Play;Leisure;Social Participation    Rehab Potential  Good    Current  Impairments/barriers affecting progress:  Positive indicators: family support, motivation, age Negative indicators: multiple comorbidities    OT Frequency  -- 2-3 x wk     OT Duration  6 weeks    OT Treatment/Interventions  Self-care/ADL training;Therapeutic exercise;Moist Heat;Splinting;Patient/family education;Scar mobilization;Fluidtherapy;Cryotherapy;Contrast Bath;Manual Therapy;Passive range of motion    Plan  assess progress with HEP provided , modify and review as needed     Clinical Decision Making  Multiple treatment options, significant modification of task necessary    OT Home Exercise Plan  see pt instruction     Consulted and Agree with Plan of Care  Patient       Patient will benefit from skilled therapeutic intervention in order to improve the following deficits and impairments:  Pain, Impaired flexibility, Increased edema, Decreased coordination, Impaired sensation, Decreased scar mobility, Decreased strength, Decreased range of motion, Impaired UE functional use, Decreased knowledge of precautions  Visit Diagnosis: Stiffness of right hand, not elsewhere classified  Pain in right hand  Pain in right wrist  Muscle weakness (generalized)  Stiffness of right wrist, not elsewhere classified    Problem List Patient Active Problem List   Diagnosis Date Noted  . TIA (transient ischemic attack) 10/12/2015  . Pregnancy, supervision of, high-risk 06/27/2011  . Lupus 06/27/2011  . 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 Vaginal bleeding in pregnancy 06/27/2011  . Vaginal lesion 06/27/2011    Rosalyn Gess OTR/L,CLT 12/25/2017, 5:00 PM  [place] [facility] [address] [place], [address] Phone: [phone]   Fax:  [phone]  Name: GERA INBODEN MRN: [id number] Date of Birth: [birth year]

## 2017-12-30 ENCOUNTER — Ambulatory Visit: Payer: BC Managed Care – PPO | Admitting: Occupational Therapy

## 2017-12-30 DIAGNOSIS — M79641 Pain in right hand: Secondary | ICD-10-CM

## 2017-12-30 DIAGNOSIS — M25641 Stiffness of right hand, not elsewhere classified: Secondary | ICD-10-CM

## 2017-12-30 DIAGNOSIS — M25531 Pain in right wrist: Secondary | ICD-10-CM

## 2017-12-30 DIAGNOSIS — M6281 Muscle weakness (generalized): Secondary | ICD-10-CM

## 2017-12-30 DIAGNOSIS — M25631 Stiffness of right wrist, not elsewhere classified: Secondary | ICD-10-CM

## 2017-12-30 NOTE — Patient Instructions (Signed)
Add AAROM for wrist RD = hands together  And AAROM for wrist extention - prayer stretch   xtra to do between HEP  And use R hand in act - less than 1 lbs  Full grasp not only 2 point grip

## 2017-12-30 NOTE — Therapy (Signed)
[place] [facility] [address], [place], [address] Phone: [phone]   Fax:  [phone]  Occupational Therapy Treatment  Patient Details  Name: Linda Vargas MRN: [id number] Date of Birth: 01/12/1976 Referring Provider: Arvella Nigh    Encounter Date: 12/30/2017  OT End of Session - 12/30/17 1255    Visit Number  4    Number of Visits  16    Date for OT Re-Evaluation  02/01/18    OT Start Time  1016    OT Stop Time  1105    OT Time Calculation (min)  49 min    Activity Tolerance  Patient tolerated treatment well    Behavior During Therapy  [facility] [place] [facility] for tasks assessed/performed       Past Medical History:  Diagnosis Date  . Anxiety   . Connective tissue disorder (HCC)    undifferentiated  . Fibromyalgia   . Hypertension   . Lupus   . Migraine   . Seasonal allergic rhinitis     Past Surgical History:  Procedure Laterality Date  . CHOLECYSTECTOMY  2012  . DILATION AND CURETTAGE OF UTERUS  2011  . DILATION AND CURETTAGE OF UTERUS  01/17/2012   Procedure: DILATATION AND CURETTAGE;  Surgeon: Marylynn Pearson, MD;  Location: [facility];  Service: Gynecology;  Laterality: N/A;  Dilatation and curratage, insertion of Bakri Balloon  . FOOT SURGERY    . OPEN REDUCTION INTERNAL FIXATION (ORIF) DISTAL RADIAL FRACTURE Right 11/26/2017   Procedure: OPEN REDUCTION INTERNAL FIXATION (ORIF) DISTAL RADIAL FRACTURE;  Surgeon: Hessie Knows, MD;  Location: [facility];  Service: Orthopedics;  Laterality: Right;  . TONSILLECTOMY      There were no vitals filed for this visit.  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  Yes    Pain Score  2     Pain Location  Wrist    Pain Orientation  Right    Pain Descriptors / Indicators  Aching    Pain Type  Surgical pain         [facility] OT Assessment - 12/30/17 0001      AROM   Right Forearm Supination  45 Degrees    Right Wrist Extension  30 Degrees    Right Wrist Flexion  42 Degrees    Right Wrist Radial Deviation  25 Degrees    Right Wrist Ulnar Deviation  2 Degrees       splint adjusted for volar hand piece -to allow strap thru webspace to support hand in UD more  And pt to take off for hour during day and 2 hrs on and use hand more in functional tasks  Less than 1 lbs         OT Treatments/Exercises (OP) - 12/30/17 0001      RUE Contrast Bath   Time  11 minutes    Comments  at [facility] with forearm in sup stretch 2nd and 3rd cyclle of heat  During contrast done gentle stretch for supination - prolonged stretch  Pain less than 1-2/10    Soft tissue mobs done to webspace and joint mobst to MC's of R hand Graston tool nr 2 done sweeping over  Dorsal, volar forearm and volar hand and digits  Scar massage done    Wrist AAROM for UD , wrist extentiondone Prayer stretch and palms together for UD  15 reps  Wrist AROM in all planes - including sup /pro with elbow to side  Oppositionto all digits  Digits tendon glides And had pt pick up few objects different sizes - but under 1 lbs - with and without splint  Pt to use it in 10 act - and report back             OT Education - 12/30/17 1254    Education provided  Yes    Education Details  add UD and wrist extention AAROM and splint adjusted     Person(s) Educated  Patient    Methods  Explanation;Demonstration;Tactile cues;Verbal cues    Comprehension  Returned demonstration;Verbalized understanding       OT Short Term Goals - 12/21/17 1839      OT SHORT TERM GOAL #1   Title  Pt R hand digits able to make full fist and touch  palm , and extend digits to reach with hand in pocket     Baseline  see flowsheet - limited in flexion and extnetion - pain 8/10 with flexion of digits     Time  2    Period  Weeks    Status  New    Target Date  01/04/18      OT SHORT TERM GOAL #2   Title   R wrist AROM improve for pt to maintain midline to use hand in ADL's with more ease     Baseline  pt's wrist in RD, and flexion at rest- contracted - able to get to midline with effort but not able to maintain     Time  1    Period  Weeks    Status  New    Target Date  12/28/17      OT SHORT TERM GOAL #3   Title  Pain on PRWHE improve in R hand and wrist by more than 15 points     Baseline  at eval pain on PRHWE is 34/50     Time  3    Period  Weeks    Status  New    Target Date  01/11/18        OT Long Term Goals - 12/21/17 1845      OT LONG TERM GOAL #1   Title  R wrist AROM improve with more than 10-30 degrees in all planes to use R hand in more than 50% of ADL's     Baseline  see flowsheet for ROM , and not using hand in any tasks     Time  5    Period  Weeks    Status  New    Target Date  01/25/18      OT LONG TERM GOAL #2   Title  R grip strength increase to more than 50% compare to L hand to carry and hold objects at home and school     Baseline  NT - 4 wks s/p     Time  6    Period  Weeks    Status  New  Target Date  02/01/18      OT LONG TERM GOAL #3   Title  Function score on PRWHE imprrove with more than 25 points     Baseline  Function score at eval 47/50     Time  6    Period  Weeks    Status  New    Target Date  02/01/18            Plan - 12/30/17 1354    Clinical Impression Statement  Pt cont to show increase AROM - but supination tight and painfull - and UD still decrease - pt using 2 point grip in functional task - done and recommend pt to use full grasp more at home - for act less than 1 lbs - and pain should be less than 2/10 -     Occupational performance deficits (Please refer to  evaluation for details):  ADL's;IADL's;Work;Play;Leisure;Social Participation    Rehab Potential  Good    Current Impairments/barriers affecting progress:  Positive indicators: family support, motivation, age Negative indicators: multiple comorbidities    OT Frequency  -- 2-3 x wk     OT Duration  6 weeks    OT Treatment/Interventions  Self-care/ADL training;Therapeutic exercise;Moist Heat;Splinting;Patient/family education;Scar mobilization;Fluidtherapy;Cryotherapy;Contrast Bath;Manual Therapy;Passive range of motion    Plan  cont to focus on supination , wrist UD , extention and functional use - check if report increase use     Clinical Decision Making  Multiple treatment options, significant modification of task necessary    OT Home Exercise Plan  see pt instruction     Consulted and Agree with Plan of Care  Patient       Patient will benefit from skilled therapeutic intervention in order to improve the following deficits and impairments:  Pain, Impaired flexibility, Increased edema, Decreased coordination, Impaired sensation, Decreased scar mobility, Decreased strength, Decreased range of motion, Impaired UE functional use, Decreased knowledge of precautions  Visit Diagnosis: Pain in right hand  Pain in right wrist  Muscle weakness (generalized)  Stiffness of right wrist, not elsewhere classified  Stiffness of right hand, not elsewhere classified    Problem List Patient Active Problem List   Diagnosis Date Noted  . TIA (transient ischemic attack) 10/12/2015  . Pregnancy, supervision of, high-risk 06/27/2011  . Lupus 06/27/2011  . 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 Vaginal bleeding in pregnancy 06/27/2011  . Vaginal lesion 06/27/2011    Linda Vargas,Linda Vargas 12/30/2017, 1:58 PM  [place] [facility] [address] [place], [address] Phone: [phone]   Fax:  [phone]  Name: Linda Vargas MRN: [id number] Date of Birth: 1975/12/29

## 2018-01-01 ENCOUNTER — Ambulatory Visit: Payer: BC Managed Care – PPO | Admitting: Occupational Therapy

## 2018-01-01 DIAGNOSIS — M25531 Pain in right wrist: Secondary | ICD-10-CM

## 2018-01-01 DIAGNOSIS — M25641 Stiffness of right hand, not elsewhere classified: Secondary | ICD-10-CM | POA: Diagnosis not present

## 2018-01-01 DIAGNOSIS — M6281 Muscle weakness (generalized): Secondary | ICD-10-CM

## 2018-01-01 DIAGNOSIS — M25631 Stiffness of right wrist, not elsewhere classified: Secondary | ICD-10-CM

## 2018-01-01 DIAGNOSIS — M79641 Pain in right hand: Secondary | ICD-10-CM

## 2018-01-01 NOTE — Therapy (Signed)
[place] [facility] [address] [place], [address] Phone: [phone]   Fax:  [phone]  Occupational Therapy Treatment  Patient Details  Name: Linda Vargas MRN: [id number] Date of Birth: [birth year] Referring Provider: Arvella Nigh    Encounter Date: 01/01/2018  OT End of Session - 01/01/18 1408    Visit Number  5    Number of Visits  16    Date for OT Re-Evaluation  02/01/18    OT Start Time  1230    OT Stop Time  1313    OT Time Calculation (min)  43 min    Activity Tolerance  Patient tolerated treatment well    Behavior During Therapy  [facility] for tasks assessed/performed       Past Medical History:  Diagnosis Date  . Anxiety   . Connective tissue disorder (HCC)    undifferentiated  . Fibromyalgia   . Hypertension   . Lupus   . Migraine   . Seasonal allergic rhinitis     Past Surgical History:  Procedure Laterality Date  . CHOLECYSTECTOMY  2012  . DILATION AND CURETTAGE OF UTERUS  2011  . DILATION AND CURETTAGE OF UTERUS  01/17/2012   Procedure: DILATATION AND CURETTAGE;  Surgeon: Marylynn Pearson, MD;  Location: [facility];  Service: Gynecology;  Laterality: N/A;  Dilatation and curratage, insertion of Bakri Balloon  . FOOT SURGERY    . OPEN REDUCTION INTERNAL FIXATION (ORIF) DISTAL RADIAL FRACTURE Right 11/26/2017   Procedure: OPEN REDUCTION INTERNAL FIXATION (ORIF) DISTAL RADIAL FRACTURE;  Surgeon: Hessie Knows, MD;  Location: [facility];  Service: Orthopedics;  Laterality: Right;  . TONSILLECTOMY      There were no vitals filed for this visit.  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  Yes    Pain Score  2     Pain Location  Wrist    Pain Orientation  Right    Pain Descriptors / Indicators  Aching    Pain Type  Surgical pain                   OT Treatments/Exercises (OP) - 01/01/18 0001      RUE Contrast Bath   Time  11 minutes    Comments  -- SOC to increase ROM for sup stretch in heat , decrease edema       During contrast done gentle stretch for supination - prolonged stretch  Pain less than 1-2/10    Soft tissue mobs done to webspace and joint mobst to MC's of R hand Graston tool nr 2 done sweeping over  Dorsal, volar forearm and volar hand and digits  Scar massage done    Wrist AAROM for UD , wrist extentiondone Prayer stretch and palms together for UD  15 reps   review again - needed min A for elbow position  Wrist AROM in all planes - including sup /pro with  elbow to side And UD over armrest of chair  Oppositionto all digits  Digits tendon glides Composite fist - with placing and hold wrist neutral - 10 reps  Got better towards the end  Cont to use hand functionally        OT Education - 01/01/18 1407    Education provided  Yes    Education Details  add composite fist with wrist maintain neutral     Person(s) Educated  Patient    Methods  Explanation;Demonstration;Tactile cues;Verbal cues    Comprehension  Verbalized understanding;Returned demonstration       OT Short Term Goals - 12/21/17 1839      OT SHORT TERM GOAL #1   Title  Pt R hand digits able to make full fist and touch palm , and extend digits to reach with hand in pocket     Baseline  see flowsheet - limited in flexion and extnetion - pain 8/10 with flexion of digits     Time  2    Period  Weeks    Status  New    Target Date  01/04/18      OT SHORT TERM GOAL #2   Title   R wrist AROM improve for pt to maintain midline to use hand in ADL's with more ease      Baseline  pt's wrist in RD, and flexion at rest- contracted - able to get to midline with effort but not able to maintain     Time  1    Period  Weeks    Status  New    Target Date  12/28/17      OT SHORT TERM GOAL #3   Title  Pain on PRWHE improve in R hand and wrist by more than 15 points     Baseline  at eval pain on PRHWE is 34/50     Time  3    Period  Weeks    Status  New    Target Date  01/11/18        OT Long Term Goals - 12/21/17 1845      OT LONG TERM GOAL #1   Title  R wrist AROM improve with more than 10-30 degrees in all planes to use R hand in more than 50% of ADL's     Baseline  see flowsheet for ROM , and not using hand in any tasks     Time  5    Period  Weeks    Status  New    Target Date  01/25/18      OT LONG TERM GOAL #2   Title  R grip strength increase to more than 50% compare to L hand to carry and hold objects at home and school     Baseline  NT - 4 wks s/p     Time  6    Period  Weeks    Status  New    Target Date  02/01/18      OT LONG TERM GOAL #3   Title  Function score on PRWHE imprrove with more than 25 points     Baseline  Function score at eval 47/50     Time  6    Period  Weeks    Status  New    Target Date  02/01/18            Plan - 01/01/18 1409    Clinical Impression Statement  Pt making slow but  steady progress except supination tight and painfull - and ext/ulnar Deviation impaired - pt able to maintain wrist neutral better after fabrication of custome wrist splint -and  report increasing using it iin task     Occupational performance deficits (Please refer to evaluation for details):  ADL's;IADL's;Work;Play;Leisure;Social Participation    Rehab Potential  Good    OT Frequency  -- 2-3 x wk    OT Duration  6 weeks    OT Treatment/Interventions  Self-care/ADL training;Therapeutic exercise;Moist Heat;Splinting;Patient/family education;Scar mobilization;Fluidtherapy;Cryotherapy;Contrast Bath;Manual Therapy;Passive range of  motion    Plan  cont to focus on supination , wrist UD , extention and functional use - and composite grip with wrist in neutral maintain     OT Home Exercise Plan  see pt instruction     Consulted and Agree with Plan of Care  Patient       Patient will benefit from skilled therapeutic intervention in order to improve the following deficits and impairments:  Pain, Impaired flexibility, Increased edema, Decreased coordination, Impaired sensation, Decreased scar mobility, Decreased strength, Decreased range of motion, Impaired UE functional use, Decreased knowledge of precautions  Visit Diagnosis: Pain in right hand  Pain in right wrist  Muscle weakness (generalized)  Stiffness of right wrist, not elsewhere classified  Stiffness of right hand, not elsewhere classified    Problem List Patient Active Problem List   Diagnosis Date Noted  . TIA (transient ischemic attack) 10/12/2015  . Pregnancy, supervision of, high-risk 06/27/2011  . Lupus 06/27/2011  . 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 Vaginal bleeding in pregnancy 06/27/2011  . Vaginal lesion 06/27/2011    Rosalyn Gess OTR/L,CLT 01/01/2018, 2:12 PM  [place] [facility] [address], [place], [address] Phone: [phone]   Fax:  [phone]  Name: Linda Vargas MRN: [id number] Date of Birth: 1976-05-03

## 2018-01-01 NOTE — Patient Instructions (Signed)
Same - but work on making composite fist - and keep wrist neutral  And try and do splint at home 2 hrs off and on  Alternate

## 2018-01-05 ENCOUNTER — Ambulatory Visit: Payer: BC Managed Care – PPO | Admitting: Occupational Therapy

## 2018-01-05 DIAGNOSIS — M6281 Muscle weakness (generalized): Secondary | ICD-10-CM

## 2018-01-05 DIAGNOSIS — M25641 Stiffness of right hand, not elsewhere classified: Secondary | ICD-10-CM | POA: Diagnosis not present

## 2018-01-05 DIAGNOSIS — M25531 Pain in right wrist: Secondary | ICD-10-CM

## 2018-01-05 DIAGNOSIS — M25631 Stiffness of right wrist, not elsewhere classified: Secondary | ICD-10-CM

## 2018-01-05 DIAGNOSIS — M79641 Pain in right hand: Secondary | ICD-10-CM

## 2018-01-05 NOTE — Therapy (Signed)
[facility] [address] [place], [address] Phone: [phone]   Fax:  [phone]  Occupational Therapy Treatment  Patient Details  Name: Linda Vargas MRN: [id number] Date of Birth: 31-Oct-1975 Referring Provider: Arvella Nigh    Encounter Date: 01/05/2018  OT End of Session - 01/05/18 1650    Visit Number  6    Number of Visits  16    Date for OT Re-Evaluation  02/01/18    OT Start Time  1430    OT Stop Time  1520    OT Time Calculation (min)  50 min    Activity Tolerance  Patient tolerated treatment well    Behavior During Therapy  [facility] for tasks assessed/performed       Past Medical History:  Diagnosis Date  . Anxiety   . Connective tissue disorder (HCC)    undifferentiated  . Fibromyalgia   . Hypertension   . Lupus   . Migraine   . Seasonal allergic rhinitis     Past Surgical History:  Procedure Laterality Date  . CHOLECYSTECTOMY  2012  . DILATION AND CURETTAGE OF UTERUS  2011  . DILATION AND CURETTAGE OF UTERUS  01/17/2012   Procedure: DILATATION AND CURETTAGE;  Surgeon: Marylynn Pearson, MD;  Location: [facility];  Service: Gynecology;  Laterality: N/A;  Dilatation and curratage, insertion of Bakri Balloon  . FOOT SURGERY    . OPEN REDUCTION INTERNAL FIXATION (ORIF) DISTAL RADIAL FRACTURE Right 11/26/2017   Procedure: OPEN REDUCTION INTERNAL FIXATION (ORIF) DISTAL RADIAL FRACTURE;  Surgeon: Hessie Knows, MD;  Location: [facility];  Service: Orthopedics;  Laterality: Right;  . TONSILLECTOMY      There were no vitals filed for this visit.  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  Yes    Pain Score  4     Pain Location  Wrist    Pain Orientation  Right    Pain Descriptors / Indicators  Aching    Pain Type  Surgical pain         [facility] OT Assessment - 01/05/18 0001      AROM   Right Wrist Extension  32 Degrees    Right Wrist Flexion  50 Degrees    Right Wrist Radial Deviation  25 Degrees    Right Wrist Ulnar Deviation  8 Degrees       Supination 50 degrees        OT Treatments/Exercises (OP) - 01/05/18 0001      RUE Contrast Bath   Time  11 minutes    Comments  AT [facility] with supination attempt with 2 last cycle of heat        During contrast done gentle stretch forsupination - prolonged stretch  Pain less than 1-2/10 but if further attempt - pain increase   Soft tissue mobs done to webspace and joint mobst to MC's of R hand Graston tool nr 2 done sweeping over Dorsal,volar forearm and volar hand and digits  Scar massagedonethis date    Wrist  AAROM for UD , wrist extention over edge of table - pt ed and provided for HEP  Prayer stretch and palms together for UD  15 repsto cont with - because of digits not full extended pt report having hard time   review again - needed min A for elbow position   BTE CPM initiated this date - 2 x of 200 sec for wrist extention  And measure 35 degrees wrist extention  Able to  Composite fist - with placing and hold wrist neutral - 10 reps  Got better towards the end  Cont to use hand functionally         OT Education - 01/05/18 1649    Education provided  Yes    Education Details  add AAROM for wrist ext, UD     Person(s) Educated  Patient    Methods  Explanation;Demonstration    Comprehension  Verbalized understanding       OT Short Term Goals - 12/21/17 1839      OT SHORT TERM GOAL #1   Title  Pt R hand digits able to make full fist and touch palm , and extend digits to reach with hand in pocket     Baseline  see flowsheet - limited in flexion and extnetion - pain 8/10  with flexion of digits     Time  2    Period  Weeks    Status  New    Target Date  01/04/18      OT SHORT TERM GOAL #2   Title   R wrist AROM improve for pt to maintain midline to use hand in ADL's with more ease     Baseline  pt's wrist in RD, and flexion at rest- contracted - able to get to midline with effort but not able to maintain     Time  1    Period  Weeks    Status  New    Target Date  12/28/17      OT SHORT TERM GOAL #3   Title  Pain on PRWHE improve in R hand and wrist by more than 15 points     Baseline  at eval pain on PRHWE is 34/50     Time  3    Period  Weeks    Status  New    Target Date  01/11/18        OT Long Term Goals - 12/21/17 1845      OT LONG TERM GOAL #1   Title  R wrist AROM improve with more than 10-30 degrees in all planes to use R hand in more than 50% of ADL's     Baseline  see flowsheet for ROM , and not using hand in any tasks     Time  5    Period  Weeks    Status  New    Target Date  01/25/18      OT LONG TERM GOAL #2   Title  R grip strength increase to more than 50% compare to L hand to carry and hold objects at home and school     Baseline  NT - 4 wks s/p     Time  6    Period  Weeks    Status  New    Target Date  02/01/18      OT LONG TERM GOAL #3   Title  Function score on PRWHE imprrove with more than 25 points  Baseline  Function score at eval 47/50     Time  6    Period  Weeks    Status  New    Target Date  02/01/18            Plan - 01/05/18 1650    Clinical Impression Statement  Pt making progress in digits AROM , wrist flexion and RD - But tight and pain with supination  - did get some increase extention this date doing CPM on BTE - and add AAROM for UD and wrist extnetion being she is this week 6 wks s/p     Occupational performance deficits (Please refer to evaluation for details):  ADL's;IADL's;Work;Play;Leisure;Social Participation    Rehab Potential  Good    OT Frequency  -- 2-3 wks    OT Duration   6 weeks    OT Treatment/Interventions  Self-care/ADL training;Therapeutic exercise;Moist Heat;Splinting;Patient/family education;Scar mobilization;Fluidtherapy;Cryotherapy;Contrast Bath;Manual Therapy;Passive range of motion    Plan  cont to focus on supination , wrist UD , extention and functional use - and composite grip with wrist in neutral maintain     Clinical Decision Making  Multiple treatment options, significant modification of task necessary    OT Home Exercise Plan  see pt instruction     Consulted and Agree with Plan of Care  Patient       Patient will benefit from skilled therapeutic intervention in order to improve the following deficits and impairments:  Pain, Impaired flexibility, Increased edema, Decreased coordination, Impaired sensation, Decreased scar mobility, Decreased strength, Decreased range of motion, Impaired UE functional use, Decreased knowledge of precautions  Visit Diagnosis: Pain in right hand  Pain in right wrist  Muscle weakness (generalized)  Stiffness of right wrist, not elsewhere classified  Stiffness of right hand, not elsewhere classified    Problem List Patient Active Problem List   Diagnosis Date Noted  . TIA (transient ischemic attack) 10/12/2015  . Pregnancy, supervision of, high-risk 06/27/2011  . Lupus 06/27/2011  . 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 Vaginal bleeding in pregnancy 06/27/2011  . Vaginal lesion 06/27/2011    Rosalyn Gess OTR/L,CLT 01/05/2018, 4:53 PM  [facility] [address] [place], [address] Phone: [phone]   Fax:  [phone]  Name: Linda Vargas MRN: [id number] Date of Birth: March 28, 1976

## 2018-01-05 NOTE — Patient Instructions (Signed)
Same HEP - but add AAROM for wrist extention and UD 10 reps prior to AROM

## 2018-01-06 ENCOUNTER — Ambulatory Visit: Payer: BC Managed Care – PPO | Admitting: Occupational Therapy

## 2018-01-06 DIAGNOSIS — M25631 Stiffness of right wrist, not elsewhere classified: Secondary | ICD-10-CM

## 2018-01-06 DIAGNOSIS — M25641 Stiffness of right hand, not elsewhere classified: Secondary | ICD-10-CM | POA: Diagnosis not present

## 2018-01-06 DIAGNOSIS — M79641 Pain in right hand: Secondary | ICD-10-CM

## 2018-01-06 DIAGNOSIS — M25531 Pain in right wrist: Secondary | ICD-10-CM

## 2018-01-06 DIAGNOSIS — M6281 Muscle weakness (generalized): Secondary | ICD-10-CM

## 2018-01-06 NOTE — Therapy (Signed)
[facility] [address] [place], [address] Phone: [phone]   Fax:  [phone]  Occupational Therapy Treatment  Patient Details  Name: Linda Vargas MRN: [id number] Date of Birth: June 26, 1976 Referring Provider: Arvella Nigh    Encounter Date: 01/06/2018  OT End of Session - 01/06/18 0951    Visit Number  7    Number of Visits  16    Date for OT Re-Evaluation  02/01/18    OT Start Time  0920    OT Stop Time  0959    OT Time Calculation (min)  39 min    Activity Tolerance  Patient tolerated treatment well    Behavior During Therapy  [facility] for tasks assessed/performed       Past Medical History:  Diagnosis Date  . Anxiety   . Connective tissue disorder (HCC)    undifferentiated  . Fibromyalgia   . Hypertension   . Lupus   . Migraine   . Seasonal allergic rhinitis     Past Surgical History:  Procedure Laterality Date  . CHOLECYSTECTOMY  2012  . DILATION AND CURETTAGE OF UTERUS  2011  . DILATION AND CURETTAGE OF UTERUS  01/17/2012   Procedure: DILATATION AND CURETTAGE;  Surgeon: Marylynn Pearson, MD;  Location: [facility];  Service: Gynecology;  Laterality: N/A;  Dilatation and curratage, insertion of Bakri Balloon  . FOOT SURGERY    . OPEN REDUCTION INTERNAL FIXATION (ORIF) DISTAL RADIAL FRACTURE Right 11/26/2017   Procedure: OPEN REDUCTION INTERNAL FIXATION (ORIF) DISTAL RADIAL FRACTURE;  Surgeon: Hessie Knows, MD;  Location: [facility];  Service: Orthopedics;  Laterality: Right;  . TONSILLECTOMY      There were no vitals filed for this visit.  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  Yes    Pain Score  5      Pain Location  Wrist    Pain Orientation  Right    Pain Descriptors / Indicators  Aching    Pain Type  Surgical pain    Pain Onset  More than a month ago                   OT Treatments/Exercises (OP) - 01/06/18 0001      RUE Paraffin   Number Minutes Paraffin  10 Minutes    RUE Paraffin Location  Hand wrist     Comments  at [facility] to increase ROM and decrease pain and scar tissue         During paraffin gentle stretch forsupination - prolonged stretch  Pain less than 1-2/10 but if further attempt - pain increase   Soft tissue mobs done to webspace and joint mobst to MC's of R hand Graston tool nr 2 done sweeping over Dorsal,volar forearm and volar hand and digits  And vibration Scar massagedonethis date    Wrist AAROM for UD , wrist extention over edge of table - pt ed and provided for HEP neeed min A for correct position    Prayer stretch and palms together for UD  To cont  15 repsto cont  with - because of digits not full extended pt report having hard time  review again - needed min A for elbow position  BTE CPM  - 2 x of 200 sec for wrist extention  AROM for wrist extention from neutral - open hand and atttempted hand in fist  And measure 37 degrees wrist extention   Composite fist - with placing and hold wrist neutral - 10 reps  Got better towards the end  Cont to use hand functionally       OT Education - 01/06/18 0950    Education provided  Yes    Education Details  review again AAROM wrist ext and UD     Person(s) Educated  Patient    Methods  Explanation;Demonstration;Tactile cues    Comprehension  Verbalized understanding;Returned demonstration       OT Short Term Goals - 12/21/17 1839      OT SHORT TERM GOAL #1   Title  Pt R hand digits able to make full fist and touch palm , and extend digits to reach with hand in pocket     Baseline  see flowsheet - limited in flexion and extnetion - pain 8/10 with flexion of digits      Time  2    Period  Weeks    Status  New    Target Date  01/04/18      OT SHORT TERM GOAL #2   Title   R wrist AROM improve for pt to maintain midline to use hand in ADL's with more ease     Baseline  pt's wrist in RD, and flexion at rest- contracted - able to get to midline with effort but not able to maintain     Time  1    Period  Weeks    Status  New    Target Date  12/28/17      OT SHORT TERM GOAL #3   Title  Pain on PRWHE improve in R hand and wrist by more than 15 points     Baseline  at eval pain on PRHWE is 34/50     Time  3    Period  Weeks    Status  New    Target Date  01/11/18        OT Long Term Goals - 12/21/17 1845      OT LONG TERM GOAL #1   Title  R wrist AROM improve with more than 10-30 degrees in all planes to use R hand in more than 50% of ADL's     Baseline  see flowsheet for ROM , and not using hand in any tasks     Time  5    Period  Weeks    Status  New    Target Date  01/25/18      OT LONG TERM GOAL #2   Title  R grip strength increase to more than 50% compare to L hand to carry and hold objects at home and school     Baseline  NT - 4 wks s/p     Time  6    Period  Weeks    Status  New    Target Date  02/01/18      OT LONG TERM GOAL #3   Title  Function score on PRWHE imprrove with more than 25 points     Baseline  Function score at eval 47/50     Time  6    Period  Weeks    Status  New    Target Date  02/01/18            Plan - 01/06/18 0951    Clinical Impression Statement  Able to start AAROM on BTE CPM and HEP this date - focussing on UD, wrist ext and supination - pt to see surgeon Friday - still using long extensors for wrist extention- digits extention improving  - scar still adhere - proximal more than distal     Occupational performance deficits (Please refer to evaluation for details):  ADL's;IADL's;Work;Play;Leisure;Social Participation    Rehab Potential  Good    Current Impairments/barriers affecting progress:   Positive indicators: family support, motivation, age Negative indicators: multiple comorbidities    OT Frequency  -- 2-3x wk    OT Duration  6 weeks    OT Treatment/Interventions  Self-care/ADL training;Therapeutic exercise;Moist Heat;Splinting;Patient/family education;Scar mobilization;Fluidtherapy;Cryotherapy;Contrast Bath;Manual Therapy;Passive range of motion    Plan  cont to focus on supination , wrist UD , extention AAROM - and wrist extnetion with hand in fist     Clinical Decision Making  Multiple treatment options, significant modification of task necessary    OT Home Exercise Plan  see pt instruction     Consulted and Agree with Plan of Care  Patient       Patient will benefit from skilled therapeutic intervention in order to improve the following deficits and impairments:  Pain, Impaired flexibility, Increased edema, Decreased coordination, Impaired sensation, Decreased scar mobility, Decreased strength, Decreased range of motion, Impaired UE functional use, Decreased knowledge of precautions  Visit Diagnosis: Pain in right hand  Pain in right wrist  Muscle weakness (generalized)  Stiffness of right wrist, not elsewhere classified  Stiffness of right hand, not elsewhere classified    Problem List Patient Active Problem List   Diagnosis Date Noted  . TIA (transient ischemic attack) 10/12/2015  . Pregnancy, supervision of, high-risk 06/27/2011  . Lupus 06/27/2011  . 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 Vaginal bleeding in pregnancy 06/27/2011  . Vaginal lesion 06/27/2011    Rosalyn Gess OTR/L,CLT 01/06/2018, 10:47 AM  [facility] [address], [place], [address] Phone: [phone]   Fax:  [phone]  Name: Linda Vargas MRN: [id number] Date of Birth: 06-06-1976

## 2018-01-06 NOTE — Patient Instructions (Signed)
Same as last time  

## 2018-01-08 ENCOUNTER — Ambulatory Visit: Payer: BC Managed Care – PPO | Admitting: Occupational Therapy

## 2018-01-08 DIAGNOSIS — M25531 Pain in right wrist: Secondary | ICD-10-CM

## 2018-01-08 DIAGNOSIS — M25641 Stiffness of right hand, not elsewhere classified: Secondary | ICD-10-CM

## 2018-01-08 DIAGNOSIS — M25631 Stiffness of right wrist, not elsewhere classified: Secondary | ICD-10-CM

## 2018-01-08 DIAGNOSIS — M79641 Pain in right hand: Secondary | ICD-10-CM

## 2018-01-08 DIAGNOSIS — M6281 Muscle weakness (generalized): Secondary | ICD-10-CM

## 2018-01-08 NOTE — Patient Instructions (Signed)
Same for PROM and AAROM   but add 1 lbs weight for wrist ext place and hold UD over edge of table  12 reps  Each  Sup holding weight by hand and forearm supported - and pronation  12 reps  PUtty teal rolling , pinching 3 points- 2 x thru  And pulling with all digits  And twisting turning doorknob - using all digits and sup/pro  12 rpes

## 2018-01-08 NOTE — Therapy (Signed)
[facility] [address] [place], [address] Phone: [phone]   Fax:  [phone]  Occupational Therapy Treatment  Patient Details  Name: Linda Vargas MRN: [id number] Date of Birth: August 25, 1976 Referring Provider: Arvella Nigh    Encounter Date: 01/08/2018  OT End of Session - 01/08/18 1923    Visit Number  8    Number of Visits  16    Date for OT Re-Evaluation  02/01/18    OT Start Time  1115    OT Stop Time  1212    OT Time Calculation (min)  57 min    Activity Tolerance  Patient tolerated treatment well    Behavior During Therapy  [facility] for tasks assessed/performed       Past Medical History:  Diagnosis Date  . Anxiety   . Connective tissue disorder (HCC)    undifferentiated  . Fibromyalgia   . Hypertension   . Lupus   . Migraine   . Seasonal allergic rhinitis     Past Surgical History:  Procedure Laterality Date  . CHOLECYSTECTOMY  2012  . DILATION AND CURETTAGE OF UTERUS  2011  . DILATION AND CURETTAGE OF UTERUS  01/17/2012   Procedure: DILATATION AND CURETTAGE;  Surgeon: Marylynn Pearson, MD;  Location: [facility];  Service: Gynecology;  Laterality: N/A;  Dilatation and curratage, insertion of Bakri Balloon  . FOOT SURGERY    . OPEN REDUCTION INTERNAL FIXATION (ORIF) DISTAL RADIAL FRACTURE Right 11/26/2017   Procedure: OPEN REDUCTION INTERNAL FIXATION (ORIF) DISTAL RADIAL FRACTURE;  Surgeon: Hessie Knows, MD;  Location: [facility];  Service: Orthopedics;  Laterality: Right;  . TONSILLECTOMY      There were no vitals filed for this visit.  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  Yes    Pain Location  Wrist    Pain Orientation  Right    Pain Descriptors / Indicators  Aching    Pain Type  Surgical pain    Pain Onset  More than a month ago    Pain Frequency  Intermittent                   OT Treatments/Exercises (OP) - 01/08/18 0001      RUE Paraffin   Number Minutes Paraffin  10 Minutes    RUE Paraffin Location  Hand    Comments  at [facility] to increase supination in stretch with heaitingpad        During paraffin gentle stretch forsupination - prolonged stretch  Pain less than 1-2/10 but if further attempt - pain increase   Soft tissue mobs done to webspace and joint mobst to MC's of R hand Graston tool nr 2 done sweeping over Dorsal,volar forearm and volar hand and digits  And vibration Scar massagedonethis date   Wrist AAROM for UD  Prior to 1 lbs weight for armrest for  UD and RD  12 reps   no pain  Did had burning pain still with UD stretch on radial side  BTE CPM  - 2 x of 200 sec for wrist extention  1 lbs for wrist extention - place and hold 2 x 8 reps   AROM for wrist extention from neutral - open hand and atttempted hand in fist  And measure 37 degrees wrist extention   1 lbs for sup, pronation but holding end of weight and support forearm  12 reps  Sup able to do 50 -55 degrees  Composite fist - with placing and hold wrist neutral - 10 reps  Got better towards the end  Putty teal - only rolling and then 3 point pinch done   2 x thru  And pulling with all digits   attempted twisting but unable because of wrist   but pt to do doorknob - sup and pron - and using all digits  At home 12 reps   pain free        OT Education - 01/08/18 1922    Education provided  Yes    Education Details  Add 1 lbs weight and more putty     Person(s) Educated  Patient    Methods  Explanation;Demonstration;Tactile  cues;Verbal cues;Handout    Comprehension  Verbalized understanding       OT Short Term Goals - 12/21/17 1839      OT SHORT TERM GOAL #1   Title  Pt R hand digits able to make full fist and touch palm , and extend digits to reach with hand in pocket     Baseline  see flowsheet - limited in flexion and extnetion - pain 8/10 with flexion of digits     Time  2    Period  Weeks    Status  New    Target Date  01/04/18      OT SHORT TERM GOAL #2   Title   R wrist AROM improve for pt to maintain midline to use hand in ADL's with more ease     Baseline  pt's wrist in RD, and flexion at rest- contracted - able to get to midline with effort but not able to maintain     Time  1    Period  Weeks    Status  New    Target Date  12/28/17      OT SHORT TERM GOAL #3   Title  Pain on PRWHE improve in R hand and wrist by more than 15 points     Baseline  at eval pain on PRHWE is 34/50     Time  3    Period  Weeks    Status  New    Target Date  01/11/18        OT Long Term Goals - 12/21/17 1845      OT LONG TERM GOAL #1   Title  R wrist AROM improve with more than 10-30 degrees in all planes to use R hand in more than 50% of ADL's     Baseline  see flowsheet for ROM , and not using hand in any tasks     Time  5    Period  Weeks    Status  New    Target Date  01/25/18      OT LONG TERM GOAL #2   Title  R grip strength increase to more than 50% compare to L hand to  carry and hold objects at home and school     Baseline  NT - 4 wks s/p     Time  6    Period  Weeks    Status  New    Target Date  02/01/18      OT LONG TERM GOAL #3   Title  Function score on PRWHE imprrove with more than 25 points     Baseline  Function score at eval 47/50     Time  6    Period  Weeks    Status  New    Target Date  02/01/18            Plan - 01/08/18 1923    Clinical Impression Statement  Pt seen surgeon this am - will follow up in week - pt report scar tissue around hardware and that maybe  need hard ware removed in future - to see surgeon in month again - so out of work still for another month - feels like it is slowly improving - but feels better after surgeon explain to her too what he see - and it confirm what  OT said she see in therapy     Occupational performance deficits (Please refer to evaluation for details):  ADL's;IADL's;Work;Play;Leisure;Social Participation    Rehab Potential  Good    OT Frequency  -- 2-3 x wk     OT Duration  4 weeks    OT Treatment/Interventions  Self-care/ADL training;Therapeutic exercise;Moist Heat;Splinting;Patient/family education;Scar mobilization;Fluidtherapy;Cryotherapy;Contrast Bath;Manual Therapy;Passive range of motion    Plan  cont to focus on supination , wrist UD , extention AAROM - assess how doing with 1 lbs     Clinical Decision Making  Multiple treatment options, significant modification of task necessary    OT Home Exercise Plan  see pt instruction     Consulted and Agree with Plan of Care  Patient       Patient will benefit from skilled therapeutic intervention in order to improve the following deficits and impairments:  Pain, Impaired flexibility, Increased edema, Decreased coordination, Impaired sensation, Decreased scar mobility, Decreased strength, Decreased range of motion, Impaired UE functional use, Decreased knowledge of precautions  Visit Diagnosis: Pain in right hand  Pain in right wrist  Muscle weakness (generalized)  Stiffness of right wrist, not elsewhere classified  Stiffness of right hand, not elsewhere classified    Problem List Patient Active Problem List   Diagnosis Date Noted  . TIA (transient ischemic attack) 10/12/2015  . Pregnancy, supervision of, high-risk 06/27/2011  . Lupus 06/27/2011  . 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 Vaginal bleeding in pregnancy 06/27/2011  . Vaginal lesion 06/27/2011    Rosalyn Gess OTR/L,CLT 01/08/2018, 7:27 PM  [facility] [address] [place], [address] Phone: [phone]   Fax:  [phone]  Name: Linda Vargas MRN: [id number] Date of Birth: 1976/02/15

## 2018-01-11 ENCOUNTER — Ambulatory Visit: Payer: BC Managed Care – PPO | Admitting: Occupational Therapy

## 2018-01-13 ENCOUNTER — Ambulatory Visit: Payer: BC Managed Care – PPO | Attending: Orthopedic Surgery | Admitting: Occupational Therapy

## 2018-01-13 DIAGNOSIS — M79641 Pain in right hand: Secondary | ICD-10-CM | POA: Diagnosis present

## 2018-01-13 DIAGNOSIS — M6281 Muscle weakness (generalized): Secondary | ICD-10-CM | POA: Insufficient documentation

## 2018-01-13 DIAGNOSIS — M25631 Stiffness of right wrist, not elsewhere classified: Secondary | ICD-10-CM

## 2018-01-13 DIAGNOSIS — M25531 Pain in right wrist: Secondary | ICD-10-CM | POA: Diagnosis present

## 2018-01-13 DIAGNOSIS — M25641 Stiffness of right hand, not elsewhere classified: Secondary | ICD-10-CM | POA: Insufficient documentation

## 2018-01-13 NOTE — Patient Instructions (Signed)
Pt to do some table slides on towel from composite extention of wrist  15-20 reps   Work on UD twisting round object with hand - like end of at 2 lbs weight

## 2018-01-13 NOTE — Therapy (Signed)
[facility] [address], [place], [address] Phone: [phone]   Fax:  [phone]  Occupational Therapy Treatment  Patient Details  Name: Linda Vargas MRN: [id number] Date of Birth: 1976-01-18 Referring Provider: Arvella Nigh    Encounter Date: 01/13/2018  OT End of Session - 01/13/18 1412    Visit Number  9    Number of Visits  16    Date for OT Re-Evaluation  02/01/18    OT Start Time  1002    OT Stop Time  1104    OT Time Calculation (min)  62 min    Activity Tolerance  Patient tolerated treatment well    Behavior During Therapy  [facility] for tasks assessed/performed       Past Medical History:  Diagnosis Date  . Anxiety   . Connective tissue disorder (HCC)    undifferentiated  . Fibromyalgia   . Hypertension   . Lupus   . Migraine   . Seasonal allergic rhinitis     Past Surgical History:  Procedure Laterality Date  . CHOLECYSTECTOMY  2012  . DILATION AND CURETTAGE OF UTERUS  2011  . DILATION AND CURETTAGE OF UTERUS  01/17/2012   Procedure: DILATATION AND CURETTAGE;  Surgeon: Marylynn Pearson, MD;  Location: [facility];  Service: Gynecology;  Laterality: N/A;  Dilatation and curratage, insertion of Bakri Balloon  . FOOT SURGERY    . OPEN REDUCTION INTERNAL FIXATION (ORIF) DISTAL RADIAL FRACTURE Right 11/26/2017   Procedure: OPEN REDUCTION INTERNAL FIXATION (ORIF) DISTAL RADIAL FRACTURE;  Surgeon: Hessie Knows, MD;  Location: [facility];  Service: Orthopedics;  Laterality: Right;  . TONSILLECTOMY      There were no vitals filed for this visit.  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  Yes     Pain Score  1     Pain Location  Hand    Pain Orientation  Right    Pain Descriptors / Indicators  Aching;Sore    Pain Type  Surgical pain    Pain Onset  More than a month ago                   OT Treatments/Exercises (OP) - 01/13/18 0001      RUE Paraffin   Number Minutes Paraffin  10 Minutes    RUE Paraffin Location  Hand    Comments  At [facility] to increase ROM - held in partial sup stretch with heatingpad        Duringparaffingentle stretch forsupination - prolonged stretch  Pain less than 1-2/10 but if further attempt - pain increase   Soft tissue mobs done to webspace and joint mobst to MC's of R hand Graston tool nr 2 done sweeping over Dorsal,volar forearm and volar hand and digits And vibration Scar massagedonethis date   Wrist AAROM for UD   CPM for UD done 120 sec  Large knob for UD - 1 lbs - 100 sec  Did had burning pain still with UD stretch on radial side but decrease but  hand fatigue  Done RD 70 sec at 0 lbs   BTE CPM - 2 x of 200 sec for wrist extention  1 lbs for wrist extention - place and hold 10 reps   AROM for wrist extention from neutral - open hand and atttempted hand in fist And measure 37degrees wrist extention   CPM For D ring - for supination  200 sec   then 0 lbs for BTE D ring - supination - but barely getting past neutral  Add and done 2 x in clinic this date table slides for wrist extention on towel  15 reps  Easier 2nd time after paraffin  Add to HEP   Putty teal - only rolling and then 3 point pinch done   2 x thru  And pulling with all digits   attempted twisting but unable because of wrist   but pt to do doorknob - sup and pron - and using all digits  At home 12 reps   pain free         OT Education - 01/13/18 1411    Education provided  Yes    Education Details  HEP and add table slides     Person(s) Educated  Patient    Methods  Explanation;Demonstration;Tactile cues     Comprehension  Returned demonstration;Verbalized understanding       OT Short Term Goals - 12/21/17 1839      OT SHORT TERM GOAL #1   Title  Pt R hand digits able to make full fist and touch palm , and extend digits to reach with hand in pocket     Baseline  see flowsheet - limited in flexion and extnetion - pain 8/10 with flexion of digits     Time  2    Period  Weeks    Status  New    Target Date  01/04/18      OT SHORT TERM GOAL #2   Title   R wrist AROM improve for pt to maintain midline to use hand in ADL's with more ease     Baseline  pt's wrist in RD, and flexion at rest- contracted - able to get to midline with effort but not able to maintain     Time  1    Period  Weeks    Status  New    Target Date  12/28/17      OT SHORT TERM GOAL #3   Title  Pain on PRWHE improve in R hand and wrist by more than 15 points     Baseline  at eval pain on PRHWE is 34/50     Time  3    Period  Weeks    Status  New    Target Date  01/11/18        OT Long Term Goals - 12/21/17 1845      OT LONG TERM GOAL #1   Title  R wrist AROM improve with more than 10-30 degrees in all planes to use R hand in more than 50% of ADL's     Baseline  see flowsheet for ROM , and not using hand in any tasks     Time  5    Period  Weeks    Status  New    Target Date  01/25/18      OT LONG TERM GOAL #2   Title  R grip strength increase to more than 50% compare to L hand to carry and  hold objects at home and school     Baseline  NT - 4 wks s/p     Time  6    Period  Weeks    Status  New    Target Date  02/01/18      OT LONG TERM GOAL #3   Title  Function score on PRWHE imprrove with more than 25 points     Baseline  Function score at eval 47/50     Time  6    Period  Weeks    Status  New    Target Date  02/01/18            Plan - 01/13/18 1412    Clinical Impression Statement  Pt cont to make slow but steady progress - to focus on composite extention of digits and wrist , and wrist UD  , ext and sup - done all 3 on BTE this date for CPM and strengthening      Occupational performance deficits (Please refer to evaluation for details):  ADL's;IADL's;Work;Play;Leisure;Social Participation    Rehab Potential  Good    Current Impairments/barriers affecting progress:  Positive indicators: family support, motivation, age Negative indicators: multiple comorbidities    OT Frequency  3x / week    OT Duration  4 weeks    OT Treatment/Interventions  Self-care/ADL training;Therapeutic exercise;Moist Heat;Splinting;Patient/family education;Scar mobilization;Fluidtherapy;Cryotherapy;Contrast Bath;Manual Therapy;Passive range of motion    Plan  cont to focus on supination , wrist UD , extention AAROM - increase strength     Clinical Decision Making  Multiple treatment options, significant modification of task necessary    OT Home Exercise Plan  see pt instruction     Consulted and Agree with Plan of Care  Patient       Patient will benefit from skilled therapeutic intervention in order to improve the following deficits and impairments:  Pain, Impaired flexibility, Increased edema, Decreased coordination, Impaired sensation, Decreased scar mobility, Decreased strength, Decreased range of motion, Impaired UE functional use, Decreased knowledge of precautions  Visit Diagnosis: Pain in right hand  Pain in right wrist  Muscle weakness (generalized)  Stiffness of right wrist, not elsewhere classified  Stiffness of right hand, not elsewhere classified    Problem List Patient Active Problem List   Diagnosis Date Noted  . TIA (transient ischemic attack) 10/12/2015  . Pregnancy, supervision of, high-risk 06/27/2011  . Lupus ([place]) 06/27/2011  . 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 Vaginal bleeding in pregnancy 06/27/2011  . Vaginal lesion 06/27/2011    Rosalyn Gess OTR/L,CLT 01/13/2018, 2:15 PM  [facility] [address] [place], [address] Phone: [phone]   Fax:  [phone]  Name: Linda Vargas MRN: [id number] Date of Birth: Mar 26, 1976

## 2018-01-15 ENCOUNTER — Ambulatory Visit: Payer: BC Managed Care – PPO | Admitting: Occupational Therapy

## 2018-01-15 DIAGNOSIS — M25631 Stiffness of right wrist, not elsewhere classified: Secondary | ICD-10-CM

## 2018-01-15 DIAGNOSIS — M79641 Pain in right hand: Secondary | ICD-10-CM | POA: Diagnosis not present

## 2018-01-15 DIAGNOSIS — M25531 Pain in right wrist: Secondary | ICD-10-CM

## 2018-01-15 DIAGNOSIS — M6281 Muscle weakness (generalized): Secondary | ICD-10-CM

## 2018-01-15 DIAGNOSIS — M25641 Stiffness of right hand, not elsewhere classified: Secondary | ICD-10-CM

## 2018-01-15 NOTE — Patient Instructions (Signed)
Cont with same PROM HEP  And digits  Splint on and off 2 hrs   2 lbs weight for UD and wrist ext ( place and hold)  10 reps 1 set  And 2nd set 1 lbs  Sup/pro hold bottom of 1 lbs weight 2 x 10 reps  pain free all of them  Cont with same putty but can add gripping if wrist neutral

## 2018-01-15 NOTE — Therapy (Signed)
[facility] [address], [place], [address] Phone: [phone]   Fax:  [phone]  Occupational Therapy Treatment  Patient Details  Name: Linda Vargas MRN: [id number] Date of Birth: January 13, 1976 Referring Provider: Arvella Nigh    Encounter Date: 01/15/2018  OT End of Session - 01/15/18 1318    Visit Number  10    Number of Visits  16    Date for OT Re-Evaluation  02/01/18    OT Start Time  1135    OT Stop Time  1233    OT Time Calculation (min)  58 min    Activity Tolerance  Patient tolerated treatment well    Behavior During Therapy  [facility] for tasks assessed/performed       Past Medical History:  Diagnosis Date  . Anxiety   . Connective tissue disorder (HCC)    undifferentiated  . Fibromyalgia   . Hypertension   . Lupus   . Migraine   . Seasonal allergic rhinitis     Past Surgical History:  Procedure Laterality Date  . CHOLECYSTECTOMY  2012  . DILATION AND CURETTAGE OF UTERUS  2011  . DILATION AND CURETTAGE OF UTERUS  01/17/2012   Procedure: DILATATION AND CURETTAGE;  Surgeon: Marylynn Pearson, MD;  Location: [facility];  Service: Gynecology;  Laterality: N/A;  Dilatation and curratage, insertion of Bakri Balloon  . FOOT SURGERY    . OPEN REDUCTION INTERNAL FIXATION (ORIF) DISTAL RADIAL FRACTURE Right 11/26/2017   Procedure: OPEN REDUCTION INTERNAL FIXATION (ORIF) DISTAL RADIAL FRACTURE;  Surgeon: Hessie Knows, MD;  Location: [facility];  Service: Orthopedics;  Laterality: Right;  . TONSILLECTOMY      There were no vitals filed for this visit.  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  No/denies                   OT Treatments/Exercises (OP) - 01/15/18 0001      RUE Paraffin   Number Minutes Paraffin  10 Minutes    RUE Paraffin Location  Hand wrist    Comments  at [facility] for sup stretch with heatingpad        Soft tissue mobs done to webspace and joint mobst to MC's of R hand Graston tool nr 2 done sweeping over Dorsal,volar forearm and volar hand and digits And vibration Scar massagedonethis date Digits had full extention after graston tool and PROM  Kinesiotape done for scar this date - pt ed on precautions  50% pull parallel to scar and across- 100 % for scar mobs  xtra provided to do over weekend    Wrist AAROM for UD CPM for UD done 120 sec  Large knob for UD - 1 lbs - 100 sec  Did had burning pain  with UD stretch on radial side but decrease but hand fatigue when done resistanc    UD done over computer table - 2 lbs for UD and midline RD - 10 reps  HEP to do and 2nd set 1  lbs keep wrist neutral - not into flexion   BTE CPM - 2 x of 200 sec for wrist extention 2 lbs for wrist extention - place and hold 10 reps    CPM For D ring - for supination  100 sec  This date but felt pop at ulnar side of wrist   DOne 1 lbs weight for sup and pronation - 10 reps - on and off popping but no pain   table slides for wrist extention on towel  Prior to CPM - pt doing at home  15 reps  Easier  after paraffin  BTE for gripper done 25 lbs 120 sec - with no pain and able to maintain wrist neutral   Putty teal -  rolling and then 3 point pinch done  2 x thru  Add gripping inbetween 10 reps   keeping wrist neutral         OT Education - 01/15/18 1318    Education provided  Yes    Education Details  HEP - add new ones    Person(s) Educated  Patient    Methods  Explanation;Demonstration;Tactile cues;Verbal cues    Comprehension  Returned demonstration;Verbalized understanding       OT Short Term Goals - 12/21/17 1839       OT SHORT TERM GOAL #1   Title  Pt R hand digits able to make full fist and touch palm , and extend digits to reach with hand in pocket     Baseline  see flowsheet - limited in flexion and extnetion - pain 8/10 with flexion of digits     Time  2    Period  Weeks    Status  New    Target Date  01/04/18      OT SHORT TERM GOAL #2   Title   R wrist AROM improve for pt to maintain midline to use hand in ADL's with more ease     Baseline  pt's wrist in RD, and flexion at rest- contracted - able to get to midline with effort but not able to maintain     Time  1    Period  Weeks    Status  New    Target Date  12/28/17      OT SHORT TERM GOAL #3   Title  Pain on PRWHE improve in R hand and wrist by more than 15 points     Baseline  at eval pain on PRHWE is 34/50     Time  3    Period  Weeks    Status  New    Target Date  01/11/18        OT Long Term Goals - 12/21/17 1845      OT LONG TERM GOAL #1   Title  R wrist AROM improve with more than 10-30 degrees in all planes to use R hand in more than 50% of ADL's     Baseline  see flowsheet for ROM , and not using hand in any tasks     Time  5    Period  Weeks    Status  New    Target Date  01/25/18      OT LONG TERM GOAL #2   Title  R grip strength increase to more than 50% compare to L hand to carry and hold objects at home and school     Baseline  NT - 4 wks s/p     Time  6    Period  Weeks    Status  New    Target Date  02/01/18      OT LONG TERM GOAL #3   Title  Function score on PRWHE imprrove with more than 25 points     Baseline  Function score at eval 47/50     Time  6    Period  Weeks    Status  New    Target Date  02/01/18            Plan - 01/15/18 1319    Clinical Impression Statement  Pt cont to show progress in wrist ext, sup appear this date- and scar  adhesion - as well as able to add gripping this date putty and BTE with wrist neutral - pt did pop some this date after Sup CPM on BTE - but not  consistant with  1 lbs weight - cont to monitor and upgrade HEP     Occupational performance deficits (Please refer to evaluation for details):  ADL's;IADL's;Work;Play;Leisure;Social Participation    Rehab Potential  Good    Current Impairments/barriers affecting progress:  Positive indicators: family support, motivation, age Negative indicators: multiple comorbidities    OT Frequency  -- 2-3 x wk    OT Duration  4 weeks    OT Treatment/Interventions  Self-care/ADL training;Therapeutic exercise;Moist Heat;Splinting;Patient/family education;Scar mobilization;Fluidtherapy;Cryotherapy;Contrast Bath;Manual Therapy;Passive range of motion    Plan  assess AROM and grip - upgrade as needed - PRWHE do     Clinical Decision Making  Multiple treatment options, significant modification of task necessary    OT Home Exercise Plan  see pt instruction     Consulted and Agree with Plan of Care  Patient       Patient will benefit from skilled therapeutic intervention in order to improve the following deficits and impairments:  Pain, Impaired flexibility, Increased edema, Decreased coordination, Impaired sensation, Decreased scar mobility, Decreased strength, Decreased range of motion, Impaired UE functional use, Decreased knowledge of precautions  Visit Diagnosis: Pain in right hand  Pain in right wrist  Muscle weakness (generalized)  Stiffness of right wrist, not elsewhere classified  Stiffness of right hand, not elsewhere classified    Problem List Patient Active Problem List   Diagnosis Date Noted  . TIA (transient ischemic attack) 10/12/2015  . Pregnancy, supervision of, high-risk 06/27/2011  . Lupus ([place]) 06/27/2011  . 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 Vaginal bleeding in pregnancy 06/27/2011  . Vaginal lesion 06/27/2011    Rosalyn Gess OTR/L,CLT 01/15/2018, 1:22 PM  [facility] [address] [place],  [address] Phone: [phone]   Fax:  [phone]  Name: VERNEICE CASPERS MRN: [id number] Date of Birth: 07/27/1976

## 2018-01-18 ENCOUNTER — Ambulatory Visit: Payer: BC Managed Care – PPO | Admitting: Occupational Therapy

## 2018-01-20 ENCOUNTER — Ambulatory Visit: Payer: BC Managed Care – PPO | Admitting: Occupational Therapy

## 2018-01-25 ENCOUNTER — Ambulatory Visit: Payer: BC Managed Care – PPO | Admitting: Occupational Therapy

## 2018-01-25 DIAGNOSIS — M6281 Muscle weakness (generalized): Secondary | ICD-10-CM

## 2018-01-25 DIAGNOSIS — M25641 Stiffness of right hand, not elsewhere classified: Secondary | ICD-10-CM

## 2018-01-25 DIAGNOSIS — M25531 Pain in right wrist: Secondary | ICD-10-CM

## 2018-01-25 DIAGNOSIS — M25631 Stiffness of right wrist, not elsewhere classified: Secondary | ICD-10-CM

## 2018-01-25 DIAGNOSIS — M79641 Pain in right hand: Secondary | ICD-10-CM

## 2018-01-25 NOTE — Patient Instructions (Signed)
Putty for grip teal  Avoid 3 point and lat grip  Band aid on IP of thumb to decrease triggering -and ice massage over volar MP of thumb   Cont wrist ext, UD stretch   2 lbs for UD - but do ext stretch first  1 lbs for wrist ext/ flexion  And 2 lbs pain free for AAROM for sup on lap

## 2018-01-25 NOTE — Therapy (Signed)
[place] [facility] [address], [place], [address] Phone: [phone]   Fax:  [phone]  Occupational Therapy Treatment  Patient Details  Name: Linda Vargas MRN: [id number] Date of Birth: 01-18-1976 Referring Provider: Arvella Nigh    Encounter Date: 01/25/2018  OT End of Session - 01/25/18 1017    Visit Number  11    Number of Visits  16    Date for OT Re-Evaluation  02/01/18    OT Start Time  0950    OT Stop Time  1044    OT Time Calculation (min)  54 min    Activity Tolerance  Patient tolerated treatment well    Behavior During Therapy  [facility] for tasks assessed/performed       Past Medical History:  Diagnosis Date  . Anxiety   . Connective tissue disorder (HCC)    undifferentiated  . Fibromyalgia   . Hypertension   . Lupus   . Migraine   . Seasonal allergic rhinitis     Past Surgical History:  Procedure Laterality Date  . CHOLECYSTECTOMY  2012  . DILATION AND CURETTAGE OF UTERUS  2011  . DILATION AND CURETTAGE OF UTERUS  01/17/2012   Procedure: DILATATION AND CURETTAGE;  Surgeon: Marylynn Pearson, MD;  Location: [place] [facility];  Service: Gynecology;  Laterality: N/A;  Dilatation and curratage, insertion of Bakri Balloon  . FOOT SURGERY    . OPEN REDUCTION INTERNAL FIXATION (ORIF) DISTAL RADIAL FRACTURE Right 11/26/2017   Procedure: OPEN REDUCTION INTERNAL FIXATION (ORIF) DISTAL RADIAL FRACTURE;  Surgeon: Hessie Knows, MD;  Location: [facility];  Service: Orthopedics;  Laterality: Right;  . TONSILLECTOMY      There were no vitals filed for this visit.  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  No/denies         [facility] OT Assessment - 01/25/18 0001      AROM   Right Forearm Supination  60 Degrees    Right Wrist Extension  28 Degrees    Right Wrist Flexion  52 Degrees    Right Wrist Radial Deviation  25 Degrees    Right Wrist Ulnar Deviation  8 Degrees      Strength   Right Hand Grip (lbs)  15    Right Hand Lateral Pinch  10 lbs    Right Hand 3 Point Pinch  5 lbs    Left Hand Grip (lbs)  58    Left Hand Lateral Pinch  15 lbs    Left Hand 3 Point Pinch  11 lbs        Assess AROM for wrist and grip /prehension strength assess  See flowsheet - pt was out last week with flu  Pt report in the am numbness in 2nd digit  And triggering at thumb - tender over A1pulley  Pt ed on band aid to stop triggering and ice massage  OT Treatments/Exercises (OP) - 01/25/18 0001      RUE Paraffin   Number Minutes Paraffin  10 Minutes    RUE Paraffin Location  Wrist    Comments  prior to manual and ROM - in sup position with heatinpad          Putty for grip upgrade to  teal  Avoid 3 point and lat grip  Because of triggering  Band aid on IP of thumb to decrease triggering -and ice massage over volar MP of thumb      UD done over computer table - 2 lbs for UD and midline RD - 10 reps  HEP to do and 2nd set 1 lbs keep wrist neutral - not into flexion   BTE CPM - 2 x of 200 sec for wrist extention 2 lbs for wrist extention - place and hold10 reps attempted - back to 1 lbs because of   Attempted 2 lbs for pron and partial sup - some pain - stop prior to pull   table slides for wrist extention on towel  and prior to UD with 2 lbs for decreasing flexion of wrist and pain   15 reps          Cont wrist ext, UD stretch   2 lbs for UD - but do ext stretch first  1 lbs for wrist ext/ flexion  And 2 lbs pain free for AAROM for sup on lap        OT Education - 01/25/18 1017    Education provided  Yes    Education Details  upgrade to teal putty, cont with ROM and strength  - and ice massage and bandaid for thumb IP     Person(s) Educated  Patient    Methods  Explanation;Demonstration;Tactile cues    Comprehension  Returned demonstration;Verbalized understanding       OT Education - 01/25/18 1017    Education provided  Yes    Education Details  upgrade to teal putty, cont with ROM and strength  - and ice massage and bandaid for thumb IP     Person(s) Educated  Patient    Methods  Explanation;Demonstration;Tactile cues    Comprehension  Returned demonstration;Verbalized understanding       OT Short Term Goals - 12/21/17 1839      OT SHORT TERM GOAL #1   Title  Pt R hand digits able to make full fist and touch palm , and extend digits to reach with hand in pocket     Baseline  see flowsheet - limited in flexion and extnetion - pain 8/10 with flexion of digits     Time  2    Period  Weeks    Status  New    Target Date  01/04/18      OT SHORT TERM GOAL #2   Title   R wrist AROM improve for pt to maintain midline to use hand in ADL's with more ease     Baseline  pt's wrist in RD, and flexion at rest- contracted - able to get to midline with effort but not able to maintain     Time  1    Period  Weeks    Status  New    Target Date  12/28/17      OT SHORT TERM GOAL #3   Title  Pain on PRWHE improve in R hand and wrist by more than 15 points     Baseline  at eval  pain on PRHWE is 34/50     Time  3    Period  Weeks    Status  New    Target Date  01/11/18        OT Long Term Goals - 12/21/17 1845      OT LONG TERM GOAL #1   Title  R wrist AROM improve with more than 10-30 degrees in all planes to use R hand in more than 50% of ADL's     Baseline  see flowsheet for ROM , and not using hand in any tasks     Time  5    Period  Weeks    Status  New    Target Date  01/25/18      OT LONG TERM GOAL #2    Title  R grip strength increase to more than 50% compare to L hand to carry and hold objects at home and school     Baseline  NT - 4 wks s/p     Time  6    Period  Weeks    Status  New    Target Date  02/01/18      OT LONG TERM GOAL #3   Title  Function score on PRWHE imprrove with more than 25 points     Baseline  Function score at eval 47/50     Time  6    Period  Weeks    Status  New    Target Date  02/01/18            Plan - 01/25/18 1019    Clinical Impression Statement  Pt had flu last week and was not seen - she did not make progress in AROM in wrist except some to sup - strength decrease in wrist - but was able to increase grip - did show this date triggering at thumb and tender over A1pulley of thumb - HEP provided - pt  to be seen Wed again - and surgeon next week     Occupational performance deficits (Please refer to evaluation for details):  ADL's;IADL's;Work;Play;Leisure;Social Participation    Rehab Potential  Good    Current Impairments/barriers affecting progress:  Positive indicators: family support, motivation, age Negative indicators: multiple comorbidities    OT Frequency  2x / week    OT Duration  4 weeks    OT Treatment/Interventions  Self-care/ADL training;Therapeutic exercise;Moist Heat;Splinting;Patient/family education;Scar mobilization;Fluidtherapy;Cryotherapy;Contrast Bath;Manual Therapy;Passive range of motion    Plan  PRWHE done and progress with new putty and ROM for extention     Clinical Decision Making  Multiple treatment options, significant modification of task necessary    OT Home Exercise Plan  see pt instruction     Consulted and Agree with Plan of Care  Patient       Patient will benefit from skilled therapeutic intervention in order to improve the following deficits and impairments:  Pain, Impaired flexibility, Increased edema, Decreased coordination, Impaired sensation, Decreased scar mobility, Decreased strength, Decreased range of  motion, Impaired UE functional use, Decreased knowledge of precautions  Visit Diagnosis: Pain in right hand  Pain in right wrist  Muscle weakness (generalized)  Stiffness of right wrist, not elsewhere classified  Stiffness of right hand, not elsewhere classified    Problem List Patient Active Problem List   Diagnosis Date Noted  . TIA (transient ischemic attack) 10/12/2015  . Pregnancy, supervision of, high-risk 06/27/2011  . Lupus ([place]) 06/27/2011  . 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 Vaginal  bleeding in pregnancy 06/27/2011  . Vaginal lesion 06/27/2011    Rosalyn Gess OTR/L,CLT 01/25/2018, 2:46 PM  [place] [facility] [address], [place], [address] Phone: [phone]   Fax:  [phone]  Name: Linda Vargas MRN: [id number] Date of Birth: Jan 20, 1976

## 2018-01-27 ENCOUNTER — Ambulatory Visit: Payer: BC Managed Care – PPO | Admitting: Occupational Therapy

## 2018-01-27 DIAGNOSIS — M25631 Stiffness of right wrist, not elsewhere classified: Secondary | ICD-10-CM

## 2018-01-27 DIAGNOSIS — M79641 Pain in right hand: Secondary | ICD-10-CM | POA: Diagnosis not present

## 2018-01-27 DIAGNOSIS — M6281 Muscle weakness (generalized): Secondary | ICD-10-CM

## 2018-01-27 DIAGNOSIS — M25531 Pain in right wrist: Secondary | ICD-10-CM

## 2018-01-27 DIAGNOSIS — M25641 Stiffness of right hand, not elsewhere classified: Secondary | ICD-10-CM

## 2018-01-27 NOTE — Therapy (Signed)
[place] [facility] [address] [place], [address] Phone: [phone]   Fax:  [phone]  Occupational Therapy Treatment  Patient Details  Name: Linda Vargas MRN: [id number] Date of Birth: 23-Jun-1976 Referring Provider: Arvella Nigh    Encounter Date: 01/27/2018  OT End of Session - 01/27/18 0816    Visit Number  12    Number of Visits  16    Date for OT Re-Evaluation  02/01/18    OT Start Time  0800    OT Stop Time  0857    OT Time Calculation (min)  57 min    Activity Tolerance  Patient tolerated treatment well    Behavior During Therapy  [facility] for tasks assessed/performed       Past Medical History:  Diagnosis Date  . Anxiety   . Connective tissue disorder (HCC)    undifferentiated  . Fibromyalgia   . Hypertension   . Lupus   . Migraine   . Seasonal allergic rhinitis     Past Surgical History:  Procedure Laterality Date  . CHOLECYSTECTOMY  2012  . DILATION AND CURETTAGE OF UTERUS  2011  . DILATION AND CURETTAGE OF UTERUS  01/17/2012   Procedure: DILATATION AND CURETTAGE;  Surgeon: Marylynn Pearson, MD;  Location: [place] [facility];  Service: Gynecology;  Laterality: N/A;  Dilatation and curratage, insertion of Bakri Balloon  . FOOT SURGERY    . OPEN REDUCTION INTERNAL FIXATION (ORIF) DISTAL RADIAL FRACTURE Right 11/26/2017   Procedure: OPEN REDUCTION INTERNAL FIXATION (ORIF) DISTAL RADIAL FRACTURE;  Surgeon: Hessie Knows, MD;  Location: [facility];  Service: Orthopedics;  Laterality: Right;  . TONSILLECTOMY      There were no vitals filed for this visit.  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  Yes    Pain Score  1     Pain Location  Wrist    Pain Orientation  Right    Pain Descriptors / Indicators  Tightness    Pain Type  Surgical pain    Pain Onset  More than a month ago                   OT Treatments/Exercises (OP) - 01/27/18 0001      RUE Paraffin   Number Minutes Paraffin  10 Minutes    RUE Paraffin Location  Wrist    Comments  prior to manual and ROM -        Putty for grip upgrade to  teal and to cont - report no increase pain  Avoid 3 point and lat grip  still  Because of triggering  Band aid on IP of thumb to decrease triggering -and ice massage over volar MP of thumb   3 point pinch on BTE at 15 lbs - 140 sec   UD done over computer table  - 2 kg ball - bilateral wrist over edge of table  10 reps   2 lbs for UD and midline RD  ,over table - 10 reps HEP to do and 2nd set 1 lbs keep wrist neutral - not  into flexion   BTE CPM - 2 x of 200 sec for wrist extention 1 lbs for small screw driver  for wrist extention on BTE - hard time going into extention but could do neutral with grip  140 sec   Pt again clicking in sup - so held off on until appt with surgeon Monday    table slides for wrist extention on [place] prior to UD with 2 lbs for decreasing flexion of wrist and pain  15 reps   Wrist extention increase to 45 at end of session                  OT Education - 01/27/18 0816    Education provided  Yes    Education Details  cont HEP but without  clicking - thyumb ice and bandaid     Person(s) Educated  Patient    Methods  Explanation;Demonstration;Tactile cues    Comprehension  Verbalized understanding;Returned demonstration       OT Short Term Goals - 12/21/17 1839      OT SHORT TERM GOAL #1   Title  Pt R hand digits able to make full fist and touch palm , and extend digits to reach with hand in pocket     Baseline  see flowsheet - limited in flexion and extnetion - pain 8/10 with flexion of digits     Time  2     Period  Weeks    Status  New    Target Date  01/04/18      OT SHORT TERM GOAL #2   Title   R wrist AROM improve for pt to maintain midline to use hand in ADL's with more ease     Baseline  pt's wrist in RD, and flexion at rest- contracted - able to get to midline with effort but not able to maintain     Time  1    Period  Weeks    Status  New    Target Date  12/28/17      OT SHORT TERM GOAL #3   Title  Pain on PRWHE improve in R hand and wrist by more than 15 points     Baseline  at eval pain on PRHWE is 34/50     Time  3    Period  Weeks    Status  New    Target Date  01/11/18        OT Long Term Goals - 12/21/17 1845      OT LONG TERM GOAL #1   Title  R wrist AROM improve with more than 10-30 degrees in all planes to use R hand in more than 50% of ADL's     Baseline  see flowsheet for ROM , and not using hand in any tasks     Time  5    Period  Weeks    Status  New    Target Date  01/25/18      OT LONG TERM GOAL #2   Title  R grip strength increase to more than 50% compare to L hand to carry and hold objects at home and school     Baseline  NT - 4 wks s/p     Time  6    Period  Weeks    Status  New    Target Date  02/01/18      OT LONG TERM GOAL #3   Title  Function score on PRWHE imprrove  with more than 25 points     Baseline  Function score at eval 47/50     Time  6    Period  Weeks    Status  New    Target Date  02/01/18            Plan - 01/27/18 5916    Clinical Impression Statement  Pt making progress with wrist extention in clinic this date but had clicking this date at head of ulna with partial sup - in combination of scar massage - pt to hold off on 2 lbs for sup - only do PROM - and stetch - and then still some cllicking at thumb - pt to see surgeon on Monday     Occupational performance deficits (Please refer to evaluation for details):  ADL's;IADL's;Work;Play;Leisure;Social Participation    Rehab Potential  Good    Current Impairments/barriers  affecting progress:  Positive indicators: family support, motivation, age Negative indicators: multiple comorbidities    OT Frequency  2x / week    OT Duration  4 weeks    OT Treatment/Interventions  Self-care/ADL training;Therapeutic exercise;Moist Heat;Splinting;Patient/family education;Scar mobilization;Fluidtherapy;Cryotherapy;Contrast Bath;Manual Therapy;Passive range of motion    Plan  PRWHE done and results from surgeon visit     Clinical Decision Making  Multiple treatment options, significant modification of task necessary    OT Home Exercise Plan  see pt instruction     Consulted and Agree with Plan of Care  Patient       Patient will benefit from skilled therapeutic intervention in order to improve the following deficits and impairments:  Pain, Impaired flexibility, Increased edema, Decreased coordination, Impaired sensation, Decreased scar mobility, Decreased strength, Decreased range of motion, Impaired UE functional use, Decreased knowledge of precautions  Visit Diagnosis: Pain in right hand  Pain in right wrist  Muscle weakness (generalized)  Stiffness of right wrist, not elsewhere classified  Stiffness of right hand, not elsewhere classified    Problem List Patient Active Problem List   Diagnosis Date Noted  . TIA (transient ischemic attack) 10/12/2015  . Pregnancy, supervision of, high-risk 06/27/2011  . Lupus ([place]) 06/27/2011  . 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 Vaginal bleeding in pregnancy 06/27/2011  . Vaginal lesion 06/27/2011    Rosalyn Gess OTR/L;,CLT 01/27/2018, 9:04 AM  [place] [facility] [address] [place], [address] Phone: [phone]   Fax:  [phone]  Name: MARLEY PAKULA MRN: [id number] Date of Birth: 1976/01/30

## 2018-01-27 NOTE — Patient Instructions (Signed)
Same HEP but hold off on 2 lbs for sup 'pronation

## 2018-01-28 ENCOUNTER — Encounter: Payer: BC Managed Care – PPO | Admitting: Occupational Therapy

## 2018-02-02 ENCOUNTER — Ambulatory Visit: Payer: BC Managed Care – PPO | Admitting: Occupational Therapy

## 2018-02-04 ENCOUNTER — Ambulatory Visit: Payer: BC Managed Care – PPO | Admitting: Occupational Therapy

## 2018-02-04 DIAGNOSIS — M6281 Muscle weakness (generalized): Secondary | ICD-10-CM

## 2018-02-04 DIAGNOSIS — M25531 Pain in right wrist: Secondary | ICD-10-CM

## 2018-02-04 DIAGNOSIS — M25631 Stiffness of right wrist, not elsewhere classified: Secondary | ICD-10-CM

## 2018-02-04 DIAGNOSIS — M25641 Stiffness of right hand, not elsewhere classified: Secondary | ICD-10-CM

## 2018-02-04 DIAGNOSIS — M79641 Pain in right hand: Secondary | ICD-10-CM | POA: Diagnosis not present

## 2018-02-04 NOTE — Patient Instructions (Signed)
Cont same HEP but use 1 or 2 lbs weight for wrist flexion and UD stretch = 3 x 30 sec  And PROM for wrist extention ' but don't push or work to much on sup/pro because of popping - hold off

## 2018-02-04 NOTE — Therapy (Signed)
[place] [facility] [address], [place], [address] Phone: [phone]   Fax:  [phone]  Occupational Therapy Treatment  Patient Details  Name: Linda Vargas MRN: [id number] Date of Birth: 23-Dec-1975 Referring Provider: Arvella Nigh    Encounter Date: 02/04/2018  OT End of Session - 02/04/18 1843    Visit Number  13    Number of Visits  16    Date for OT Re-Evaluation  02/01/18    OT Start Time  1301    OT Stop Time  1355    OT Time Calculation (min)  54 min    Activity Tolerance  Patient tolerated treatment well    Behavior During Therapy  [facility] for tasks assessed/performed       Past Medical History:  Diagnosis Date  . Anxiety   . Connective tissue disorder (HCC)    undifferentiated  . Fibromyalgia   . Hypertension   . Lupus   . Migraine   . Seasonal allergic rhinitis     Past Surgical History:  Procedure Laterality Date  . CHOLECYSTECTOMY  2012  . DILATION AND CURETTAGE OF UTERUS  2011  . DILATION AND CURETTAGE OF UTERUS  01/17/2012   Procedure: DILATATION AND CURETTAGE;  Surgeon: Marylynn Pearson, MD;  Location: [place] [facility];  Service: Gynecology;  Laterality: N/A;  Dilatation and curratage, insertion of Bakri Balloon  . FOOT SURGERY    . OPEN REDUCTION INTERNAL FIXATION (ORIF) DISTAL RADIAL FRACTURE Right 11/26/2017   Procedure: OPEN REDUCTION INTERNAL FIXATION (ORIF) DISTAL RADIAL FRACTURE;  Surgeon: Hessie Knows, MD;  Location: [facility];  Service: Orthopedics;  Laterality: Right;  . TONSILLECTOMY      There were no vitals filed for this visit.  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 , do my daughters hair, write, cook, open, carry objects, typing          [facility] OT Assessment - 02/04/18 0001      AROM   Right Wrist Extension  32 Degrees    Right Wrist Flexion  62 Degrees    Right Wrist Radial Deviation  30 Degrees    Right Wrist Ulnar Deviation  9 Degrees      Strength   Right Hand Grip (lbs)  22    Right Hand Lateral Pinch  12 lbs    Right Hand 3 Point Pinch  5 lbs    Left Hand Grip (lbs)  58    Left Hand Lateral Pinch  15 lbs    Left Hand 3 Point Pinch  11 lbs        assess AROM for wrist and grip and prehension strength  Cont with putty but hold off on prehension if thumb clicking or pain full  Cont with Band aid on IP of thumb to decrease triggering -and ice massage over volar MP of thumb Soft tissue mobs done for volar and hand - thumb MP volar - with graston tool nr 2   brushing and sweeping  UD done over table  - 2 lbs for 30 sec stretch   x3    2 lbs for UD and midline RD  ,over table - 10 reps HEP to do  Stretch for UD   BTE CPM -1 of x  200 sec for wrist extention 2 lbs flexion stretch for wrist - 30 sec   And l1 lbs for wrist ext and flexion    BTE for gripper at 25 lbs 150 sec a And 3 point grip 18 lbs 120 sec            OT Treatments/Exercises (OP) - 02/04/18 0001      RUE Paraffin   Number Minutes Paraffin  10 Minutes    RUE Paraffin Location  Wrist    Comments  Prior to ROM and soft tissue mobs              OT Education - 02/04/18 1842    Education provided  Yes    Education Details  cont with HEP to increase ROM - but avoid popping     Person(s) Educated  Patient    Methods  Explanation;Demonstration;Tactile cues    Comprehension  Verbalized understanding;Returned demonstration       OT Short Term Goals - 12/21/17 1839      OT SHORT TERM GOAL #1   Title  Pt R hand digits able to make full fist and touch palm , and extend digits to reach with hand in pocket      Baseline  see flowsheet - limited in flexion and extnetion - pain 8/10 with flexion of digits     Time  2    Period  Weeks    Status  New    Target Date  01/04/18      OT SHORT TERM GOAL #2   Title   R wrist AROM improve for pt to maintain midline to use hand in ADL's with more ease     Baseline  pt's wrist in RD, and flexion at rest- contracted - able to get to midline with effort but not able to maintain     Time  1    Period  Weeks    Status  New    Target Date  12/28/17      OT SHORT TERM GOAL #3   Title  Pain on PRWHE improve in R hand and wrist by more than 15 points     Baseline  at eval pain on PRHWE is 34/50     Time  3    Period  Weeks    Status  New    Target Date  01/11/18        OT Long Term Goals - 12/21/17 1845      OT LONG TERM GOAL #1   Title  R wrist AROM improve with more than 10-30 degrees in all planes to use R hand in more than 50% of ADL's     Baseline  see flowsheet for ROM , and not using hand in any tasks     Time  5    Period  Weeks    Status  New    Target Date  01/25/18      OT LONG TERM GOAL #2   Title  R grip strength increase to more than 50% compare to L hand to carry and hold objects at home and school     Baseline  NT - 4 wks s/p  Time  6    Period  Weeks    Status  New    Target Date  02/01/18      OT LONG TERM GOAL #3   Title  Function score on PRWHE imprrove with more than 25 points     Baseline  Function score at eval 47/50     Time  6    Period  Weeks    Status  New    Target Date  02/01/18            Plan - 02/04/18 1844    Clinical Impression Statement  Pt was seen week ago because of family health issues - and she seen surgeon - schedule for hardware removal on 9th May - pt cont to report clicking with sup and some triggering at thumb and then at times some sensation issues over back of hand and forearm since weekend - pt to work on grip , wrist flexion , ext and UD - but avoid popping at wrist with sup      Occupational performance deficits (Please refer to evaluation for details):  ADL's;IADL's;Work;Play;Leisure;Social Participation    Rehab Potential  Good    Current Impairments/barriers affecting progress:  Positive indicators: family support, motivation, age Negative indicators: multiple comorbidities    OT Frequency  2x / week    OT Duration  4 weeks    OT Treatment/Interventions  Self-care/ADL training;Therapeutic exercise;Moist Heat;Splinting;Patient/family education;Scar mobilization;Fluidtherapy;Cryotherapy;Contrast Bath;Manual Therapy;Passive range of motion    Plan  PRWHE done  and assess progress and adjust HEP     Clinical Decision Making  Multiple treatment options, significant modification of task necessary    OT Home Exercise Plan  see pt instruction     Consulted and Agree with Plan of Care  Patient       Patient will benefit from skilled therapeutic intervention in order to improve the following deficits and impairments:  Pain, Impaired flexibility, Increased edema, Decreased coordination, Impaired sensation, Decreased scar mobility, Decreased strength, Decreased range of motion, Impaired UE functional use, Decreased knowledge of precautions  Visit Diagnosis: Pain in right hand  Pain in right wrist  Muscle weakness (generalized)  Stiffness of right wrist, not elsewhere classified  Stiffness of right hand, not elsewhere classified    Problem List Patient Active Problem List   Diagnosis Date Noted  . TIA (transient ischemic attack) 10/12/2015  . Pregnancy, supervision of, high-risk 06/27/2011  . Lupus ([place]) 06/27/2011  . 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 Vaginal bleeding in pregnancy 06/27/2011  . Vaginal lesion 06/27/2011    Rosalyn Gess OTR/L,CLT 02/04/2018, 6:47 PM  [place] [facility] [address], [place], [address] Phone: [phone]   Fax:  [phone]  Name: Linda Vargas MRN: [id number] Date of Birth: Jun 19, 1976

## 2018-02-05 ENCOUNTER — Encounter: Payer: Self-pay | Admitting: Occupational Therapy

## 2018-02-11 ENCOUNTER — Other Ambulatory Visit: Payer: Self-pay

## 2018-02-11 ENCOUNTER — Encounter
Admission: RE | Admit: 2018-02-11 | Discharge: 2018-02-11 | Disposition: A | Discharge status: Home / Self Care | Payer: No Typology Code available for payment source | Source: Ambulatory Visit | Attending: Orthopedic Surgery | Admitting: Orthopedic Surgery

## 2018-02-11 ENCOUNTER — Ambulatory Visit: Payer: BC Managed Care – PPO | Attending: Orthopedic Surgery | Admitting: Occupational Therapy

## 2018-02-11 DIAGNOSIS — M25531 Pain in right wrist: Secondary | ICD-10-CM

## 2018-02-11 DIAGNOSIS — M25641 Stiffness of right hand, not elsewhere classified: Secondary | ICD-10-CM | POA: Insufficient documentation

## 2018-02-11 DIAGNOSIS — M25631 Stiffness of right wrist, not elsewhere classified: Secondary | ICD-10-CM | POA: Diagnosis present

## 2018-02-11 DIAGNOSIS — M79641 Pain in right hand: Secondary | ICD-10-CM

## 2018-02-11 DIAGNOSIS — M6281 Muscle weakness (generalized): Secondary | ICD-10-CM | POA: Insufficient documentation

## 2018-02-11 NOTE — Patient Instructions (Signed)
Your procedure is scheduled on:02/18/18 Report to Day Surgery. To find out your arrival time please call [phone] between 1PM - 3PM on Wed. 02/17/18.  Remember: Instructions that are not followed completely may result in serious medical risk, up to and including death, or upon the discretion of your surgeon and anesthesiologist your surgery may need to be rescheduled.     _X__ 1. Do not eat food after midnight the night before your procedure.                 No gum chewing or hard candies. You may drink clear liquids up to 2 hours                 before you are scheduled to arrive for your surgery- DO not drink clear                 liquids within 2 hours of the start of your surgery.                 Clear Liquids include:  water, apple juice without pulp, clear carbohydrate                 drink such as Clearfast of Gartorade, Black Coffee or Tea (Do not add                 anything to coffee or tea).  __X__2.  On the morning of surgery brush your teeth with toothpaste and water, you may rinse your mouth with mouthwash if you wish.  Do not swallow any              toothpaste of mouthwash.     _X__ 3.  No Alcohol for 24 hours before or after surgery.   _X__ 4.  Do Not Smoke or use e-cigarettes For 24 Hours Prior to Your Surgery.                 Do not use any chewable tobacco products for at least 6 hours prior to                 surgery.  ____  5.  Bring all medications with you on the day of surgery if instructed.   ____  6.  Notify your doctor if there is any change in your medical condition      (cold, fever, infections).     Do not wear jewelry, make-up, hairpins, clips or nail polish. Do not wear lotions, powders, or perfumes. You may wear deodorant. Do not shave 48 hours prior to surgery. Men may shave face and neck. Do not bring valuables to the hospital.    [facility] is not responsible for any belongings or valuables.  Contacts, dentures or  bridgework may not be worn into surgery. Leave your suitcase in the car. After surgery it may be brought to your room. For patients admitted to the hospital, discharge time is determined by your treatment team.   Patients discharged the day of surgery will not be allowed to drive home.   Please read over the following fact sheets that you were given:    _x___ Take these medicines the morning of surgery with A SIP OF WATER:    1. acetaminophen (TYLENOL) 500 MG tablet if needed  2. cetirizine (ZYRTEC) 10 MG tablet  3. fluticasone (FLONASE) 50 MCG/ACT nasal spray  4.hydroxychloroquine (PLAQUENIL) 200 MG tablet  5.labetalol (NORMODYNE) 200 MG tablet  6.  ____ Fleet Enema (as  directed)   _x___ Use CHG Soap as directed  ____ Use inhalers on the day of surgery  ____ Stop metformin 2 days prior to surgery    ____ Take 1/2 of usual insulin dose the night before surgery. No insulin the morning          of surgery.   ____ Stop Coumadin/Plavix/aspirin on   ____ Stop Anti-inflammatories on    ____ Stop supplements until after surgery.    ____ Bring C-Pap to the hospital.

## 2018-02-11 NOTE — Therapy (Signed)
[facility] [address] [place], [address] Phone: [phone]   Fax:  [phone]  Occupational Therapy Treatment  Patient Details  Name: Linda Vargas MRN: [id number] Date of Birth: 1976/09/19 Referring Provider: Arvella Nigh    Encounter Date: 02/11/2018  OT End of Session - 02/11/18 1259    Visit Number  14    Number of Visits  16    Date for OT Re-Evaluation  02/18/18    OT Start Time  0915    OT Stop Time  0955    OT Time Calculation (min)  40 min    Activity Tolerance  Patient tolerated treatment well    Behavior During Therapy  [facility] for tasks assessed/performed       Past Medical History:  Diagnosis Date  . Anxiety   . Connective tissue disorder (HCC)    undifferentiated  . Fibromyalgia   . Hypertension   . Lupus ([place])   . Migraine   . Seasonal allergic rhinitis     Past Surgical History:  Procedure Laterality Date  . CHOLECYSTECTOMY  2012  . DILATION AND CURETTAGE OF UTERUS  2011  . DILATION AND CURETTAGE OF UTERUS  01/17/2012   Procedure: DILATATION AND CURETTAGE;  Surgeon: Marylynn Pearson, MD;  Location: [facility];  Service: Gynecology;  Laterality: N/A;  Dilatation and curratage, insertion of Bakri Balloon  . FOOT SURGERY    . OPEN REDUCTION INTERNAL FIXATION (ORIF) DISTAL RADIAL FRACTURE Right 11/26/2017   Procedure: OPEN REDUCTION INTERNAL FIXATION (ORIF) DISTAL RADIAL FRACTURE;  Surgeon: Hessie Knows, MD;  Location: [facility];  Service: Orthopedics;  Laterality: Right;  . TONSILLECTOMY      There were no vitals filed for this visit.  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  No/denies         [facility] OT Assessment - 02/11/18 0001      AROM   Right Wrist Extension  35 Degrees    Right Wrist Flexion  62 Degrees    Right Wrist Radial Deviation  30 Degrees    Right Wrist Ulnar Deviation  12 Degrees      Strength   Right Hand Grip (lbs)  22    Right Hand 3 Point Pinch  5 lbs      assess AROM for wrist and grip - still increase AROM - but report increase popping during wrist ext on table and sup/pro - with some pain  Done PRWHE with pt this date - score improve to pain 21/50 and function 28/50 = simluated this date act - was able to carry 3 lbs but had pain 3/10 with 4 lbs  Weight          OT Treatments/Exercises (OP) - 02/11/18 0001      RUE Paraffin   Number Minutes Paraffin  10 Minutes    RUE Paraffin Location  Wrist    Comments  PROM for UD and wrist ext done while in paraffin  assess AROM for wrist and grip and prehension strength  Cont with putty but hold off on prehension if thumb clicking or pain full  Soft tissue mobs done for volar and hand - thumb MP volar - with graston tool nr 2   brushing and sweeping  BP taken - was 155/100 - pt on BP meds - was high at lupus clinic yesterday   Tape with kinesiotape 20% parallel to scar and 2 across at 100% pull   pt done in past - know precautions         OT Education - 02/11/18 1258    Education provided  Yes    Education Details  Same HEP - avoid popping and do kinesiotape for scar     Person(s) Educated  Patient    Methods  Demonstration;Tactile cues;Explanation    Comprehension  Verbalized understanding;Returned demonstration       OT Short Term Goals - 12/21/17 1839      OT SHORT TERM GOAL #1   Title  Pt R hand digits able to make full fist and touch palm , and extend digits to reach with hand in pocket     Baseline  see flowsheet - limited in flexion and extnetion - pain 8/10 with flexion of digits      Time  2    Period  Weeks    Status  New    Target Date  01/04/18      OT SHORT TERM GOAL #2   Title   R wrist AROM improve for pt to maintain midline to use hand in ADL's with more ease     Baseline  pt's wrist in RD, and flexion at rest- contracted - able to get to midline with effort but not able to maintain     Time  1    Period  Weeks    Status  New    Target Date  12/28/17      OT SHORT TERM GOAL #3   Title  Pain on PRWHE improve in R hand and wrist by more than 15 points     Baseline  at eval pain on PRHWE is 34/50     Time  3    Period  Weeks    Status  New    Target Date  01/11/18        OT Long Term Goals - 12/21/17 1845      OT LONG TERM GOAL #1   Title  R wrist AROM improve with more than 10-30 degrees in all planes to use R hand in more than 50% of ADL's     Baseline  see flowsheet for ROM , and not using hand in any tasks     Time  5    Period  Weeks    Status  New    Target Date  01/25/18      OT LONG TERM GOAL #2   Title  R grip strength increase to more than 50% compare to L hand to carry and hold objects at home and school     Baseline  NT - 4 wks s/p     Time  6    Period  Weeks    Status  New    Target Date  02/01/18      OT LONG TERM GOAL #3   Title  Function score on PRWHE imprrove with more than 25 points     Baseline  Function score at eval 47/50  Time  6    Period  Weeks    Status  New    Target Date  02/01/18            Plan - 02/11/18 1259    Clinical Impression Statement  Pt arrrive this date with reports of having since last week some more popping at ulnar side of wrist , with some pain -and during wrist extention table slides - thumb still click but less pain  - report increase use -but that her BP is running high and just stressed -  still schdule for surgery next week for hardware removal - BP taken today was still 155 /100 - pt on BP meds - pt to  try do stress relieve - decrease BP - need to let surgeon know prior to  surgery if BP still hight     Occupational performance deficits (Please refer to evaluation for details):  ADL's;IADL's;Work;Play;Leisure;Social Participation    Rehab Potential  Good    Current Impairments/barriers affecting progress:  Positive indicators: family support, motivation, age Negative indicators: multiple comorbidities    OT Frequency  1x / week    OT Duration  2 weeks    OT Treatment/Interventions  Self-care/ADL training;Therapeutic exercise;Moist Heat;Splinting;Patient/family education;Scar mobilization;Fluidtherapy;Cryotherapy;Contrast Bath;Manual Therapy;Passive range of motion    Plan  assess baseline for prior to surgery     Clinical Decision Making  Multiple treatment options, significant modification of task necessary    OT Home Exercise Plan  see pt instruction     Consulted and Agree with Plan of Care  Patient       Patient will benefit from skilled therapeutic intervention in order to improve the following deficits and impairments:  Pain, Impaired flexibility, Increased edema, Decreased coordination, Impaired sensation, Decreased scar mobility, Decreased strength, Decreased range of motion, Impaired UE functional use, Decreased knowledge of precautions  Visit Diagnosis: Pain in right hand  Pain in right wrist  Muscle weakness (generalized)  Stiffness of right wrist, not elsewhere classified  Stiffness of right hand, not elsewhere classified    Problem List Patient Active Problem List   Diagnosis Date Noted  . TIA (transient ischemic attack) 10/12/2015  . Pregnancy, supervision of, high-risk 06/27/2011  . Lupus ([place]) 06/27/2011  . 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 Vaginal bleeding in pregnancy 06/27/2011  . Vaginal lesion 06/27/2011    Rosalyn Gess OTR/L,CLT 02/11/2018, 1:03 PM  [facility] [address], [place], [address] Phone: [phone]   Fax:  [phone]  Name:  Linda Vargas MRN: [id number] Date of Birth: 1976/01/03

## 2018-02-11 NOTE — Patient Instructions (Signed)
Same HEP - but prevent popping and pain -  kinesiotape done for scar

## 2018-02-12 ENCOUNTER — Inpatient Hospital Stay: Admission: RE | Admit: 2018-02-12 | Payer: BC Managed Care – PPO | Source: Ambulatory Visit

## 2018-02-15 ENCOUNTER — Ambulatory Visit: Payer: BC Managed Care – PPO | Admitting: Occupational Therapy

## 2018-02-15 ENCOUNTER — Encounter
Admission: RE | Admit: 2018-02-15 | Discharge: 2018-02-15 | Disposition: A | Discharge status: Home / Self Care | Payer: BC Managed Care – PPO | Source: Ambulatory Visit | Attending: Orthopedic Surgery | Admitting: Orthopedic Surgery

## 2018-02-15 DIAGNOSIS — Z01810 Encounter for preprocedural cardiovascular examination: Secondary | ICD-10-CM | POA: Insufficient documentation

## 2018-02-15 DIAGNOSIS — E119 Type 2 diabetes mellitus without complications: Secondary | ICD-10-CM | POA: Insufficient documentation

## 2018-02-15 DIAGNOSIS — M6281 Muscle weakness (generalized): Secondary | ICD-10-CM

## 2018-02-15 DIAGNOSIS — I498 Other specified cardiac arrhythmias: Secondary | ICD-10-CM | POA: Diagnosis not present

## 2018-02-15 DIAGNOSIS — M25631 Stiffness of right wrist, not elsewhere classified: Secondary | ICD-10-CM

## 2018-02-15 DIAGNOSIS — I10 Essential (primary) hypertension: Secondary | ICD-10-CM | POA: Insufficient documentation

## 2018-02-15 DIAGNOSIS — M79641 Pain in right hand: Secondary | ICD-10-CM | POA: Diagnosis not present

## 2018-02-15 DIAGNOSIS — M25641 Stiffness of right hand, not elsewhere classified: Secondary | ICD-10-CM

## 2018-02-15 DIAGNOSIS — M25531 Pain in right wrist: Secondary | ICD-10-CM

## 2018-02-15 NOTE — Therapy (Signed)
[facility] [address], [place], [address] Phone: [phone]   Fax:  [phone]  Occupational Therapy Treatment  Patient Details  Name: Linda Vargas MRN: [id number] Date of Birth: December 23, 1975 Referring Provider: Arvella Nigh    Encounter Date: 02/15/2018  OT End of Session - 02/15/18 1809    Visit Number  15    Number of Visits  16    Date for OT Re-Evaluation  02/18/18    OT Start Time  1497    OT Stop Time  1510    OT Time Calculation (min)  36 min    Activity Tolerance  Patient tolerated treatment well    Behavior During Therapy  [facility] for tasks assessed/performed       Past Medical History:  Diagnosis Date  . Anxiety   . Connective tissue disorder (HCC)    undifferentiated  . Fibromyalgia   . Hypertension   . Lupus ([place])   . Migraine   . Seasonal allergic rhinitis     Past Surgical History:  Procedure Laterality Date  . CHOLECYSTECTOMY  2012  . DILATION AND CURETTAGE OF UTERUS  2011  . DILATION AND CURETTAGE OF UTERUS  01/17/2012   Procedure: DILATATION AND CURETTAGE;  Surgeon: Marylynn Pearson, MD;  Location: [facility];  Service: Gynecology;  Laterality: N/A;  Dilatation and curratage, insertion of Bakri Balloon  . FOOT SURGERY    . OPEN REDUCTION INTERNAL FIXATION (ORIF) DISTAL RADIAL FRACTURE Right 11/26/2017   Procedure: OPEN REDUCTION INTERNAL FIXATION (ORIF) DISTAL RADIAL FRACTURE;  Surgeon: Hessie Knows, MD;  Location: [facility];  Service: Orthopedics;  Laterality: Right;  . TONSILLECTOMY      There were no vitals filed for this visit.  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  Yes    Pain Score  2     Pain Location  Wrist    Pain Orientation  Right    Pain Descriptors / Indicators  Sore    Pain Type  Surgical pain    Pain Onset  More than a month ago    Pain Frequency  Intermittent         [facility] OT Assessment - 02/15/18 0001      AROM   Right Wrist Extension  35 Degrees    Right Wrist Flexion  62 Degrees    Right Wrist Radial Deviation  26 Degrees    Right Wrist Ulnar Deviation  12 Degrees      Strength   Right Hand Grip (lbs)  25    Right Hand Lateral Pinch  13 lbs    Right Hand 3 Point Pinch  7 lbs    Left Hand Grip (lbs)  58    Left Hand Lateral Pinch  15 lbs    Left Hand 3 Point Pinch  11 lbs       MEasurements taken - see flowsheet  pt report clicking of  thumb and is still tender over A1pulley of thumb  Also was tender over distal radius head - pull with Wynn Maudlin - but pt was since surgery in RD - and always felt stretch into UD on radial side with burn          OT Treatments/Exercises (OP) - 02/15/18 0001      RUE Paraffin   Number Minutes Paraffin  10 Minutes    RUE Paraffin Location  Wrist    Comments  Prior to soft tissue mobs       Did some graston tool over dorsal and volar wrist - and some AAROM in all planes - tight and pain full in sup and UD  Scar improving in adhesions with use of kinestiotape - Pt BP was still increase this date 168/94  Pt to contact her PCP - and surgeon prior to surgery   pt plan to do it this afternoon   Pt schedule for hardware removal Thursday - pt to contact me        OT Education - 02/15/18 1808    Education provided  Yes    Education Details  Same HEP - until surgery - but need to talk to surgeon and PCP about her BP prior to surgery    Person(s) Educated  Patient    Methods  Demonstration    Comprehension  Verbalized understanding;Returned demonstration       OT Short Term Goals -  02/15/18 1812      OT SHORT TERM GOAL #1   Title  Pt R hand digits able to make full fist and touch palm , and extend digits to reach with hand in pocket     Status  Achieved      OT SHORT TERM GOAL #2   Title   R wrist AROM improve for pt to maintain midline to use hand in ADL's with more ease     Status  Achieved      OT SHORT TERM GOAL #3   Title  Pain on PRWHE improve in R hand and wrist by more than 15 points     Baseline  at eval pain on PRHWE is 34/50 - improved but more soreness and pain only with ext and sup     Time  3    Period  Weeks    Status  On-going    Target Date  03/08/18        OT Long Term Goals - 02/15/18 1812      OT LONG TERM GOAL #1   Title  R wrist AROM improve with more than 10-30 degrees in all planes to use R hand in more than 50% of ADL's     Status  Achieved      OT LONG TERM GOAL #2   Title  R grip strength increase to more than 50% compare to L hand to carry and hold objects at home and school     Baseline  Grip R 25, L 58 lbs     Time  4    Period  Weeks    Status  On-going    Target Date  03/15/18      OT LONG TERM GOAL #3   Title  Function score on PRWHE imprrove with more than 25 points     Baseline  Function score at eval 47/50  - improved - but compensate secondary to lacking ext, sup, UD    Time  4    Period  Weeks    Status  On-going    Target Date  03/15/18      OT LONG TERM GOAL #4   Title  Pt. will increase RUE strength by 2 muscle grades for ADL /IADL tasks.    Baseline  RUE 4/5- wrist in all planes -     Time  6    Period  Weeks    Status  On-going    Target Date  03/29/18      OT LONG TERM GOAL #5   Title  Pt. will increase the awareness of her RUE and hand by using compensatory strategies 100% of the time     Baseline  impaired proprioceptive and kinesthetic awareness of RUE and hand.- because of decrease AROM     Time  6    Period  Weeks    Status  On-going    Target Date  03/29/18      OT LONG TERM GOAL #6    Title  Pt. will demonstrate visual compensatory strategies 100% of the time during ADL/IADLs within her environment.     Status  Achieved            Plan - 02/15/18 1810    Occupational performance deficits (Please refer to evaluation for details):  ADL's;IADL's;Work;Play;Leisure;Social Participation    Rehab Potential  Good    Current Impairments/barriers affecting progress:  Positive indicators: family support, motivation, age Negative indicators: multiple comorbidities    OT Frequency  1x / week    OT Duration  2 weeks    OT Treatment/Interventions  Self-care/ADL training;Therapeutic exercise;Moist Heat;Splinting;Patient/family education;Scar mobilization;Fluidtherapy;Cryotherapy;Contrast Bath;Manual Therapy;Passive range of motion    Plan  Pt scheduled for surgery to wrist Thursday - will contact surgeon and return when can cont OT    Clinical Decision Making  Several treatment options, min-mod task modification necessary    OT Home Exercise Plan  see pt instruction     Consulted and Agree with Plan of Care  Patient       Patient will benefit from skilled therapeutic intervention in order to improve the following deficits and impairments:  Pain, Impaired flexibility, Increased edema, Decreased coordination, Impaired sensation, Decreased scar mobility, Decreased strength, Decreased range of motion, Impaired UE functional use, Decreased knowledge of precautions  Visit Diagnosis: Pain in right hand  Pain in right wrist  Muscle weakness (generalized)  Stiffness of right wrist, not elsewhere classified  Stiffness of right hand, not elsewhere classified    Problem List Patient Active Problem List   Diagnosis Date Noted  . TIA (transient ischemic attack) 10/12/2015  . Pregnancy, supervision of, high-risk 06/27/2011  . Lupus ([place]) 06/27/2011  . 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 Vaginal bleeding in pregnancy 06/27/2011  . Vaginal lesion 06/27/2011    Rosalyn Gess  OTR/l,CLT 02/15/2018, 6:20 PM   [facility] [address], [place], [address] Phone: [phone]   Fax:  [phone]  Name: Linda Vargas MRN: [id number] Date of Birth: [birth year]

## 2018-02-15 NOTE — Patient Instructions (Signed)
Cont to do some pain free and without popping PROM , AROM  And functional use   Kinesiotape scar

## 2018-02-18 ENCOUNTER — Ambulatory Visit
Admission: RE | Admit: 2018-02-18 | Discharge: 2018-02-18 | Disposition: A | Discharge status: Home / Self Care | Payer: BC Managed Care – PPO | Source: Ambulatory Visit | Attending: Orthopedic Surgery | Admitting: Orthopedic Surgery

## 2018-02-18 ENCOUNTER — Ambulatory Visit: Payer: BC Managed Care – PPO | Admitting: Certified Registered"

## 2018-02-18 ENCOUNTER — Other Ambulatory Visit: Payer: Self-pay

## 2018-02-18 ENCOUNTER — Encounter: Payer: Self-pay | Admitting: *Deleted

## 2018-02-18 ENCOUNTER — Encounter
Admission: RE | Disposition: A | Discharge status: Home / Self Care | Payer: Self-pay | Source: Ambulatory Visit | Attending: Orthopedic Surgery

## 2018-02-18 DIAGNOSIS — F419 Anxiety disorder, unspecified: Secondary | ICD-10-CM | POA: Insufficient documentation

## 2018-02-18 DIAGNOSIS — J302 Other seasonal allergic rhinitis: Secondary | ICD-10-CM | POA: Diagnosis not present

## 2018-02-18 DIAGNOSIS — B0089 Other herpesviral infection: Secondary | ICD-10-CM | POA: Insufficient documentation

## 2018-02-18 DIAGNOSIS — M329 Systemic lupus erythematosus, unspecified: Secondary | ICD-10-CM | POA: Diagnosis not present

## 2018-02-18 DIAGNOSIS — F329 Major depressive disorder, single episode, unspecified: Secondary | ICD-10-CM | POA: Insufficient documentation

## 2018-02-18 DIAGNOSIS — G43909 Migraine, unspecified, not intractable, without status migrainosus: Secondary | ICD-10-CM | POA: Insufficient documentation

## 2018-02-18 DIAGNOSIS — Z7951 Long term (current) use of inhaled steroids: Secondary | ICD-10-CM | POA: Diagnosis not present

## 2018-02-18 DIAGNOSIS — Y831 Surgical operation with implant of artificial internal device as the cause of abnormal reaction of the patient, or of later complication, without mention of misadventure at the time of the procedure: Secondary | ICD-10-CM | POA: Diagnosis not present

## 2018-02-18 DIAGNOSIS — M797 Fibromyalgia: Secondary | ICD-10-CM | POA: Insufficient documentation

## 2018-02-18 DIAGNOSIS — T8484XA Pain due to internal orthopedic prosthetic devices, implants and grafts, initial encounter: Secondary | ICD-10-CM | POA: Diagnosis present

## 2018-02-18 DIAGNOSIS — I10 Essential (primary) hypertension: Secondary | ICD-10-CM | POA: Insufficient documentation

## 2018-02-18 DIAGNOSIS — Z79899 Other long term (current) drug therapy: Secondary | ICD-10-CM | POA: Insufficient documentation

## 2018-02-18 HISTORY — PX: WRIST SURGERY: SHX841

## 2018-02-18 HISTORY — PX: HARDWARE REMOVAL: SHX979

## 2018-02-18 LAB — POCT PREGNANCY, URINE: Preg Test, Ur: NEGATIVE

## 2018-02-18 SURGERY — REMOVAL, HARDWARE
Anesthesia: General | Laterality: Right

## 2018-02-18 MED ORDER — GLYCOPYRROLATE 0.2 MG/ML IJ SOLN
INTRAMUSCULAR | Status: DC | PRN
  Administered 2018-02-18: 0.2 mg via INTRAVENOUS

## 2018-02-18 MED ORDER — LIDOCAINE HCL (PF) 2 % IJ SOLN
INTRAMUSCULAR | Status: DC | PRN
  Administered 2018-02-18: 50 mg

## 2018-02-18 MED ORDER — FAMOTIDINE 20 MG PO TABS: 20.0000 mg | ORAL_TABLET | Freq: Once | ORAL | Status: DC

## 2018-02-18 MED ORDER — FENTANYL CITRATE (PF) 100 MCG/2ML IJ SOLN
INTRAMUSCULAR | Status: DC | PRN
  Administered 2018-02-18 (×2): 50 ug via INTRAVENOUS

## 2018-02-18 MED ORDER — DEXAMETHASONE SODIUM PHOSPHATE 10 MG/ML IJ SOLN
INTRAMUSCULAR | Status: AC
  Filled 2018-02-18: qty 1

## 2018-02-18 MED ORDER — CEFAZOLIN SODIUM-DEXTROSE 2-4 GM/100ML-% IV SOLN
2.0000 g | Freq: Once | INTRAVENOUS | Status: AC
  Administered 2018-02-18: 2 g via INTRAVENOUS

## 2018-02-18 MED ORDER — CEFAZOLIN SODIUM-DEXTROSE 2-4 GM/100ML-% IV SOLN
INTRAVENOUS | Status: AC
  Filled 2018-02-18: qty 100

## 2018-02-18 MED ORDER — PROPOFOL 10 MG/ML IV BOLUS
INTRAVENOUS | Status: AC
  Filled 2018-02-18: qty 20

## 2018-02-18 MED ORDER — LACTATED RINGERS IV SOLN
INTRAVENOUS | Status: DC
  Administered 2018-02-18: 10:00:00 via INTRAVENOUS

## 2018-02-18 MED ORDER — MEPERIDINE HCL 50 MG/ML IJ SOLN: 6.2500 mg | INTRAMUSCULAR | Status: DC | PRN

## 2018-02-18 MED ORDER — OXYCODONE HCL 5 MG/5ML PO SOLN: 5.0000 mg | Freq: Once | ORAL | Status: AC | PRN

## 2018-02-18 MED ORDER — FENTANYL CITRATE (PF) 100 MCG/2ML IJ SOLN
25.0000 ug | INTRAMUSCULAR | Status: AC | PRN
  Administered 2018-02-18 (×6): 25 ug via INTRAVENOUS

## 2018-02-18 MED ORDER — PROMETHAZINE HCL 25 MG/ML IJ SOLN: 6.2500 mg | INTRAMUSCULAR | Status: DC | PRN

## 2018-02-18 MED ORDER — MIDAZOLAM HCL 2 MG/2ML IJ SOLN
INTRAMUSCULAR | Status: AC
  Filled 2018-02-18: qty 2

## 2018-02-18 MED ORDER — PROPOFOL 10 MG/ML IV BOLUS
INTRAVENOUS | Status: DC | PRN
  Administered 2018-02-18: 200 mg via INTRAVENOUS

## 2018-02-18 MED ORDER — GLYCOPYRROLATE 0.2 MG/ML IJ SOLN
INTRAMUSCULAR | Status: AC
  Filled 2018-02-18: qty 1

## 2018-02-18 MED ORDER — FENTANYL CITRATE (PF) 100 MCG/2ML IJ SOLN
INTRAMUSCULAR | Status: AC
  Filled 2018-02-18: qty 2

## 2018-02-18 MED ORDER — ONDANSETRON HCL 4 MG/2ML IJ SOLN
INTRAMUSCULAR | Status: AC
  Filled 2018-02-18: qty 2

## 2018-02-18 MED ORDER — DEXAMETHASONE SODIUM PHOSPHATE 10 MG/ML IJ SOLN
INTRAMUSCULAR | Status: DC | PRN
  Administered 2018-02-18: 5 mg via INTRAVENOUS

## 2018-02-18 MED ORDER — NEOMYCIN-POLYMYXIN B GU 40-200000 IR SOLN
Status: DC | PRN
  Administered 2018-02-18: 2 mL

## 2018-02-18 MED ORDER — OXYCODONE HCL 5 MG PO TABS
ORAL_TABLET | ORAL | Status: AC
  Filled 2018-02-18: qty 1

## 2018-02-18 MED ORDER — OXYCODONE HCL 5 MG PO TABS
5.0000 mg | ORAL_TABLET | Freq: Once | ORAL | Status: AC | PRN
  Administered 2018-02-18: 5 mg via ORAL

## 2018-02-18 MED ORDER — MIDAZOLAM HCL 5 MG/5ML IJ SOLN
INTRAMUSCULAR | Status: DC | PRN
  Administered 2018-02-18: 2 mg via INTRAVENOUS

## 2018-02-18 MED ORDER — FENTANYL CITRATE (PF) 100 MCG/2ML IJ SOLN
INTRAMUSCULAR | Status: AC
  Administered 2018-02-18: 25 ug via INTRAVENOUS
  Filled 2018-02-18: qty 2

## 2018-02-18 MED ORDER — ONDANSETRON HCL 4 MG/2ML IJ SOLN
INTRAMUSCULAR | Status: DC | PRN
  Administered 2018-02-18: 4 mg via INTRAVENOUS

## 2018-02-18 MED ORDER — LIDOCAINE HCL (PF) 2 % IJ SOLN
INTRAMUSCULAR | Status: AC
  Filled 2018-02-18: qty 10

## 2018-02-18 SURGICAL SUPPLY — 34 items
CANISTER SUCT 1200ML W/VALVE (MISCELLANEOUS) ×2 IMPLANT
CHLORAPREP W/TINT 26ML (MISCELLANEOUS) ×2 IMPLANT
CUFF TOURN 24 STER (MISCELLANEOUS) IMPLANT
DRAPE C-ARM XRAY 36X54 (DRAPES) ×2 IMPLANT
DRAPE INCISE IOBAN 66X45 STRL (DRAPES) ×2 IMPLANT
DRSG EMULSION OIL 3X8 NADH (GAUZE/BANDAGES/DRESSINGS) ×2 IMPLANT
ELECT CAUTERY BLADE 6.4 (BLADE) ×2 IMPLANT
ELECT REM PT RETURN 9FT ADLT (ELECTROSURGICAL) ×2
ELECTRODE REM PT RTRN 9FT ADLT (ELECTROSURGICAL) ×1 IMPLANT
GAUZE PETRO XEROFOAM 1X8 (MISCELLANEOUS) ×2 IMPLANT
GAUZE SPONGE 4X4 12PLY STRL (GAUZE/BANDAGES/DRESSINGS) ×2 IMPLANT
GLOVE SURG SYN 9.0  PF PI (GLOVE) ×1
GLOVE SURG SYN 9.0 PF PI (GLOVE) ×1 IMPLANT
GOWN SRG 2XL LVL 4 RGLN SLV (GOWNS) ×1 IMPLANT
GOWN STRL NON-REIN 2XL LVL4 (GOWNS) ×2
GOWN STRL REUS W/ TWL LRG LVL3 (GOWN DISPOSABLE) ×1 IMPLANT
GOWN STRL REUS W/TWL LRG LVL3 (GOWN DISPOSABLE) ×2
KIT TURNOVER KIT A (KITS) ×2 IMPLANT
NDL FILTER BLUNT 18X1 1/2 (NEEDLE) ×1 IMPLANT
NEEDLE FILTER BLUNT 18X 1/2SAF (NEEDLE) ×1
NEEDLE FILTER BLUNT 18X1 1/2 (NEEDLE) ×1 IMPLANT
NS IRRIG 1000ML POUR BTL (IV SOLUTION) ×2 IMPLANT
PACK EXTREMITY ARMC (MISCELLANEOUS) ×2 IMPLANT
PAD ABD DERMACEA PRESS 5X9 (GAUZE/BANDAGES/DRESSINGS) ×4 IMPLANT
STAPLER SKIN PROX 35W (STAPLE) ×1 IMPLANT
SUT ETHIBOND NAB CT1 #1 30IN (SUTURE) ×1 IMPLANT
SUT ETHILON 3-0 FS-10 30 BLK (SUTURE) ×2
SUT VIC AB 0 CT1 36 (SUTURE) ×1 IMPLANT
SUT VIC AB 2-0 CT1 27 (SUTURE)
SUT VIC AB 2-0 CT1 TAPERPNT 27 (SUTURE) ×1 IMPLANT
SUT VIC AB 4-0 FS2 27 (SUTURE) ×1 IMPLANT
SUTURE EHLN 3-0 FS-10 30 BLK (SUTURE) ×1 IMPLANT
SYR 10ML LL (SYRINGE) ×2 IMPLANT
WATER STERILE IRR 1000ML POUR (IV SOLUTION) ×2 IMPLANT

## 2018-02-18 NOTE — Op Note (Signed)
02/18/2018  10:43 AM  PATIENT:  Linda Vargas  42 y.o. female  PRE-OPERATIVE DIAGNOSIS:  CLOSED FRACTURE OF DISTAL END OF RIGHT RADIUS WITH ROUTINE HEALING, painful hardware  POST-OPERATIVE DIAGNOSIS:  CLOSED FRACTURE OF DISTAL END OF RIGHT RADIUS WITH ROUTINE HEALING, painful hardware  PROCEDURE:  Procedure(s): HARDWARE REMOVAL RIGHT WRIST (Right)  SURGEON: Laurene Footman, MD  ASSISTANTS: None  ANESTHESIA:   general  EBL:  Total I/O In: 300 [I.V.:300] Out: -   BLOOD ADMINISTERED:none  DRAINS: none   LOCAL MEDICATIONS USED:  NONE  SPECIMEN:  No Specimen  DISPOSITION OF SPECIMEN:  N/A  COUNTS:  YES  TOURNIQUET:   Total Tourniquet Time Documented: Upper Arm (Right) - 11 minutes Total: Upper Arm (Right) - 11 minutes   IMPLANTS: None  DICTATION: .Dragon Dictation patient was brought to the operating room and after adequate anesthesia was obtained the right arm was prepped and draped you sterile fashion.  After patient identification and timeout procedures were completed tourniquet was raised and the prior volar incision was utilized with exposure of the FCR tendon.  The tendon sheath was incised and the tendon retracted radially with the deep fascia exposed the plate was easily identified and the pronator elevated off of this.  The proximal screws were removed first followed by exposure of the distal plate with removal of all pegs in the plate.  At the start of the case pronation was full supination was approximately 20 to 30 degrees after plate removal still only had a approximate 40 degrees supination so the volar distal radial ulnar joint was opened and following this supination could be brought to 90 degrees the wound was irrigated at this time and the wound then closed with 4-0 Vicryl subcutaneously 4-0 nylon for the skin followed by Xeroform 4 x 4 web roll and Ace wrap tourniquet let down at the close the case  PLAN OF CARE: Discharge to home after PACU  PATIENT  DISPOSITION:  PACU - hemodynamically stable.

## 2018-02-18 NOTE — Anesthesia Procedure Notes (Signed)
Procedure Name: LMA Insertion Performed by: Broadus Costilla, CRNA Pre-anesthesia Checklist: Patient identified, Patient being monitored, Timeout performed, Emergency Drugs available and Suction available Patient Re-evaluated:Patient Re-evaluated prior to induction Oxygen Delivery Method: Circle system utilized Preoxygenation: Pre-oxygenation with 100% oxygen Induction Type: IV induction Ventilation: Mask ventilation without difficulty LMA: LMA inserted LMA Size: 3.5 Tube type: Oral Number of attempts: 1 Placement Confirmation: positive ETCO2 and breath sounds checked- equal and bilateral Tube secured with: Tape Dental Injury: Teeth and Oropharynx as per pre-operative assessment        

## 2018-02-18 NOTE — H&P (Signed)
Reviewed paper H+P, will be scanned into chart. No changes noted.  

## 2018-02-18 NOTE — Anesthesia Postprocedure Evaluation (Signed)
Anesthesia Post Note  Patient: Linda Vargas  Procedure(s) Performed: HARDWARE REMOVAL RIGHT WRIST (Right )  Patient location during evaluation: PACU Anesthesia Type: General Level of consciousness: awake and alert and oriented Pain management: pain level controlled Vital Signs Assessment: post-procedure vital signs reviewed and stable Respiratory status: spontaneous breathing, nonlabored ventilation and respiratory function stable Cardiovascular status: blood pressure returned to baseline and stable Postop Assessment: no signs of nausea or vomiting Anesthetic complications: no     Last Vitals:  Vitals:   02/18/18 1145 02/18/18 1205  BP: (!) 163/90 (!) 185/94  Pulse: 65 60  Resp: 16 14  Temp: 37.2 C 36.6 C  SpO2: 100% 100%    Last Pain:  Vitals:   02/18/18 1205  TempSrc: Oral  PainSc: 7                  Zuzu Befort

## 2018-02-18 NOTE — Discharge Instructions (Addendum)
Work on finger range of motion.  Loosen Ace wrap if fingers well.  AMBULATORY SURGERY  DISCHARGE INSTRUCTIONS   1) The drugs that you were given will stay in your system until tomorrow so for the next 24 hours you should not:  A) Drive an automobile B) Make any legal decisions C) Drink any alcoholic beverage   2) You may resume regular meals tomorrow.  Today it is better to start with liquids and gradually work up to solid foods.  You may eat anything you prefer, but it is better to start with liquids, then soup and crackers, and gradually work up to solid foods.   3) Please notify your doctor immediately if you have any unusual bleeding, trouble breathing, redness and pain at the surgery site, drainage, fever, or pain not relieved by medication.    4) Additional Instructions:        Please contact your physician with any problems or Same Day Surgery at [phone], Monday through Friday 6 am to 4 pm, or [place] at [facility] number at [phone].

## 2018-02-18 NOTE — Transfer of Care (Signed)
Immediate Anesthesia Transfer of Care Note  Patient: Linda Vargas  Procedure(s) Performed: HARDWARE REMOVAL RIGHT WRIST (Right )  Patient Location: PACU  Anesthesia Type:General  Level of Consciousness: awake  Airway & Oxygen Therapy: Patient Spontanous Breathing and Patient connected to face mask oxygen  Post-op Assessment: Report given to RN and Post -op Vital signs reviewed and stable  Post vital signs: Reviewed  Last Vitals:  Vitals Value Taken Time  BP 107/82 02/18/2018 10:43 AM  Temp 36.7 C 02/18/2018 10:43 AM  Pulse 94 02/18/2018 10:43 AM  Resp 17 02/18/2018 10:43 AM  SpO2 97 % 02/18/2018 10:43 AM    Last Pain:  Vitals:   02/18/18 0811  TempSrc: Oral  PainSc: 3          Complications: No apparent anesthesia complications

## 2018-02-18 NOTE — Anesthesia Post-op Follow-up Note (Signed)
Anesthesia QCDR form completed.        

## 2018-02-18 NOTE — Anesthesia Preprocedure Evaluation (Signed)
Anesthesia Evaluation  Patient identified by MRN, date of birth, ID band Patient awake    Reviewed: Allergy & Precautions, NPO status , Patient's Chart, lab work & pertinent test results  History of Anesthesia Complications Negative for: history of anesthetic complications  Airway Mallampati: II  TM Distance: >3 FB Neck ROM: Full    Dental no notable dental hx.    Pulmonary neg pulmonary ROS, neg sleep apnea, neg COPD,    breath sounds clear to auscultation- rhonchi (-) wheezing      Cardiovascular hypertension, Pt. on medications (-) CAD, (-) Past MI, (-) Cardiac Stents and (-) CABG  Rhythm:Regular Rate:Normal - Systolic murmurs and - Diastolic murmurs    Neuro/Psych  Headaches, Anxiety    GI/Hepatic negative GI ROS, Neg liver ROS,   Endo/Other  negative endocrine ROSneg diabetes  Renal/GU negative Renal ROS     Musculoskeletal  (+) Fibromyalgia -  Abdominal (+) + obese,   Peds  Hematology negative hematology ROS (+)   Anesthesia Other Findings Past Medical History: No date: Anxiety No date: Connective tissue disorder (HCC)     Comment:  undifferentiated No date: Fibromyalgia No date: Hypertension No date: Lupus (HCC) No date: Migraine No date: Seasonal allergic rhinitis   Reproductive/Obstetrics                             Anesthesia Physical Anesthesia Plan  ASA: II  Anesthesia Plan: General   Post-op Pain Management:    Induction: Intravenous  PONV Risk Score and Plan: 2 and Ondansetron, Dexamethasone and Midazolam  Airway Management Planned: LMA  Additional Equipment:   Intra-op Plan:   Post-operative Plan:   Informed Consent: I have reviewed the patients History and Physical, chart, labs and discussed the procedure including the risks, benefits and alternatives for the proposed anesthesia with the patient or authorized representative who has indicated his/her  understanding and acceptance.   Dental advisory given  Plan Discussed with: CRNA and Anesthesiologist  Anesthesia Plan Comments:         Anesthesia Quick Evaluation

## 2018-02-19 ENCOUNTER — Encounter: Payer: Self-pay | Admitting: Nurse Practitioner

## 2018-02-19 ENCOUNTER — Ambulatory Visit: Payer: BC Managed Care – PPO | Admitting: Nurse Practitioner

## 2018-02-19 VITALS — BP 151/89 | HR 61 | Resp 16 | Ht 69.0 in | Wt 219.0 lb

## 2018-02-19 DIAGNOSIS — M329 Systemic lupus erythematosus, unspecified: Secondary | ICD-10-CM

## 2018-02-19 DIAGNOSIS — I10 Essential (primary) hypertension: Secondary | ICD-10-CM | POA: Diagnosis not present

## 2018-02-19 DIAGNOSIS — IMO0002 Reserved for concepts with insufficient information to code with codable children: Secondary | ICD-10-CM

## 2018-02-19 MED ORDER — HYDROCHLOROTHIAZIDE 25 MG PO TABS: 25.0000 mg | ORAL_TABLET | Freq: Every day | ORAL | 5 refills | Status: DC

## 2018-02-19 MED ORDER — LOSARTAN POTASSIUM 50 MG PO TABS: 50.0000 mg | ORAL_TABLET | Freq: Every day | ORAL | 3 refills | Status: DC

## 2018-02-19 NOTE — Progress Notes (Signed)
[facility] [place], [place] [address]  Internal MEDICINE  Office Visit Note  Patient Name: Linda Vargas  [id number]  [id number]  Date of Service: 02/19/2018  Pt is here for a sick visit.    Chief Complaint  Patient presents with  . Hypertension    prescription was changed was pregnant and had a miscarriage. wasnt sure if she need to just get the dosage changed on medication. just had surgery yesterday on wrist yesterday and has had some pain.     Blood pressure medication was changed back in December, 2018, due to pregnancy. She miscarried in January. Blood pressure has been elevated since the miscarriage. Over the past several days, blood pressure has been more significantly elevated. She would like to go back to meds, prescribed prior to her pregnancy. She denies headache, chest pain, chest pressure, or palpitations. She does admit to increased fatigue.   Hypertension  This is a recurrent problem. The current episode started more than 1 year ago. The problem has been gradually worsening since onset. The problem is uncontrolled. Associated symptoms include headaches and malaise/fatigue. Pertinent negatives include no chest pain, neck pain, palpitations or shortness of breath. There are no associated agents to hypertension. Risk factors for coronary artery disease include obesity and stress. Past treatments include beta blockers, angiotensin blockers and diuretics. The current treatment provides moderate improvement. There are no compliance problems.         Current Medication:  Outpatient Encounter Medications as of 02/19/2018  Medication Sig  . acetaminophen (TYLENOL) 500 MG tablet Take 1,000 mg by mouth every 8 (eight) hours as needed for headache.   . cetirizine (ZYRTEC) 10 MG tablet Take 10 mg by mouth daily.   Marland Kitchen EPIPEN 2-PAK 0.3 MG/0.3ML SOAJ injection INJECT INTRAMUSCULARLY AS DIRECTED FOR ANAPHYLATIC REACTIONS  . fexofenadine (ALLEGRA  ALLERGY) 180 MG tablet Take 180 mg by mouth daily.  . fluticasone (FLONASE) 50 MCG/ACT nasal spray Place 2 sprays into both nostrils daily as needed for allergies.  . hydroxychloroquine (PLAQUENIL) 200 MG tablet Take 200 mg by mouth 2 (two) times daily.    . [DISCONTINUED] labetalol (NORMODYNE) 200 MG tablet Take 200 mg by mouth 2 (two) times daily.   . hydrochlorothiazide (HYDRODIURIL) 25 MG tablet Take 1 tablet (25 mg total) by mouth daily.  Marland Kitchen losartan (COZAAR) 50 MG tablet Take 1 tablet (50 mg total) by mouth daily.  . ondansetron (ZOFRAN-ODT) 4 MG disintegrating tablet Take 1 tablet (4 mg total) by mouth every 8 (eight) hours as needed for nausea or vomiting. (Patient not taking: Reported on 02/05/2018)   No facility-administered encounter medications on file as of 02/19/2018.       Medical History: Past Medical History:  Diagnosis Date  . Anxiety   . Connective tissue disorder (HCC)    undifferentiated  . Fibromyalgia   . Hypertension   . Lupus ([place])   . Migraine   . Seasonal allergic rhinitis      Vital Signs: BP (!) 151/89 (BP Location: Left Arm, Patient Position: Sitting, Cuff Size: Large)   Pulse 61   Resp 16   Ht 5\' 9"  (1.753 m)   Wt 219 lb (99.3 kg)   LMP 01/24/2018 (Exact Date)   SpO2 99%   BMI 32.34 kg/m    Review of Systems  Constitutional: Positive for fatigue and malaise/fatigue. Negative for activity change, chills and unexpected weight change.  HENT: Negative for congestion, postnasal drip, rhinorrhea, sneezing and sore throat.  Eyes: Negative.  Negative for redness.  Respiratory: Negative for cough, chest tightness and shortness of breath.   Cardiovascular: Negative for chest pain and palpitations.  Gastrointestinal: Negative for abdominal pain, constipation, diarrhea, nausea and vomiting.  Endocrine: Negative for cold intolerance, heat intolerance, polydipsia, polyphagia and polyuria.  Genitourinary: Negative.  Negative for dysuria and frequency.   Musculoskeletal: Negative for arthralgias, back pain, joint swelling and neck pain.  Skin: Negative for rash.  Allergic/Immunologic: Negative for environmental allergies.  Neurological: Positive for headaches. Negative for tremors and numbness.  Hematological: Negative for adenopathy. Does not bruise/bleed easily.  Psychiatric/Behavioral: Negative for behavioral problems (Depression), sleep disturbance and suicidal ideas. The patient is not nervous/anxious.     Physical Exam  Constitutional: She is oriented to person, place, and time. She appears well-developed and well-nourished. No distress.  HENT:  Head: Normocephalic and atraumatic.  Mouth/Throat: Oropharynx is clear and moist. No oropharyngeal exudate.  Eyes: Pupils are equal, round, and reactive to light. Conjunctivae and EOM are normal.  Neck: Normal range of motion. Neck supple. No JVD present. No tracheal deviation present. No thyromegaly present.  Cardiovascular: Normal rate, regular rhythm and normal heart sounds. Exam reveals no gallop and no friction rub.  No murmur heard. Pulmonary/Chest: Effort normal and breath sounds normal. No respiratory distress. She has no wheezes. She has no rales. She exhibits no tenderness.  Abdominal: Soft. Bowel sounds are normal. There is no tenderness.  Musculoskeletal: Normal range of motion.  Lymphadenopathy:    She has no cervical adenopathy.  Neurological: She is alert and oriented to person, place, and time. No cranial nerve deficit.  Skin: Skin is warm and dry. Capillary refill takes less than 2 seconds. She is not diaphoretic.  Psychiatric: She has a normal mood and affect. Her behavior is normal. Judgment and thought content normal.  Nursing note and vitals reviewed.  Assessment/Plan: 1. Essential hypertension D/c losartan. Restart losartan 50mg  and HCTZ 25mg  daily. Encouraged her to continue with increased water and decreased salt intake.  - losartan (COZAAR) 50 MG tablet; Take 1  tablet (50 mg total) by mouth daily.  Dispense: 30 tablet; Refill: 3 - hydrochlorothiazide (HYDRODIURIL) 25 MG tablet; Take 1 tablet (25 mg total) by mouth daily.  Dispense: 30 tablet; Refill: 5  2. Lupus ([place]) Stable. Continue regular visits with rheumatology.   General Counseling: Maedell verbalizes understanding of the findings of todays visit and agrees with plan of treatment. I have discussed any further diagnostic evaluation that may be needed or ordered today. We also reviewed her medications today. she has been encouraged to call the office with any questions or concerns that should arise related to todays visit.  Hypertension Counseling:   The following hypertensive lifestyle modification were recommended and discussed:  1. Limiting alcohol intake to less than 1 oz/day of ethanol:(24 oz of beer or 8 oz of wine or 2 oz of 100-proof whiskey). 2. Take baby ASA 81 mg daily. 3. Importance of regular aerobic exercise and losing weight. 4. Reduce dietary saturated fat and cholesterol intake for overall cardiovascular health. 5. Maintaining adequate dietary potassium, calcium, and magnesium intake. 6. Regular monitoring of the blood pressure. 7. Reduce sodium intake to less than 100 mmol/day (less than 2.3 gm of sodium or less than 6 gm of sodium choride)   This patient was seen by Leretha Pol, FNP- C in Collaboration with Dr Lavera Guise as a part of collaborative care agreement    Meds ordered this encounter  Medications  .  losartan (COZAAR) 50 MG tablet    Sig: Take 1 tablet (50 mg total) by mouth daily.    Dispense:  30 tablet    Refill:  3    Order Specific Question:   Supervising Provider    Answer:   Lavera Guise [[id number]]  . hydrochlorothiazide (HYDRODIURIL) 25 MG tablet    Sig: Take 1 tablet (25 mg total) by mouth daily.    Dispense:  30 tablet    Refill:  5    Order Specific Question:   Supervising Provider    Answer:   Lavera Guise [[id number]]    Time spent: 15  Minutes

## 2018-02-25 ENCOUNTER — Ambulatory Visit: Payer: BC Managed Care – PPO | Admitting: Occupational Therapy

## 2018-02-25 ENCOUNTER — Other Ambulatory Visit: Payer: Self-pay

## 2018-02-25 ENCOUNTER — Encounter: Payer: Self-pay | Admitting: Occupational Therapy

## 2018-02-25 DIAGNOSIS — M25531 Pain in right wrist: Secondary | ICD-10-CM

## 2018-02-25 DIAGNOSIS — M25641 Stiffness of right hand, not elsewhere classified: Secondary | ICD-10-CM

## 2018-02-25 DIAGNOSIS — M6281 Muscle weakness (generalized): Secondary | ICD-10-CM

## 2018-02-25 DIAGNOSIS — M79641 Pain in right hand: Secondary | ICD-10-CM | POA: Diagnosis not present

## 2018-02-25 DIAGNOSIS — M25631 Stiffness of right wrist, not elsewhere classified: Secondary | ICD-10-CM

## 2018-02-25 NOTE — Patient Instructions (Signed)
Contrast  PROM for wrist ext ,PROM for UD  And use heat to stretch supination -and then AROM and AAROM for wrist sup / pro  3 x day

## 2018-02-25 NOTE — Therapy (Signed)
[facility] [address] [place], [address] Phone: [phone]   Fax:  [phone]  Occupational Therapy Treatment  Patient Details  Name: Linda Vargas MRN: [id number] Date of Birth: December 20, 1975 Referring Provider: Arvella Nigh    Encounter Date: 02/25/2018  OT End of Session - 02/25/18 1622    Visit Number  16    Number of Visits  28    Date for OT Re-Evaluation  04/08/18    OT Start Time  1303    OT Stop Time  1345    OT Time Calculation (min)  42 min    Activity Tolerance  Patient tolerated treatment well    Behavior During Therapy  [facility] for tasks assessed/performed       Past Medical History:  Diagnosis Date  . Anxiety   . Connective tissue disorder (HCC)    undifferentiated  . Fibromyalgia   . Hypertension   . Lupus ([place])   . Migraine   . Seasonal allergic rhinitis     Past Surgical History:  Procedure Laterality Date  . CHOLECYSTECTOMY  2012  . DILATION AND CURETTAGE OF UTERUS  2011  . DILATION AND CURETTAGE OF UTERUS  01/17/2012   Procedure: DILATATION AND CURETTAGE;  Surgeon: Marylynn Pearson, MD;  Location: [facility];  Service: Gynecology;  Laterality: N/A;  Dilatation and curratage, insertion of Bakri Balloon  . FOOT SURGERY    . HARDWARE REMOVAL Right 02/18/2018   Procedure: HARDWARE REMOVAL RIGHT WRIST;  Surgeon: Hessie Knows, MD;  Location: [facility];  Service: Orthopedics;  Laterality: Right;  . OPEN REDUCTION INTERNAL FIXATION (ORIF) DISTAL RADIAL FRACTURE Right 11/26/2017   Procedure: OPEN REDUCTION INTERNAL FIXATION (ORIF) DISTAL RADIAL FRACTURE;  Surgeon: Hessie Knows, MD;  Location: [facility];  Service: Orthopedics;  Laterality: Right;  . TONSILLECTOMY    . WRIST SURGERY Right 02/18/2018    There were no vitals filed for this visit.  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  Yes    Pain Score  5     Pain Location  Wrist    Pain Orientation  Right    Pain Descriptors / Indicators  Aching;Tingling;Numbness;Burning    Pain Type  Surgical pain    Pain Onset  1 to 4 weeks ago    Pain Frequency  Constant         [facility] OT Assessment - 02/25/18 0001      AROM   Right Forearm Supination  45 Degrees    Right Wrist Extension  24 Degrees    Right Wrist Flexion  63 Degrees    Right Wrist Radial Deviation  33 Degrees    Right Wrist Ulnar Deviation  3 Degrees  Assess AROM for wrist in all planes after surgery - see above       OT Treatments/Exercises (OP) - 02/25/18 0001      RUE Contrast Bath   Time  11 minutes    Comments  prior to ROM to increase ROM and decrease pain       after contrast done AAROM for sup /pro , wrist extention and UD   pt report pain decrease at end of session  And report since surgery increase numbness in thumb thru 3rd  And burning pain and increase pain over volar wrist - increase edema  see pt instruction for HEP        OT Education - 02/25/18 1621    Education provided  Yes    Education Details  AROM measurements compare to prior to surgery - POC for rehab -     Person(s) Educated  Patient    Methods  Explanation;Demonstration    Comprehension  Returned demonstration       OT Short Term Goals - 02/15/18 1812      OT SHORT TERM GOAL #1   Title  Pt R hand digits able to make full fist and touch palm , and extend digits to reach with hand in pocket     Status  Achieved       OT [place] #2   Title   R wrist AROM improve for pt to maintain midline to use hand in ADL's with more ease     Status  Achieved      OT SHORT TERM GOAL #3   Title  Pain on PRWHE improve in R hand and wrist by more than 15 points     Baseline  at eval pain on PRHWE is 34/50 - improved but more soreness and pain only with ext and sup     Time  3    Period  Weeks    Status  On-going    Target Date  03/08/18        OT Long Term Goals - 02/15/18 1812      OT LONG TERM GOAL #1   Title  R wrist AROM improve with more than 10-30 degrees in all planes to use R hand in more than 50% of ADL's     Status  Achieved      OT LONG TERM GOAL #2   Title  R grip strength increase to more than 50% compare to L hand to carry and hold objects at home and school     Baseline  Grip R 25, L 58 lbs     Time  4    Period  Weeks    Status  On-going    Target Date  03/15/18      OT LONG TERM GOAL #3   Title  Function score on PRWHE imprrove with more than 25 points     Baseline  Function score at eval 47/50  - improved - but compensate secondary to lacking ext, sup, UD    Time  4    Period  Weeks    Status  On-going    Target Date  03/15/18      OT LONG TERM GOAL #4   Title  Pt. will increase RUE strength by 2 muscle grades for ADL /IADL tasks.    Baseline  RUE 4/5- wrist in all planes -     Time  6    Period  Weeks  Status  On-going    Target Date  03/29/18      OT LONG TERM GOAL #5   Title  Pt. will increase the awareness of her RUE and hand by using compensatory strategies 100% of the time     Baseline  impaired proprioceptive and kinesthetic awareness of RUE and hand.- because of decrease AROM     Time  6    Period  Weeks    Status  On-going    Target Date  03/29/18      OT LONG TERM GOAL #6   Title  Pt. will demonstrate visual compensatory strategies 100% of the time during ADL/IADLs within her environment.     Status  Achieved            Plan - 02/25/18 1622     Clinical Impression Statement  Pt had report of popping at distal ulnar joint since about 3-4 wks ago when she was sick - she return this date after having hardware removed week ago - pain is between 5-9/10 and  with increase edema over volar wrist - bandage in place , will see PA for stitches removal in week - MD order for pt to start back ASAP ROM -  during surgery  surgeon got 90 degrees of supination- pt  showed this date 45 AROM SOC , and 55 with soft pop - anything beyond hat hard pop , but no pain - pt did not wear her splint and pt lost since surgry UD and extention -pt to wear splint again for OT please 2 hrs on and off during day - and night time - while attempting to decrease pain and edema - while improvig numbness that she has since surgery last week - did send Dr Rudene Christians note about popping and discuss yesterday ROM plan and orders after surgery     Occupational performance deficits (Please refer to evaluation for details):  ADL's;IADL's;Work;Play;Leisure;Social Participation    Rehab Potential  Fair    Current Impairments/barriers affecting progress:  Positive indicators: family support, motivation, age Negative indicators: multiple comorbidities- hardware removal - 3 months s/p ORIF     OT Frequency  2x / week    OT Duration  6 weeks    OT Treatment/Interventions  Self-care/ADL training;Therapeutic exercise;Moist Heat;Splinting;Patient/family education;Scar mobilization;Fluidtherapy;Cryotherapy;Contrast Bath;Manual Therapy;Passive range of motion    Plan  assess progress with HEP - popping     Clinical Decision Making  Several treatment options, min-mod task modification necessary    OT Home Exercise Plan  see pt instruction     Consulted and Agree with Plan of Care  Patient       Patient will benefit from skilled therapeutic intervention in order to improve the following deficits and impairments:  Pain, Impaired flexibility, Increased edema, Decreased coordination, Impaired sensation,  Decreased scar mobility, Decreased strength, Decreased range of motion, Impaired UE functional use, Decreased knowledge of precautions  Visit Diagnosis: Pain in right hand - Plan: Ot plan of care cert/re-cert  Pain in right wrist - Plan: Ot plan of care cert/re-cert  Muscle weakness (generalized) - Plan: Ot plan of care cert/re-cert  Stiffness of right wrist, not elsewhere classified - Plan: Ot plan of care cert/re-cert  Stiffness of right hand, not elsewhere classified - Plan: Ot plan of care cert/re-cert    Problem List Patient Active Problem List   Diagnosis Date Noted  . TIA (transient ischemic attack) 10/12/2015  . Pregnancy, supervision of, high-risk 06/27/2011  . Lupus ([place]) 06/27/2011  .  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 Vaginal bleeding in pregnancy 06/27/2011  . Vaginal lesion 06/27/2011    Rosalyn Gess OTR/L,CLT 02/25/2018, 4:37 PM  [facility] [address], [place], [address] Phone: [phone]   Fax:  [phone]  Name: Linda Vargas MRN: [id number] Date of Birth: [birth year]

## 2018-03-01 ENCOUNTER — Ambulatory Visit: Payer: BC Managed Care – PPO | Admitting: Occupational Therapy

## 2018-03-01 DIAGNOSIS — M25641 Stiffness of right hand, not elsewhere classified: Secondary | ICD-10-CM

## 2018-03-01 DIAGNOSIS — M79641 Pain in right hand: Secondary | ICD-10-CM

## 2018-03-01 DIAGNOSIS — M6281 Muscle weakness (generalized): Secondary | ICD-10-CM

## 2018-03-01 DIAGNOSIS — M25631 Stiffness of right wrist, not elsewhere classified: Secondary | ICD-10-CM

## 2018-03-01 DIAGNOSIS — M25531 Pain in right wrist: Secondary | ICD-10-CM

## 2018-03-01 NOTE — Therapy (Signed)
[facility] [address], [place], [address] Phone: [phone]   Fax:  [phone]  Occupational Therapy Treatment  Patient Details  Name: Linda Vargas MRN: [id number] Date of Birth: 10/08/1976 Referring Provider: Arvella Nigh    Encounter Date: 03/01/2018  OT End of Session - 03/01/18 1648    Visit Number  17    Number of Visits  28    Date for OT Re-Evaluation  04/08/18    OT Start Time  1146    OT Stop Time  1225    OT Time Calculation (min)  39 min    Activity Tolerance  Patient tolerated treatment well    Behavior During Therapy  [facility] for tasks assessed/performed       Past Medical History:  Diagnosis Date  . Anxiety   . Connective tissue disorder (HCC)    undifferentiated  . Fibromyalgia   . Hypertension   . Lupus ([place])   . Migraine   . Seasonal allergic rhinitis     Past Surgical History:  Procedure Laterality Date  . CHOLECYSTECTOMY  2012  . DILATION AND CURETTAGE OF UTERUS  2011  . DILATION AND CURETTAGE OF UTERUS  01/17/2012   Procedure: DILATATION AND CURETTAGE;  Surgeon: Marylynn Pearson, MD;  Location: [facility];  Service: Gynecology;  Laterality: N/A;  Dilatation and curratage, insertion of Bakri Balloon  . FOOT SURGERY    . HARDWARE REMOVAL Right 02/18/2018   Procedure: HARDWARE REMOVAL RIGHT WRIST;  Surgeon: Hessie Knows, MD;  Location: [facility];  Service: Orthopedics;  Laterality: Right;  . OPEN REDUCTION INTERNAL FIXATION (ORIF) DISTAL RADIAL FRACTURE Right 11/26/2017   Procedure: OPEN REDUCTION INTERNAL FIXATION (ORIF) DISTAL RADIAL FRACTURE;  Surgeon: Hessie Knows, MD;  Location: [facility];  Service: Orthopedics;  Laterality: Right;  . TONSILLECTOMY    . WRIST SURGERY Right 02/18/2018    There were no vitals filed for this visit.  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  Yes    Pain Score  2     Pain Location  Wrist    Pain Orientation  Right    Pain Descriptors / Indicators  Aching    Pain Type  Surgical pain    Pain Onset  1 to 4 weeks ago    Pain Frequency  Constant         [facility] OT Assessment - 03/01/18 0001      AROM   Right Forearm Supination  55 Degrees 60 after heat with soft pop     Right Wrist Extension  34 Degrees    Right Wrist Flexion  65 Degrees    Right Wrist Radial Deviation  30 Degrees    Right Wrist Ulnar Deviation  8 Degrees       Assess AROM for wrist in all planes   showed some progress since 4 days ago -and pain decrease from 5/10 with pain meds to  2/10 coming in          OT Treatments/Exercises (OP) - 03/01/18 0001      RUE Contrast Bath   Time  11 minutes    Comments  prior to ROM - and did 30 sec stretch and not stretch alternating -and ice inbetween       Soft tissue mobs to hand and Carpal spreads  Gentle traction to digits -    AAROM and PROM for wrist in all planes- focus on UD, extention and supination   stop prior to popping at wrist - and back and forth between neutral and supination  Was able to get to 60 degrees this date with soft popping   Pt splint on hour or 2 and off 3 hrs - to increase UD and extention     OT Education - 03/01/18 1647    Education provided  Yes    Education Details  AAROM and PROM - focus on supination     Person(s) Educated  Patient    Methods  Explanation;Demonstration    Comprehension  Verbalized understanding;Returned demonstration       OT Short Term Goals - 02/15/18 1812      OT SHORT TERM GOAL #1   Title  Pt R hand digits able to make full fist and touch palm , and  extend digits to reach with hand in pocket     Status  Achieved      OT SHORT TERM GOAL #2   Title   R wrist AROM improve for pt to maintain midline to use hand in ADL's with more ease     Status  Achieved      OT SHORT TERM GOAL #3   Title  Pain on PRWHE improve in R hand and wrist by more than 15 points     Baseline  at eval pain on PRHWE is 34/50 - improved but more soreness and pain only with ext and sup     Time  3    Period  Weeks    Status  On-going    Target Date  03/08/18        OT Long Term Goals - 02/15/18 1812      OT LONG TERM GOAL #1   Title  R wrist AROM improve with more than 10-30 degrees in all planes to use R hand in more than 50% of ADL's     Status  Achieved      OT LONG TERM GOAL #2   Title  R grip strength increase to more than 50% compare to L hand to carry and hold objects at home and school     Baseline  Grip R 25, L 58 lbs     Time  4    Period  Weeks    Status  On-going    Target Date  03/15/18      OT LONG TERM GOAL #3   Title  Function score on PRWHE imprrove with more than 25 points     Baseline  Function score at eval 47/50  - improved - but compensate secondary to lacking ext, sup, UD    Time  4    Period  Weeks    Status  On-going    Target Date  03/15/18      OT LONG TERM GOAL #4   Title  Pt. will increase RUE strength by 2 muscle grades for ADL /IADL tasks.    Baseline  RUE 4/5-  wrist in all planes -     Time  6    Period  Weeks    Status  On-going    Target Date  03/29/18      OT LONG TERM GOAL #5   Title  Pt. will increase the awareness of her RUE and hand by using compensatory strategies 100% of the time     Baseline  impaired proprioceptive and kinesthetic awareness of RUE and hand.- because of decrease AROM     Time  6    Period  Weeks    Status  On-going    Target Date  03/29/18      OT LONG TERM GOAL #6   Title  Pt. will demonstrate visual compensatory strategies 100% of the time during ADL/IADLs within her  environment.     Status  Achieved            Plan - 03/01/18 1649    Clinical Impression Statement  Pt this date responded good on supination stretch several times for 30 sec wiht heat and alternate with ice for edema and CTS - pt show increase supination to 60 degrees - and less popping than last Thursday , but still popping, numbness, tender over thumb A1pulley - and gradually weaning her out of splint again -she lost RD - when she was last week out of splint     Occupational performance deficits (Please refer to evaluation for details):  ADL's;IADL's;Work;Play;Leisure;Social Participation    Rehab Potential  Fair    Current Impairments/barriers affecting progress:  Positive indicators: family support, motivation, age Negative indicators: multiple comorbidities- hardware removal - 3 months s/p ORIF     OT Frequency  2x / week    OT Duration  6 weeks    OT Treatment/Interventions  Self-care/ADL training;Therapeutic exercise;Moist Heat;Splinting;Patient/family education;Scar mobilization;Fluidtherapy;Cryotherapy;Contrast Bath;Manual Therapy;Passive range of motion    Plan  assess progress with HEP - popping and update HEP as needed     Clinical Decision Making  Several treatment options, min-mod task modification necessary    OT Home Exercise Plan  see pt instruction     Consulted and Agree with Plan of Care  Patient       Patient will benefit from skilled therapeutic intervention in order to improve the following deficits and impairments:  Pain, Impaired flexibility, Increased edema, Decreased coordination, Impaired sensation, Decreased scar mobility, Decreased strength, Decreased range of motion, Impaired UE functional use, Decreased knowledge of precautions  Visit Diagnosis: Pain in right hand  Pain in right wrist  Muscle weakness (generalized)  Stiffness of right wrist, not elsewhere classified  Stiffness of right hand, not elsewhere classified    Problem List Patient  Active Problem List   Diagnosis Date Noted  . TIA (transient ischemic attack) 10/12/2015  . Pregnancy, supervision of, high-risk 06/27/2011  . Lupus ([place]) 06/27/2011  . 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 Vaginal bleeding in pregnancy 06/27/2011  . Vaginal lesion 06/27/2011    Rosalyn Gess OTR/L,CLT 03/01/2018, 4:57 PM  [facility] [address], [place], [address] Phone: [phone]   Fax:  [phone]  Name: MICHALINE KINDIG MRN: [id number] Date of Birth: 1976/02/16

## 2018-03-01 NOTE — Patient Instructions (Signed)
USE her contrast - the heat part for 30 sec stretch and relax  Then AAROM and PROM for wrist in all planes  stop prior to popping at wrist  Provided goal on goniometer  For pt to borrow - to shoot for supintion

## 2018-03-05 ENCOUNTER — Ambulatory Visit: Payer: BC Managed Care – PPO | Admitting: Occupational Therapy

## 2018-03-05 DIAGNOSIS — M25531 Pain in right wrist: Secondary | ICD-10-CM

## 2018-03-05 DIAGNOSIS — M6281 Muscle weakness (generalized): Secondary | ICD-10-CM

## 2018-03-05 DIAGNOSIS — M79641 Pain in right hand: Secondary | ICD-10-CM | POA: Diagnosis not present

## 2018-03-05 DIAGNOSIS — M25641 Stiffness of right hand, not elsewhere classified: Secondary | ICD-10-CM

## 2018-03-05 DIAGNOSIS — M25631 Stiffness of right wrist, not elsewhere classified: Secondary | ICD-10-CM

## 2018-03-05 NOTE — Patient Instructions (Signed)
Same

## 2018-03-05 NOTE — Therapy (Signed)
[facility] [address] [place], [address] Phone: [phone]   Fax:  [phone]  Occupational Therapy Treatment  Patient Details  Name: Linda Vargas MRN: [id number] Date of Birth: [birth year] Referring Provider: Arvella Nigh    Encounter Date: 03/05/2018  OT End of Session - 03/05/18 1235    Visit Number  18    Number of Visits  28    Date for OT Re-Evaluation  04/08/18    OT Start Time  1049    OT Stop Time  1128    OT Time Calculation (min)  39 min    Activity Tolerance  Patient tolerated treatment well    Behavior During Therapy  [facility] for tasks assessed/performed       Past Medical History:  Diagnosis Date  . Anxiety   . Connective tissue disorder (HCC)    undifferentiated  . Fibromyalgia   . Hypertension   . Lupus ([place])   . Migraine   . Seasonal allergic rhinitis     Past Surgical History:  Procedure Laterality Date  . CHOLECYSTECTOMY  2012  . DILATION AND CURETTAGE OF UTERUS  2011  . DILATION AND CURETTAGE OF UTERUS  01/17/2012   Procedure: DILATATION AND CURETTAGE;  Surgeon: Marylynn Pearson, MD;  Location: [facility];  Service: Gynecology;  Laterality: N/A;  Dilatation and curratage, insertion of Bakri Balloon  . FOOT SURGERY    . HARDWARE REMOVAL Right 02/18/2018   Procedure: HARDWARE REMOVAL RIGHT WRIST;  Surgeon: Hessie Knows, MD;  Location: [facility];  Service: Orthopedics;  Laterality: Right;  . OPEN REDUCTION INTERNAL FIXATION (ORIF) DISTAL RADIAL FRACTURE Right 11/26/2017   Procedure: OPEN REDUCTION INTERNAL FIXATION (ORIF) DISTAL RADIAL FRACTURE;  Surgeon: Hessie Knows, MD;  Location: [facility];  Service: Orthopedics;  Laterality: Right;  . TONSILLECTOMY    . WRIST SURGERY Right 02/18/2018    There were no vitals filed for this visit.  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  Yes    Pain Score  3     Pain Location  Wrist    Pain Orientation  Right    Pain Descriptors / Indicators  Aching;Tightness    Pain Type  Surgical pain    Pain Onset  1 to 4 weeks ago    Pain Frequency  Constant         [facility] OT Assessment - 03/05/18 0001      AROM   Right Forearm Supination  50 Degrees    Right Wrist Extension  36 Degrees    Right Wrist Ulnar Deviation  10 Degrees       tender over A1pulley of thumb   popping still at head of ulna  ? Xray   took bandage off - pt to keep tubigrip on until appt later this date with PA         OT  Treatments/Exercises (OP) - 03/05/18 0001      RUE Contrast Bath   Time  11 minutes    Comments  at [facility] 30 sec sup stretch , alternate wtih neutral - and ice         Soft tissue mobs to hand and Carpal spreads  Gentle traction to digits -    AAROM and PROM for wrist in all planes- focus on UD, extention and supination   stop prior to popping at wrist - and back and forth between neutral and supination  Was able to get to 60 degrees this date with soft popping  Can wean into neoprene soft splint to stabilize ulna head   did CPM for sup this date 200 sec at end   could do 65 AROM and PROM 65 - supination           OT Education - 03/05/18 1235    Education provided  Yes    Education Details  progress and and HEP     Person(s) Educated  Patient    Methods  Explanation;Demonstration    Comprehension  Verbalized understanding;Returned demonstration       OT Short Term Goals - 02/15/18 1812      OT SHORT TERM GOAL #1   Title  Pt R hand digits able to make full fist and touch  palm , and extend digits to reach with hand in pocket     Status  Achieved      OT SHORT TERM GOAL #2   Title   R wrist AROM improve for pt to maintain midline to use hand in ADL's with more ease     Status  Achieved      OT SHORT TERM GOAL #3   Title  Pain on PRWHE improve in R hand and wrist by more than 15 points     Baseline  at eval pain on PRHWE is 34/50 - improved but more soreness and pain only with ext and sup     Time  3    Period  Weeks    Status  On-going    Target Date  03/08/18        OT Long Term Goals - 02/15/18 1812      OT LONG TERM GOAL #1   Title  R wrist AROM improve with more than 10-30 degrees in all planes to use R hand in more than 50% of ADL's     Status  Achieved      OT LONG TERM GOAL #2   Title  R grip strength increase to more than 50% compare to L hand to carry and hold objects at home and school     Baseline  Grip R 25, L 58 lbs     Time  4    Period  Weeks    Status  On-going    Target Date  03/15/18      OT LONG TERM GOAL #3   Title  Function score on PRWHE imprrove with more than 25 points     Baseline  Function score at eval 47/50  - improved - but compensate secondary to lacking ext, sup, UD    Time  4    Period  Weeks    Status  On-going    Target Date  03/15/18      OT LONG TERM GOAL #4   Title  Pt. will increase RUE strength by 2 muscle grades for ADL /IADL tasks.  Baseline  RUE 4/5- wrist in all planes -     Time  6    Period  Weeks    Status  On-going    Target Date  03/29/18      OT LONG TERM GOAL #5   Title  Pt. will increase the awareness of her RUE and hand by using compensatory strategies 100% of the time     Baseline  impaired proprioceptive and kinesthetic awareness of RUE and hand.- because of decrease AROM     Time  6    Period  Weeks    Status  On-going    Target Date  03/29/18      OT LONG TERM GOAL #6   Title  Pt. will demonstrate visual compensatory strategies 100% of the time during ADL/IADLs within her  environment.     Status  Achieved            Plan - 03/05/18 1235    Clinical Impression Statement  Pt was little more tight today and popping initially but aftte contrast , heat , stretch and ROM - pt got to 60 degrees again -pt still report pins and needles , numbness , popping and thumb bothering her - and tender over A1pulley of thumb     Occupational performance deficits (Please refer to evaluation for details):  ADL's;IADL's;Work;Play;Leisure;Social Participation    Rehab Potential  Fair    Current Impairments/barriers affecting progress:  Positive indicators: family support, motivation, age Negative indicators: multiple comorbidities- hardware removal - 3 months s/p ORIF     OT Frequency  2x / week    OT Duration  4 weeks    OT Treatment/Interventions  Self-care/ADL training;Therapeutic exercise;Moist Heat;Splinting;Patient/family education;Scar mobilization;Fluidtherapy;Cryotherapy;Contrast Bath;Manual Therapy;Passive range of motion    Plan  assess progress with HEP - popping and update HEP as needed , scar massage initiate    Clinical Decision Making  Several treatment options, min-mod task modification necessary    OT Home Exercise Plan  see pt instruction     Consulted and Agree with Plan of Care  Patient       Patient will benefit from skilled therapeutic intervention in order to improve the following deficits and impairments:  Pain, Impaired flexibility, Increased edema, Decreased coordination, Impaired sensation, Decreased scar mobility, Decreased strength, Decreased range of motion, Impaired UE functional use, Decreased knowledge of precautions  Visit Diagnosis: Pain in right hand  Pain in right wrist  Muscle weakness (generalized)  Stiffness of right wrist, not elsewhere classified  Stiffness of right hand, not elsewhere classified    Problem List Patient Active Problem List   Diagnosis Date Noted  . TIA (transient ischemic attack) 10/12/2015  . Pregnancy,  supervision of, high-risk 06/27/2011  . Lupus ([place]) 06/27/2011  . 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 Vaginal bleeding in pregnancy 06/27/2011  . Vaginal lesion 06/27/2011    Rosalyn Gess OTR/l,CLT 03/05/2018, 12:37 PM  [facility] [address] [place], [address] Phone: [phone]   Fax:  [phone]  Name: Linda Vargas MRN: [id number] Date of Birth: 1976-04-05

## 2018-03-09 ENCOUNTER — Ambulatory Visit: Payer: BC Managed Care – PPO | Admitting: Occupational Therapy

## 2018-03-11 ENCOUNTER — Ambulatory Visit: Payer: BC Managed Care – PPO | Admitting: Occupational Therapy

## 2018-03-11 DIAGNOSIS — M25531 Pain in right wrist: Secondary | ICD-10-CM

## 2018-03-11 DIAGNOSIS — M25631 Stiffness of right wrist, not elsewhere classified: Secondary | ICD-10-CM

## 2018-03-11 DIAGNOSIS — M25641 Stiffness of right hand, not elsewhere classified: Secondary | ICD-10-CM

## 2018-03-11 DIAGNOSIS — M79641 Pain in right hand: Secondary | ICD-10-CM | POA: Diagnosis not present

## 2018-03-11 DIAGNOSIS — M6281 Muscle weakness (generalized): Secondary | ICD-10-CM

## 2018-03-11 NOTE — Therapy (Signed)
[place] [facility] [address] [place], [address] Phone: [phone]   Fax:  [phone]  Occupational Therapy Treatment  Patient Details  Name: Linda Vargas MRN: [id number] Date of Birth: [birth year] Referring Provider: Arvella Nigh    Encounter Date: 03/11/2018  OT End of Session - 03/11/18 1344    Visit Number  19    Number of Visits  28    Date for OT Re-Evaluation  04/08/18    OT Start Time  1105    OT Stop Time  1151    OT Time Calculation (min)  46 min    Activity Tolerance  Patient tolerated treatment well    Behavior During Therapy  [facility] for tasks assessed/performed       Past Medical History:  Diagnosis Date  . Anxiety   . Connective tissue disorder (HCC)    undifferentiated  . Fibromyalgia   . Hypertension   . Lupus ([place])   . Migraine   . Seasonal allergic rhinitis     Past Surgical History:  Procedure Laterality Date  . CHOLECYSTECTOMY  2012  . DILATION AND CURETTAGE OF UTERUS  2011  . DILATION AND CURETTAGE OF UTERUS  01/17/2012   Procedure: DILATATION AND CURETTAGE;  Surgeon: Marylynn Pearson, MD;  Location: [facility];  Service: Gynecology;  Laterality: N/A;  Dilatation and curratage, insertion of Bakri Balloon  . FOOT SURGERY    . HARDWARE REMOVAL Right 02/18/2018   Procedure: HARDWARE REMOVAL RIGHT WRIST;  Surgeon: Hessie Knows, MD;  Location: [facility];  Service: Orthopedics;  Laterality: Right;  . OPEN REDUCTION INTERNAL FIXATION (ORIF) DISTAL RADIAL FRACTURE Right 11/26/2017   Procedure: OPEN REDUCTION INTERNAL FIXATION (ORIF) DISTAL RADIAL FRACTURE;  Surgeon: Hessie Knows, MD;  Location: [facility];  Service: Orthopedics;  Laterality: Right;  . TONSILLECTOMY    . WRIST SURGERY Right 02/18/2018    There were no vitals filed for this visit.  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  Yes    Pain Score  2     Pain Location  Wrist    Pain Orientation  Right    Pain Descriptors / Indicators  Tightness;Aching    Pain Type  Surgical pain    Pain Onset  1 to 4 weeks ago    Pain Frequency  Constant         [facility] OT Assessment - 03/11/18 0001      AROM   Right Forearm Supination  60 Degrees        assess progress in supination and scar assess  cica scar pad provided for night time         OT Treatments/Exercises (OP) - 03/11/18 0001      RUE Paraffin   Number Minutes Paraffin  10 Minutes    RUE Paraffin Location  Wrist    Comments  prior to ROM - doing sup and  and neutral position every 30 sec        scar massage done - and mobs - pt ed on doing at home  AAROM and sup /pro wheel used -     Sup/pro wheel - from neutral to sup 20 reps with AAROM  Full range sup /pro 10 reps  1 lbs weight for sup and pron - 10 reps - hold onto end  Can do 2 sets if not increase pain  1lbs for RD, UD over armrest -stretch into UD  10 reps  2 sets if needed  PROM for wrist extention - prayer stretch , edge of table or on wall  10 reps  To do at home and  Ice at end      OT Education - 03/11/18 1344    Education provided  Yes    Education Details  HEP update and change    Person(s) Educated  Patient    Methods  Explanation;Demonstration;Handout    Comprehension  Returned demonstration       OT Short Term Goals - 02/15/18 1812      OT SHORT TERM GOAL #1   Title  Pt R hand digits able to make full fist and touch palm , and extend digits to reach with hand in pocket     Status   Achieved      OT SHORT TERM GOAL #2   Title   R wrist AROM improve for pt to maintain midline to use hand in ADL's with more ease     Status  Achieved      OT SHORT TERM GOAL #3   Title  Pain on PRWHE improve in R hand and wrist by more than 15 points     Baseline  at eval pain on PRHWE is 34/50 - improved but more soreness and pain only with ext and sup     Time  3    Period  Weeks    Status  On-going    Target Date  03/08/18        OT Long Term Goals - 02/15/18 1812      OT LONG TERM GOAL #1   Title  R wrist AROM improve with more than 10-30 degrees in all planes to use R hand in more than 50% of ADL's     Status  Achieved      OT LONG TERM GOAL #2   Title  R grip strength increase to more than 50% compare to L hand to carry and hold objects at home and school     Baseline  Grip R 25, L 58 lbs     Time  4    Period  Weeks    Status  On-going    Target Date  03/15/18      OT LONG TERM GOAL #3   Title  Function score on PRWHE imprrove with more than 25 points     Baseline  Function score at eval 47/50  - improved - but compensate secondary to lacking ext, sup, UD    Time  4    Period  Weeks    Status  On-going    Target Date  03/15/18      OT LONG TERM GOAL #4   Title  Pt. will increase RUE strength by 2 muscle grades for ADL /IADL tasks.    Baseline  RUE 4/5- wrist in  all planes -     Time  6    Period  Weeks    Status  On-going    Target Date  03/29/18      OT LONG TERM GOAL #5   Title  Pt. will increase the awareness of her RUE and hand by using compensatory strategies 100% of the time     Baseline  impaired proprioceptive and kinesthetic awareness of RUE and hand.- because of decrease AROM     Time  6    Period  Weeks    Status  On-going    Target Date  03/29/18      OT LONG TERM GOAL #6   Title  Pt. will demonstrate visual compensatory strategies 100% of the time during ADL/IADLs within her environment.     Status  Achieved            Plan -  03/11/18 1345    Clinical Impression Statement  Pt stitches come out about 6 days ago - ed on scar mobs and massage - and showed increase supination this date- still popping but after paraffin showed increase ROM - sup in clinic this date 75 degrees- and able to iniitiate this date 1 lbs for wrist  - pt report still numbness in thumb thru middle finger but shoulder and htumb pain was better this date     Occupational performance deficits (Please refer to evaluation for details):  ADL's;IADL's;Work;Play;Leisure;Social Participation    Rehab Potential  Fair    Current Impairments/barriers affecting progress:  Positive indicators: family support, motivation, age Negative indicators: multiple comorbidities- hardware removal - 3 months s/p ORIF     OT Frequency  2x / week    OT Duration  4 weeks    OT Treatment/Interventions  Self-care/ADL training;Therapeutic exercise;Moist Heat;Splinting;Patient/family education;Scar mobilization;Fluidtherapy;Cryotherapy;Contrast Bath;Manual Therapy;Passive range of motion    Plan  assess progress with HEP - popping and update HEP as needed , scar massage     Clinical Decision Making  Several treatment options, min-mod task modification necessary    OT Home Exercise Plan  see pt instruction     Consulted and Agree with Plan of Care  Patient       Patient will benefit from skilled therapeutic intervention in order to improve the following deficits and impairments:  Pain, Impaired flexibility, Increased edema, Decreased coordination, Impaired sensation, Decreased scar mobility, Decreased strength, Decreased range of motion, Impaired UE functional use, Decreased knowledge of precautions  Visit Diagnosis: Pain in right hand  Pain in right wrist  Muscle weakness (generalized)  Stiffness of right wrist, not elsewhere classified  Stiffness of right hand, not elsewhere classified    Problem List Patient Active Problem List   Diagnosis Date Noted  . TIA  (transient ischemic attack) 10/12/2015  . Pregnancy, supervision of, high-risk 06/27/2011  . Lupus ([place]) 06/27/2011  . 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 Vaginal bleeding in pregnancy 06/27/2011  . Vaginal lesion 06/27/2011    Rosalyn Gess OTR/L,CLT 03/11/2018, 1:47 PM  [place] [facility] [address], [place], [address] Phone: [phone]   Fax:  [phone]  Name: CHERAE MARTON MRN: [id number] Date of Birth: Jul 28, 1976

## 2018-03-11 NOTE — Patient Instructions (Signed)
Paraffin bath  Scar mobs and cica scar pad for night time   Sup/pro wheel - from neutral to sup 20 reps  Full range sup /pro 10 reps  1 lbs weight for sup and pron - 10 reps  Can do 2 sets if not increase pain  1lbs for RD, UD over armrest  10 reps  2 sets if needed  PROM for wrist extention - prayer stretch , edge of table or on wall  10 reps  Ice at end

## 2018-03-15 ENCOUNTER — Ambulatory Visit: Payer: BC Managed Care – PPO | Attending: Orthopedic Surgery | Admitting: Occupational Therapy

## 2018-03-15 DIAGNOSIS — M6281 Muscle weakness (generalized): Secondary | ICD-10-CM

## 2018-03-15 DIAGNOSIS — M25531 Pain in right wrist: Secondary | ICD-10-CM

## 2018-03-15 DIAGNOSIS — M25631 Stiffness of right wrist, not elsewhere classified: Secondary | ICD-10-CM | POA: Diagnosis present

## 2018-03-15 DIAGNOSIS — M25641 Stiffness of right hand, not elsewhere classified: Secondary | ICD-10-CM | POA: Diagnosis present

## 2018-03-15 DIAGNOSIS — M79641 Pain in right hand: Secondary | ICD-10-CM | POA: Diagnosis present

## 2018-03-15 NOTE — Patient Instructions (Signed)
Cont with same HEP   but upgrade to 2 lbs for sup and pro -supported   and 2 lbs for RD, UD - 8 reps  over arm rest 1 lbs place and hold for wrist ext  10 reps   2  X day   Wear Benik wrist wrap during sup and pronation HEP to decrease numbness

## 2018-03-15 NOTE — Therapy (Signed)
[place] [facility] [address], [place], [address] Phone: [phone]   Fax:  [phone]  Occupational Therapy Treatment  Patient Details  Name: Linda Vargas MRN: [id number] Date of Birth: 20-Mar-1976 Referring Provider: Arvella Nigh    Encounter Date: 03/15/2018  OT End of Session - 03/15/18 1443    Visit Number  20    Number of Visits  28    Date for OT Re-Evaluation  04/08/18    OT Start Time  0955    OT Stop Time  1051    OT Time Calculation (min)  56 min    Activity Tolerance  Patient tolerated treatment well    Behavior During Therapy  [facility] for tasks assessed/performed       Past Medical History:  Diagnosis Date  . Anxiety   . Connective tissue disorder (HCC)    undifferentiated  . Fibromyalgia   . Hypertension   . Lupus ([place])   . Migraine   . Seasonal allergic rhinitis     Past Surgical History:  Procedure Laterality Date  . CHOLECYSTECTOMY  2012  . DILATION AND CURETTAGE OF UTERUS  2011  . DILATION AND CURETTAGE OF UTERUS  01/17/2012   Procedure: DILATATION AND CURETTAGE;  Surgeon: Marylynn Pearson, MD;  Location: [facility];  Service: Gynecology;  Laterality: N/A;  Dilatation and curratage, insertion of Bakri Balloon  . FOOT SURGERY    . HARDWARE REMOVAL Right 02/18/2018   Procedure: HARDWARE REMOVAL RIGHT WRIST;  Surgeon: Hessie Knows, MD;  Location: [facility];  Service: Orthopedics;  Laterality: Right;  . OPEN REDUCTION INTERNAL FIXATION (ORIF) DISTAL RADIAL FRACTURE Right 11/26/2017   Procedure: OPEN REDUCTION INTERNAL FIXATION (ORIF) DISTAL RADIAL FRACTURE;  Surgeon: Hessie Knows, MD;  Location: [facility];  Service: Orthopedics;  Laterality: Right;  . TONSILLECTOMY    . WRIST SURGERY Right 02/18/2018    There were no vitals filed for this visit.  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  Yes    Pain Score  1     Pain Location  Wrist    Pain Orientation  Right    Pain Descriptors / Indicators  Sore    Pain Type  Surgical pain    Pain Onset  1 to 4 weeks ago         [facility] OT Assessment - 03/15/18 0001      AROM   Right Forearm Supination  75 Degrees               OT Treatments/Exercises (OP) - 03/15/18 0001      RUE Paraffin   Number Minutes Paraffin  10 Minutes    RUE Paraffin Location  Wrist    Comments  AAROM and PROM stretch for 30 sec several time  with heaintgpad       scar massage done - and mobs - pt ed on doing at home  Used some the graston tool nr 2 for volar forearm brushing   AAROM  by OT and sup /pro wheel used -   CPM on BTE for sup - 200 sec   Sup/pro wheel - from neutral to sup 20 reps with AAROM  Full range sup /pro 10 reps  2  lbs weight for sup and pron - 10 reps -  But supported on lap  1 set 2 x day  2lbs for RD, UD over armrest -stretch into UD  8 reps  1 set  2 x day  PROM for wrist extention - prayer stretch , edge of table or on wall  10 reps  And 1 lbs for place and hold over armrest  10 reps   2 x day  All done in clinic  Pt can wearing benik neoprene splint for sup / pro - decrease numbness in session   and sleep with hard wrist splint   Ice at end  or can do contrast in the am         OT Education - 03/15/18 1443    Education provided  Yes    Education Details  stretches , wearing of Benik with HEP ,sup and pro- and hard splint night time     Person(s) Educated  Patient    Methods  Explanation;Demonstration    Comprehension  Verbalized understanding;Returned demonstration       OT Short Term Goals - 02/15/18 1812      OT SHORT TERM GOAL #1   Title   Pt R hand digits able to make full fist and touch palm , and extend digits to reach with hand in pocket     Status  Achieved      OT SHORT TERM GOAL #2   Title   R wrist AROM improve for pt to maintain midline to use hand in ADL's with more ease     Status  Achieved      OT SHORT TERM GOAL #3   Title  Pain on PRWHE improve in R hand and wrist by more than 15 points     Baseline  at eval pain on PRHWE is 34/50 - improved but more soreness and pain only with ext and sup     Time  3    Period  Weeks    Status  On-going    Target Date  03/08/18        OT Long Term Goals - 02/15/18 1812      OT LONG TERM GOAL #1   Title  R wrist AROM improve with more than 10-30 degrees in all planes to use R hand in more than 50% of ADL's     Status  Achieved      OT LONG TERM GOAL #2   Title  R grip strength increase to more than 50% compare to L hand to carry and hold objects at home and school     Baseline  Grip R 25, L 58 lbs     Time  4    Period  Weeks    Status  On-going    Target Date  03/15/18      OT LONG TERM GOAL #3   Title  Function score on PRWHE imprrove with more than 25 points     Baseline  Function score at eval 47/50  - improved - but compensate secondary to lacking ext, sup, UD    Time  4    Period  Weeks    Status  On-going    Target Date  03/15/18  OT LONG TERM GOAL #4   Title  Pt. will increase RUE strength by 2 muscle grades for ADL /IADL tasks.    Baseline  RUE 4/5- wrist in all planes -     Time  6    Period  Weeks    Status  On-going    Target Date  03/29/18      OT LONG TERM GOAL #5   Title  Pt. will increase the awareness of her RUE and hand by using compensatory strategies 100% of the time     Baseline  impaired proprioceptive and kinesthetic awareness of RUE and hand.- because of decrease AROM     Time  6    Period  Weeks    Status  On-going    Target Date  03/29/18      OT LONG TERM GOAL #6   Title  Pt. will demonstrate visual compensatory  strategies 100% of the time during ADL/IADLs within her environment.     Status  Achieved            Plan - 03/15/18 1445    Clinical Impression Statement  Pt show increase AROM and PROM for R sup and pronation - but pt report this date that she cont to have popping but since this weekend has increase numbness at night time and daytime - but pt during session had decrease numbness with wearing of Benik neoprene  during sup and pron exercises     Occupational performance deficits (Please refer to evaluation for details):  ADL's;IADL's;Work;Play;Leisure;Social Participation    Rehab Potential  Fair    Current Impairments/barriers affecting progress:  Positive indicators: family support, motivation, age Negative indicators: multiple comorbidities- hardware removal - 3 months s/p ORIF     OT Frequency  2x / week    OT Duration  4 weeks    OT Treatment/Interventions  Self-care/ADL training;Therapeutic exercise;Moist Heat;Splinting;Patient/family education;Scar mobilization;Fluidtherapy;Cryotherapy;Contrast Bath;Manual Therapy;Passive range of motion    Plan  assess progress with HEP - popping and numbness     Clinical Decision Making  Several treatment options, min-mod task modification necessary    OT Home Exercise Plan  see pt instruction     Consulted and Agree with Plan of Care  Patient       Patient will benefit from skilled therapeutic intervention in order to improve the following deficits and impairments:  Pain, Impaired flexibility, Increased edema, Decreased coordination, Impaired sensation, Decreased scar mobility, Decreased strength, Decreased range of motion, Impaired UE functional use, Decreased knowledge of precautions  Visit Diagnosis: Pain in right hand  Pain in right wrist  Muscle weakness (generalized)  Stiffness of right wrist, not elsewhere classified  Stiffness of right hand, not elsewhere classified    Problem List Patient Active Problem List   Diagnosis  Date Noted  . TIA (transient ischemic attack) 10/12/2015  . Pregnancy, supervision of, high-risk 06/27/2011  . Lupus ([place]) 06/27/2011  . 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 Vaginal bleeding in pregnancy 06/27/2011  . Vaginal lesion 06/27/2011    Rosalyn Gess OTR/L,CLT 03/15/2018, 2:50 PM  [place] [facility] [address] [place], [address] Phone: [phone]   Fax:  [phone]  Name: Linda Vargas MRN: [id number] Date of Birth: 08-14-1976

## 2018-03-18 ENCOUNTER — Ambulatory Visit: Payer: BC Managed Care – PPO | Admitting: Occupational Therapy

## 2018-03-22 ENCOUNTER — Ambulatory Visit: Payer: BC Managed Care – PPO | Admitting: Occupational Therapy

## 2018-03-22 DIAGNOSIS — M25631 Stiffness of right wrist, not elsewhere classified: Secondary | ICD-10-CM

## 2018-03-22 DIAGNOSIS — M6281 Muscle weakness (generalized): Secondary | ICD-10-CM

## 2018-03-22 DIAGNOSIS — M25531 Pain in right wrist: Secondary | ICD-10-CM

## 2018-03-22 DIAGNOSIS — M25641 Stiffness of right hand, not elsewhere classified: Secondary | ICD-10-CM

## 2018-03-22 DIAGNOSIS — M79641 Pain in right hand: Secondary | ICD-10-CM | POA: Diagnosis not present

## 2018-03-22 NOTE — Therapy (Signed)
[facility] [address], [place], [address] Phone: [phone]   Fax:  [phone]  Occupational Therapy Treatment  Patient Details  Name: Linda Vargas MRN: [id number] Date of Birth: Jul 12, 1976 Referring Provider: Arvella Nigh    Encounter Date: 03/22/2018  OT End of Session - 03/22/18 0957    Visit Number  1    Number of Visits  28    Date for OT Re-Evaluation  04/08/18    OT Start Time  0918    OT Stop Time  1003    OT Time Calculation (min)  45 min    Activity Tolerance  Patient tolerated treatment well    Behavior During Therapy  [facility] for tasks assessed/performed       Past Medical History:  Diagnosis Date  . Anxiety   . Connective tissue disorder (HCC)    undifferentiated  . Fibromyalgia   . Hypertension   . Lupus ([place])   . Migraine   . Seasonal allergic rhinitis     Past Surgical History:  Procedure Laterality Date  . CHOLECYSTECTOMY  2012  . DILATION AND CURETTAGE OF UTERUS  2011  . DILATION AND CURETTAGE OF UTERUS  01/17/2012   Procedure: DILATATION AND CURETTAGE;  Surgeon: Marylynn Pearson, MD;  Location: [facility];  Service: Gynecology;  Laterality: N/A;  Dilatation and curratage, insertion of Bakri Balloon  . FOOT SURGERY    . HARDWARE REMOVAL Right 02/18/2018   Procedure: HARDWARE REMOVAL RIGHT WRIST;  Surgeon: Hessie Knows, MD;  Location: [facility];  Service: Orthopedics;  Laterality: Right;  . OPEN REDUCTION INTERNAL FIXATION (ORIF) DISTAL RADIAL FRACTURE Right 11/26/2017   Procedure: OPEN REDUCTION INTERNAL FIXATION (ORIF) DISTAL RADIAL FRACTURE;  Surgeon: Hessie Knows, MD;  Location: [facility];  Service: Orthopedics;  Laterality: Right;  . TONSILLECTOMY    . WRIST SURGERY Right 02/18/2018    There were no vitals filed for this visit.  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  No/denies         [facility] OT Assessment - 03/22/18 0001      AROM   Right Forearm Supination  70 Degrees 85 in session      Strength   Right Hand Grip (lbs)  35    Right Hand Lateral Pinch  13 lbs    Right Hand 3 Point Pinch  6 lbs    Left Hand Grip (lbs)  58    Left Hand Lateral Pinch  15 lbs    Left Hand 3 Point Pinch  11 lbs       assess progress in supination -and grip and prehension  Add and review teal putty for gripping and 3 point - 3 x thru short "snake"  And keep wrist straight - do no collapse into flexion         OT Treatments/Exercises (OP) - 03/22/18 0001      RUE Paraffin   Number Minutes Paraffin  10 Minutes  RUE Paraffin Location  Wrist    Comments  AAROM and PROM in sup <> neutral during paraffin with heaintpad        scar massage done - and mobs - pt ed on doing at home  Used some the graston tool nr 2 for volar forearm brushing   AAROM by OT  BTE for sup  Done 0 lbs - 2 x 120 sec during session    2  lbs weight for sup and pron - 10 reps -  unsupported and rest on other had this date on pronation  2 sets PROM for UD - 10 reps  And then  2lbs for RD, UD over armrest-stretch into UD 10 reps  2 sets - but keep burn under 2/10 PROM for wrist extention - table slides this date  And then place and hold 2 lbs - and 2nd set 1 lbs  2 x day  All done in clinic  Pt can wearing benik neoprene splint for sup / pro - decrease numbness in session   and sleep with hard wrist splint   Ice at endat home if needed - for CTS  or can do contrast in the am       OT Education - 03/22/18 0957    Education provided  Yes    Education Details  review and update HEP    Person(s)  Educated  Patient    Methods  Explanation    Comprehension  Verbalized understanding;Returned demonstration       OT Short Term Goals - 02/15/18 1812      OT SHORT TERM GOAL #1   Title  Pt R hand digits able to make full fist and touch palm , and extend digits to reach with hand in pocket     Status  Achieved      OT SHORT TERM GOAL #2   Title   R wrist AROM improve for pt to maintain midline to use hand in ADL's with more ease     Status  Achieved      OT SHORT TERM GOAL #3   Title  Pain on PRWHE improve in R hand and wrist by more than 15 points     Baseline  at eval pain on PRHWE is 34/50 - improved but more soreness and pain only with ext and sup     Time  3    Period  Weeks    Status  On-going    Target Date  03/08/18        OT Long Term Goals - 02/15/18 1812      OT LONG TERM GOAL #1   Title  R wrist AROM improve with more than 10-30 degrees in all planes to use R hand in more than 50% of ADL's     Status  Achieved      OT LONG TERM GOAL #2   Title  R grip strength increase to more than 50% compare to L hand to carry and hold objects at home and school     Baseline  Grip R 25, L 58 lbs     Time  4    Period  Weeks    Status  On-going    Target Date  03/15/18      OT LONG TERM GOAL #3   Title  Function score on PRWHE imprrove with more than 25 points     Baseline  Function score at eval 47/50  - improved - but  compensate secondary to lacking ext, sup, UD    Time  4    Period  Weeks    Status  On-going    Target Date  03/15/18      OT LONG TERM GOAL #4   Title  Pt. will increase RUE strength by 2 muscle grades for ADL /IADL tasks.    Baseline  RUE 4/5- wrist in all planes -     Time  6    Period  Weeks    Status  On-going    Target Date  03/29/18      OT LONG TERM GOAL #5   Title  Pt. will increase the awareness of her RUE and hand by using compensatory strategies 100% of the time     Baseline  impaired proprioceptive and kinesthetic awareness of RUE and  hand.- because of decrease AROM     Time  6    Period  Weeks    Status  On-going    Target Date  03/29/18      OT LONG TERM GOAL #6   Title  Pt. will demonstrate visual compensatory strategies 100% of the time during ADL/IADLs within her environment.     Status  Achieved            Plan - 03/22/18 0958    Clinical Impression Statement  Pt cont to make progress in AROM at R sup - walk in 70 degrees but in session after session 85 - and able to maintain progress thru out session this date - less popping - and able to do 1-2 lbs depending on what  exercise - numbness nad pins and needels cont  -did add putty this date - pt need to assess if CTS increase  - pt see surgeon next Friday - and need to discuss numbness with him     Occupational performance deficits (Please refer to evaluation for details):  ADL's;IADL's;Work;Play;Leisure;Social Participation    Rehab Potential  Fair    Current Impairments/barriers affecting progress:  Positive indicators: family support, motivation, age Negative indicators: multiple comorbidities- hardware removal - 3 months s/p ORIF     OT Frequency  2x / week    OT Duration  4 weeks    OT Treatment/Interventions  Self-care/ADL training;Therapeutic exercise;Moist Heat;Splinting;Patient/family education;Scar mobilization;Fluidtherapy;Cryotherapy;Contrast Bath;Manual Therapy;Passive range of motion    Plan  assess progress sup - if can do 2 lbs for all ranges for wrist -and how did with putty- CTS ?? same     Clinical Decision Making  Several treatment options, min-mod task modification necessary    OT Home Exercise Plan  see pt instruction     Consulted and Agree with Plan of Care  Patient       Patient will benefit from skilled therapeutic intervention in order to improve the following deficits and impairments:  Pain, Impaired flexibility, Increased edema, Decreased coordination, Impaired sensation, Decreased scar mobility, Decreased strength, Decreased range  of motion, Impaired UE functional use, Decreased knowledge of precautions  Visit Diagnosis: Pain in right hand  Pain in right wrist  Muscle weakness (generalized)  Stiffness of right wrist, not elsewhere classified  Stiffness of right hand, not elsewhere classified    Problem List Patient Active Problem List   Diagnosis Date Noted  . TIA (transient ischemic attack) 10/12/2015  . Pregnancy, supervision of, high-risk 06/27/2011  . Lupus ([place]) 06/27/2011  . 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 Vaginal bleeding in pregnancy 06/27/2011  . Vaginal lesion 06/27/2011  Rosalyn Gess OTR/L,CLT 03/22/2018, 10:59 AM  [facility] [address] [place], [address] Phone: [phone]   Fax:  [phone]  Name: Linda Vargas MRN: [id number] Date of Birth: September 30, 1976

## 2018-03-22 NOTE — Patient Instructions (Addendum)
Cont same PROM and AROM   but do first set of wrist ext - 2 lbs , 2nd set 1 lbs  Repeat supination wheel and 2 lbs at end of session again  And unsupported - except can rest in pronation on her other hand  PROM for UD prior to 2 lbs weight pain free - for UD - 2 x 10 reps

## 2018-03-26 ENCOUNTER — Ambulatory Visit: Payer: BC Managed Care – PPO | Admitting: Occupational Therapy

## 2018-03-30 ENCOUNTER — Ambulatory Visit: Payer: BC Managed Care – PPO | Admitting: Occupational Therapy

## 2018-03-30 DIAGNOSIS — M79641 Pain in right hand: Secondary | ICD-10-CM

## 2018-03-30 DIAGNOSIS — M25631 Stiffness of right wrist, not elsewhere classified: Secondary | ICD-10-CM

## 2018-03-30 DIAGNOSIS — M25641 Stiffness of right hand, not elsewhere classified: Secondary | ICD-10-CM

## 2018-03-30 DIAGNOSIS — M25531 Pain in right wrist: Secondary | ICD-10-CM

## 2018-03-30 DIAGNOSIS — M6281 Muscle weakness (generalized): Secondary | ICD-10-CM

## 2018-04-03 ENCOUNTER — Encounter: Payer: Self-pay | Admitting: Occupational Therapy

## 2018-04-03 NOTE — Therapy (Signed)
[facility] [address], [place], [address] Phone: [phone]   Fax:  [phone]  Occupational Therapy Treatment  Patient Details  Name: Linda Vargas MRN: [id number] Date of Birth: 18-Aug-1976 Referring Provider: Arvella Nigh    Encounter Date: 03/30/2018  OT End of Session - 04/03/18 0838    Visit Number  2    Number of Visits  28    Date for OT Re-Evaluation  04/08/18    OT Start Time  9983    OT Stop Time  1528    OT Time Calculation (min)  43 min    Activity Tolerance  Patient tolerated treatment well    Behavior During Therapy  [facility] for tasks assessed/performed       Past Medical History:  Diagnosis Date  . Anxiety   . Connective tissue disorder (HCC)    undifferentiated  . Fibromyalgia   . Hypertension   . Lupus ([place])   . Migraine   . Seasonal allergic rhinitis     Past Surgical History:  Procedure Laterality Date  . CHOLECYSTECTOMY  2012  . DILATION AND CURETTAGE OF UTERUS  2011  . DILATION AND CURETTAGE OF UTERUS  01/17/2012   Procedure: DILATATION AND CURETTAGE;  Surgeon: Marylynn Pearson, MD;  Location: [facility];  Service: Gynecology;  Laterality: N/A;  Dilatation and curratage, insertion of Bakri Balloon  . FOOT SURGERY    . HARDWARE REMOVAL Right 02/18/2018   Procedure: HARDWARE REMOVAL RIGHT WRIST;  Surgeon: Hessie Knows, MD;  Location: [facility];  Service: Orthopedics;  Laterality: Right;  . OPEN REDUCTION INTERNAL FIXATION (ORIF) DISTAL RADIAL FRACTURE Right 11/26/2017   Procedure: OPEN REDUCTION INTERNAL FIXATION (ORIF) DISTAL RADIAL FRACTURE;  Surgeon: Hessie Knows, MD;  Location: [facility];  Service: Orthopedics;  Laterality: Right;  . TONSILLECTOMY    . WRIST SURGERY Right 02/18/2018    There were no vitals filed for this visit.  Subjective Assessment - 04/03/18 0835    Subjective   Patient reports this week has not been great, numbness and tingling has been more constant,  increased pain.  Thumb clicking, popping but her ulnar pain is also back.  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  Yes    Pain Score  4     Pain Location  Wrist    Pain Orientation  Right    Pain Descriptors / Indicators  Aching;Sore    Pain Onset  1 to 4 weeks ago    Pain Frequency  Constant           Activity analysis to determine any patterns that have contributed to increased pain and edema.  Patient reports she has been using the arm and hand more, she did lift a large tide bottle and had immediate pain, also picked up her 74 yo daughter when she fell asleep to put her to bed. Discussed how these things likely contributed to current issues.  Patient will need to focus on decreasing pain, decreasing edema and modifying activity patterns.   Contrast for increased pain and edema scar massage performed followed by mobs Used graston tool nr 2 for volar forearm brushing   AAROM by OT  2  lbs  weight for sup and pron - 10 reps 2 sets PROM for UD - 10 reps  2lbs for RD, UD over armrest -stretch into UD 10 reps  PROM for wrist extension - table slides this date   Grip strength right 15# Ice at end at home if needed - for CTS   or can do contrast in the am                 OT Education - 04/03/18 0838    Education provided  Yes    Education Details  activity and use of arm/hand, lifting    Person(s) Educated  Patient    Methods  Explanation    Comprehension  Verbalized understanding;Returned demonstration       OT Short Term Goals - 02/15/18 1812      OT SHORT TERM GOAL #1   Title  Pt R hand digits able to make full fist and touch palm , and extend digits to reach with hand in pocket     Status  Achieved      OT [place] #2   Title   R wrist AROM improve for pt to  maintain midline to use hand in ADL's with more ease     Status  Achieved      OT SHORT TERM GOAL #3   Title  Pain on PRWHE improve in R hand and wrist by more than 15 points     Baseline  at eval pain on PRHWE is 34/50 - improved but more soreness and pain only with ext and sup     Time  3    Period  Weeks    Status  On-going    Target Date  03/08/18        OT Long Term Goals - 02/15/18 1812      OT LONG TERM GOAL #1   Title  R wrist AROM improve with more than 10-30 degrees in all planes to use R hand in more than 50% of ADL's     Status  Achieved      OT LONG TERM GOAL #2   Title  R grip strength increase to more than 50% compare to L hand to carry and hold objects at home and school     Baseline  Grip R 25, L 58 lbs     Time  4    Period  Weeks    Status  On-going    Target Date  03/15/18      OT LONG TERM GOAL #3   Title  Function score on PRWHE imprrove with more than 25 points     Baseline  Function score at eval 47/50  - improved - but compensate secondary to lacking ext, sup, UD    Time  4    Period  Weeks    Status  On-going    Target Date  03/15/18      OT LONG TERM GOAL #4   Title  Pt. will increase RUE strength by 2 muscle grades for ADL /IADL tasks.    Baseline  RUE 4/5- wrist in all planes -     Time  6    Period  Weeks    Status  On-going    Target Date  03/29/18      OT LONG TERM GOAL #5   Title  Pt. will increase the awareness of her RUE and hand by using compensatory strategies 100% of the time  Baseline  impaired proprioceptive and kinesthetic awareness of RUE and hand.- because of decrease AROM     Time  6    Period  Weeks    Status  On-going    Target Date  03/29/18      OT LONG TERM GOAL #6   Title  Pt. will demonstrate visual compensatory strategies 100% of the time during ADL/IADLs within her environment.     Status  Achieved            Plan - 04/03/18 1829    Clinical Impression Statement  Patient with increased pain and  edema this week and reports it has not been a great week.  Activity analysis to determine any patterns that have contributed to increased pain and edema.  Patient reports she has been using the arm and hand more, she did lift a large tide bottle and had immediate pain, also picked up her 88 yo daughter when she fell asleep to put her to bed. Discussed how these things likely contributed to current issues.  Patient will need to focus on decreasing pain, decreasing edema and modifying activity patterns.  Patient to borrow tool for supin/pro and continue with exercises this week.    Grip strength decreased this week with measurements.  Will plan to see 1x a week and continue to monitor and address pain.    Occupational performance deficits (Please refer to evaluation for details):  ADL's;IADL's;Work;Play;Leisure;Social Participation    Rehab Potential  Fair    Current Impairments/barriers affecting progress:  Positive indicators: family support, motivation, age Negative indicators: multiple comorbidities- hardware removal - 3 months s/p ORIF     OT Frequency  2x / week    OT Duration  4 weeks    OT Treatment/Interventions  Self-care/ADL training;Therapeutic exercise;Moist Heat;Splinting;Patient/family education;Scar mobilization;Fluidtherapy;Cryotherapy;Contrast Bath;Manual Therapy;Passive range of motion    Consulted and Agree with Plan of Care  Patient       Patient will benefit from skilled therapeutic intervention in order to improve the following deficits and impairments:  Pain, Impaired flexibility, Increased edema, Decreased coordination, Impaired sensation, Decreased scar mobility, Decreased strength, Decreased range of motion, Impaired UE functional use, Decreased knowledge of precautions  Visit Diagnosis: Pain in right hand  Pain in right wrist  Muscle weakness (generalized)  Stiffness of right wrist, not elsewhere classified  Stiffness of right hand, not elsewhere  classified    Problem List Patient Active Problem List   Diagnosis Date Noted  . TIA (transient ischemic attack) 10/12/2015  . Pregnancy, supervision of, high-risk 06/27/2011  . Lupus ([place]) 06/27/2011  . 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 Vaginal bleeding in pregnancy 06/27/2011  . Vaginal lesion 06/27/2011   Carlicia Leavens T Suda Forbess, OTR/L, CLT  Kyren Knick 04/03/2018, 8:55 AM  [facility] [address], [place], [address] Phone: [phone]   Fax:  [phone]  Name: CALEYAH JR MRN: [id number] Date of Birth: [birth year]

## 2018-04-06 ENCOUNTER — Ambulatory Visit: Payer: BC Managed Care – PPO | Admitting: Nurse Practitioner

## 2018-04-06 ENCOUNTER — Encounter: Payer: Self-pay | Admitting: Nurse Practitioner

## 2018-04-06 VITALS — BP 114/75 | HR 74 | Resp 16 | Ht 69.0 in | Wt 216.8 lb

## 2018-04-06 DIAGNOSIS — M329 Systemic lupus erythematosus, unspecified: Secondary | ICD-10-CM | POA: Diagnosis not present

## 2018-04-06 DIAGNOSIS — F411 Generalized anxiety disorder: Secondary | ICD-10-CM

## 2018-04-06 DIAGNOSIS — I10 Essential (primary) hypertension: Secondary | ICD-10-CM

## 2018-04-06 DIAGNOSIS — F321 Major depressive disorder, single episode, moderate: Secondary | ICD-10-CM | POA: Diagnosis not present

## 2018-04-06 MED ORDER — FLUOXETINE HCL 10 MG PO TABS: 10.0000 mg | ORAL_TABLET | Freq: Every day | ORAL | 3 refills | Status: DC

## 2018-04-06 MED ORDER — ALPRAZOLAM 0.25 MG PO TABS: 0.2500 mg | ORAL_TABLET | Freq: Two times a day (BID) | ORAL | 2 refills | Status: DC | PRN
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
## 2018-04-07 ENCOUNTER — Ambulatory Visit: Payer: BC Managed Care – PPO | Admitting: Podiatry

## 2018-04-07 ENCOUNTER — Ambulatory Visit (INDEPENDENT_AMBULATORY_CARE_PROVIDER_SITE_OTHER): Payer: BC Managed Care – PPO

## 2018-04-07 ENCOUNTER — Encounter: Payer: Self-pay | Admitting: Podiatry

## 2018-04-07 ENCOUNTER — Ambulatory Visit: Payer: BC Managed Care – PPO | Admitting: Occupational Therapy

## 2018-04-07 VITALS — BP 113/72 | HR 71 | Resp 16

## 2018-04-07 DIAGNOSIS — M216X1 Other acquired deformities of right foot: Secondary | ICD-10-CM

## 2018-04-07 DIAGNOSIS — M722 Plantar fascial fibromatosis: Secondary | ICD-10-CM | POA: Diagnosis not present

## 2018-04-07 DIAGNOSIS — Q828 Other specified congenital malformations of skin: Secondary | ICD-10-CM | POA: Diagnosis not present

## 2018-04-07 MED ORDER — METHYLPREDNISOLONE 4 MG PO TBPK: ORAL_TABLET | ORAL | 0 refills | Status: DC

## 2018-04-07 NOTE — Patient Instructions (Signed)

## 2018-04-07 NOTE — Progress Notes (Signed)
Subjective:  Patient ID: OREOLUWA GILMER, female    DOB: 06-Sep-1976,  MRN: [id number] HPI Chief Complaint  Patient presents with  . Foot Pain    Plantar arch left - aching x several months, AM pain, tried multiple shoes  . Foot Pain    Sub 1st , 4th, 5th MPJ right - callused areas x years, usually gets shaved during pedicures, hurts to walk  . New Patient (Initial Visit)    42 y.o. female presents with the above complaint.   ROS: Denies fever chills nausea vomiting muscle aches pains calf pain back pain chest pain shortness of breath.  History of lupus.  Teaches school.  Past Medical History:  Diagnosis Date  . Anxiety   . Connective tissue disorder (HCC)    undifferentiated  . Fibromyalgia   . Hypertension   . Lupus ([place])   . Migraine   . Seasonal allergic rhinitis    Past Surgical History:  Procedure Laterality Date  . CHOLECYSTECTOMY  2012  . DILATION AND CURETTAGE OF UTERUS  2011  . DILATION AND CURETTAGE OF UTERUS  01/17/2012   Procedure: DILATATION AND CURETTAGE;  Surgeon: Marylynn Pearson, MD;  Location: [facility];  Service: Gynecology;  Laterality: N/A;  Dilatation and curratage, insertion of Bakri Balloon  . FOOT SURGERY    . HARDWARE REMOVAL Right 02/18/2018   Procedure: HARDWARE REMOVAL RIGHT WRIST;  Surgeon: Hessie Knows, MD;  Location: [facility];  Service: Orthopedics;  Laterality: Right;  . OPEN REDUCTION INTERNAL FIXATION (ORIF) DISTAL RADIAL FRACTURE Right 11/26/2017   Procedure: OPEN REDUCTION INTERNAL FIXATION (ORIF) DISTAL RADIAL FRACTURE;  Surgeon: Hessie Knows, MD;  Location: [facility];  Service: Orthopedics;  Laterality: Right;  . TONSILLECTOMY    . WRIST SURGERY Right 02/18/2018    Current Outpatient Medications:  .  acetaminophen (TYLENOL) 500 MG tablet, Take 1,000 mg by mouth every 8 (eight) hours as needed for headache. , Disp: , Rfl:  .  ALPRAZolam (XANAX) 0.25 MG tablet, Take 1 tablet (0.25 mg total) by mouth 2 (two) times daily as needed for  anxiety., Disp: 45 tablet, Rfl: 2 .  Azelastine HCl 0.15 % SOLN, INL 2 PFS IEN QD, Disp: , Rfl: 12 .  cetirizine (ZYRTEC) 10 MG tablet, Take 10 mg by mouth daily. , Disp: , Rfl: 12 .  EPIPEN 2-PAK 0.3 MG/0.3ML SOAJ injection, INJECT INTRAMUSCULARLY AS DIRECTED FOR ANAPHYLATIC REACTIONS, Disp: , Rfl: 0 .  fexofenadine (ALLEGRA ALLERGY) 180 MG tablet, Take 180 mg by mouth daily., Disp: , Rfl:  .  FLUoxetine (PROZAC) 10 MG tablet, Take 1 tablet (10 mg total) by mouth daily., Disp: 30 tablet, Rfl: 3 .  fluticasone (FLONASE) 50 MCG/ACT nasal spray, Place 2 sprays into both nostrils daily as needed for allergies., Disp: , Rfl:  .  gabapentin (NEURONTIN) 100 MG capsule, , Disp: , Rfl: 0 .  hydrochlorothiazide (HYDRODIURIL) 25 MG tablet, Take 1 tablet (25 mg total) by mouth daily., Disp: 30 tablet, Rfl: 5 .  HYDROcodone-acetaminophen (NORCO/VICODIN) 5-325 MG tablet, TK 1 T PO Q 6 H PRF PAIN, Disp: , Rfl: 0 .  hydroxychloroquine (PLAQUENIL) 200 MG tablet, Take 200 mg by mouth 2 (two) times daily.  , Disp: , Rfl:  .  losartan (COZAAR) 50 MG tablet, Take 1 tablet (50 mg total) by mouth daily., Disp: 30 tablet, Rfl: 3 .  methylPREDNISolone (MEDROL DOSEPAK) 4 MG TBPK tablet, 6 day dose pack - take as directed, Disp: 21 tablet, Rfl: 0 .  mometasone (ELOCON)  0.1 % lotion, APP TO ITCHY EARS BID FOR 12 DAYS. MAY REPEAT PRF ITCHY EARS., Disp: , Rfl: 4 .  ondansetron (ZOFRAN-ODT) 4 MG disintegrating tablet, Take 1 tablet (4 mg total) by mouth every 8 (eight) hours as needed for nausea or vomiting., Disp: 20 tablet, Rfl: 0 .  traMADol (ULTRAM) 50 MG tablet, , Disp: , Rfl: 1  Allergies  Allergen Reactions  . Ibuprofen Swelling    Lips swell  . Bean Pod Extract Hives  . Corn-Containing Products Hives  . Peanut-Containing Drug Products Hives   Review of Systems Objective:   Vitals:   04/07/18 1517  BP: 113/72  Pulse: 71  Resp: 16    General: Well developed, nourished, in no acute distress, alert and  oriented x3   Dermatological: Skin is warm, dry and supple bilateral. Nails x 10 are well maintained; remaining integument appears unremarkable at this time. There are no open sores, no preulcerative lesions, no rash or signs of infection present.  Reactive hyper keratomas plantar aspect of the bilateral forefoot no open lesions or wounds.  Vascular: Dorsalis Pedis artery and Posterior Tibial artery pedal pulses are 2/4 bilateral with immedate capillary fill time. Pedal hair growth present. No varicosities and no lower extremity edema present bilateral.   Neruologic: Grossly intact via light touch bilateral. Vibratory intact via tuning fork bilateral. Protective threshold with Semmes Wienstein monofilament intact to all pedal sites bilateral. Patellar and Achilles deep tendon reflexes 2+ bilateral. No Babinski or clonus noted bilateral.   Musculoskeletal: No gross boney pedal deformities bilateral. No pain, crepitus, or limitation noted with foot and ankle range of motion bilateral. Muscular strength 5/5 in all groups tested bilateral.  Pain on palpation medial calcaneal tubercle of the left heel no pain on medial and lateral compression of the calcaneus.  Gait: Unassisted, Nonantalgic.    Radiographs:  Radiographs taken today demonstrate soft tissue increase in density plantar fashion calcaneal insertion site of bilateral possible flatfeet.  Assessment & Plan:   Assessment: Plantar fasciitis left.  Reactive hyper keratomas forefoot bilateral.  Pes planus bilateral.    Plan: Discussed etiology pathology conservative versus surgical therapies.  At this point after Betadine skin prep I injected 20 mg of Kenalog 5 mg Marcaine to her left heel at the plantar fashion calcaneal insertion site.  Tolerated procedure well without comp occasions.  Placed her in a plantar fascial brace and a night splint.  Discussed appropriate shoe gear stretching exercise ice therapy sugar modifications.  Start her on a  Medrol Dosepak and will not start her on meloxicam because of her allergy to NSAIDs.  Debrided reactive hyper keratomas bilateral forefoot.  Schedule her to follow-up with Liliane Channel for orthotics.  Basically needs a standard orthotic and cut out beneath each metatarsal to allow for the metatarsal head.     Aubrina Nieman T. [place], [place]

## 2018-04-09 ENCOUNTER — Ambulatory Visit (INDEPENDENT_AMBULATORY_CARE_PROVIDER_SITE_OTHER): Payer: BC Managed Care – PPO | Admitting: Orthotics

## 2018-04-09 DIAGNOSIS — M722 Plantar fascial fibromatosis: Secondary | ICD-10-CM

## 2018-04-09 DIAGNOSIS — Q828 Other specified congenital malformations of skin: Secondary | ICD-10-CM

## 2018-04-09 DIAGNOSIS — M216X1 Other acquired deformities of right foot: Secondary | ICD-10-CM

## 2018-04-09 NOTE — Progress Notes (Signed)
Patient came into today for casting bilateral f/o to address plantar fasciitis.  Patient reports history of foot pain involving plantar aponeurosis.  Goal is to provide longitudinal arch support and correct any RF instability due to heel eversion/inversion.  Ultimate goal is to relieve tension at pf insertion calcaneal tuberosity.  Plan on semi-rigid device addressing heel stability and relieving PF tension.     Also cover in P-cell to help relieve callus areas.

## 2018-04-12 ENCOUNTER — Ambulatory Visit: Payer: BC Managed Care – PPO | Attending: Orthopedic Surgery | Admitting: Occupational Therapy

## 2018-04-12 DIAGNOSIS — M6281 Muscle weakness (generalized): Secondary | ICD-10-CM | POA: Insufficient documentation

## 2018-04-12 DIAGNOSIS — M25531 Pain in right wrist: Secondary | ICD-10-CM | POA: Insufficient documentation

## 2018-04-12 DIAGNOSIS — M25631 Stiffness of right wrist, not elsewhere classified: Secondary | ICD-10-CM | POA: Insufficient documentation

## 2018-04-12 DIAGNOSIS — M79641 Pain in right hand: Secondary | ICD-10-CM | POA: Insufficient documentation

## 2018-04-12 DIAGNOSIS — M25641 Stiffness of right hand, not elsewhere classified: Secondary | ICD-10-CM | POA: Insufficient documentation

## 2018-04-22 ENCOUNTER — Ambulatory Visit: Payer: BC Managed Care – PPO | Admitting: Occupational Therapy

## 2018-04-26 ENCOUNTER — Ambulatory Visit: Payer: BC Managed Care – PPO | Admitting: Occupational Therapy

## 2018-04-26 DIAGNOSIS — M25631 Stiffness of right wrist, not elsewhere classified: Secondary | ICD-10-CM

## 2018-04-26 DIAGNOSIS — M25531 Pain in right wrist: Secondary | ICD-10-CM

## 2018-04-26 DIAGNOSIS — M25641 Stiffness of right hand, not elsewhere classified: Secondary | ICD-10-CM | POA: Diagnosis present

## 2018-04-26 DIAGNOSIS — M6281 Muscle weakness (generalized): Secondary | ICD-10-CM

## 2018-04-26 DIAGNOSIS — M79641 Pain in right hand: Secondary | ICD-10-CM

## 2018-04-26 NOTE — Therapy (Signed)
[facility] [address] [place], [address] Phone: [phone]   Fax:  [phone]  Occupational Therapy Treatment  Patient Details  Name: Linda Vargas MRN: [id number] Date of Birth: 06/18/1976 Referring Provider: Arvella Nigh    Encounter Date: 04/26/2018  OT End of Session - 04/26/18 1643    Visit Number  3    Number of Visits  28    Date for OT Re-Evaluation  04/08/18    OT Start Time  1503    OT Stop Time  1557    OT Time Calculation (min)  54 min    Activity Tolerance  Patient tolerated treatment well    Behavior During Therapy  [facility] for tasks assessed/performed       Past Medical History:  Diagnosis Date  . Anxiety   . Connective tissue disorder (HCC)    undifferentiated  . Fibromyalgia   . Hypertension   . Lupus ([place])   . Migraine   . Seasonal allergic rhinitis     Past Surgical History:  Procedure Laterality Date  . CHOLECYSTECTOMY  2012  . DILATION AND CURETTAGE OF UTERUS  2011  . DILATION AND CURETTAGE OF UTERUS  01/17/2012   Procedure: DILATATION AND CURETTAGE;  Surgeon: Marylynn Pearson, MD;  Location: [facility];  Service: Gynecology;  Laterality: N/A;  Dilatation and curratage, insertion of Bakri Balloon  . FOOT SURGERY    . HARDWARE REMOVAL Right 02/18/2018   Procedure: HARDWARE REMOVAL RIGHT WRIST;  Surgeon: Hessie Knows, MD;  Location: [facility];  Service: Orthopedics;  Laterality: Right;  . OPEN REDUCTION INTERNAL FIXATION (ORIF) DISTAL RADIAL FRACTURE Right 11/26/2017   Procedure: OPEN REDUCTION INTERNAL FIXATION (ORIF) DISTAL RADIAL FRACTURE;  Surgeon: Hessie Knows, MD;  Location: [facility];  Service: Orthopedics;  Laterality: Right;  . TONSILLECTOMY    . WRIST SURGERY Right 02/18/2018    There were no vitals filed for this visit.  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  Yes    Pain Score  3     Pain Location  Wrist    Pain Orientation  Right    Pain Descriptors / Indicators  Aching;Numbness    Pain Type  Acute pain;Surgical pain    Pain Onset  More than a month ago    Pain Frequency  Constant         [facility] OT Assessment - 04/26/18 0001      AROM   Right Forearm Supination  60 Degrees PROM 80    Right Wrist Extension  50 Degrees    Right Wrist Flexion  60 Degrees    Right Wrist Radial Deviation  27 Degrees    Right Wrist Ulnar Deviation  8 Degrees      Strength   Right  Hand Grip (lbs)  20    Right Hand Lateral Pinch  11 lbs    Right Hand 3 Point Pinch  6 lbs    Left Hand Grip (lbs)  58    Left Hand Lateral Pinch  15 lbs    Left Hand 3 Point Pinch  11 lbs      Discuss with pt pain , CTS, trigger thumb - tender over A1pulley  But no triggering in clinic - Fisting WNL and Opposition WNL  AROM for wrist assess  no popping or clicking at ulnar head during sup /pro - tight  Grip decrease - increase pain    Assess use of laptop and writing   pt to get Penagain - pt use in clinic with great ease  And to get silicon wrist support for laptop to avoid pressure over CT and wrist extention  Discuss modifications with lifting , use of R hand returning to work          OT Treatments/Exercises (OP) - 04/26/18 0001      RUE Paraffin   Number Minutes Paraffin  10 Minutes    RUE Paraffin Location  -- forearm and hand     Comments  PROM sup and pronation 30 sec - with heatingpad       Graston tool nr 2-  sweeping and brushing over volar wrist and RD wrist - over 1st dorsal compartment  Also some gentle traction and joint mobs to distal ulna and radius prior to PROM sup    PROM for sup and UD - stretch done in clinic  To cont with Sup wheel  2 lbs weight for wrist sup - attempted 3 lbs but increase pain   cont prayer stretch and 2 lbs for wrist ext, UD - avoid flexion of wrist   kinesiotape for scar done X over distal and proximal scar - replace every day at home       OT Education - 04/26/18 1642    Education provided  Yes    Education Details  progress, findings for reassessment - and upgrade and changes to HEP     Person(s) Educated  Patient    Methods  Explanation;Demonstration    Comprehension  Verbalized understanding;Returned demonstration       OT Short Term Goals - 04/26/18 1651      OT SHORT TERM GOAL #1   Title  Pt R hand digits able to make full fist and touch palm , and extend digits to reach with hand in pocket     Status  Achieved      OT SHORT TERM GOAL #2   Title   R wrist AROM improve for pt to maintain midline to use hand in ADL's with more ease     Status  Achieved      OT SHORT TERM GOAL #3   Title  Pain on PRWHE improve in R hand and wrist by more than 15 points     Baseline  at eval pain on PRHWE is 34/50 - pain did improve but increase again the last few wks - CTS-  since getting ready for school - computer and writing  now 35/50    Time  4    Period  Weeks    Status  On-going    Target Date  05/24/18        OT Long Term Goals - 04/26/18 1653      OT LONG TERM GOAL #1   Title  R wrist AROM improve with more than 10-30 degrees in all planes to use R hand in more than 50% of ADL's     Status  Achieved      OT LONG TERM GOAL #2   Title  R grip strength increase to more than 50% compare to L hand to carry and hold objects at home and school     Baseline  Grip R 20, L 58 lbs  - grip decrease since CTS increase     Time  4    Period  Weeks     Status  On-going    Target Date  05/24/18      OT LONG TERM GOAL #3   Title  Function score on PRWHE imprrove with more than 25 points     Baseline  Function score at eval 47/50  - improved  to 31/50 - limited by CTS , pain and numbness     Time  4    Period  Weeks    Status  On-going    Target Date  05/24/18      OT LONG TERM GOAL #4   Title  Pt. will increase RUE strength by 2 muscle grades for ADL /IADL tasks.    Baseline  RUE 4/5- wrist in all planes -     Time  4    Period  Weeks    Status  On-going    Target Date  05/24/18      OT LONG TERM GOAL #5   Title  Pt. will increase the awareness of her RUE and hand by using compensatory strategies 100% of the time     Baseline  impaired proprioceptive and kinesthetic awareness of RUE and hand.- because of decrease AROM     Status  Achieved            Plan - 04/26/18 1643    Clinical Impression Statement  Pt seen this week 8 wks since last surgery to remove hardware - pt was seen about 3 wks ago - compare to then - pt showed increase wrist extention , no clicking or popping at ulna head-  increase use  - but pt show increase CTS - increase numbness, pain at wrist , thumb  thru 4th digits- with some trigger thumb symptoms - pt schedule for nerve conduction first week of Aug and pt to focus until then on sup - UD , and increase strenght - without increasing CTS        Occupational performance deficits (Please refer to evaluation for details):  ADL's;IADL's;Work;Play;Leisure;Social Participation    Rehab Potential  Fair    Current Impairments/barriers affecting progress:  Positive indicators: family support, motivation, age Negative indicators: multiple comorbidities- hardware removal - 3 months s/p ORIF     OT Frequency  -- 1-2 x wk depending on her schedule     OT Duration  4 weeks    OT Treatment/Interventions  Self-care/ADL training;Therapeutic exercise;Moist Heat;Splinting;Patient/family education;Scar  mobilization;Fluidtherapy;Cryotherapy;Contrast Bath;Manual Therapy;Passive range of motion    Plan  increase sup/UD  - PROM , stretch - and strength without increase CTS     Clinical Decision Making  Several treatment options, min-mod task modification necessary    OT Home Exercise Plan  see pt instruction     Consulted and Agree with Plan of Care  Patient       Patient will benefit from skilled therapeutic intervention in order to improve the following deficits and impairments:  Pain, Impaired flexibility, Increased  edema, Decreased coordination, Impaired sensation, Decreased scar mobility, Decreased strength, Decreased range of motion, Impaired UE functional use, Decreased knowledge of precautions  Visit Diagnosis: Pain in right hand - Plan: Ot plan of care cert/re-cert  Pain in right wrist - Plan: Ot plan of care cert/re-cert  Muscle weakness (generalized) - Plan: Ot plan of care cert/re-cert  Stiffness of right wrist, not elsewhere classified - Plan: Ot plan of care cert/re-cert    Problem List Patient Active Problem List   Diagnosis Date Noted  . Essential hypertension 04/06/2018  . Generalized anxiety disorder 04/06/2018  . Depression, major, single episode, moderate ([place]) 04/06/2018  . Depression 01/21/2017  . Contusion of right foot 01/19/2017  . Intractable migraine with aura without status migrainosus 12/12/2016  . TIA (transient ischemic attack) 10/12/2015  . Long-term use of Plaquenil 07/21/2014  . Fibromyalgia 11/12/2012  . Undifferentiated connective tissue disease ([place]) 09/25/2011  . Pregnancy, supervision of, high-risk 06/27/2011  . Lupus ([place]) 06/27/2011  . 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 Vaginal bleeding in pregnancy 06/27/2011  . Vaginal lesion 06/27/2011    Rosalyn Gess OTR/L,CLT 04/26/2018, 4:59 PM  [facility] [address] [place], [address] Phone: [phone]   Fax:   [phone]  Name: KALSEY LULL MRN: [id number] Date of Birth: Sep 07, 1976

## 2018-04-26 NOTE — Patient Instructions (Signed)
Paraffin  PROM for sup and UD - stretch  Sup wheel  2 lbs weight for wrist sup   cont prayer stretch and 2 lbs for wrist ext, UD -   kinesiotape for scar - replace every day

## 2018-04-29 ENCOUNTER — Ambulatory Visit: Payer: BC Managed Care – PPO | Admitting: Occupational Therapy

## 2018-04-29 DIAGNOSIS — M6281 Muscle weakness (generalized): Secondary | ICD-10-CM

## 2018-04-29 DIAGNOSIS — M79641 Pain in right hand: Secondary | ICD-10-CM

## 2018-04-29 DIAGNOSIS — M25531 Pain in right wrist: Secondary | ICD-10-CM

## 2018-04-29 DIAGNOSIS — M25641 Stiffness of right hand, not elsewhere classified: Secondary | ICD-10-CM

## 2018-04-29 DIAGNOSIS — M25631 Stiffness of right wrist, not elsewhere classified: Secondary | ICD-10-CM

## 2018-04-29 NOTE — Therapy (Signed)
[facility] [address], [place], [address] Phone: [phone]   Fax:  [phone]  Occupational Therapy Treatment  Patient Details  Name: Linda Vargas MRN: [id number] Date of Birth: 07-Feb-1976 Referring Provider: Arvella Nigh    Encounter Date: 04/29/2018  OT End of Session - 04/29/18 0948    Visit Number  4    Number of Visits  28    Date for OT Re-Evaluation  04/08/18    OT Start Time  0807    OT Stop Time  0845    OT Time Calculation (min)  38 min    Activity Tolerance  Patient tolerated treatment well    Behavior During Therapy  [facility] for tasks assessed/performed       Past Medical History:  Diagnosis Date  . Anxiety   . Connective tissue disorder (HCC)    undifferentiated  . Fibromyalgia   . Hypertension   . Lupus ([place])   . Migraine   . Seasonal allergic rhinitis     Past Surgical History:  Procedure Laterality Date  . CHOLECYSTECTOMY  2012  . DILATION AND CURETTAGE OF UTERUS  2011  . DILATION AND CURETTAGE OF UTERUS  01/17/2012   Procedure: DILATATION AND CURETTAGE;  Surgeon: Marylynn Pearson, MD;  Location: [facility];  Service: Gynecology;  Laterality: N/A;  Dilatation and curratage, insertion of Bakri Balloon  . FOOT SURGERY    . HARDWARE REMOVAL Right 02/18/2018   Procedure: HARDWARE REMOVAL RIGHT WRIST;  Surgeon: Hessie Knows, MD;  Location: [facility];  Service: Orthopedics;  Laterality: Right;  . OPEN REDUCTION INTERNAL FIXATION (ORIF) DISTAL RADIAL FRACTURE Right 11/26/2017   Procedure: OPEN REDUCTION INTERNAL FIXATION (ORIF) DISTAL RADIAL FRACTURE;  Surgeon: Hessie Knows, MD;  Location: [facility];  Service: Orthopedics;  Laterality: Right;  . TONSILLECTOMY    . WRIST SURGERY Right 02/18/2018    There were no vitals filed for this visit.  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  Yes    Pain Score  5     Pain Location  Wrist    Pain Orientation  Right    Pain Descriptors / Indicators  Aching;Numbness    Pain Type  Acute pain    Pain Onset  More than a month ago         [facility] OT Assessment - 04/29/18 0001      AROM   Right Forearm Supination  75 Degrees               OT Treatments/Exercises (OP) - 04/29/18 0001      RUE Paraffin   Number Minutes Paraffin  10 Minutes    RUE Paraffin Location  Wrist    Comments  PROM for sup - 30 sec alteranate to pronation      Graston tool nr 2- sweeping and brushing over volar wrist and RD wrist - over 1st dorsal compartment  Also some gentle traction and joint mobs to distal ulna and radius prior  to PROM sup   PROM for sup and UD - stretch done in clinic   Sup wheel 20 reps  85 degrees in clinic  2 lbs weight for wrist sup 10 reps unsupported     cont prayer stretch and 2 lbs for wrist ext, UD - avoid flexion of wrist         OT Education - 04/29/18 0947    Education provided  Yes    Education Details  HEP to focus on while back to work this week     Person(s) Educated  Patient    Methods  Explanation;Demonstration    Comprehension  Verbalized understanding;Returned demonstration       OT Short Term Goals - 04/26/18 1651      OT SHORT TERM GOAL #1   Title  Pt R hand digits able to make full fist and touch palm , and extend digits to reach with hand in pocket     Status  Achieved      OT SHORT TERM GOAL #2   Title   R wrist AROM improve for pt to maintain midline to use hand in ADL's with more ease     Status  Achieved      OT SHORT TERM GOAL #3   Title  Pain on PRWHE improve in R hand and wrist by more than 15 points     Baseline  at eval pain  on PRHWE is 34/50 - pain did improve but increase again the last few wks - CTS-  since getting ready for school - computer and writing  now 35/50    Time  4    Period  Weeks    Status  On-going    Target Date  05/24/18        OT Long Term Goals - 04/26/18 1653      OT LONG TERM GOAL #1   Title  R wrist AROM improve with more than 10-30 degrees in all planes to use R hand in more than 50% of ADL's     Status  Achieved      OT LONG TERM GOAL #2   Title  R grip strength increase to more than 50% compare to L hand to carry and hold objects at home and school     Baseline  Grip R 20, L 58 lbs  - grip decrease since CTS increase     Time  4    Period  Weeks    Status  On-going    Target Date  05/24/18      OT LONG TERM GOAL #3   Title  Function score on PRWHE imprrove with more than 25 points     Baseline  Function score at eval 47/50  - improved  to 31/50 - limited by CTS , pain and numbness     Time  4    Period  Weeks    Status  On-going    Target Date  05/24/18      OT LONG TERM GOAL #4   Title  Pt. will increase RUE strength by 2 muscle grades for ADL /IADL tasks.    Baseline  RUE 4/5- wrist in all planes -     Time  4    Period  Weeks    Status  On-going    Target Date  05/24/18      OT LONG TERM GOAL #5   Title  Pt. will increase the awareness of her RUE  and hand by using compensatory strategies 100% of the time     Baseline  impaired proprioceptive and kinesthetic awareness of RUE and hand.- because of decrease AROM     Status  Achieved            Plan - 04/29/18 0948    Clinical Impression Statement  Pt show increase supination this am to 75 degrees with no popping or clicking - but pain was increase to 5/10 - but think it is her CTS and being back to work this week - was able to increase to 85 degrees of sup in clinic this am with pain less than 2/10 - pt to focus on sup , UD  and keep pain down     Occupational performance deficits (Please refer to evaluation  for details):  ADL's;IADL's;Work;Play;Leisure;Social Participation    Rehab Potential  Fair    Current Impairments/barriers affecting progress:  Positive indicators: family support, motivation, age Negative indicators: multiple comorbidities- hardware removal - 3 months s/p ORIF     OT Frequency  2x / week    OT Duration  4 weeks    OT Treatment/Interventions  Self-care/ADL training;Therapeutic exercise;Moist Heat;Splinting;Patient/family education;Scar mobilization;Fluidtherapy;Cryotherapy;Contrast Bath;Manual Therapy;Passive range of motion    Plan  increase sup/UD  - PROM , stretch - and strength without increase CTS     Clinical Decision Making  Several treatment options, min-mod task modification necessary    OT Home Exercise Plan  see pt instruction     Consulted and Agree with Plan of Care  Patient       Patient will benefit from skilled therapeutic intervention in order to improve the following deficits and impairments:  Pain, Impaired flexibility, Increased edema, Decreased coordination, Impaired sensation, Decreased scar mobility, Decreased strength, Decreased range of motion, Impaired UE functional use, Decreased knowledge of precautions  Visit Diagnosis: Pain in right hand  Pain in right wrist  Muscle weakness (generalized)  Stiffness of right wrist, not elsewhere classified  Stiffness of right hand, not elsewhere classified    Problem List Patient Active Problem List   Diagnosis Date Noted  . Essential hypertension 04/06/2018  . Generalized anxiety disorder 04/06/2018  . Depression, major, single episode, moderate ([place]) 04/06/2018  . Depression 01/21/2017  . Contusion of right foot 01/19/2017  . Intractable migraine with aura without status migrainosus 12/12/2016  . TIA (transient ischemic attack) 10/12/2015  . Long-term use of Plaquenil 07/21/2014  . Fibromyalgia 11/12/2012  . Undifferentiated connective tissue disease ([place]) 09/25/2011  . Pregnancy, supervision  of, high-risk 06/27/2011  . Lupus ([place]) 06/27/2011  . 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 Vaginal bleeding in pregnancy 06/27/2011  . Vaginal lesion 06/27/2011    Rosalyn Gess OTR/L,CLT 04/29/2018, 9:50 AM  [facility] [address] [place], [address] Phone: [phone]   Fax:  [phone]  Name: Linda Vargas MRN: [id number] Date of Birth: May 01, 1976

## 2018-04-29 NOTE — Patient Instructions (Signed)
With increase act at work - if need to focus on Paraffin  PROM sup , supination wheel  2 lbs for sup  PROM prayer stretch  And PROM /stretch for RD  Keep pain 2/10 or less

## 2018-05-04 ENCOUNTER — Ambulatory Visit: Payer: BC Managed Care – PPO | Admitting: Occupational Therapy

## 2018-05-06 ENCOUNTER — Ambulatory Visit: Payer: BC Managed Care – PPO | Admitting: Occupational Therapy

## 2018-05-12 ENCOUNTER — Ambulatory Visit: Payer: BC Managed Care – PPO | Admitting: Podiatry

## 2018-05-12 ENCOUNTER — Other Ambulatory Visit: Payer: BC Managed Care – PPO | Admitting: Orthotics

## 2018-05-13 ENCOUNTER — Ambulatory Visit: Payer: BC Managed Care – PPO | Attending: Orthopedic Surgery | Admitting: Occupational Therapy

## 2018-05-13 DIAGNOSIS — M25631 Stiffness of right wrist, not elsewhere classified: Secondary | ICD-10-CM | POA: Insufficient documentation

## 2018-05-13 DIAGNOSIS — M25641 Stiffness of right hand, not elsewhere classified: Secondary | ICD-10-CM | POA: Insufficient documentation

## 2018-05-13 DIAGNOSIS — M25531 Pain in right wrist: Secondary | ICD-10-CM | POA: Diagnosis present

## 2018-05-13 DIAGNOSIS — M79641 Pain in right hand: Secondary | ICD-10-CM | POA: Diagnosis present

## 2018-05-13 DIAGNOSIS — M6281 Muscle weakness (generalized): Secondary | ICD-10-CM | POA: Diagnosis present

## 2018-05-13 NOTE — Therapy (Signed)
[facility] [address] [place], [address] Phone: [phone]   Fax:  [phone]  Occupational Therapy Treatment  Patient Details  Name: Linda Vargas MRN: [id number] Date of Birth: 10-26-1975 Referring Provider: Arvella Nigh    Encounter Date: 05/13/2018  OT End of Session - 05/13/18 1754    Visit Number  5    Number of Visits  28    Date for OT Re-Evaluation  07/08/18    OT Start Time  0086    OT Stop Time  1630    OT Time Calculation (min)  49 min    Activity Tolerance  Patient tolerated treatment well    Behavior During Therapy  [facility] for tasks assessed/performed       Past Medical History:  Diagnosis Date  . Anxiety   . Connective tissue disorder (HCC)    undifferentiated  . Fibromyalgia   . Hypertension   . Lupus ([place])   . Migraine   . Seasonal allergic rhinitis     Past Surgical History:  Procedure Laterality Date  . CHOLECYSTECTOMY  2012  . DILATION AND CURETTAGE OF UTERUS  2011  . DILATION AND CURETTAGE OF UTERUS  01/17/2012   Procedure: DILATATION AND CURETTAGE;  Surgeon: Marylynn Pearson, MD;  Location: [facility];  Service: Gynecology;  Laterality: N/A;  Dilatation and curratage, insertion of Bakri Balloon  . FOOT SURGERY    . HARDWARE REMOVAL Right 02/18/2018   Procedure: HARDWARE REMOVAL RIGHT WRIST;  Surgeon: Hessie Knows, MD;  Location: [facility];  Service: Orthopedics;  Laterality: Right;  . OPEN REDUCTION INTERNAL FIXATION (ORIF) DISTAL RADIAL FRACTURE Right 11/26/2017   Procedure: OPEN REDUCTION INTERNAL FIXATION (ORIF) DISTAL RADIAL FRACTURE;  Surgeon: Hessie Knows, MD;  Location: [facility];  Service: Orthopedics;  Laterality: Right;  . TONSILLECTOMY    . WRIST SURGERY Right 02/18/2018    There were no vitals filed for this visit.  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  Yes    Pain Score  5     Pain Location  Hand    Pain Descriptors / Indicators  Aching;Tingling;Throbbing    Pain Type  Surgical pain;Acute pain    Pain Onset  More than a month ago    Pain Frequency  Constant    Aggravating Factors   end of day , with increase use and night time          [facility] OT Assessment - 05/13/18 0001      AROM   Right Forearm Supination  75 Degrees    Right Wrist Extension  50 Degrees    Right Wrist  Flexion  60 Degrees      Strength   Right Hand Grip (lbs)  14    Right Hand Lateral Pinch  12 lbs    Right Hand 3 Point Pinch  6 lbs    Left Hand Grip (lbs)  58    Left Hand Lateral Pinch  15 lbs    Left Hand 3 Point Pinch  11 lbs      reassess AROM for R wrist   grip assess  PRWHE done Pain 36/50 , function 19/50  Improve in function because return to teaching 2wks ago  But pain still high  And pins and needles  Thumb PA and RA  Opposition all WNL but weak  Some atrophy in webspace   REviewed with pt her act at school  And findings of reassessment :  And review HEP :    Putty gripping only weekend  PROM or stretch for sup - but more at wrist , forearm ( not hand )  Supination wheel 20 reps  2 lbs weight 2 x 10 sup   Prayer stretch in shower  Wrist flexion stretch  RTB for scapula squeezes  Shoulder extention  Elbow extention 15 reps each   1 x day  Can increase in 3-4 days to 2 sets  And next week 3 sets                 OT Education -  05/13/18 1752    Education provided  Yes    Education Details  HEP updated     Person(s) Educated  Patient    Methods  Explanation;Demonstration    Comprehension  Returned demonstration;Verbalized understanding       OT Short Term Goals - 05/13/18 1801      OT SHORT TERM GOAL #1   Title  Pt R hand digits able to make full fist and touch palm , and extend digits to reach with hand in pocket     Status  Achieved      OT SHORT TERM GOAL #2   Status  Achieved      OT SHORT TERM GOAL #3   Title  Pain on PRWHE improve in R hand and wrist by more than 15 points     Baseline  at eval pain on PRHWE is 34/50 - pain did improve but  increase again the last few wks - CTS-  since getting ready for school and teaching now  - computer and writing  now 36/50    Time  4    Period  Weeks    Status  On-going    Target Date  06/10/18        OT Long Term Goals - 05/13/18 1802      OT LONG TERM GOAL #1   Title  R wrist AROM improve with more than 10-30 degrees in all planes to use R hand in more than 50% of ADL's     Baseline  see flowsheet for ROM ( sup and ext of wrist still impaired ) , use hand in mote than 50% of tasks     Time  4    Period  Weeks    Status  On-going    Target Date  06/10/18      OT LONG TERM GOAL #2   Title  R grip strength increase to more than 50% compare to L hand to carry and hold objects at home and school     Baseline  Grip R 20, L 58 lbs  - grip decrease since CTS increase - and told pt to hold off on putty while back to teaching     Time  8    Period  Weeks    Status  On-going    Target Date  07/08/18      OT LONG TERM GOAL #3   Title  Function score on PRWHE imprrove with more than 25 points     Baseline  Function score at eval 47/50  - improved  to 19/50    Time  6    Status  On-going    Target Date  06/24/18      OT LONG TERM GOAL #4   Title  Pt. will increase RUE strength by 2 muscle grades for ADL /IADL tasks.    Baseline  RUE 4/5- wrist in all  planes -     Time  4    Period  Weeks    Status  On-going    Target Date  06/10/18      OT LONG TERM GOAL #5   Title  Pt. will increase the awareness of her RUE and hand by using compensatory strategies 100% of the time     Status  Achieved            Plan - 05/13/18 1757    Clinical Impression Statement  Pt showed progress in function on PRWHE from 31/50 to 19/50 with returning to work - but pain still 36/50  - pt did not show progress in grip  , wrist ext and supination -but did not  go back - pt having nerve conduction on the 6th Aug and then suppost to see Dr Rudene Christians in 2 wks after test - did add some conditioning exercises  for shouder and elbow      Occupational performance deficits (Please refer to evaluation for details):  ADL's;IADL's;Work;Play;Leisure;Social Participation    Rehab Potential  Fair    Current Impairments/barriers affecting progress:  Positive indicators: family support, motivation, age Negative indicators: multiple comorbidities- hardware removal - 3 months s/p ORIF     OT Frequency  Biweekly    OT Duration  8 weeks    OT Treatment/Interventions  Self-care/ADL training;Therapeutic exercise;Moist Heat;Splinting;Patient/family education;Scar mobilization;Fluidtherapy;Cryotherapy;Contrast Bath;Manual Therapy;Passive range of motion    Plan  increase sup/UD  - PROM , stretch - and strength without increase CTS -     Clinical Decision Making  Several treatment options, min-mod task modification necessary    OT Home Exercise Plan  see pt instruction     Consulted and Agree with Plan of Care  Patient       Patient will benefit from skilled therapeutic intervention in order to improve the following deficits and impairments:  Pain, Impaired flexibility, Increased edema, Decreased coordination, Impaired sensation, Decreased scar mobility, Decreased strength, Decreased range of motion, Impaired UE functional use, Decreased knowledge of precautions  Visit Diagnosis: Pain  in right hand - Plan: Ot plan of care cert/re-cert  Pain in right wrist - Plan: Ot plan of care cert/re-cert  Muscle weakness (generalized) - Plan: Ot plan of care cert/re-cert  Stiffness of right wrist, not elsewhere classified - Plan: Ot plan of care cert/re-cert  Stiffness of right hand, not elsewhere classified - Plan: Ot plan of care cert/re-cert    Problem List Patient Active Problem List   Diagnosis Date Noted  . Essential hypertension 04/06/2018  . Generalized anxiety disorder 04/06/2018  . Depression, major, single episode,  moderate ([place]) 04/06/2018  . Depression 01/21/2017  . Contusion of right foot 01/19/2017  . Intractable migraine with aura without status migrainosus 12/12/2016  . TIA (transient ischemic attack) 10/12/2015  . Long-term use of Plaquenil 07/21/2014  . Fibromyalgia 11/12/2012  . Undifferentiated connective tissue disease ([place]) 09/25/2011  . Pregnancy, supervision of, high-risk 06/27/2011  . Lupus ([place]) 06/27/2011  . 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 Vaginal bleeding in pregnancy 06/27/2011  . Vaginal lesion 06/27/2011    Rosalyn Gess OTR/l,CLT 05/13/2018, 6:08 PM  [place] [facility] [address] [place], [address] Phone: [phone]   Fax:  [phone]  Name: Linda Vargas MRN: [id number] Date of Birth: 05-03-1976

## 2018-05-13 NOTE — Patient Instructions (Addendum)
Putty gripping only weekend  PROM or stretch for sup - but more at wrist , forearm ( not hand )  Supination wheel 20 reps  2 lbs weight 2 x 10 sup   Prayer stretch in shower  Wrist flexion stretch  RTB for scapula squeezes  Shoulder extention  Elbow extention 15 reps each   1 x day  Can increase in 3-4 days to 2 sets  And next week 3 sets

## 2018-05-18 ENCOUNTER — Ambulatory Visit: Payer: BC Managed Care – PPO | Admitting: Adult Health

## 2018-05-18 ENCOUNTER — Encounter: Payer: Self-pay | Admitting: Adult Health

## 2018-05-18 VITALS — BP 135/85 | HR 78 | Temp 99.0°F | Resp 16 | Ht 70.0 in | Wt 215.4 lb

## 2018-05-18 DIAGNOSIS — J019 Acute sinusitis, unspecified: Secondary | ICD-10-CM | POA: Diagnosis not present

## 2018-05-18 MED ORDER — AMOXICILLIN-POT CLAVULANATE 875-125 MG PO TABS: 1.0000 | ORAL_TABLET | Freq: Two times a day (BID) | ORAL | 0 refills | Status: DC

## 2018-05-18 NOTE — Patient Instructions (Signed)

## 2018-05-18 NOTE — Progress Notes (Signed)
[facility] [place], [place] [address]  Internal MEDICINE  Office Visit Note  Patient Name: Linda Vargas  [id number]  [id number]  Date of Service: 05/18/2018  Chief Complaint  Patient presents with  . Sinusitis  . Headache    yellow mucus going on for few days   . Cough  . Ear Pain  . Vomiting     HPI Pt is here for a sick visit.  She reports sinus pain and pressure x 3 days.  She reports fever an chills.  Bilateral ear pain/pressure.  She also reports pressure behind her eyes. She also complains of eye irritation.   She is a patient of [facility].  She saw them last month, and continues to use her Flonase and  Allegra.     Current Medication:  Outpatient Encounter Medications as of 05/18/2018  Medication Sig  . acetaminophen (TYLENOL) 500 MG tablet Take 1,000 mg by mouth every 8 (eight) hours as needed for headache.   . ALPRAZolam (XANAX) 0.25 MG tablet Take 1 tablet (0.25 mg total) by mouth 2 (two) times daily as needed for anxiety.  . Azelastine HCl 0.15 % SOLN INL 2 PFS IEN QD  . cetirizine (ZYRTEC) 10 MG tablet Take 10 mg by mouth daily.   Marland Kitchen EPIPEN 2-PAK 0.3 MG/0.3ML SOAJ injection INJECT INTRAMUSCULARLY AS DIRECTED FOR ANAPHYLATIC REACTIONS  . fexofenadine (ALLEGRA ALLERGY) 180 MG tablet Take 180 mg by mouth daily.  Marland Kitchen FLUoxetine (PROZAC) 10 MG tablet Take 1 tablet (10 mg total) by mouth daily.  . fluticasone (FLONASE) 50 MCG/ACT nasal spray Place 2 sprays into both nostrils daily as needed for allergies.  Marland Kitchen gabapentin (NEURONTIN) 100 MG capsule   . hydrochlorothiazide (HYDRODIURIL) 25 MG tablet Take 1 tablet (25 mg total) by mouth daily.  Marland Kitchen HYDROcodone-acetaminophen (NORCO/VICODIN) 5-325 MG tablet TK 1 T PO Q 6 H PRF PAIN  . hydroxychloroquine (PLAQUENIL) 200 MG tablet Take 200 mg by mouth 2 (two) times daily.    Marland Kitchen losartan (COZAAR) 50 MG tablet Take 1 tablet (50 mg total) by mouth daily.  . methylPREDNISolone (MEDROL DOSEPAK) 4 MG  TBPK tablet 6 day dose pack - take as directed  . mometasone (ELOCON) 0.1 % lotion APP TO ITCHY EARS BID FOR 12 DAYS. MAY REPEAT PRF ITCHY EARS.  Marland Kitchen ondansetron (ZOFRAN-ODT) 4 MG disintegrating tablet Take 1 tablet (4 mg total) by mouth every 8 (eight) hours as needed for nausea or vomiting.  . traMADol (ULTRAM) 50 MG tablet    No facility-administered encounter medications on file as of 05/18/2018.       Medical History: Past Medical History:  Diagnosis Date  . Anxiety   . Connective tissue disorder (HCC)    undifferentiated  . Fibromyalgia   . Hypertension   . Lupus ([place])   . Migraine   . Seasonal allergic rhinitis      Vital Signs: BP 135/85   Pulse 78   Temp 99 F (37.2 C)   Resp 16   Ht 5\' 10"  (1.778 m)   Wt 215 lb 6.4 oz (97.7 kg)   LMP 07/10/2017   SpO2 100%   BMI 30.91 kg/m    Review of Systems  Constitutional: Negative for chills, fatigue and unexpected weight change.  HENT: Positive for congestion, ear pain, facial swelling, postnasal drip, rhinorrhea, sinus pressure, sinus pain and sneezing. Negative for sore throat.   Eyes: Positive for redness and itching. Negative for photophobia and pain.  Respiratory: Negative  for cough, chest tightness and shortness of breath.   Cardiovascular: Negative for chest pain and palpitations.  Gastrointestinal: Negative for abdominal pain, constipation, diarrhea, nausea and vomiting.  Endocrine: Negative.   Genitourinary: Negative for dysuria and frequency.  Musculoskeletal: Negative for arthralgias, back pain, joint swelling and neck pain.  Skin: Negative for rash.  Allergic/Immunologic: Negative.   Neurological: Negative for tremors and numbness.  Hematological: Negative for adenopathy. Does not bruise/bleed easily.  Psychiatric/Behavioral: Negative for behavioral problems and sleep disturbance. The patient is not nervous/anxious.     Physical Exam  Constitutional: She is oriented to person, place, and time. She  appears well-developed and well-nourished. No distress.  HENT:  Head: Normocephalic and atraumatic.  Mouth/Throat: Oropharynx is clear and moist. No oropharyngeal exudate.  Eyes: Pupils are equal, round, and reactive to light. EOM are normal.  Neck: Normal range of motion. Neck supple. No JVD present. No tracheal deviation present. No thyromegaly present.  Cardiovascular: Normal rate, regular rhythm and normal heart sounds. Exam reveals no gallop and no friction rub.  No murmur heard. Pulmonary/Chest: Effort normal and breath sounds normal. No respiratory distress. She has no wheezes. She has no rales. She exhibits no tenderness.  Abdominal: Soft. There is no tenderness. There is no guarding.  Musculoskeletal: Normal range of motion.  Lymphadenopathy:    She has no cervical adenopathy.  Neurological: She is alert and oriented to person, place, and time. No cranial nerve deficit.  Skin: Skin is warm and dry. She is not diaphoretic.  Psychiatric: She has a normal mood and affect. Her behavior is normal. Judgment and thought content normal.  Nursing note and vitals reviewed.  Assessment/Plan: 1. Acute non-recurrent sinusitis, unspecified location Continue to use allegra, and Flonase.  Also consider mucinex to help with decongestant.   - amoxicillin-clavulanate (AUGMENTIN) 875-125 MG tablet; Take 1 tablet by mouth 2 (two) times daily.  Dispense: 20 tablet; Refill: 0  General Counseling: Linda Vargas verbalizes understanding of the findings of todays visit and agrees with plan of treatment. I have discussed any further diagnostic evaluation that may be needed or ordered today. We also reviewed her medications today. she has been encouraged to call the office with any questions or concerns that should arise related to todays visit.   No orders of the defined types were placed in this encounter.   No orders of the defined types were placed in this encounter.   Time spent: 25 Minutes  This  patient was seen by Orson Gear AGNP-C in Collaboration with Dr Lavera Guise as a part of collaborative care agreement

## 2018-05-20 ENCOUNTER — Encounter: Payer: Self-pay | Admitting: Adult Health

## 2018-05-24 ENCOUNTER — Ambulatory Visit: Payer: Self-pay | Admitting: Nurse Practitioner

## 2018-05-26 ENCOUNTER — Ambulatory Visit: Payer: BC Managed Care – PPO | Admitting: Podiatry

## 2018-05-26 ENCOUNTER — Encounter: Payer: Self-pay | Admitting: Nurse Practitioner

## 2018-05-26 ENCOUNTER — Ambulatory Visit: Payer: BC Managed Care – PPO | Admitting: Nurse Practitioner

## 2018-05-26 VITALS — BP 120/69 | HR 83 | Resp 16 | Ht 69.0 in | Wt 217.0 lb

## 2018-05-26 DIAGNOSIS — M329 Systemic lupus erythematosus, unspecified: Secondary | ICD-10-CM | POA: Diagnosis not present

## 2018-05-26 DIAGNOSIS — J019 Acute sinusitis, unspecified: Secondary | ICD-10-CM | POA: Diagnosis not present

## 2018-05-26 DIAGNOSIS — I10 Essential (primary) hypertension: Secondary | ICD-10-CM

## 2018-05-26 MED ORDER — AZITHROMYCIN 250 MG PO TABS: ORAL_TABLET | ORAL | 0 refills | Status: DC

## 2018-05-26 NOTE — Progress Notes (Signed)
[facility] [place], [place] [address]  Internal MEDICINE  Office Visit Note  Patient Name: Linda Vargas  [id number]  [id number]  Date of Service: 06/09/2018  Chief Complaint  Patient presents with  . Nausea  . Sinusitis    Sinusitis  This is a new problem. The current episode started 1 to 4 weeks ago. The problem is unchanged. There has been no fever. The fever has been present for less than 1 day. Her pain is at a severity of 3/10. The pain is mild. Associated symptoms include chills, congestion, coughing, ear pain, headaches, neck pain, sinus pressure, a sore throat and swollen glands. Pertinent negatives include no diaphoresis. Past treatments include oral decongestants and acetaminophen. The treatment provided no relief.       Current Medication: Outpatient Encounter Medications as of 05/26/2018  Medication Sig  . acetaminophen (TYLENOL) 500 MG tablet Take 1,000 mg by mouth every 8 (eight) hours as needed for headache.   . ALPRAZolam (XANAX) 0.25 MG tablet Take 1 tablet (0.25 mg total) by mouth 2 (two) times daily as needed for anxiety.  . Azelastine HCl 0.15 % SOLN INL 2 PFS IEN QD  . cetirizine (ZYRTEC) 10 MG tablet Take 10 mg by mouth daily.   Marland Kitchen EPIPEN 2-PAK 0.3 MG/0.3ML SOAJ injection INJECT INTRAMUSCULARLY AS DIRECTED FOR ANAPHYLATIC REACTIONS  . fexofenadine (ALLEGRA ALLERGY) 180 MG tablet Take 180 mg by mouth daily.  Marland Kitchen FLUoxetine (PROZAC) 10 MG tablet Take 1 tablet (10 mg total) by mouth daily.  . fluticasone (FLONASE) 50 MCG/ACT nasal spray Place 2 sprays into both nostrils daily as needed for allergies.  Marland Kitchen gabapentin (NEURONTIN) 100 MG capsule   . HYDROcodone-acetaminophen (NORCO/VICODIN) 5-325 MG tablet TK 1 T PO Q 6 H PRF PAIN  . hydroxychloroquine (PLAQUENIL) 200 MG tablet Take 200 mg by mouth 2 (two) times daily.    . methylPREDNISolone (MEDROL DOSEPAK) 4 MG TBPK tablet 6 day dose pack - take as directed  . mometasone (ELOCON) 0.1  % lotion APP TO ITCHY EARS BID FOR 12 DAYS. MAY REPEAT PRF ITCHY EARS.  Marland Kitchen ondansetron (ZOFRAN-ODT) 4 MG disintegrating tablet Take 1 tablet (4 mg total) by mouth every 8 (eight) hours as needed for nausea or vomiting.  . traMADol (ULTRAM) 50 MG tablet   . [DISCONTINUED] hydrochlorothiazide (HYDRODIURIL) 25 MG tablet Take 1 tablet (25 mg total) by mouth daily.  . [DISCONTINUED] losartan (COZAAR) 50 MG tablet Take 1 tablet (50 mg total) by mouth daily.  Marland Kitchen amoxicillin-clavulanate (AUGMENTIN) 875-125 MG tablet Take 1 tablet by mouth 2 (two) times daily. (Patient not taking: Reported on 05/26/2018)  . azithromycin (ZITHROMAX) 250 MG tablet z-pack - take as directed for 5 days   No facility-administered encounter medications on file as of 05/26/2018.     Surgical History: Past Surgical History:  Procedure Laterality Date  . CHOLECYSTECTOMY  2012  . DILATION AND CURETTAGE OF UTERUS  2011  . DILATION AND CURETTAGE OF UTERUS  01/17/2012   Procedure: DILATATION AND CURETTAGE;  Surgeon: Marylynn Pearson, MD;  Location: [facility];  Service: Gynecology;  Laterality: N/A;  Dilatation and curratage, insertion of Bakri Balloon  . FOOT SURGERY    . HARDWARE REMOVAL Right 02/18/2018   Procedure: HARDWARE REMOVAL RIGHT WRIST;  Surgeon: Hessie Knows, MD;  Location: [facility];  Service: Orthopedics;  Laterality: Right;  . OPEN REDUCTION INTERNAL FIXATION (ORIF) DISTAL RADIAL FRACTURE Right 11/26/2017   Procedure: OPEN REDUCTION INTERNAL FIXATION (ORIF) DISTAL RADIAL FRACTURE;  Surgeon: Hessie Knows, MD;  Location: [facility];  Service: Orthopedics;  Laterality: Right;  . TONSILLECTOMY    . WRIST SURGERY Right 02/18/2018    Medical History: Past Medical History:  Diagnosis Date  . Anxiety   . Connective tissue disorder (HCC)    undifferentiated  . Fibromyalgia   . Hypertension   . Lupus ([place])   . Migraine   . Seasonal allergic rhinitis     Family History: Family History  Problem Relation Age of Onset  .  Hypertension Mother   . Hypertension Father   . Diabetes Father   . Heart disease Maternal Grandmother   . Heart disease Paternal Grandmother   . Anesthesia problems Neg Hx     Social History   Socioeconomic History  . Marital status: Single    Spouse name: Not on file  . Number of children: Not on file  . Years of education: Not on file  . Highest education level: Not on file  Occupational History  . Not on file  Social Needs  . Financial resource strain: Not on file  . Food insecurity:    Worry: Not on file    Inability: Not on file  . Transportation needs:    Medical: Not on file    Non-medical: Not on file  Tobacco Use  . Smoking status: Never Smoker  . Smokeless tobacco: Never Used  Substance and Sexual Activity  . Alcohol use: No  . Drug use: No  . Sexual activity: Not on file  Lifestyle  . Physical activity:    Days per week: Not on file    Minutes per session: Not on file  . Stress: Not on file  Relationships  . Social connections:    Talks on phone: Not on file    Gets together: Not on file    Attends religious service: Not on file    Active member of club or organization: Not on file    Attends meetings of clubs or organizations: Not on file    Relationship status: Not on file  . Intimate partner violence:    Fear of current or ex partner: Not on file    Emotionally abused: Not on file    Physically abused: Not on file    Forced sexual activity: Not on file  Other Topics Concern  . Not on file  Social History Narrative   Lives at home.      Review of Systems  Constitutional: Positive for chills and fatigue. Negative for appetite change, diaphoresis and fever.  HENT: Positive for congestion, ear pain, postnasal drip, rhinorrhea, sinus pressure, sinus pain, sore throat and voice change.   Eyes: Negative.   Respiratory: Positive for cough and wheezing.   Cardiovascular: Negative for chest pain and palpitations.  Gastrointestinal: Positive for  nausea. Negative for vomiting.  Endocrine: Negative for cold intolerance, heat intolerance, polydipsia, polyphagia and polyuria.  Genitourinary: Negative.   Musculoskeletal: Positive for back pain and neck pain.  Skin: Negative.   Allergic/Immunologic: Positive for environmental allergies.  Neurological: Positive for headaches.  Psychiatric/Behavioral: Negative for agitation and dysphoric mood. The patient is not nervous/anxious.     Today's Vitals   05/26/18 1630  BP: 120/69  Pulse: 83  Resp: 16  SpO2: 98%  Weight: 217 lb (98.4 kg)  Height: 5\' 9"  (1.753 m)    Physical Exam  Constitutional: She is oriented to person, place, and time. She appears well-developed and well-nourished. No distress.  HENT:  Head: Normocephalic  and atraumatic.  Right Ear: Tympanic membrane is bulging.  Left Ear: Tympanic membrane is bulging.  Nose: Rhinorrhea present. Right sinus exhibits maxillary sinus tenderness and frontal sinus tenderness. Left sinus exhibits maxillary sinus tenderness and frontal sinus tenderness.  Mouth/Throat: Posterior oropharyngeal erythema present. No oropharyngeal exudate.  Eyes: Pupils are equal, round, and reactive to light. Conjunctivae and EOM are normal.  Neck: Normal range of motion. Neck supple. No JVD present. No tracheal deviation present. No thyromegaly present.  Cardiovascular: Normal rate, regular rhythm and normal heart sounds. Exam reveals no gallop and no friction rub.  No murmur heard. Pulmonary/Chest: Effort normal and breath sounds normal. No respiratory distress. She has no wheezes. She has no rales. She exhibits no tenderness.  Abdominal: Soft. Bowel sounds are normal. There is no tenderness. There is no guarding.  Musculoskeletal: Normal range of motion.  Lymphadenopathy:    She has cervical adenopathy.  Neurological: She is alert and oriented to person, place, and time. No cranial nerve deficit.  Skin: Skin is warm and dry. She is not diaphoretic.   Psychiatric: She has a normal mood and affect. Her behavior is normal. Judgment and thought content normal.  Nursing note and vitals reviewed.   Assessment/Plan:  1. Acute non-recurrent sinusitis, unspecified location Start z-pack. Take as directed for 5 days. Rest and increase fluids. OTC medication to relieve symptoms.  - azithromycin (ZITHROMAX) 250 MG tablet; z-pack - take as directed for 5 days  Dispense: 6 tablet; Refill: 0  2. Essential hypertension Continue bp medication as prescribed   3. Lupus ([place]) Regular visits with rheumatology as scheduled.    General Counseling: Brentney verbalizes understanding of the findings of todays visit and agrees with plan of treatment. I have discussed any further diagnostic evaluation that may be needed or ordered today. We also reviewed her medications today. she has been encouraged to call the office with any questions or concerns that should arise related to todays visit.  Rest and increase fluids. Continue using OTC medication to control symptoms.   This patient was seen by Blaine with Dr Lavera Guise as a part of collaborative care agreement    Meds ordered this encounter  Medications  . azithromycin (ZITHROMAX) 250 MG tablet    Sig: z-pack - take as directed for 5 days    Dispense:  6 tablet    Refill:  0    Order Specific Question:   Supervising Provider    Answer:   Lavera Guise [[id number]]    Time spent: 24 Minutes      Dr Lavera Guise Internal medicine

## 2018-06-01 ENCOUNTER — Other Ambulatory Visit: Payer: Self-pay | Admitting: Internal Medicine

## 2018-06-01 DIAGNOSIS — I10 Essential (primary) hypertension: Secondary | ICD-10-CM

## 2018-06-03 ENCOUNTER — Ambulatory Visit: Payer: BC Managed Care – PPO | Admitting: Occupational Therapy

## 2018-06-09 DIAGNOSIS — J0141 Acute recurrent pansinusitis: Secondary | ICD-10-CM | POA: Insufficient documentation

## 2018-06-09 DIAGNOSIS — J0140 Acute pansinusitis, unspecified: Secondary | ICD-10-CM | POA: Insufficient documentation

## 2018-06-09 DIAGNOSIS — J019 Acute sinusitis, unspecified: Secondary | ICD-10-CM | POA: Insufficient documentation

## 2018-06-17 ENCOUNTER — Ambulatory Visit: Payer: BC Managed Care – PPO | Attending: Orthopedic Surgery | Admitting: Occupational Therapy

## 2018-06-17 DIAGNOSIS — M25641 Stiffness of right hand, not elsewhere classified: Secondary | ICD-10-CM | POA: Diagnosis present

## 2018-06-17 DIAGNOSIS — M79641 Pain in right hand: Secondary | ICD-10-CM | POA: Diagnosis present

## 2018-06-17 DIAGNOSIS — M25531 Pain in right wrist: Secondary | ICD-10-CM | POA: Diagnosis present

## 2018-06-17 DIAGNOSIS — M6281 Muscle weakness (generalized): Secondary | ICD-10-CM

## 2018-06-17 DIAGNOSIS — M25631 Stiffness of right wrist, not elsewhere classified: Secondary | ICD-10-CM | POA: Diagnosis present

## 2018-06-17 NOTE — Therapy (Signed)
[facility] [address] [place], [address] Phone: [phone]   Fax:  [phone]  Occupational Therapy Treatment  Patient Details  Name: Linda Vargas MRN: [id number] Date of Birth: Jul 10, 1976 Referring Provider: Arvella Nigh    Encounter Date: 06/17/2018  OT End of Session - 06/17/18 1620    Visit Number  6    Number of Visits  28    Date for OT Re-Evaluation  07/08/18    OT Start Time  1536    OT Stop Time  1606    OT Time Calculation (min)  30 min    Activity Tolerance  Patient tolerated treatment well    Behavior During Therapy  [facility] for tasks assessed/performed       Past Medical History:  Diagnosis Date  . Anxiety   . Connective tissue disorder (HCC)    undifferentiated  . Fibromyalgia   . Hypertension   . Lupus ([place])   . Migraine   . Seasonal allergic rhinitis     Past Surgical History:  Procedure Laterality Date  . CHOLECYSTECTOMY  2012  . DILATION AND CURETTAGE OF UTERUS  2011  . DILATION AND CURETTAGE OF UTERUS  01/17/2012   Procedure: DILATATION AND CURETTAGE;  Surgeon: Marylynn Pearson, MD;  Location: [facility];  Service: Gynecology;  Laterality: N/A;  Dilatation and curratage, insertion of Bakri Balloon  . FOOT SURGERY    . HARDWARE REMOVAL Right 02/18/2018   Procedure: HARDWARE REMOVAL RIGHT WRIST;  Surgeon: Hessie Knows, MD;  Location: [facility];  Service: Orthopedics;  Laterality: Right;  . OPEN REDUCTION INTERNAL FIXATION (ORIF) DISTAL RADIAL FRACTURE Right 11/26/2017   Procedure: OPEN REDUCTION INTERNAL FIXATION (ORIF) DISTAL RADIAL FRACTURE;  Surgeon: Hessie Knows, MD;  Location: [facility];  Service: Orthopedics;  Laterality: Right;  . TONSILLECTOMY    . WRIST SURGERY Right 02/18/2018    There were no vitals filed for this visit.  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  Yes    Pain Score  6     Pain Location  Wrist    Pain Orientation  Right    Pain Descriptors / Indicators  Aching;Pins and needles;Numbness    Pain Type  Surgical pain;Acute pain    Pain Onset  More than a month ago    Pain Frequency  Constant    Aggravating Factors   end of day and night time          [facility] OT Assessment - 06/17/18 0001      AROM   Right Forearm Supination  80 Degrees    Right Wrist Extension  60 Degrees    Right Wrist Flexion  60 Degrees    Right Wrist Radial Deviation  25 Degrees    Right Wrist Ulnar Deviation  25 Degrees      Strength   Right Hand Grip (lbs)  38    Right Hand  Lateral Pinch  16 lbs    Right Hand 3 Point Pinch  8 lbs    Left Hand Lateral Pinch  14 lbs       Assess AROM for wrist in all planes - great progress   and grip and prehension strength assess- also great progress   pt been back teaching for 7 wks - but show increase numbness and pain in hand and wrist Per pt constant and at night time more- remind pt to wear wrist splint at night time until nerve conduction and 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   Pt to cont some PROM for end range for supination  And 2 lbs weight if no pain                OT Education - 06/17/18 1619    Education provided  Yes    Education Details  splint wearing for night time - CTS     Person(s) Educated  Patient    Methods  Explanation;Demonstration    Comprehension  Verbalized understanding;Returned demonstration       OT Short Term Goals - 06/17/18 1626      OT SHORT TERM GOAL #1   Title  Pt R hand digits able to make full fist and touch palm , and extend digits to reach with hand in  pocket     Status  Achieved      OT SHORT TERM GOAL #2   Title   R wrist AROM improve for pt to maintain midline to use hand in ADL's with more ease     Status  Achieved      OT SHORT TERM GOAL #3   Title  Pain on PRWHE improve in R hand and wrist by more than 15 points     Baseline  at eval pain on PRHWE is 34/50 - pain did improve but  increase again the last few wks - CTS-  since getting ready for school and teaching now  - computer and writing  now 36/50    Time  4    Period  Weeks    Status  On-going    Target Date  07/08/18        OT Long Term Goals - 06/17/18 1624      OT LONG TERM GOAL #1   Title  R wrist AROM improve with more than 10-30 degrees in all planes to use R hand in more than 50% of ADL's     Status  Achieved      OT LONG TERM GOAL #2   Title  R grip strength increase to more than 50% compare to L hand to carry and hold objects at home and school     Status  Achieved      OT LONG TERM GOAL #3   Title  Function score on PRWHE imprrove with more than 25 points     Baseline  Function score at eval 47/50  - improved  to 19/50    Time  6    Period  Weeks    Status  On-going    Target Date  06/24/18      OT LONG TERM GOAL #4   Title  Pt. will increase RUE strength by 2 muscle grades for ADL /IADL tasks.    Baseline  still decrease in sup /pro    Time  4    Period  Weeks    Status  On-going    Target Date  06/24/18  OT LONG TERM GOAL #5   Title  Pt. will increase the awareness of her RUE and hand by using compensatory strategies 100% of the time     Status  Achieved            Plan - 06/17/18 1621    Clinical Impression Statement  Pt showed great progress since month ago in wrist extention , supination -and grip  and lateral grip - pt teaching now for about 7 wks - she do show still increase pain and numbness over medial N - CTS - she is scheduled for nerve conduction on 23th Sept  and will be out of school for 2 wks- hoping to have surgery then  for CTS - pt to contact me after appt with Dr Rudene Christians     Occupational performance deficits (Please refer to evaluation for details):  ADL's;IADL's;Work;Play;Leisure;Social Participation    Rehab Potential  Good    Current Impairments/barriers affecting progress:  Positive indicators: family support, motivation, age Negative indicators: multiple comorbidities- hardware removal - 3 months s/p ORIF     OT Frequency  Monthly    OT Duration  4 weeks    OT Treatment/Interventions  Self-care/ADL training;Therapeutic exercise;Moist Heat;Splinting;Patient/family education;Scar mobilization;Fluidtherapy;Cryotherapy;Contrast Bath;Manual Therapy;Passive range of motion    Plan  contact me after Dr Rudene Christians seen after nerve conduction    Clinical Decision Making  Several treatment options, min-mod task modification necessary    OT Home Exercise Plan  see pt instruction     Consulted and Agree with Plan of Care  Patient       Patient will benefit from skilled therapeutic intervention in order to improve the following deficits and impairments:  Pain, Impaired flexibility, Increased edema, Decreased coordination, Impaired sensation, Decreased scar mobility, Decreased strength, Decreased range of motion, Impaired UE functional use, Decreased knowledge of precautions  Visit Diagnosis: Pain in right hand  Pain in right wrist  Stiffness of right wrist, not elsewhere classified  Stiffness of right hand, not elsewhere classified  Muscle weakness (generalized)    Problem List Patient Active Problem List   Diagnosis Date Noted  . Acute non-recurrent sinusitis 06/09/2018  . Essential hypertension 04/06/2018  . Generalized anxiety disorder 04/06/2018  . Depression, major, single episode, moderate ([place]) 04/06/2018  . Depression 01/21/2017  . Contusion of right foot 01/19/2017  . Intractable migraine with aura without status migrainosus 12/12/2016  . TIA (transient ischemic attack) 10/12/2015  . Long-term use  of Plaquenil 07/21/2014  . Fibromyalgia 11/12/2012  . Undifferentiated connective tissue disease ([place]) 09/25/2011  . Pregnancy, supervision of, high-risk 06/27/2011  . Lupus ([place]) 06/27/2011  . 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 Vaginal bleeding in pregnancy 06/27/2011  . Vaginal lesion 06/27/2011    Rosalyn Gess OTR/L,CLT 06/17/2018, 4:27 PM  [facility] [address] [place], [address] Phone: [phone]   Fax:  [phone]  Name: SARIT SPARANO MRN: [id number] Date of Birth: 01-29-1976

## 2018-06-17 NOTE — Patient Instructions (Signed)
Focus on sup stretch  And 2 lbs weight for strengthening   Wear wrist splint at night time too for numbness

## 2018-06-23 ENCOUNTER — Ambulatory Visit: Payer: BC Managed Care – PPO | Admitting: Adult Health

## 2018-06-23 ENCOUNTER — Encounter: Payer: Self-pay | Admitting: Adult Health

## 2018-06-23 VITALS — BP 136/78 | HR 80 | Temp 98.9°F | Resp 16 | Ht 70.0 in | Wt 216.2 lb

## 2018-06-23 DIAGNOSIS — R059 Cough, unspecified: Secondary | ICD-10-CM

## 2018-06-23 DIAGNOSIS — R05 Cough: Secondary | ICD-10-CM | POA: Diagnosis not present

## 2018-06-23 DIAGNOSIS — J069 Acute upper respiratory infection, unspecified: Secondary | ICD-10-CM

## 2018-06-23 MED ORDER — AZITHROMYCIN 250 MG PO TABS: ORAL_TABLET | ORAL | 0 refills | Status: DC

## 2018-06-23 MED ORDER — PREDNISONE 10 MG PO TABS: ORAL_TABLET | ORAL | 0 refills | Status: DC

## 2018-06-23 MED ORDER — GUAIFENESIN-CODEINE 100-10 MG/5ML PO SYRP: 5.0000 mL | ORAL_SOLUTION | Freq: Every evening | ORAL | 0 refills | Status: DC | PRN

## 2018-06-23 NOTE — Progress Notes (Signed)
[facility] [place], [place] [address]  Internal MEDICINE  Office Visit Note  Patient Name: Linda Vargas  [id number]  [id number]  Date of Service: 07/02/2018  Chief Complaint  Patient presents with  . Cough    bloody phlegm   . Sinusitis  . Sore Throat  . Headache  . Fever    chills      HPI Pt is here for a sick visit.  She reports clear running nose, headache, post nasal drip, sore throat and painful cough.  She reports being able to cough up some yellow Phlem.  She reports feeling chills yesterday also. She denies feeling short of breath, and states that she feels weak and tired.  She was seen on 05/18/18 for sinusitis.  She was given Augmentin and reports she started to feel better.  However, she feels like now the sickness is settling in her chest.         Current Medication:  Outpatient Encounter Medications as of 06/23/2018  Medication Sig  . acetaminophen (TYLENOL) 500 MG tablet Take 1,000 mg by mouth every 8 (eight) hours as needed for headache.   . ALPRAZolam (XANAX) 0.25 MG tablet Take 1 tablet (0.25 mg total) by mouth 2 (two) times daily as needed for anxiety.  . Azelastine HCl 0.15 % SOLN INL 2 PFS IEN QD  . cetirizine (ZYRTEC) 10 MG tablet Take 10 mg by mouth daily.   Marland Kitchen EPIPEN 2-PAK 0.3 MG/0.3ML SOAJ injection INJECT INTRAMUSCULARLY AS DIRECTED FOR ANAPHYLATIC REACTIONS  . fexofenadine (ALLEGRA ALLERGY) 180 MG tablet Take 180 mg by mouth daily.  Marland Kitchen FLUoxetine (PROZAC) 10 MG tablet Take 1 tablet (10 mg total) by mouth daily.  . hydrochlorothiazide (HYDRODIURIL) 25 MG tablet TAKE 1 TABLET BY MOUTH DAILY  . hydroxychloroquine (PLAQUENIL) 200 MG tablet Take 200 mg by mouth 2 (two) times daily.    . traMADol (ULTRAM) 50 MG tablet   . [DISCONTINUED] losartan (COZAAR) 50 MG tablet TAKE 1 TABLET BY MOUTH EVERY MORNING. DISCONTINUE LISINOPRIL/ HCTZ  . azithromycin (ZITHROMAX) 250 MG tablet Take 2 tabs PO on Day 1.  Take 1 Tab PO on days 2-5   . guaiFENesin-codeine (ROBITUSSIN AC) 100-10 MG/5ML syrup Take 5 mLs by mouth at bedtime as needed for cough.  . mometasone (ELOCON) 0.1 % lotion APP TO ITCHY EARS BID FOR 12 DAYS. MAY REPEAT PRF ITCHY EARS.  Marland Kitchen predniSONE (DELTASONE) 10 MG tablet Use per dose pack  . [DISCONTINUED] amoxicillin-clavulanate (AUGMENTIN) 875-125 MG tablet Take 1 tablet by mouth 2 (two) times daily. (Patient not taking: Reported on 05/26/2018)  . [DISCONTINUED] azithromycin (ZITHROMAX) 250 MG tablet z-pack - take as directed for 5 days  . [DISCONTINUED] azithromycin (ZITHROMAX) 250 MG tablet Take 2 tabs by mouth on day 1 Then take 1 tab by mouth on days 2-5.  . [DISCONTINUED] fluticasone (FLONASE) 50 MCG/ACT nasal spray Place 2 sprays into both nostrils daily as needed for allergies.  . [DISCONTINUED] gabapentin (NEURONTIN) 100 MG capsule   . [DISCONTINUED] HYDROcodone-acetaminophen (NORCO/VICODIN) 5-325 MG tablet TK 1 T PO Q 6 H PRF PAIN  . [DISCONTINUED] methylPREDNISolone (MEDROL DOSEPAK) 4 MG TBPK tablet 6 day dose pack - take as directed (Patient not taking: Reported on 06/23/2018)  . [DISCONTINUED] ondansetron (ZOFRAN-ODT) 4 MG disintegrating tablet Take 1 tablet (4 mg total) by mouth every 8 (eight) hours as needed for nausea or vomiting. (Patient not taking: Reported on 06/23/2018)  . [DISCONTINUED] predniSONE (DELTASONE) 10 MG tablet Use per  dose pack   No facility-administered encounter medications on file as of 06/23/2018.       Medical History: Past Medical History:  Diagnosis Date  . Anxiety   . Connective tissue disorder (HCC)    undifferentiated  . Fibromyalgia   . Hypertension   . Lupus ([place])   . Migraine   . Seasonal allergic rhinitis      Vital Signs: BP 136/78   Pulse 80   Temp 98.9 F (37.2 C)   Resp 16   Ht 5\' 10"  (1.778 m)   Wt 216 lb 3.2 oz (98.1 kg)   LMP 07/10/2017   SpO2 99%   BMI 31.02 kg/m    Review of Systems  Constitutional: Positive for chills and fatigue.  Negative for unexpected weight change.  HENT: Positive for postnasal drip, rhinorrhea, sore throat and voice change. Negative for congestion and sneezing.   Eyes: Negative for photophobia, pain and redness.  Respiratory: Positive for cough, chest tightness and shortness of breath.   Cardiovascular: Negative for chest pain and palpitations.  Gastrointestinal: Negative for abdominal pain, constipation, diarrhea, nausea and vomiting.  Endocrine: Negative.   Genitourinary: Negative for dysuria and frequency.  Musculoskeletal: Negative for arthralgias, back pain, joint swelling and neck pain.  Skin: Negative for rash.  Allergic/Immunologic: Negative.   Neurological: Negative for tremors and numbness.  Hematological: Negative for adenopathy. Does not bruise/bleed easily.  Psychiatric/Behavioral: Negative for behavioral problems and sleep disturbance. The patient is not nervous/anxious.     Physical Exam  Constitutional: She is oriented to person, place, and time. She appears well-developed and well-nourished. No distress.  HENT:  Head: Normocephalic and atraumatic.  Mouth/Throat: Oropharynx is clear and moist. No oropharyngeal exudate.  Eyes: Pupils are equal, round, and reactive to light. EOM are normal.  Neck: Normal range of motion. Neck supple. No JVD present. No tracheal deviation present. No thyromegaly present.  Cardiovascular: Normal rate, regular rhythm and normal heart sounds. Exam reveals no gallop and no friction rub.  No murmur heard. Pulmonary/Chest: Effort normal and breath sounds normal. No respiratory distress. She has no wheezes. She has no rales. She exhibits no tenderness.  Abdominal: Soft. There is no tenderness. There is no guarding.  Musculoskeletal: Normal range of motion.  Lymphadenopathy:    She has no cervical adenopathy.  Neurological: She is alert and oriented to person, place, and time. No cranial nerve deficit.  Skin: Skin is warm and dry. She is not  diaphoretic.  Psychiatric: She has a normal mood and affect. Her behavior is normal. Judgment and thought content normal.  Nursing note and vitals reviewed.  Assessment/Plan: 1. Upper respiratory tract infection, unspecified type - predniSONE (DELTASONE) 10 MG tablet; Use per dose pack  Dispense: 21 tablet; Refill: 0 - azithromycin (ZITHROMAX) 250 MG tablet; Take 2 tabs by mouth on day 1 Then take 1 tab by mouth on days 2-5.  Dispense: 6 tablet; Refill: 0  2. Cough in adult Pt instructed to return to clinic or call for Chest x-ray if new symptoms develop or if she is not better after antibiotics.  - guaiFENesin-codeine (ROBITUSSIN AC) 100-10 MG/5ML syrup; Take 5 mLs by mouth at bedtime as needed for cough.  Dispense: 118 mL; Refill: 0  General Counseling: Teela verbalizes understanding of the findings of todays visit and agrees with plan of treatment. I have discussed any further diagnostic evaluation that may be needed or ordered today. We also reviewed her medications today. she has been encouraged to call the  office with any questions or concerns that should arise related to todays visit.   Meds ordered this encounter  Medications  . DISCONTD: predniSONE (DELTASONE) 10 MG tablet    Sig: Use per dose pack    Dispense:  21 tablet    Refill:  0  . DISCONTD: azithromycin (ZITHROMAX) 250 MG tablet    Sig: Take 2 tabs by mouth on day 1 Then take 1 tab by mouth on days 2-5.    Dispense:  6 tablet    Refill:  0  . azithromycin (ZITHROMAX) 250 MG tablet    Sig: Take 2 tabs PO on Day 1.  Take 1 Tab PO on days 2-5    Dispense:  6 tablet    Refill:  0  . predniSONE (DELTASONE) 10 MG tablet    Sig: Use per dose pack    Dispense:  21 tablet    Refill:  0  . guaiFENesin-codeine (ROBITUSSIN AC) 100-10 MG/5ML syrup    Sig: Take 5 mLs by mouth at bedtime as needed for cough.    Dispense:  118 mL    Refill:  0    Time spent: 25 Minutes  This patient was seen by Orson Gear AGNP-C in  Collaboration with Dr Lavera Guise as a part of collaborative care agreement

## 2018-06-23 NOTE — Patient Instructions (Signed)
Upper Respiratory Infection, Adult Most upper respiratory infections (URIs) are caused by a virus. A URI affects the nose, throat, and upper air passages. The most common type of URI is often called "the common cold." Follow these instructions at home:  Take medicines only as told by your doctor.  Gargle warm saltwater or take cough drops to comfort your throat as told by your doctor.  Use a warm mist humidifier or inhale steam from a shower to increase air moisture. This may make it easier to breathe.  Drink enough fluid to keep your pee (urine) clear or pale yellow.  Eat soups and other clear broths.  Have a healthy diet.  Rest as needed.  Go back to work when your fever is gone or your doctor says it is okay. ? You may need to stay home longer to avoid giving your URI to others. ? You can also wear a face mask and wash your hands often to prevent spread of the virus.  Use your inhaler more if you have asthma.  Do not use any tobacco products, including cigarettes, chewing tobacco, or electronic cigarettes. If you need help quitting, ask your doctor. Contact a doctor if:  You are getting worse, not better.  Your symptoms are not helped by medicine.  You have chills.  You are getting more short of breath.  You have brown or red mucus.  You have yellow or brown discharge from your nose.  You have pain in your face, especially when you bend forward.  You have a fever.  You have puffy (swollen) neck glands.  You have pain while swallowing.  You have white areas in the back of your throat. Get help right away if:  You have very bad or constant: ? Headache. ? Ear pain. ? Pain in your forehead, behind your eyes, and over your cheekbones (sinus pain). ? Chest pain.  You have long-lasting (chronic) lung disease and any of the following: ? Wheezing. ? Long-lasting cough. ? Coughing up blood. ? A change in your usual mucus.  You have a stiff neck.  You have  changes in your: ? Vision. ? Hearing. ? Thinking. ? Mood. This information is not intended to replace advice given to you by your health care provider. Make sure you discuss any questions you have with your health care provider. Document Released: 03/17/2008 Document Revised: 06/01/2016 Document Reviewed: 01/04/2014 Elsevier Interactive Patient Education  2018 [employer]vier Inc.  

## 2018-06-25 ENCOUNTER — Other Ambulatory Visit: Payer: Self-pay

## 2018-07-05 ENCOUNTER — Other Ambulatory Visit: Payer: Self-pay

## 2018-07-05 DIAGNOSIS — I10 Essential (primary) hypertension: Secondary | ICD-10-CM

## 2018-07-05 DIAGNOSIS — F321 Major depressive disorder, single episode, moderate: Secondary | ICD-10-CM

## 2018-07-05 MED ORDER — HYDROCHLOROTHIAZIDE 25 MG PO TABS: 25.0000 mg | ORAL_TABLET | Freq: Every day | ORAL | 6 refills | Status: DC

## 2018-07-05 MED ORDER — FLUOXETINE HCL 10 MG PO TABS: 10.0000 mg | ORAL_TABLET | Freq: Every day | ORAL | 3 refills | Status: DC

## 2018-07-05 MED ORDER — LOSARTAN POTASSIUM 50 MG PO TABS: 50.0000 mg | ORAL_TABLET | Freq: Every day | ORAL | 5 refills | Status: DC

## 2018-07-07 ENCOUNTER — Other Ambulatory Visit: Payer: Self-pay

## 2018-07-07 DIAGNOSIS — F321 Major depressive disorder, single episode, moderate: Secondary | ICD-10-CM

## 2018-07-07 MED ORDER — FLUOXETINE HCL 10 MG PO TABS: 10.0000 mg | ORAL_TABLET | Freq: Every day | ORAL | 3 refills | Status: DC

## 2018-08-26 ENCOUNTER — Ambulatory Visit: Payer: Self-pay | Admitting: Nurse Practitioner

## 2018-10-19 ENCOUNTER — Ambulatory Visit: Payer: BC Managed Care – PPO | Admitting: Nurse Practitioner

## 2018-10-19 ENCOUNTER — Encounter: Payer: Self-pay | Admitting: Nurse Practitioner

## 2018-10-19 VITALS — BP 117/68 | HR 86 | Resp 16 | Ht 70.0 in | Wt 215.8 lb

## 2018-10-19 DIAGNOSIS — R509 Fever, unspecified: Secondary | ICD-10-CM | POA: Diagnosis not present

## 2018-10-19 DIAGNOSIS — J069 Acute upper respiratory infection, unspecified: Secondary | ICD-10-CM

## 2018-10-19 DIAGNOSIS — R5383 Other fatigue: Secondary | ICD-10-CM | POA: Diagnosis not present

## 2018-10-19 LAB — POC INFLUENZA TEST
Negative: NEGATIVE
Positive: NEGATIVE

## 2018-10-19 MED ORDER — CEFUROXIME AXETIL 500 MG PO TABS: 500.0000 mg | ORAL_TABLET | Freq: Two times a day (BID) | ORAL | 0 refills | Status: DC

## 2018-10-19 MED ORDER — PREDNISONE 10 MG (21) PO TBPK: ORAL_TABLET | ORAL | 0 refills | Status: DC

## 2018-10-19 NOTE — Progress Notes (Signed)
[facility] [place], [place] [address]  Internal MEDICINE  Office Visit Note  Patient Name: Linda Vargas  [id number]  [id number]  Date of Service: 10/20/2018  Chief Complaint  Patient presents with  . Sinusitis    may have had a fever, no chills, sweats  . Cough    saturday pt was at ER with grandmother who unexoectedly   . Nasal Congestion  . Headache    passed away, havent had sleep or eating anything,    . Fatigue     The patient is here for sick visit. She has been having headache, congestions, sore throat, and cough. Started on Sunday and has gradually become worse. Started right afte her grandmother passed away. Initially though symptoms wre stress induced, but are persistent. Denies fever. She denies nausea or vomiting. The funeral services for her grandmother will be Saturday and she would like to feel better for this.   Pt is here for a sick visit.     Current Medication:  Outpatient Encounter Medications as of 10/19/2018  Medication Sig  . acetaminophen (TYLENOL) 500 MG tablet Take 1,000 mg by mouth every 8 (eight) hours as needed for headache.   . ALPRAZolam (XANAX) 0.25 MG tablet Take 1 tablet (0.25 mg total) by mouth 2 (two) times daily as needed for anxiety.  . Azelastine HCl 0.15 % SOLN INL 2 PFS IEN QD  . cetirizine (ZYRTEC) 10 MG tablet Take 10 mg by mouth daily.   Marland Kitchen EPIPEN 2-PAK 0.3 MG/0.3ML SOAJ injection INJECT INTRAMUSCULARLY AS DIRECTED FOR ANAPHYLATIC REACTIONS  . fexofenadine (ALLEGRA ALLERGY) 180 MG tablet Take 180 mg by mouth daily.  Marland Kitchen FLUoxetine (PROZAC) 10 MG tablet Take 1 tablet (10 mg total) by mouth daily.  Marland Kitchen guaiFENesin-codeine (ROBITUSSIN AC) 100-10 MG/5ML syrup Take 5 mLs by mouth at bedtime as needed for cough.  . hydrochlorothiazide (HYDRODIURIL) 25 MG tablet Take 1 tablet (25 mg total) by mouth daily.  . hydroxychloroquine (PLAQUENIL) 200 MG tablet Take 200 mg by mouth 2 (two) times daily.    Marland Kitchen losartan  (COZAAR) 50 MG tablet Take 1 tablet (50 mg total) by mouth daily.  . mometasone (ELOCON) 0.1 % lotion APP TO ITCHY EARS BID FOR 12 DAYS. MAY REPEAT PRF ITCHY EARS.  Marland Kitchen traMADol (ULTRAM) 50 MG tablet   . [DISCONTINUED] predniSONE (DELTASONE) 10 MG tablet Use per dose pack  . azithromycin (ZITHROMAX) 250 MG tablet Take 2 tabs PO on Day 1.  Take 1 Tab PO on days 2-5 (Patient not taking: Reported on 10/19/2018)  . cefUROXime (CEFTIN) 500 MG tablet Take 1 tablet (500 mg total) by mouth 2 (two) times daily with a meal.  . predniSONE (STERAPRED UNI-PAK 21 TAB) 10 MG (21) TBPK tablet 6 day taper - take by mouth as directed for 6 days   No facility-administered encounter medications on file as of 10/19/2018.       Medical History: Past Medical History:  Diagnosis Date  . Anxiety   . Connective tissue disorder (HCC)    undifferentiated  . Fibromyalgia   . Hypertension   . Lupus ([place])   . Migraine   . Seasonal allergic rhinitis      Today's Vitals   10/19/18 1547  BP: 117/68  Pulse: 86  Resp: 16  SpO2: 98%  Weight: 215 lb 12.8 oz (97.9 kg)  Height: 5\' 10"  (1.778 m)    Review of Systems  Constitutional: Positive for chills and fatigue. Negative for appetite  change, diaphoresis and fever.  HENT: Positive for congestion, ear pain, postnasal drip, rhinorrhea, sinus pressure, sinus pain, sore throat and voice change.   Eyes: Negative.   Respiratory: Positive for cough and wheezing.   Cardiovascular: Negative for chest pain and palpitations.  Gastrointestinal: Positive for nausea. Negative for vomiting.  Genitourinary: Negative.   Musculoskeletal: Negative for back pain and neck pain.  Skin: Negative.   Allergic/Immunologic: Positive for environmental allergies.  Neurological: Positive for headaches.  Hematological: Negative for adenopathy.  Psychiatric/Behavioral: Negative for agitation and dysphoric mood. The patient is not nervous/anxious.     Physical Exam Vitals signs and nursing  note reviewed.  Constitutional:      General: She is not in acute distress.    Appearance: She is well-developed. She is ill-appearing. She is not diaphoretic.  HENT:     Head: Normocephalic and atraumatic.     Right Ear: Tympanic membrane is bulging.     Left Ear: Tympanic membrane is bulging.     Nose: Congestion and rhinorrhea present.     Right Sinus: Maxillary sinus tenderness and frontal sinus tenderness present.     Left Sinus: Maxillary sinus tenderness and frontal sinus tenderness present.     Mouth/Throat:     Pharynx: Posterior oropharyngeal erythema present. No oropharyngeal exudate.  Eyes:     Conjunctiva/sclera: Conjunctivae normal.     Pupils: Pupils are equal, round, and reactive to light.  Neck:     Musculoskeletal: Normal range of motion and neck supple.     Thyroid: No thyromegaly.     Vascular: No JVD.     Trachea: No tracheal deviation.  Cardiovascular:     Rate and Rhythm: Normal rate and regular rhythm.     Heart sounds: Normal heart sounds. No murmur. No friction rub. No gallop.   Pulmonary:     Effort: Pulmonary effort is normal. No respiratory distress.     Breath sounds: Normal breath sounds. No wheezing or rales.  Chest:     Chest wall: No tenderness.  Abdominal:     General: Bowel sounds are normal.     Palpations: Abdomen is soft.     Tenderness: There is no abdominal tenderness. There is no guarding.  Musculoskeletal: Normal range of motion.  Lymphadenopathy:     Cervical: Cervical adenopathy present.  Skin:    General: Skin is warm and dry.  Neurological:     Mental Status: She is alert and oriented to person, place, and time.     Cranial Nerves: No cranial nerve deficit.  Psychiatric:        Behavior: Behavior normal.        Thought Content: Thought content normal.        Judgment: Judgment normal.   Assessment/Plan: 1. Fever and chills - POC INFLUENZA TEST negative   2. Acute upper respiratory infection Start ceftin 500mg  bid for 10  days. Rest and increase fluids. Take OTC medications as needed to reduce symptoms. Start prednisone taper on Friday if symptoms still significant.  - predniSONE (STERAPRED UNI-PAK 21 TAB) 10 MG (21) TBPK tablet; 6 day taper - take by mouth as directed for 6 days  Dispense: 21 tablet; Refill: 0 - cefUROXime (CEFTIN) 500 MG tablet; Take 1 tablet (500 mg total) by mouth 2 (two) times daily with a meal.  Dispense: 20 tablet; Refill: 0  3. Fatigue, unspecified type - POC INFLUENZA TEST negative. Likely from uri. Treat with abx. Rest and increase fluids.   General Counseling:  Ashlen verbalizes understanding of the findings of todays visit and agrees with plan of treatment. I have discussed any further diagnostic evaluation that may be needed or ordered today. We also reviewed her medications today. she has been encouraged to call the office with any questions or concerns that should arise related to todays visit.    Counseling:  Rest and increase fluids. Continue using OTC medication to control symptoms.   This patient was seen by Leretha Pol FNP Collaboration with Dr Lavera Guise as a part of collaborative care agreement  Orders Placed This Encounter  Procedures  . POC INFLUENZA TEST    Meds ordered this encounter  Medications  . predniSONE (STERAPRED UNI-PAK 21 TAB) 10 MG (21) TBPK tablet    Sig: 6 day taper - take by mouth as directed for 6 days    Dispense:  21 tablet    Refill:  0    Order Specific Question:   Supervising Provider    Answer:   Lavera Guise [place]  . cefUROXime (CEFTIN) 500 MG tablet    Sig: Take 1 tablet (500 mg total) by mouth 2 (two) times daily with a meal.    Dispense:  20 tablet    Refill:  0    Order Specific Question:   Supervising Provider    Answer:   Lavera Guise [[id number]]    Time spent: 25 Minutes

## 2018-10-20 DIAGNOSIS — J069 Acute upper respiratory infection, unspecified: Secondary | ICD-10-CM | POA: Insufficient documentation

## 2018-10-20 DIAGNOSIS — R5383 Other fatigue: Secondary | ICD-10-CM | POA: Insufficient documentation

## 2018-10-20 DIAGNOSIS — R509 Fever, unspecified: Secondary | ICD-10-CM | POA: Insufficient documentation

## 2018-11-29 ENCOUNTER — Other Ambulatory Visit: Payer: Self-pay

## 2018-11-29 DIAGNOSIS — F321 Major depressive disorder, single episode, moderate: Secondary | ICD-10-CM

## 2018-11-29 MED ORDER — FLUOXETINE HCL 10 MG PO TABS: 10.0000 mg | ORAL_TABLET | Freq: Every day | ORAL | 0 refills | Status: DC

## 2018-11-30 ENCOUNTER — Ambulatory Visit: Payer: BC Managed Care – PPO | Admitting: Nurse Practitioner

## 2018-11-30 VITALS — BP 121/70 | HR 80 | Temp 97.7°F | Resp 16 | Ht 70.0 in | Wt 215.4 lb

## 2018-11-30 DIAGNOSIS — R05 Cough: Secondary | ICD-10-CM

## 2018-11-30 DIAGNOSIS — R509 Fever, unspecified: Secondary | ICD-10-CM | POA: Diagnosis not present

## 2018-11-30 DIAGNOSIS — J069 Acute upper respiratory infection, unspecified: Secondary | ICD-10-CM | POA: Diagnosis not present

## 2018-11-30 DIAGNOSIS — R059 Cough, unspecified: Secondary | ICD-10-CM

## 2018-11-30 DIAGNOSIS — F321 Major depressive disorder, single episode, moderate: Secondary | ICD-10-CM

## 2018-11-30 LAB — POC INFLUENZA TEST
Negative: NEGATIVE
Positive: NEGATIVE

## 2018-11-30 MED ORDER — LEVOFLOXACIN 500 MG PO TABS: 500.0000 mg | ORAL_TABLET | Freq: Every day | ORAL | 0 refills | Status: DC

## 2018-11-30 MED ORDER — HYDROCOD POLST-CPM POLST ER 10-8 MG/5ML PO SUER: 5.0000 mL | Freq: Two times a day (BID) | ORAL | 0 refills | Status: DC | PRN

## 2018-11-30 MED ORDER — FLUOXETINE HCL 10 MG PO TABS: 10.0000 mg | ORAL_TABLET | Freq: Every day | ORAL | 3 refills | Status: DC

## 2018-11-30 NOTE — Progress Notes (Signed)
[facility] [address] [place], [place] [address]  Internal MEDICINE  Office Visit Note  Patient Name: Linda Vargas  [id number]  [id number]  Date of Service: 12/12/2018  Chief Complaint  Patient presents with  . Medical Management of Chronic Issues    3 month follow up  . Hypertension  . Sinusitis  . Cough  . Fever    Sinusitis  This is a recurrent problem. The current episode started in the past 7 days. The problem has been gradually worsening since onset. The maximum temperature recorded prior to her arrival was 100.4 - 100.9 F. The fever has been present for 1 to 2 days. Her pain is at a severity of 4/10. Associated symptoms include chills, congestion, coughing, ear pain, headaches, sinus pressure, a sore throat and swollen glands. Pertinent negatives include no diaphoresis or neck pain. Past treatments include acetaminophen, lying down and oral decongestants. The treatment provided mild relief.       Current Medication: Outpatient Encounter Medications as of 11/30/2018  Medication Sig  . acetaminophen (TYLENOL) 500 MG tablet Take 1,000 mg by mouth every 8 (eight) hours as needed for headache.   . ALPRAZolam (XANAX) 0.25 MG tablet Take 1 tablet (0.25 mg total) by mouth 2 (two) times daily as needed for anxiety.  . Azelastine HCl 0.15 % SOLN INL 2 PFS IEN QD  . cetirizine (ZYRTEC) 10 MG tablet Take 10 mg by mouth daily.   Marland Kitchen EPIPEN 2-PAK 0.3 MG/0.3ML SOAJ injection INJECT INTRAMUSCULARLY AS DIRECTED FOR ANAPHYLATIC REACTIONS  . fexofenadine (ALLEGRA ALLERGY) 180 MG tablet Take 180 mg by mouth daily.  Marland Kitchen FLUoxetine (PROZAC) 10 MG tablet Take 1 tablet (10 mg total) by mouth daily.  . hydrochlorothiazide (HYDRODIURIL) 25 MG tablet Take 1 tablet (25 mg total) by mouth daily.  . hydroxychloroquine (PLAQUENIL) 200 MG tablet Take 200 mg by mouth 2 (two) times daily.    Marland Kitchen losartan (COZAAR) 50 MG tablet Take 1 tablet (50 mg total) by mouth daily.  . mometasone  (ELOCON) 0.1 % lotion APP TO ITCHY EARS BID FOR 12 DAYS. MAY REPEAT PRF ITCHY EARS.  Marland Kitchen traMADol (ULTRAM) 50 MG tablet   . [DISCONTINUED] cefUROXime (CEFTIN) 500 MG tablet Take 1 tablet (500 mg total) by mouth 2 (two) times daily with a meal.  . [DISCONTINUED] FLUoxetine (PROZAC) 10 MG tablet Take 1 tablet (10 mg total) by mouth daily.  . [DISCONTINUED] guaiFENesin-codeine (ROBITUSSIN AC) 100-10 MG/5ML syrup Take 5 mLs by mouth at bedtime as needed for cough.  . chlorpheniramine-HYDROcodone (TUSSIONEX PENNKINETIC ER) 10-8 MG/5ML SUER Take 5 mLs by mouth every 12 (twelve) hours as needed for cough.  Marland Kitchen levofloxacin (LEVAQUIN) 500 MG tablet Take 1 tablet (500 mg total) by mouth daily.  . [DISCONTINUED] azithromycin (ZITHROMAX) 250 MG tablet Take 2 tabs PO on Day 1.  Take 1 Tab PO on days 2-5 (Patient not taking: Reported on 10/19/2018)  . [DISCONTINUED] predniSONE (STERAPRED UNI-PAK 21 TAB) 10 MG (21) TBPK tablet 6 day taper - take by mouth as directed for 6 days (Patient not taking: Reported on 11/30/2018)   No facility-administered encounter medications on file as of 11/30/2018.     Surgical History: Past Surgical History:  Procedure Laterality Date  . CHOLECYSTECTOMY  2012  . DILATION AND CURETTAGE OF UTERUS  2011  . DILATION AND CURETTAGE OF UTERUS  01/17/2012   Procedure: DILATATION AND CURETTAGE;  Surgeon: Marylynn Pearson, MD;  Location: [facility];  Service: Gynecology;  Laterality: N/A;  Dilatation and curratage, insertion of Bakri Balloon  . FOOT SURGERY    . HARDWARE REMOVAL Right 02/18/2018   Procedure: HARDWARE REMOVAL RIGHT WRIST;  Surgeon: Hessie Knows, MD;  Location: [facility];  Service: Orthopedics;  Laterality: Right;  . OPEN REDUCTION INTERNAL FIXATION (ORIF) DISTAL RADIAL FRACTURE Right 11/26/2017   Procedure: OPEN REDUCTION INTERNAL FIXATION (ORIF) DISTAL RADIAL FRACTURE;  Surgeon: Hessie Knows, MD;  Location: [facility];  Service: Orthopedics;  Laterality: Right;  . TONSILLECTOMY    .  WRIST SURGERY Right 02/18/2018    Medical History: Past Medical History:  Diagnosis Date  . Anxiety   . Connective tissue disorder (HCC)    undifferentiated  . Fibromyalgia   . Hypertension   . Lupus ([place])   . Migraine   . Seasonal allergic rhinitis     Family History: Family History  Problem Relation Age of Onset  . Hypertension Mother   . Hypertension Father   . Diabetes Father   . Heart disease Maternal Grandmother   . Heart disease Paternal Grandmother   . Anesthesia problems Neg Hx     Social History   Socioeconomic History  . Marital status: Single    Spouse name: Not on file  . Number of children: Not on file  . Years of education: Not on file  . Highest education level: Not on file  Occupational History  . Not on file  Social Needs  . Financial resource strain: Not on file  . Food insecurity:    Worry: Not on file    Inability: Not on file  . Transportation needs:    Medical: Not on file    Non-medical: Not on file  Tobacco Use  . Smoking status: Never Smoker  . Smokeless tobacco: Never Used  Substance and Sexual Activity  . Alcohol use: No  . Drug use: No  . Sexual activity: Not on file  Lifestyle  . Physical activity:    Days per week: Not on file    Minutes per session: Not on file  . Stress: Not on file  Relationships  . Social connections:    Talks on phone: Not on file    Gets together: Not on file    Attends religious service: Not on file    Active member of club or organization: Not on file    Attends meetings of clubs or organizations: Not on file    Relationship status: Not on file  . Intimate partner violence:    Fear of current or ex partner: Not on file    Emotionally abused: Not on file    Physically abused: Not on file    Forced sexual activity: Not on file  Other Topics Concern  . Not on file  Social History Narrative   Lives at home.      Review of Systems  Constitutional: Positive for chills and fatigue. Negative  for appetite change, diaphoresis and fever.  HENT: Positive for congestion, ear pain, postnasal drip, rhinorrhea, sinus pressure, sinus pain, sore throat and voice change.   Respiratory: Positive for cough and wheezing.   Cardiovascular: Negative for chest pain and palpitations.  Gastrointestinal: Positive for nausea. Negative for vomiting.  Musculoskeletal: Negative for back pain and neck pain.  Skin: Negative.   Allergic/Immunologic: Positive for environmental allergies.  Neurological: Positive for headaches.  Hematological: Positive for adenopathy.  Psychiatric/Behavioral: Negative for agitation and dysphoric mood. The patient is not nervous/anxious.    Today's Vitals   11/30/18 1615  BP:  121/70  Pulse: 80  Resp: 16  Temp: 97.7 F (36.5 C)  SpO2: 100%  Weight: 215 lb 6.4 oz (97.7 kg)  Height: 5\' 10"  (1.778 m)   Body mass index is 30.91 kg/m.  Physical Exam Vitals signs and nursing note reviewed.  Constitutional:      General: She is not in acute distress.    Appearance: She is well-developed. She is ill-appearing. She is not diaphoretic.  HENT:     Head: Normocephalic and atraumatic.     Right Ear: Tympanic membrane is bulging.     Left Ear: Tympanic membrane is bulging.     Nose: Congestion and rhinorrhea present.     Right Sinus: Maxillary sinus tenderness and frontal sinus tenderness present.     Left Sinus: Maxillary sinus tenderness and frontal sinus tenderness present.     Mouth/Throat:     Pharynx: Posterior oropharyngeal erythema present. No oropharyngeal exudate.  Eyes:     Conjunctiva/sclera: Conjunctivae normal.     Pupils: Pupils are equal, round, and reactive to light.  Neck:     Musculoskeletal: Normal range of motion and neck supple.     Thyroid: No thyromegaly.     Vascular: No JVD.     Trachea: No tracheal deviation.  Cardiovascular:     Rate and Rhythm: Normal rate and regular rhythm.     Heart sounds: Normal heart sounds. No murmur. No friction  rub. No gallop.   Pulmonary:     Effort: Pulmonary effort is normal. No respiratory distress.     Breath sounds: Normal breath sounds. No wheezing or rales.  Chest:     Chest wall: No tenderness.  Abdominal:     General: Bowel sounds are normal.     Palpations: Abdomen is soft.     Tenderness: There is no abdominal tenderness. There is no guarding.  Musculoskeletal: Normal range of motion.  Lymphadenopathy:     Cervical: Cervical adenopathy present.  Skin:    General: Skin is warm and dry.  Neurological:     Mental Status: She is alert and oriented to person, place, and time.     Cranial Nerves: No cranial nerve deficit.  Psychiatric:        Behavior: Behavior normal.        Thought Content: Thought content normal.        Judgment: Judgment normal.    Assessment/Plan: 1. Fever and chills - POC INFLUENZA TEST negative today. Will treat as acute upper respiratory infection.   2. Acute upper respiratory infection Recurrent. Start levofloxacin 500mg  daily for 10 days. Rest and increase fluids. OTC medication recommended to improve symptoms.  - levofloxacin (LEVAQUIN) 500 MG tablet; Take 1 tablet (500 mg total) by mouth daily.  Dispense: 10 tablet; Refill: 0  3. Cough tussionex may be taken twice daily as needed for cough. Advised patient not to overuse this medicine and not to mix with other medications or alcohol as it can cause respiratory distress, sleepiness or dizziness. Should also avoid driving. Patient voiced understanding and agreement.  - chlorpheniramine-HYDROcodone (TUSSIONEX PENNKINETIC ER) 10-8 MG/5ML SUER; Take 5 mLs by mouth every 12 (twelve) hours as needed for cough.  Dispense: 115 mL; Refill: 0  4. Depression, major, single episode, moderate (HCC) Improved. Patient plans to try and wean herself off this medication. Written instructions provided for patient to wean herself off safely.  - FLUoxetine (PROZAC) 10 MG tablet; Take 1 tablet (10 mg total) by mouth daily.   Dispense: 30  tablet; Refill: 3  General Counseling: Mistie verbalizes understanding of the findings of todays visit and agrees with plan of treatment. I have discussed any further diagnostic evaluation that may be needed or ordered today. We also reviewed her medications today. she has been encouraged to call the office with any questions or concerns that should arise related to todays visit.  This patient was seen by Leretha Pol FNP Collaboration with Dr Lavera Guise as a part of collaborative care agreement  Orders Placed This Encounter  Procedures  . POC INFLUENZA TEST    Meds ordered this encounter  Medications  . FLUoxetine (PROZAC) 10 MG tablet    Sig: Take 1 tablet (10 mg total) by mouth daily.    Dispense:  30 tablet    Refill:  3    Please add refills to prescription sent in 11/29/2018    Order Specific Question:   Supervising Provider    Answer:   Lavera Guise [[id number]]  . levofloxacin (LEVAQUIN) 500 MG tablet    Sig: Take 1 tablet (500 mg total) by mouth daily.    Dispense:  10 tablet    Refill:  0    Order Specific Question:   Supervising Provider    Answer:   Lavera Guise [[id number]]  . chlorpheniramine-HYDROcodone (TUSSIONEX PENNKINETIC ER) 10-8 MG/5ML SUER    Sig: Take 5 mLs by mouth every 12 (twelve) hours as needed for cough.    Dispense:  115 mL    Refill:  0    Order Specific Question:   Supervising Provider    Answer:   Lavera Guise [[id number]]    Time spent: 104 Minutes      Dr Lavera Guise Internal medicine

## 2018-12-12 ENCOUNTER — Encounter: Payer: Self-pay | Admitting: Nurse Practitioner

## 2018-12-12 DIAGNOSIS — R05 Cough: Secondary | ICD-10-CM | POA: Insufficient documentation

## 2018-12-12 DIAGNOSIS — R059 Cough, unspecified: Secondary | ICD-10-CM | POA: Insufficient documentation

## 2018-12-15 ENCOUNTER — Other Ambulatory Visit: Payer: Self-pay | Admitting: Obstetrics and Gynecology

## 2018-12-15 DIAGNOSIS — R928 Other abnormal and inconclusive findings on diagnostic imaging of breast: Secondary | ICD-10-CM

## 2018-12-22 ENCOUNTER — Ambulatory Visit
Admission: RE | Admit: 2018-12-22 | Discharge: 2018-12-22 | Disposition: A | Discharge status: Home / Self Care | Payer: BC Managed Care – PPO | Source: Ambulatory Visit | Attending: Obstetrics and Gynecology | Admitting: Obstetrics and Gynecology

## 2018-12-22 ENCOUNTER — Other Ambulatory Visit: Payer: Self-pay

## 2018-12-22 ENCOUNTER — Other Ambulatory Visit: Payer: Self-pay | Admitting: Obstetrics and Gynecology

## 2018-12-22 DIAGNOSIS — R928 Other abnormal and inconclusive findings on diagnostic imaging of breast: Secondary | ICD-10-CM

## 2018-12-22 DIAGNOSIS — N6489 Other specified disorders of breast: Secondary | ICD-10-CM

## 2019-01-09 ENCOUNTER — Telehealth: Payer: BC Managed Care – PPO | Admitting: Family

## 2019-01-09 DIAGNOSIS — R112 Nausea with vomiting, unspecified: Secondary | ICD-10-CM | POA: Diagnosis not present

## 2019-01-09 MED ORDER — ONDANSETRON 4 MG PO TBDP: 4.0000 mg | ORAL_TABLET | Freq: Three times a day (TID) | ORAL | 0 refills | Status: DC | PRN

## 2019-01-09 NOTE — Progress Notes (Signed)

## 2019-02-03 ENCOUNTER — Other Ambulatory Visit: Payer: Self-pay

## 2019-02-03 MED ORDER — LOSARTAN POTASSIUM 50 MG PO TABS: 50.0000 mg | ORAL_TABLET | Freq: Every day | ORAL | 2 refills | Status: DC

## 2019-03-29 ENCOUNTER — Other Ambulatory Visit: Payer: Self-pay

## 2019-03-29 DIAGNOSIS — I10 Essential (primary) hypertension: Secondary | ICD-10-CM

## 2019-03-29 MED ORDER — HYDROCHLOROTHIAZIDE 25 MG PO TABS: 25.0000 mg | ORAL_TABLET | Freq: Every day | ORAL | 0 refills | Status: DC

## 2019-03-31 ENCOUNTER — Ambulatory Visit: Payer: BC Managed Care – PPO | Admitting: Nurse Practitioner

## 2019-03-31 ENCOUNTER — Other Ambulatory Visit: Payer: Self-pay

## 2019-03-31 ENCOUNTER — Encounter: Payer: Self-pay | Admitting: Nurse Practitioner

## 2019-03-31 VITALS — BP 130/75 | HR 87 | Resp 16 | Ht 69.0 in | Wt 224.0 lb

## 2019-03-31 DIAGNOSIS — E538 Deficiency of other specified B group vitamins: Secondary | ICD-10-CM | POA: Diagnosis not present

## 2019-03-31 DIAGNOSIS — I10 Essential (primary) hypertension: Secondary | ICD-10-CM

## 2019-03-31 DIAGNOSIS — R5383 Other fatigue: Secondary | ICD-10-CM | POA: Diagnosis not present

## 2019-03-31 NOTE — Progress Notes (Signed)
[facility] [place], [place] [address]  Internal MEDICINE  Office Visit Note  Patient Name: Linda Vargas  [id number]  [id number]  Date of Service: 04/03/2019  Chief Complaint  Patient presents with  . Medical Management of Chronic Issues    4 month follow up, pt inquiring about B12 injections , have had them before. pt has noticed some tingling in left foot  . Anxiety  . Hypertension  . Fatigue    notice a lot of exaustion, and have been dealing with anemia issues,     The patient states that she is having overwhelming fatigue. Has been going on for over a month. She states that she used to have this problem in the past. Had b12 deficiency. She states that she saw her gynecologist earlier this year. Fingerprick showed that she was anemic. They did not take testing any further. She denies chest pain, chest pressure, or shortness of breath. She states that her menstrual cycles are very heavy. Will have more than once cycle per month. GYN did do hormonal work up which came back normal. She is not candidate for birth control due to lupus. Blood pressure is well controlled. She is otherwise feeling well.       Current Medication: Outpatient Encounter Medications as of 03/31/2019  Medication Sig  . acetaminophen (TYLENOL) 500 MG tablet Take 1,000 mg by mouth every 8 (eight) hours as needed for headache.   . ALPRAZolam (XANAX) 0.25 MG tablet Take 1 tablet (0.25 mg total) by mouth 2 (two) times daily as needed for anxiety.  . Azelastine HCl 0.15 % SOLN INL 2 PFS IEN QD  . cetirizine (ZYRTEC) 10 MG tablet Take 10 mg by mouth daily.   . chlorpheniramine-HYDROcodone (TUSSIONEX PENNKINETIC ER) 10-8 MG/5ML SUER Take 5 mLs by mouth every 12 (twelve) hours as needed for cough.  . EPIPEN 2-PAK 0.3 MG/0.3ML SOAJ injection INJECT INTRAMUSCULARLY AS DIRECTED FOR ANAPHYLATIC REACTIONS  . fexofenadine (ALLEGRA ALLERGY) 180 MG tablet Take 180 mg by mouth daily.  Marland Kitchen  FLUoxetine (PROZAC) 10 MG tablet Take 1 tablet (10 mg total) by mouth daily.  . hydrochlorothiazide (HYDRODIURIL) 25 MG tablet Take 1 tablet (25 mg total) by mouth daily.  . hydroxychloroquine (PLAQUENIL) 200 MG tablet Take 200 mg by mouth 2 (two) times daily.    Marland Kitchen levofloxacin (LEVAQUIN) 500 MG tablet Take 1 tablet (500 mg total) by mouth daily.  Marland Kitchen losartan (COZAAR) 50 MG tablet Take 1 tablet (50 mg total) by mouth daily.  . mometasone (ELOCON) 0.1 % lotion APP TO ITCHY EARS BID FOR 12 DAYS. MAY REPEAT PRF ITCHY EARS.  Marland Kitchen ondansetron (ZOFRAN ODT) 4 MG disintegrating tablet Take 1 tablet (4 mg total) by mouth every 8 (eight) hours as needed for nausea or vomiting.  . traMADol (ULTRAM) 50 MG tablet    No facility-administered encounter medications on file as of 03/31/2019.     Surgical History: Past Surgical History:  Procedure Laterality Date  . CHOLECYSTECTOMY  2012  . DILATION AND CURETTAGE OF UTERUS  2011  . DILATION AND CURETTAGE OF UTERUS  01/17/2012   Procedure: DILATATION AND CURETTAGE;  Surgeon: Marylynn Pearson, MD;  Location: [facility];  Service: Gynecology;  Laterality: N/A;  Dilatation and curratage, insertion of Bakri Balloon  . FOOT SURGERY    . HARDWARE REMOVAL Right 02/18/2018   Procedure: HARDWARE REMOVAL RIGHT WRIST;  Surgeon: Hessie Knows, MD;  Location: [facility];  Service: Orthopedics;  Laterality: Right;  . OPEN  REDUCTION INTERNAL FIXATION (ORIF) DISTAL RADIAL FRACTURE Right 11/26/2017   Procedure: OPEN REDUCTION INTERNAL FIXATION (ORIF) DISTAL RADIAL FRACTURE;  Surgeon: Hessie Knows, MD;  Location: [facility];  Service: Orthopedics;  Laterality: Right;  . TONSILLECTOMY    . WRIST SURGERY Right 02/18/2018    Medical History: Past Medical History:  Diagnosis Date  . Anxiety   . Connective tissue disorder (HCC)    undifferentiated  . Fibromyalgia   . Hypertension   . Lupus ([place])   . Migraine   . Seasonal allergic rhinitis     Family History: Family History  Problem  Relation Age of Onset  . Hypertension Mother   . Hypertension Father   . Diabetes Father   . Heart disease Maternal Grandmother   . Heart disease Paternal Grandmother   . Anesthesia problems Neg Hx     Social History   Socioeconomic History  . Marital status: Single    Spouse name: Not on file  . Number of children: Not on file  . Years of education: Not on file  . Highest education level: Not on file  Occupational History  . Not on file  Social Needs  . Financial resource strain: Not on file  . Food insecurity    Worry: Not on file    Inability: Not on file  . Transportation needs    Medical: Not on file    Non-medical: Not on file  Tobacco Use  . Smoking status: Never Smoker  . Smokeless tobacco: Never Used  Substance and Sexual Activity  . Alcohol use: No  . Drug use: No  . Sexual activity: Not on file  Lifestyle  . Physical activity    Days per week: Not on file    Minutes per session: Not on file  . Stress: Not on file  Relationships  . Social Herbalist on phone: Not on file    Gets together: Not on file    Attends religious service: Not on file    Active member of club or organization: Not on file    Attends meetings of clubs or organizations: Not on file    Relationship status: Not on file  . Intimate partner violence    Fear of current or ex partner: Not on file    Emotionally abused: Not on file    Physically abused: Not on file    Forced sexual activity: Not on file  Other Topics Concern  . Not on file  Social History Narrative   Lives at home.      Review of Systems  Constitutional: Positive for fatigue. Negative for chills and unexpected weight change.  HENT: Negative for congestion, postnasal drip, rhinorrhea, sneezing and sore throat.   Respiratory: Negative for cough, chest tightness, shortness of breath and wheezing.   Cardiovascular: Negative for chest pain and palpitations.  Gastrointestinal: Negative for abdominal pain,  constipation, diarrhea, nausea and vomiting.  Endocrine: Positive for cold intolerance. Negative for heat intolerance, polydipsia and polyuria.  Musculoskeletal: Positive for arthralgias and myalgias. Negative for back pain, joint swelling and neck pain.  Skin: Negative for rash.  Allergic/Immunologic: Positive for environmental allergies.  Neurological: Negative for dizziness, tremors, numbness and headaches.  Hematological: Negative for adenopathy. Does not bruise/bleed easily.  Psychiatric/Behavioral: Negative for behavioral problems (Depression), sleep disturbance and suicidal ideas. The patient is not nervous/anxious.     Today's Vitals   03/31/19 1615  BP: 130/75  Pulse: 87  Resp: 16  SpO2: 98%  Weight: 224 lb (101.6 kg)  Height: 5\' 9"  (1.753 m)   Body mass index is 33.08 kg/m.   Physical Exam Vitals signs and nursing note reviewed.  Constitutional:      General: She is not in acute distress.    Appearance: Normal appearance. She is well-developed. She is not diaphoretic.  HENT:     Head: Normocephalic and atraumatic.     Mouth/Throat:     Pharynx: No oropharyngeal exudate.  Eyes:     Pupils: Pupils are equal, round, and reactive to light.  Neck:     Musculoskeletal: Normal range of motion and neck supple.     Thyroid: No thyromegaly.     Vascular: No carotid bruit or JVD.     Trachea: No tracheal deviation.  Cardiovascular:     Rate and Rhythm: Normal rate and regular rhythm.     Heart sounds: Normal heart sounds. No murmur. No friction rub. No gallop.   Pulmonary:     Effort: Pulmonary effort is normal. No respiratory distress.     Breath sounds: Normal breath sounds. No wheezing or rales.  Chest:     Chest wall: No tenderness.  Abdominal:     General: Bowel sounds are normal.     Palpations: Abdomen is soft.     Tenderness: There is no abdominal tenderness.  Musculoskeletal: Normal range of motion.  Lymphadenopathy:     Cervical: No cervical adenopathy.   Skin:    General: Skin is warm and dry.  Neurological:     Mental Status: She is alert and oriented to person, place, and time.     Cranial Nerves: No cranial nerve deficit.  Psychiatric:        Behavior: Behavior normal.        Thought Content: Thought content normal.        Judgment: Judgment normal.    Assessment/Plan: 1. Essential hypertension Stable. Continue blood pressure medication as prescribed   2. Fatigue, unspecified type Will check labs including full anemia and thyroid panels.   3. Vitamin B12 deficiency Check full anemia panel and treat as indicated.   General Counseling: Nanda verbalizes understanding of the findings of todays visit and agrees with plan of treatment. I have discussed any further diagnostic evaluation that may be needed or ordered today. We also reviewed her medications today. she has been encouraged to call the office with any questions or concerns that should arise related to todays visit.  Hypertension Counseling:   The following hypertensive lifestyle modification were recommended and discussed:  1. Limiting alcohol intake to less than 1 oz/day of ethanol:(24 oz of beer or 8 oz of wine or 2 oz of 100-proof whiskey). 2. Take baby ASA 81 mg daily. 3. Importance of regular aerobic exercise and losing weight. 4. Reduce dietary saturated fat and cholesterol intake for overall cardiovascular health. 5. Maintaining adequate dietary potassium, calcium, and magnesium intake. 6. Regular monitoring of the blood pressure. 7. Reduce sodium intake to less than 100 mmol/day (less than 2.3 gm of sodium or less than 6 gm of sodium choride)   This patient was seen by [place] with Dr Lavera Guise as a part of collaborative care agreement  Time spent: 25 Minutes      Dr Lavera Guise Internal medicine

## 2019-04-03 DIAGNOSIS — E538 Deficiency of other specified B group vitamins: Secondary | ICD-10-CM | POA: Insufficient documentation

## 2019-04-04 ENCOUNTER — Other Ambulatory Visit: Payer: Self-pay | Admitting: Nurse Practitioner

## 2019-04-04 LAB — LIPID PANEL W/O CHOL/HDL RATIO

## 2019-04-05 LAB — LIPID PANEL W/O CHOL/HDL RATIO
Cholesterol, Total: 198 mg/dL (ref 100–199)
HDL: 86 mg/dL (ref >39)
LDL Calculated: 100 mg/dL — ABNORMAL HIGH (ref 0–99)
Triglycerides: 60 mg/dL (ref 0–149)
VLDL Cholesterol Cal: 12 mg/dL (ref 5–40)

## 2019-04-05 LAB — T4, FREE: Free T4: 1.24 ng/dL (ref 0.82–1.77)

## 2019-04-05 LAB — B12 AND FOLATE PANEL
Folate: 5.1 ng/mL (ref >3.0)
Vitamin B-12: 311 pg/mL (ref 232–1245)

## 2019-04-05 LAB — COMPREHENSIVE METABOLIC PANEL
ALT: 33 IU/L — ABNORMAL HIGH (ref 0–32)
AST: 32 IU/L (ref 0–40)
Albumin/Globulin Ratio: 1.3 (ref 1.2–2.2)
Albumin: 4.2 g/dL (ref 3.8–4.8)
Alkaline Phosphatase: 79 IU/L (ref 39–117)
BUN/Creatinine Ratio: 11 (ref 9–23)
BUN: 12 mg/dL (ref 6–24)
Bilirubin Total: 0.4 mg/dL (ref 0.0–1.2)
CO2: 26 mmol/L (ref 20–29)
Calcium: 9.7 mg/dL (ref 8.7–10.2)
Chloride: 96 mmol/L (ref 96–106)
Creatinine, Ser: 1.14 mg/dL — ABNORMAL HIGH (ref 0.57–1.00)
GFR calc Af Amer: 69 mL/min/{1.73_m2} (ref >59)
GFR calc non Af Amer: 60 mL/min/{1.73_m2} (ref >59)
Globulin, Total: 3.2 g/dL (ref 1.5–4.5)
Glucose: 88 mg/dL (ref 65–99)
Potassium: 3.4 mmol/L — ABNORMAL LOW (ref 3.5–5.2)
Sodium: 137 mmol/L (ref 134–144)
Total Protein: 7.4 g/dL (ref 6.0–8.5)

## 2019-04-05 LAB — CBC
Hematocrit: 36.3 % (ref 34.0–46.6)
Hemoglobin: 12.4 g/dL (ref 11.1–15.9)
MCH: 28.2 pg (ref 26.6–33.0)
MCHC: 34.2 g/dL (ref 31.5–35.7)
MCV: 83 fL (ref 79–97)
Platelets: 383 10*3/uL (ref 150–450)
RBC: 4.4 x10E6/uL (ref 3.77–5.28)
RDW: 11.8 % (ref 11.7–15.4)
WBC: 4.6 10*3/uL (ref 3.4–10.8)

## 2019-04-05 LAB — IRON AND TIBC
Iron Saturation: 19 % (ref 15–55)
Iron: 62 ug/dL (ref 27–159)
Total Iron Binding Capacity: 319 ug/dL (ref 250–450)
UIBC: 257 ug/dL (ref 131–425)

## 2019-04-05 LAB — TSH: TSH: 2.89 u[IU]/mL (ref 0.450–4.500)

## 2019-04-05 LAB — VITAMIN D 25 HYDROXY (VIT D DEFICIENCY, FRACTURES): Vit D, 25-Hydroxy: 11.1 ng/mL — ABNORMAL LOW (ref 30.0–100.0)

## 2019-04-05 LAB — FERRITIN: Ferritin: 44 ng/mL (ref 15–150)

## 2019-04-11 ENCOUNTER — Encounter: Payer: Self-pay | Admitting: Nurse Practitioner

## 2019-04-11 ENCOUNTER — Ambulatory Visit: Payer: BC Managed Care – PPO | Admitting: Nurse Practitioner

## 2019-04-11 ENCOUNTER — Other Ambulatory Visit: Payer: Self-pay

## 2019-04-11 VITALS — BP 112/66 | HR 79 | Temp 99.0°F | Resp 16 | Ht 69.0 in | Wt 222.4 lb

## 2019-04-11 DIAGNOSIS — I10 Essential (primary) hypertension: Secondary | ICD-10-CM | POA: Diagnosis not present

## 2019-04-11 DIAGNOSIS — R5383 Other fatigue: Secondary | ICD-10-CM | POA: Diagnosis not present

## 2019-04-11 DIAGNOSIS — J0140 Acute pansinusitis, unspecified: Secondary | ICD-10-CM | POA: Diagnosis not present

## 2019-04-11 DIAGNOSIS — E559 Vitamin D deficiency, unspecified: Secondary | ICD-10-CM | POA: Diagnosis not present

## 2019-04-11 MED ORDER — AMOXICILLIN-POT CLAVULANATE 875-125 MG PO TABS: 1.0000 | ORAL_TABLET | Freq: Two times a day (BID) | ORAL | 0 refills | Status: DC

## 2019-04-11 MED ORDER — ERGOCALCIFEROL 1.25 MG (50000 UT) PO CAPS: 50000.0000 [IU] | ORAL_CAPSULE | ORAL | 5 refills | Status: DC

## 2019-04-11 NOTE — Progress Notes (Signed)
[facility] [place], [place] [address]  Internal MEDICINE  Office Visit Note  Patient Name: Linda Vargas  [id number]  [id number]  Date of Service: 04/11/2019  Chief Complaint  Patient presents with  . Otitis Media    ear drainage, pain, feels like it started after getting root canal last thursday  . Results    labs  . Dizziness     The patient is here for sick visit. States that she had root canal on Thursday. Then woke up Friday with both ears hurting. There was drainage with odor present. Felt dizzy off and on. She has a little bit of nasal congestion. Denies fever or headache. She had routine, fasting labs drawn as well as anemia panel drawn prior to this visit. She is also here to review the results.   Pt is here for a sick visit.     Current Medication:  Outpatient Encounter Medications as of 04/11/2019  Medication Sig  . acetaminophen (TYLENOL) 500 MG tablet Take 1,000 mg by mouth every 8 (eight) hours as needed for headache.   . ALPRAZolam (XANAX) 0.25 MG tablet Take 1 tablet (0.25 mg total) by mouth 2 (two) times daily as needed for anxiety.  . Azelastine HCl 0.15 % SOLN INL 2 PFS IEN QD  . cetirizine (ZYRTEC) 10 MG tablet Take 10 mg by mouth daily.   . chlorpheniramine-HYDROcodone (TUSSIONEX PENNKINETIC ER) 10-8 MG/5ML SUER Take 5 mLs by mouth every 12 (twelve) hours as needed for cough.  . EPIPEN 2-PAK 0.3 MG/0.3ML SOAJ injection INJECT INTRAMUSCULARLY AS DIRECTED FOR ANAPHYLATIC REACTIONS  . fexofenadine (ALLEGRA ALLERGY) 180 MG tablet Take 180 mg by mouth daily.  Marland Kitchen FLUoxetine (PROZAC) 10 MG tablet Take 1 tablet (10 mg total) by mouth daily.  . hydrochlorothiazide (HYDRODIURIL) 25 MG tablet Take 1 tablet (25 mg total) by mouth daily.  . hydroxychloroquine (PLAQUENIL) 200 MG tablet Take 200 mg by mouth 2 (two) times daily.    Marland Kitchen levofloxacin (LEVAQUIN) 500 MG tablet Take 1 tablet (500 mg total) by mouth daily.  Marland Kitchen losartan (COZAAR) 50  MG tablet Take 1 tablet (50 mg total) by mouth daily.  . mometasone (ELOCON) 0.1 % lotion APP TO ITCHY EARS BID FOR 12 DAYS. MAY REPEAT PRF ITCHY EARS.  Marland Kitchen ondansetron (ZOFRAN ODT) 4 MG disintegrating tablet Take 1 tablet (4 mg total) by mouth every 8 (eight) hours as needed for nausea or vomiting.  . traMADol (ULTRAM) 50 MG tablet   . amoxicillin-clavulanate (AUGMENTIN) 875-125 MG tablet Take 1 tablet by mouth 2 (two) times daily.  . ergocalciferol (DRISDOL) 1.25 MG (50000 UT) capsule Take 1 capsule (50,000 Units total) by mouth once a week.   No facility-administered encounter medications on file as of 04/11/2019.       Medical History: Past Medical History:  Diagnosis Date  . Anxiety   . Connective tissue disorder (HCC)    undifferentiated  . Fibromyalgia   . Hypertension   . Lupus ([place])   . Migraine   . Seasonal allergic rhinitis      Today's Vitals   04/11/19 0902  BP: 112/66  Pulse: 79  Resp: 16  Temp: 99 F (37.2 C)  SpO2: 98%  Weight: 222 lb 6.4 oz (100.9 kg)  Height: 5\' 9"  (1.753 m)   Body mass index is 32.84 kg/m.  Review of Systems  Constitutional: Positive for fatigue. Negative for chills and unexpected weight change.  HENT: Positive for congestion, ear pain and rhinorrhea.  Negative for postnasal drip, sneezing and sore throat.   Respiratory: Negative for cough, chest tightness, shortness of breath and wheezing.   Cardiovascular: Negative for chest pain and palpitations.  Gastrointestinal: Negative for abdominal pain, constipation, diarrhea, nausea and vomiting.  Endocrine: Positive for cold intolerance. Negative for heat intolerance, polydipsia and polyuria.  Musculoskeletal: Positive for arthralgias and myalgias. Negative for back pain, joint swelling and neck pain.  Skin: Negative for rash.  Allergic/Immunologic: Positive for environmental allergies.  Neurological: Positive for dizziness. Negative for tremors, numbness and headaches.  Hematological:  Negative for adenopathy. Does not bruise/bleed easily.  Psychiatric/Behavioral: Negative for behavioral problems (Depression), sleep disturbance and suicidal ideas. The patient is not nervous/anxious.     Physical Exam Vitals signs and nursing note reviewed.  Constitutional:      General: She is not in acute distress.    Appearance: Normal appearance. She is well-developed. She is not diaphoretic.  HENT:     Head: Normocephalic and atraumatic.     Right Ear: Tenderness present. Tympanic membrane is erythematous and bulging.     Left Ear: Tenderness present. Tympanic membrane is erythematous and bulging.     Mouth/Throat:     Pharynx: No oropharyngeal exudate.  Eyes:     Pupils: Pupils are equal, round, and reactive to light.  Neck:     Musculoskeletal: Normal range of motion and neck supple.     Thyroid: No thyromegaly.     Vascular: No carotid bruit or JVD.     Trachea: No tracheal deviation.  Cardiovascular:     Rate and Rhythm: Normal rate and regular rhythm.     Heart sounds: Normal heart sounds. No murmur. No friction rub. No gallop.   Pulmonary:     Effort: Pulmonary effort is normal. No respiratory distress.     Breath sounds: Normal breath sounds. No wheezing or rales.  Chest:     Chest wall: No tenderness.  Abdominal:     General: Bowel sounds are normal.     Palpations: Abdomen is soft.     Tenderness: There is no abdominal tenderness.  Musculoskeletal: Normal range of motion.  Lymphadenopathy:     Cervical: No cervical adenopathy.  Skin:    General: Skin is warm and dry.  Neurological:     Mental Status: She is alert and oriented to person, place, and time.     Cranial Nerves: No cranial nerve deficit.  Psychiatric:        Behavior: Behavior normal.        Thought Content: Thought content normal.        Judgment: Judgment normal.    Assessment/Plan: 1. Acute non-recurrent pansinusitis Start augmentin 875mg  bid for 10 days. Rest and increase fluids. Use OTC  medication to improve acute symptoms. - amoxicillin-clavulanate (AUGMENTIN) 875-125 MG tablet; Take 1 tablet by mouth 2 (two) times daily.  Dispense: 20 tablet; Refill: 0  2. Vitamin D deficiency Vitamin d once weekly for next 6 months. Longer as needed.  - ergocalciferol (DRISDOL) 1.25 MG (50000 UT) capsule; Take 1 capsule (50,000 Units total) by mouth once a week.  Dispense: 4 capsule; Refill: 5  3. Fatigue, unspecified type Low/normal B12 levels. Recommend OTC vitamin b12 supplement every day.   4. Essential hypertension Stable  General Counseling: Sharissa verbalizes understanding of the findings of todays visit and agrees with plan of treatment. I have discussed any further diagnostic evaluation that may be needed or ordered today. We also reviewed her medications today. she has  been encouraged to call the office with any questions or concerns that should arise related to todays visit.    Counseling:  Rest and increase fluids. Continue using OTC medication to control symptoms.   This patient was seen by [place] with Dr Lavera Guise as a part of collaborative care agreement  Meds ordered this encounter  Medications  . amoxicillin-clavulanate (AUGMENTIN) 875-125 MG tablet    Sig: Take 1 tablet by mouth 2 (two) times daily.    Dispense:  20 tablet    Refill:  0    Order Specific Question:   Supervising Provider    Answer:   Lavera Guise [[id number]]  . ergocalciferol (DRISDOL) 1.25 MG (50000 UT) capsule    Sig: Take 1 capsule (50,000 Units total) by mouth once a week.    Dispense:  4 capsule    Refill:  5    Order Specific Question:   Supervising Provider    Answer:   Lavera Guise [[id number]]    Time spent: 25 Minutes

## 2019-04-27 ENCOUNTER — Encounter: Payer: Self-pay | Admitting: Nurse Practitioner

## 2019-04-28 NOTE — Telephone Encounter (Signed)
That is good. Beth, can you schedule her for visit? thanks

## 2019-04-28 NOTE — Telephone Encounter (Signed)
Her labs need to be discussed, we can have her do a follow up for paper work and lab results

## 2019-04-29 NOTE — Telephone Encounter (Signed)
Please make app

## 2019-05-02 ENCOUNTER — Telehealth: Payer: Self-pay

## 2019-05-02 NOTE — Telephone Encounter (Signed)
Left message and advised pt her paperwork is ready for pick up and made a copy for scan. Beth

## 2019-05-05 ENCOUNTER — Other Ambulatory Visit: Payer: Self-pay

## 2019-05-05 DIAGNOSIS — I10 Essential (primary) hypertension: Secondary | ICD-10-CM

## 2019-05-05 MED ORDER — HYDROCHLOROTHIAZIDE 25 MG PO TABS: 25.0000 mg | ORAL_TABLET | Freq: Every day | ORAL | 2 refills | Status: DC

## 2019-05-17 ENCOUNTER — Encounter: Payer: Self-pay | Admitting: Nurse Practitioner

## 2019-05-24 ENCOUNTER — Other Ambulatory Visit: Payer: Self-pay | Admitting: Nurse Practitioner

## 2019-05-24 MED ORDER — LOSARTAN POTASSIUM 50 MG PO TABS: 50.0000 mg | ORAL_TABLET | Freq: Every day | ORAL | 5 refills | Status: DC

## 2019-08-01 ENCOUNTER — Other Ambulatory Visit: Payer: Self-pay

## 2019-08-01 DIAGNOSIS — I10 Essential (primary) hypertension: Secondary | ICD-10-CM

## 2019-08-01 MED ORDER — HYDROCHLOROTHIAZIDE 25 MG PO TABS: 25.0000 mg | ORAL_TABLET | Freq: Every day | ORAL | 2 refills | Status: DC

## 2019-08-11 ENCOUNTER — Ambulatory Visit: Payer: BC Managed Care – PPO | Admitting: Nurse Practitioner

## 2019-08-24 ENCOUNTER — Telehealth: Payer: Self-pay

## 2019-08-24 NOTE — Telephone Encounter (Signed)
LEFT MESSAGE FOR PATIENT TO CONFIRM AND GO OVER SCREENING QUESTIONS FOR 08-26-19 APPOINTMENT.

## 2019-08-26 ENCOUNTER — Ambulatory Visit: Payer: BC Managed Care – PPO | Admitting: Nurse Practitioner

## 2019-08-26 ENCOUNTER — Other Ambulatory Visit: Payer: Self-pay

## 2019-08-26 ENCOUNTER — Encounter: Payer: Self-pay | Admitting: Nurse Practitioner

## 2019-08-26 VITALS — BP 130/83 | HR 80 | Temp 98.3°F | Resp 16 | Ht 69.0 in | Wt 237.6 lb

## 2019-08-26 DIAGNOSIS — N390 Urinary tract infection, site not specified: Secondary | ICD-10-CM | POA: Diagnosis not present

## 2019-08-26 DIAGNOSIS — R300 Dysuria: Secondary | ICD-10-CM

## 2019-08-26 DIAGNOSIS — I10 Essential (primary) hypertension: Secondary | ICD-10-CM

## 2019-08-26 DIAGNOSIS — F411 Generalized anxiety disorder: Secondary | ICD-10-CM | POA: Diagnosis not present

## 2019-08-26 MED ORDER — SULFAMETHOXAZOLE-TRIMETHOPRIM 800-160 MG PO TABS: 1.0000 | ORAL_TABLET | Freq: Two times a day (BID) | ORAL | 0 refills | Status: DC

## 2019-08-26 NOTE — Progress Notes (Signed)
[facility] [place], [place] [address]  Internal MEDICINE  Office Visit Note  Patient Name: Linda Vargas  [id number]  [id number]  Date of Service: 09/10/2019  Chief Complaint  Patient presents with  . Hypertension  . Urinary Tract Infection    frequent urination, sometimes very little, all night using the bathroom, strong smell to urine  . Urticaria    does not believe its food, itchy, allergy doctor gave prednisone, went away and came back, unsure if it is lupus related as well and would like it checked to see if rheumatologist needs to see it.   . Injections    would like a flu vaccine but since she is breaking out in hives i suggested that we dont do it today, but i wanted to get your opinion     The patient is here for routine follow up. Today, she states that she has been having mild dysuria and frequency of urination. She states that these symptoms have been going on for a few days and gradually getting worse. Her blood pressure is well controlled and her anxiety/depression is doing well with current medications.       Current Medication: Outpatient Encounter Medications as of 08/26/2019  Medication Sig  . acetaminophen (TYLENOL) 500 MG tablet Take 1,000 mg by mouth every 8 (eight) hours as needed for headache.   . ALPRAZolam (XANAX) 0.25 MG tablet Take 1 tablet (0.25 mg total) by mouth 2 (two) times daily as needed for anxiety.  . Azelastine HCl 0.15 % SOLN INL 2 PFS IEN QD  . cetirizine (ZYRTEC) 10 MG tablet Take 10 mg by mouth daily.   Marland Kitchen EPIPEN 2-PAK 0.3 MG/0.3ML SOAJ injection INJECT INTRAMUSCULARLY AS DIRECTED FOR ANAPHYLATIC REACTIONS  . ergocalciferol (DRISDOL) 1.25 MG (50000 UT) capsule Take 1 capsule (50,000 Units total) by mouth once a week.  . fexofenadine (ALLEGRA ALLERGY) 180 MG tablet Take 180 mg by mouth daily.  Marland Kitchen FLUoxetine (PROZAC) 10 MG tablet Take 1 tablet (10 mg total) by mouth daily.  . hydrochlorothiazide (HYDRODIURIL)  25 MG tablet Take 1 tablet (25 mg total) by mouth daily.  . hydroxychloroquine (PLAQUENIL) 200 MG tablet Take 200 mg by mouth 2 (two) times daily.    Marland Kitchen losartan (COZAAR) 50 MG tablet Take 1 tablet (50 mg total) by mouth daily.  . mometasone (ELOCON) 0.1 % lotion APP TO ITCHY EARS BID FOR 12 DAYS. MAY REPEAT PRF ITCHY EARS.  Marland Kitchen ondansetron (ZOFRAN ODT) 4 MG disintegrating tablet Take 1 tablet (4 mg total) by mouth every 8 (eight) hours as needed for nausea or vomiting.  . traMADol (ULTRAM) 50 MG tablet   . [DISCONTINUED] chlorpheniramine-HYDROcodone (TUSSIONEX PENNKINETIC ER) 10-8 MG/5ML SUER Take 5 mLs by mouth every 12 (twelve) hours as needed for cough.  . sulfamethoxazole-trimethoprim (BACTRIM DS) 800-160 MG tablet Take 1 tablet by mouth 2 (two) times daily.  . [DISCONTINUED] amoxicillin-clavulanate (AUGMENTIN) 875-125 MG tablet Take 1 tablet by mouth 2 (two) times daily. (Patient not taking: Reported on 08/26/2019)  . [DISCONTINUED] levofloxacin (LEVAQUIN) 500 MG tablet Take 1 tablet (500 mg total) by mouth daily. (Patient not taking: Reported on 08/26/2019)   No facility-administered encounter medications on file as of 08/26/2019.     Surgical History: Past Surgical History:  Procedure Laterality Date  . CHOLECYSTECTOMY  2012  . DILATION AND CURETTAGE OF UTERUS  2011  . DILATION AND CURETTAGE OF UTERUS  01/17/2012   Procedure: DILATATION AND CURETTAGE;  Surgeon: Marylynn Pearson,  MD;  Location: [facility];  Service: Gynecology;  Laterality: N/A;  Dilatation and curratage, insertion of Bakri Balloon  . FOOT SURGERY    . HARDWARE REMOVAL Right 02/18/2018   Procedure: HARDWARE REMOVAL RIGHT WRIST;  Surgeon: Hessie Knows, MD;  Location: [facility];  Service: Orthopedics;  Laterality: Right;  . OPEN REDUCTION INTERNAL FIXATION (ORIF) DISTAL RADIAL FRACTURE Right 11/26/2017   Procedure: OPEN REDUCTION INTERNAL FIXATION (ORIF) DISTAL RADIAL FRACTURE;  Surgeon: Hessie Knows, MD;  Location: [facility];   Service: Orthopedics;  Laterality: Right;  . TONSILLECTOMY    . WRIST SURGERY Right 02/18/2018    Medical History: Past Medical History:  Diagnosis Date  . Anxiety   . Connective tissue disorder (HCC)    undifferentiated  . Fibromyalgia   . Hypertension   . Lupus ([place])   . Migraine   . Seasonal allergic rhinitis     Family History: Family History  Problem Relation Age of Onset  . Hypertension Mother   . Hypertension Father   . Diabetes Father   . Heart disease Maternal Grandmother   . Heart disease Paternal Grandmother   . Anesthesia problems Neg Hx     Social History   Socioeconomic History  . Marital status: Single    Spouse name: Not on file  . Number of children: Not on file  . Years of education: Not on file  . Highest education level: Not on file  Occupational History  . Not on file  Social Needs  . Financial resource strain: Not on file  . Food insecurity    Worry: Not on file    Inability: Not on file  . Transportation needs    Medical: Not on file    Non-medical: Not on file  Tobacco Use  . Smoking status: Never Smoker  . Smokeless tobacco: Never Used  Substance and Sexual Activity  . Alcohol use: No  . Drug use: No  . Sexual activity: Not on file  Lifestyle  . Physical activity    Days per week: Not on file    Minutes per session: Not on file  . Stress: Not on file  Relationships  . Social Herbalist on phone: Not on file    Gets together: Not on file    Attends religious service: Not on file    Active member of club or organization: Not on file    Attends meetings of clubs or organizations: Not on file    Relationship status: Not on file  . Intimate partner violence    Fear of current or ex partner: Not on file    Emotionally abused: Not on file    Physically abused: Not on file    Forced sexual activity: Not on file  Other Topics Concern  . Not on file  Social History Narrative   Lives at home.      Review of  Systems  Constitutional: Positive for fatigue. Negative for chills and unexpected weight change.  HENT: Negative for congestion, postnasal drip, rhinorrhea, sneezing and sore throat.   Respiratory: Negative for cough, chest tightness, shortness of breath and wheezing.   Cardiovascular: Negative for chest pain and palpitations.  Gastrointestinal: Negative for abdominal pain, constipation, diarrhea, nausea and vomiting.  Endocrine: Negative for cold intolerance, heat intolerance, polydipsia and polyuria.  Genitourinary: Positive for dysuria, flank pain, frequency and urgency.  Musculoskeletal: Positive for back pain. Negative for arthralgias, joint swelling and neck pain.  Skin: Negative for rash.  Allergic/Immunologic:  Negative for environmental allergies.  Neurological: Negative for dizziness, tremors, numbness and headaches.  Hematological: Negative for adenopathy. Does not bruise/bleed easily.  Psychiatric/Behavioral: Positive for dysphoric mood. Negative for behavioral problems (Depression), sleep disturbance and suicidal ideas. The patient is nervous/anxious.    Today's Vitals   08/26/19 1557  BP: 130/83  Pulse: 80  Resp: 16  Temp: 98.3 F (36.8 C)  SpO2: 97%  Weight: 237 lb 9.6 oz (107.8 kg)  Height: 5\' 9"  (1.753 m)   Body mass index is 35.09 kg/m.  Physical Exam Vitals signs and nursing note reviewed.  Constitutional:      General: She is not in acute distress.    Appearance: Normal appearance. She is well-developed. She is not diaphoretic.  HENT:     Head: Normocephalic and atraumatic.     Nose: Nose normal.     Mouth/Throat:     Pharynx: No oropharyngeal exudate.  Eyes:     Extraocular Movements: Extraocular movements intact.     Pupils: Pupils are equal, round, and reactive to light.  Neck:     Musculoskeletal: Normal range of motion and neck supple.     Thyroid: No thyromegaly.     Vascular: No JVD.     Trachea: No tracheal deviation.  Cardiovascular:      Rate and Rhythm: Normal rate and regular rhythm.     Heart sounds: Normal heart sounds. No murmur. No friction rub. No gallop.   Pulmonary:     Effort: Pulmonary effort is normal. No respiratory distress.     Breath sounds: Normal breath sounds. No wheezing or rales.  Chest:     Chest wall: No tenderness.  Abdominal:     General: Bowel sounds are normal.     Palpations: Abdomen is soft.     Tenderness: There is no abdominal tenderness.  Genitourinary:    Comments: Urine sample sent to lab for culture and sensitivity.  Musculoskeletal: Normal range of motion.  Lymphadenopathy:     Cervical: No cervical adenopathy.  Skin:    General: Skin is warm and dry.  Neurological:     Mental Status: She is alert and oriented to person, place, and time.     Cranial Nerves: No cranial nerve deficit.  Psychiatric:        Mood and Affect: Mood normal.        Behavior: Behavior normal.        Thought Content: Thought content normal.        Judgment: Judgment normal.    Assessment/Plan: 1. Urinary tract infection without hematuria, site unspecified Start bactrim ds bid for 10 days. Urine sent for culture and sensitivity. Adjust antibiotics as indicated.  - sulfamethoxazole-trimethoprim (BACTRIM DS) 800-160 MG tablet; Take 1 tablet by mouth 2 (two) times daily.  Dispense: 20 tablet; Refill: 0  2. Essential hypertension Stable. Continue bp medication as prescribed   3. Generalized anxiety disorder Stable. Continue prozac as prescribed. May take alprazolam as needed and as prescribed for acute anxiety.   4. Dysuria - CULTURE, URINE COMPREHENSIVE  General Counseling: Kade verbalizes understanding of the findings of todays visit and agrees with plan of treatment. I have discussed any further diagnostic evaluation that may be needed or ordered today. We also reviewed her medications today. she has been encouraged to call the office with any questions or concerns that should arise related to  todays visit.  This patient was seen by Leretha Pol FNP Collaboration with Dr Lavera Guise as a part  of collaborative care agreement  Orders Placed This Encounter  Procedures  . CULTURE, URINE COMPREHENSIVE    Meds ordered this encounter  Medications  . sulfamethoxazole-trimethoprim (BACTRIM DS) 800-160 MG tablet    Sig: Take 1 tablet by mouth 2 (two) times daily.    Dispense:  20 tablet    Refill:  0    Order Specific Question:   Supervising Provider    Answer:   Lavera Guise [id number]    Time spent: 49 Minutes      Dr Lavera Guise Internal medicine

## 2019-08-31 LAB — CULTURE, URINE COMPREHENSIVE

## 2019-08-31 NOTE — Progress Notes (Signed)
Patient started on bactrim at time of visit.

## 2019-09-10 DIAGNOSIS — R319 Hematuria, unspecified: Secondary | ICD-10-CM | POA: Insufficient documentation

## 2019-09-10 DIAGNOSIS — R300 Dysuria: Secondary | ICD-10-CM | POA: Insufficient documentation

## 2019-09-10 DIAGNOSIS — N390 Urinary tract infection, site not specified: Secondary | ICD-10-CM | POA: Insufficient documentation

## 2019-09-14 ENCOUNTER — Telehealth: Payer: Self-pay

## 2019-09-14 NOTE — Telephone Encounter (Signed)
Patient called saying that she hit her head hard on a wooden bed. At first she had a little dizziness and when she called she only had a headache, no bump, nausea, dizziness, or vomiting. Patient wanted to know if she needed to be seen.  Spoke with DFK and advised patient that she ice where she hit her head and to take tylenol for headaches. And if she feels dizzy, off balance, nausea, or vomiting to go to ED.

## 2019-09-24 IMAGING — MG DIGITAL DIAGNOSTIC UNILATERAL LEFT MAMMOGRAM WITH TOMO AND CAD
6 series · 6 of 18 positions shown · non-contrast
Comparison: Previous exam(s).

CLINICAL DATA: 42-year-old female recalled from baseline screening
mammogram dated 12/13/2018 for a possible left breast asymmetry.

EXAM:
DIGITAL DIAGNOSTIC LEFT MAMMOGRAM WITH CAD AND TOMO
ULTRASOUND LEFT BREAST

[L CC synth-2D (1 of 2)]
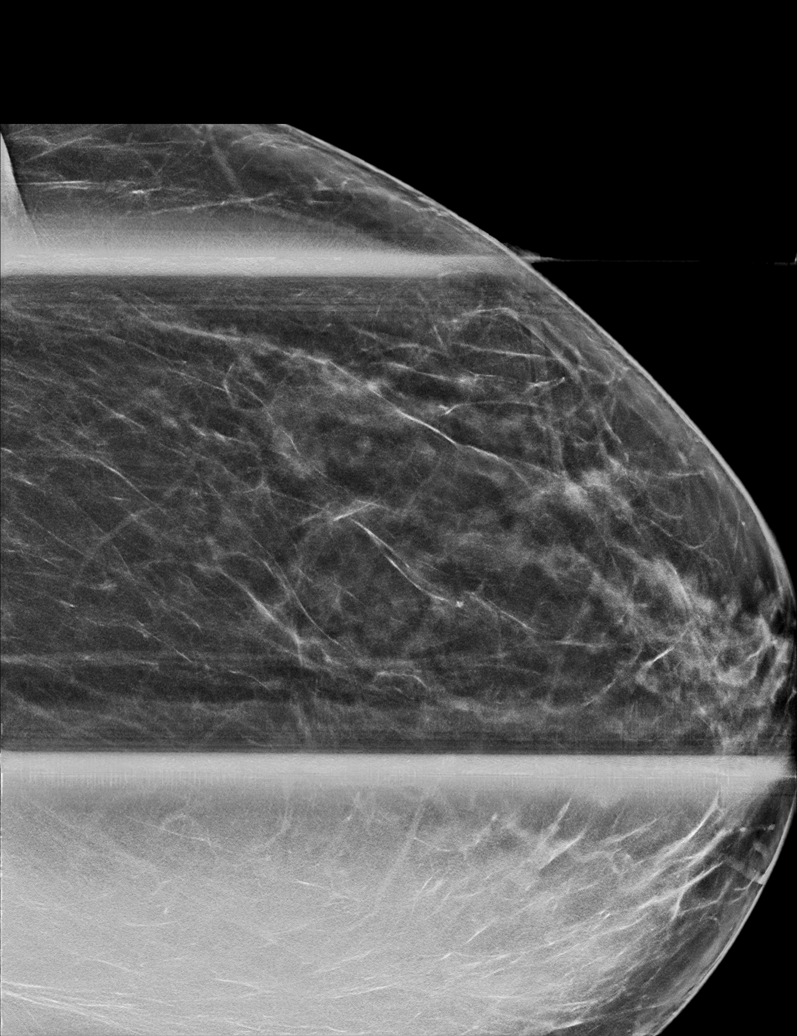

[L MLO synth-2D]
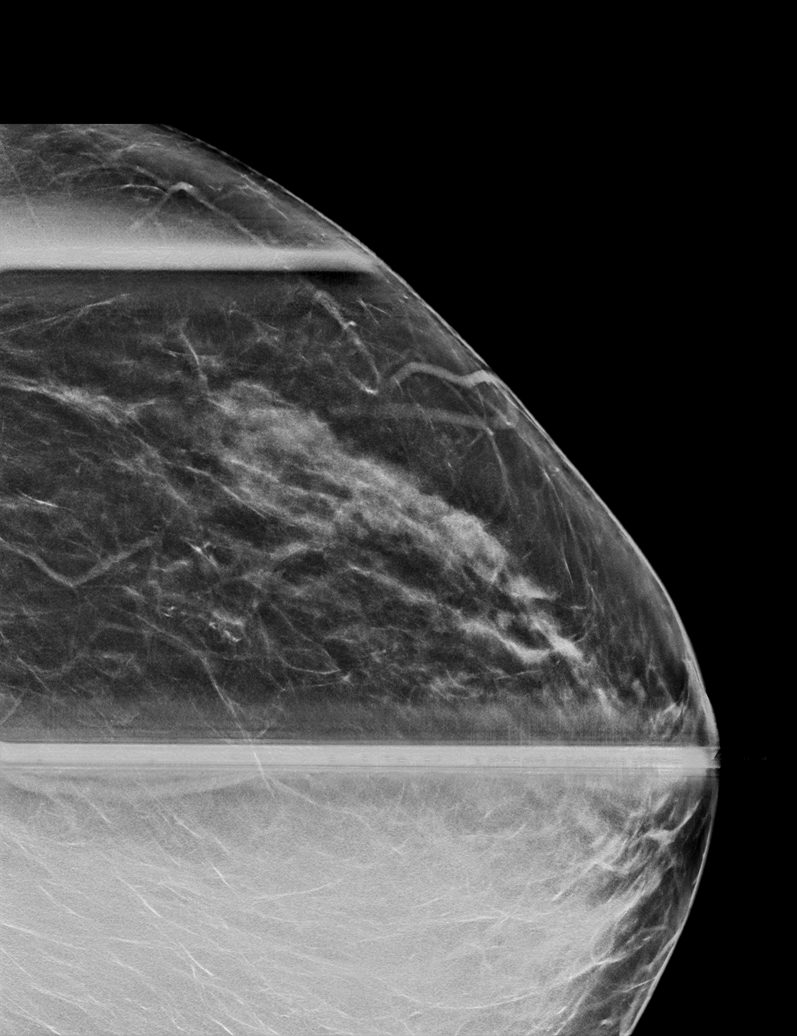

[L CC synth-2D (2 of 2)]
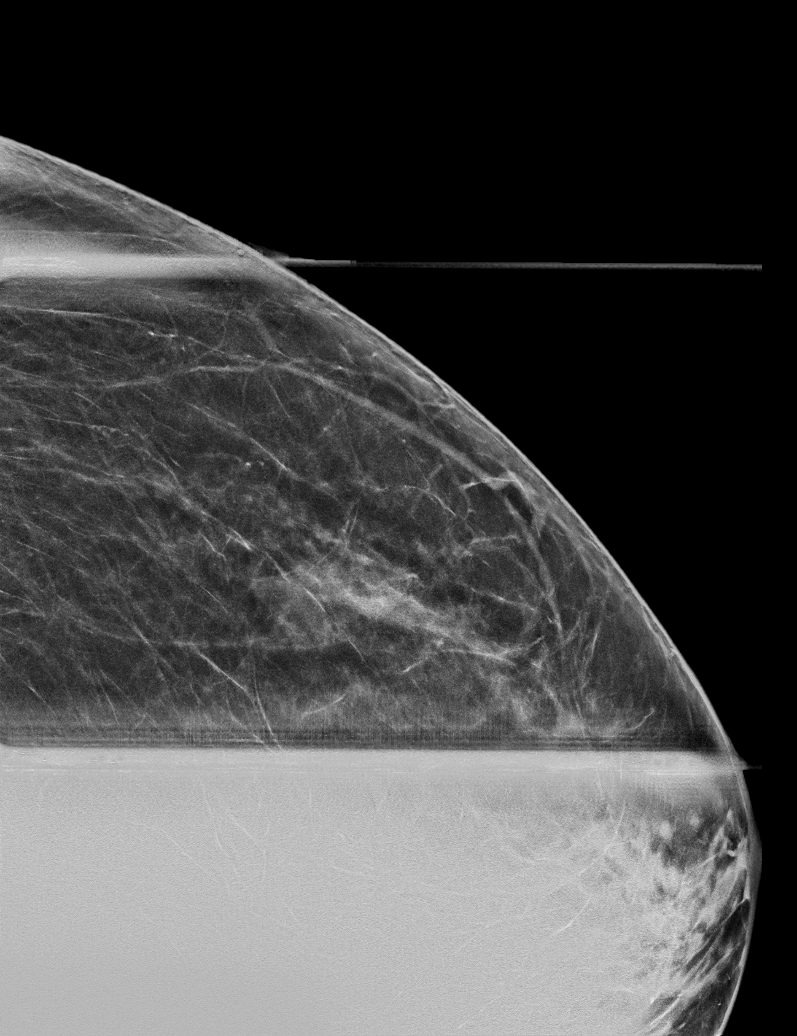

[L CC tomo (1 of 2) · tomo slice 37/72.0]
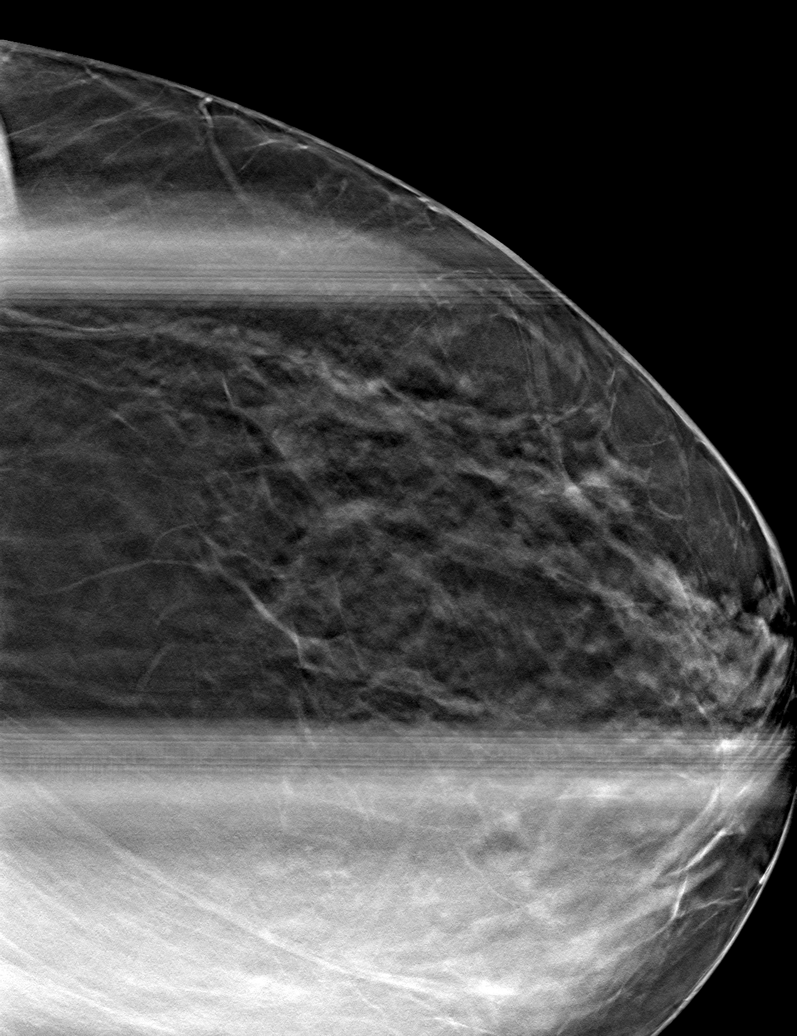

[L CC tomo (2 of 2) · tomo slice 32/63.0]
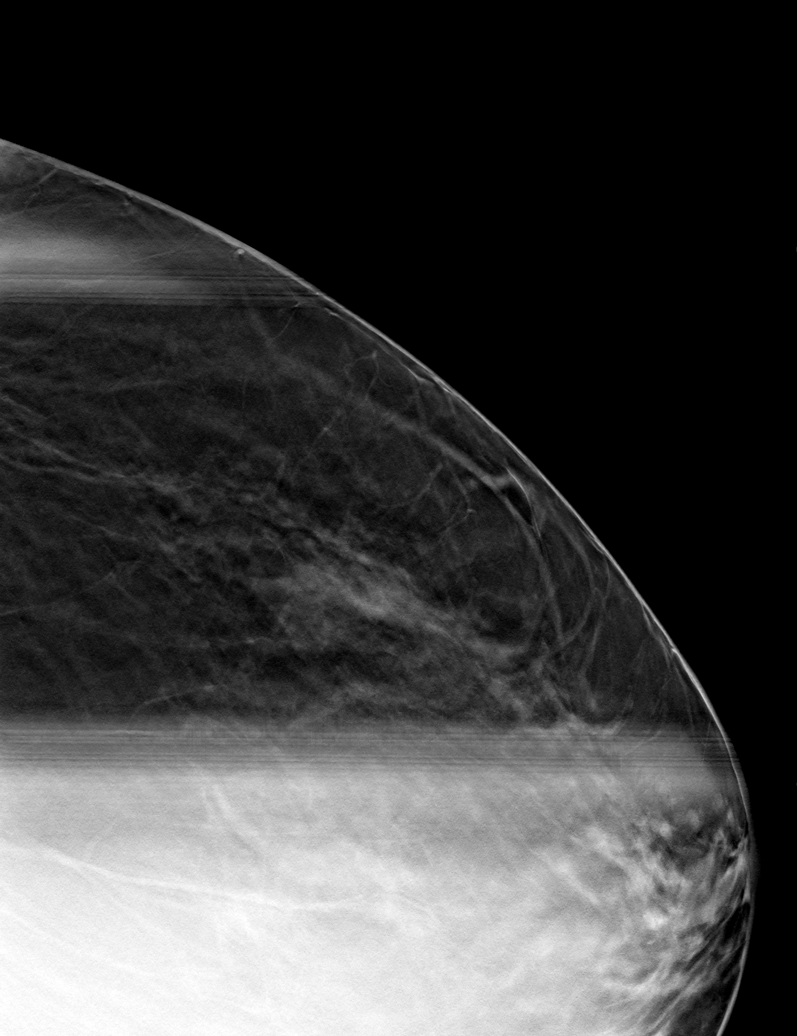

[L MLO tomo · tomo slice 35/68.0]
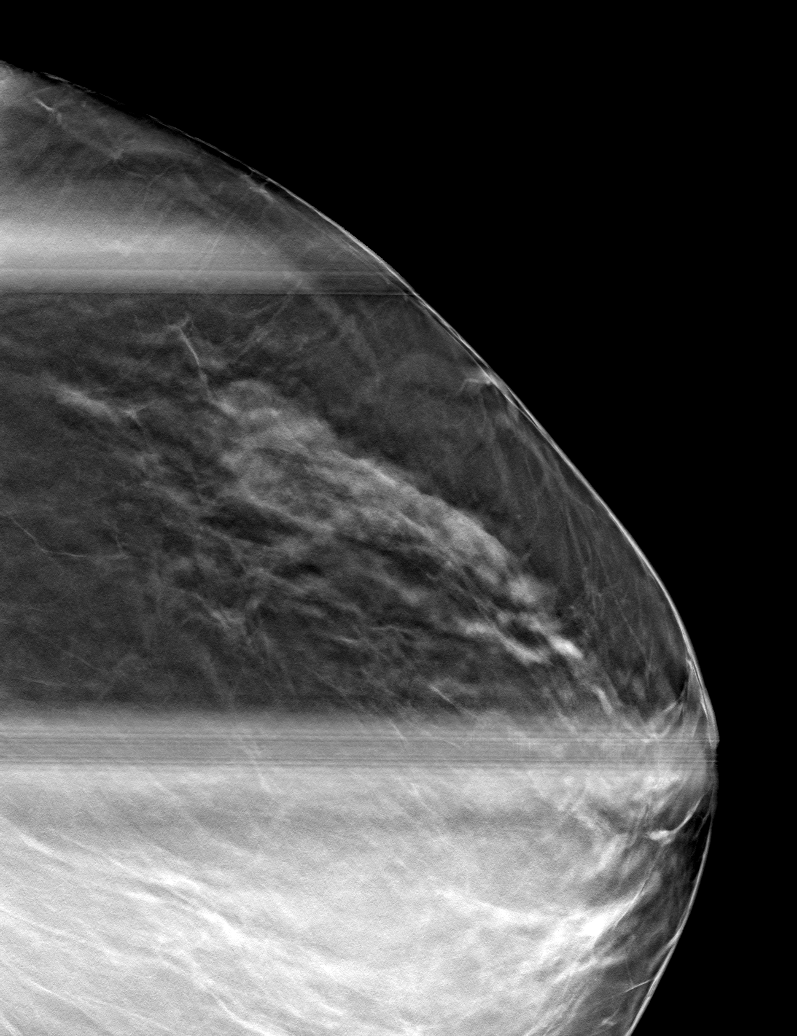

[6 of 18 positions shown; findings below may reference images not displayed]

ACR Breast Density Category b: There are scattered areas of
fibroglandular density.
FINDINGS: Focal asymmetric tissue is again identified in the upper outer
quadrant of the left breast. This effaces in to the appearance of
normal fibroglandular tissue. Precautionary ultrasound was
performed.

Mammographic images were processed with CAD.

Targeted ultrasound is performed, showing focally dense
fibroglandular tissue without focal or suspicious sonographic
abnormality. Evaluation of the entire upper outer left breast was
performed.
IMPRESSION: Probably benign left breast focal asymmetry likely representing
asymmetric fibroglandular tissue. Recommendation is for
precautionary short-term follow-up.

RECOMMENDATION:
Diagnostic left breast mammogram with possible ultrasound in 6
months.

I have discussed the findings and recommendations with the patient.
Results were also provided in writing at the conclusion of the
visit. If applicable, a reminder letter will be sent to the patient
regarding the next appointment.

BI-RADS CATEGORY  3: Probably benign.

## 2019-10-20 ENCOUNTER — Encounter: Payer: Self-pay | Admitting: Nurse Practitioner

## 2019-12-17 ENCOUNTER — Ambulatory Visit: Payer: BC Managed Care – PPO | Attending: Internal Medicine

## 2019-12-17 ENCOUNTER — Other Ambulatory Visit: Payer: Self-pay

## 2019-12-17 DIAGNOSIS — Z23 Encounter for immunization: Secondary | ICD-10-CM

## 2019-12-17 NOTE — Progress Notes (Signed)
   Covid-19 Vaccination Clinic  Name:  Linda Vargas    MRN: [id number] DOB: [birth year]  12/17/2019  [name] was observed post Covid-19 immunization for 15 minutes without incident. She was provided with Vaccine Information Sheet and instruction to access the V-Safe system.   [name] was instructed to call 911 with any severe reactions post vaccine: Marland Kitchen Difficulty breathing  . Swelling of face and throat  . A fast heartbeat  . A bad rash all over body  . Dizziness and weakness   Immunizations Administered    Name Date Dose VIS Date Route   Moderna COVID-19 Vaccine 12/17/2019  1:44 PM 0.5 mL 09/13/2019 Intramuscular   Manufacturer: Moderna   Lot: [id number]   [id number]

## 2019-12-21 ENCOUNTER — Other Ambulatory Visit: Payer: Self-pay

## 2019-12-21 DIAGNOSIS — F321 Major depressive disorder, single episode, moderate: Secondary | ICD-10-CM

## 2019-12-21 MED ORDER — FLUOXETINE HCL 10 MG PO TABS: 10.0000 mg | ORAL_TABLET | Freq: Every day | ORAL | 0 refills | Status: DC

## 2019-12-28 ENCOUNTER — Telehealth: Payer: Self-pay

## 2019-12-28 NOTE — Telephone Encounter (Signed)
Lmom to confirm and screen for 12-30-19 ov.

## 2019-12-30 ENCOUNTER — Ambulatory Visit: Payer: BC Managed Care – PPO | Admitting: Nurse Practitioner

## 2020-01-02 ENCOUNTER — Other Ambulatory Visit: Payer: Self-pay | Admitting: Obstetrics and Gynecology

## 2020-01-02 ENCOUNTER — Ambulatory Visit (INDEPENDENT_AMBULATORY_CARE_PROVIDER_SITE_OTHER): Payer: BC Managed Care – PPO | Admitting: Nurse Practitioner

## 2020-01-02 ENCOUNTER — Encounter: Payer: Self-pay | Admitting: Nurse Practitioner

## 2020-01-02 ENCOUNTER — Other Ambulatory Visit: Payer: Self-pay

## 2020-01-02 VITALS — BP 130/75 | HR 79 | Temp 97.4°F | Resp 16 | Ht 69.0 in | Wt 237.2 lb

## 2020-01-02 DIAGNOSIS — N6489 Other specified disorders of breast: Secondary | ICD-10-CM

## 2020-01-02 DIAGNOSIS — F321 Major depressive disorder, single episode, moderate: Secondary | ICD-10-CM | POA: Diagnosis not present

## 2020-01-02 DIAGNOSIS — R5383 Other fatigue: Secondary | ICD-10-CM

## 2020-01-02 DIAGNOSIS — F5101 Primary insomnia: Secondary | ICD-10-CM

## 2020-01-02 DIAGNOSIS — I10 Essential (primary) hypertension: Secondary | ICD-10-CM

## 2020-01-02 MED ORDER — LOSARTAN POTASSIUM 50 MG PO TABS: 50.0000 mg | ORAL_TABLET | Freq: Every day | ORAL | 5 refills | Status: DC

## 2020-01-02 MED ORDER — HYDROCHLOROTHIAZIDE 25 MG PO TABS: 25.0000 mg | ORAL_TABLET | Freq: Every day | ORAL | 5 refills | Status: DC

## 2020-01-02 MED ORDER — FLUOXETINE HCL 10 MG PO TABS: 10.0000 mg | ORAL_TABLET | Freq: Every day | ORAL | 5 refills | Status: DC

## 2020-01-02 MED ORDER — TRAZODONE HCL 50 MG PO TABS: ORAL_TABLET | ORAL | 3 refills | Status: DC

## 2020-01-02 NOTE — Progress Notes (Signed)
[facility] [place], [place] [address]  Internal MEDICINE  Office Visit Note  Patient Name: Linda Vargas  [id number]  [id number]  Date of Service: 01/11/2020  Chief Complaint  Patient presents with  . Hypertension  . Diarrhea    since covid vaccine on 12/17/19  . Nausea    since covid vaccine on 12/17/19    The patient is here for routine follow up. Having some trouble with sleep. States that she is exhausted all the time, but when she lays down to sleep, she finds her thoughts racing and cannot get to sleep at all. Does take Prozac to help with anxiety and depression. Does see a therapist who also feels as though prozac is doing well at current dose.  Saw rheumatologist recently for Lupus. Blood work encouraging.  She had her first dose Moderna COVID 19 vaccine 12/17/2019. She has had some nausea and diarrhea off and on since then. Symptoms are manageable.       Current Medication: Outpatient Encounter Medications as of 01/02/2020  Medication Sig  . acetaminophen (TYLENOL) 500 MG tablet Take 1,000 mg by mouth every 8 (eight) hours as needed for headache.   . Azelastine HCl 0.15 % SOLN INL 2 PFS IEN QD  . EPIPEN 2-PAK 0.3 MG/0.3ML SOAJ injection INJECT INTRAMUSCULARLY AS DIRECTED FOR ANAPHYLATIC REACTIONS  . ergocalciferol (DRISDOL) 1.25 MG (50000 UT) capsule Take 1 capsule (50,000 Units total) by mouth once a week.  Marland Kitchen FLUoxetine (PROZAC) 10 MG tablet Take 1 tablet (10 mg total) by mouth daily.  . hydrochlorothiazide (HYDRODIURIL) 25 MG tablet Take 1 tablet (25 mg total) by mouth daily.  . hydroxychloroquine (PLAQUENIL) 200 MG tablet Take 200 mg by mouth 2 (two) times daily.    . hydrOXYzine (ATARAX/VISTARIL) 25 MG tablet Take 25 mg by mouth daily.  Marland Kitchen losartan (COZAAR) 50 MG tablet Take 1 tablet (50 mg total) by mouth daily.  . ondansetron (ZOFRAN ODT) 4 MG disintegrating tablet Take 1 tablet (4 mg total) by mouth every 8 (eight) hours as needed for  nausea or vomiting.  . [DISCONTINUED] FLUoxetine (PROZAC) 10 MG tablet Take 1 tablet (10 mg total) by mouth daily.  . [DISCONTINUED] hydrochlorothiazide (HYDRODIURIL) 25 MG tablet Take 1 tablet (25 mg total) by mouth daily.  . [DISCONTINUED] losartan (COZAAR) 50 MG tablet Take 1 tablet (50 mg total) by mouth daily.  . traZODone (DESYREL) 50 MG tablet Take 1/2 to 1 tablet po QHS prn insomnia.  . [DISCONTINUED] ALPRAZolam (XANAX) 0.25 MG tablet Take 1 tablet (0.25 mg total) by mouth 2 (two) times daily as needed for anxiety. (Patient not taking: Reported on 01/02/2020)  . [DISCONTINUED] cetirizine (ZYRTEC) 10 MG tablet Take 10 mg by mouth daily.   . [DISCONTINUED] fexofenadine (ALLEGRA ALLERGY) 180 MG tablet Take 180 mg by mouth daily.  . [DISCONTINUED] mometasone (ELOCON) 0.1 % lotion APP TO ITCHY EARS BID FOR 12 DAYS. MAY REPEAT PRF ITCHY EARS.  . [DISCONTINUED] sulfamethoxazole-trimethoprim (BACTRIM DS) 800-160 MG tablet Take 1 tablet by mouth 2 (two) times daily. (Patient not taking: Reported on 01/02/2020)  . [DISCONTINUED] traMADol (ULTRAM) 50 MG tablet    No facility-administered encounter medications on file as of 01/02/2020.    Surgical History: Past Surgical History:  Procedure Laterality Date  . CHOLECYSTECTOMY  2012  . DILATION AND CURETTAGE OF UTERUS  2011  . DILATION AND CURETTAGE OF UTERUS  01/17/2012   Procedure: DILATATION AND CURETTAGE;  Surgeon: Marylynn Pearson, MD;  Location: [facility]  ORS;  Service: Gynecology;  Laterality: N/A;  Dilatation and curratage, insertion of Bakri Balloon  . FOOT SURGERY    . HARDWARE REMOVAL Right 02/18/2018   Procedure: HARDWARE REMOVAL RIGHT WRIST;  Surgeon: Hessie Knows, MD;  Location: [facility] ORS;  Service: Orthopedics;  Laterality: Right;  . OPEN REDUCTION INTERNAL FIXATION (ORIF) DISTAL RADIAL FRACTURE Right 11/26/2017   Procedure: OPEN REDUCTION INTERNAL FIXATION (ORIF) DISTAL RADIAL FRACTURE;  Surgeon: Hessie Knows, MD;  Location: [facility] ORS;  Service:  Orthopedics;  Laterality: Right;  . TONSILLECTOMY    . WRIST SURGERY Right 02/18/2018    Medical History: Past Medical History:  Diagnosis Date  . Anxiety   . Connective tissue disorder (HCC)    undifferentiated  . Fibromyalgia   . Hypertension   . Lupus ([place])   . Migraine   . Seasonal allergic rhinitis     Family History: Family History  Problem Relation Age of Onset  . Hypertension Mother   . Hypertension Father   . Diabetes Father   . Heart disease Maternal Grandmother   . Heart disease Paternal Grandmother   . Anesthesia problems Neg Hx     Social History   Socioeconomic History  . Marital status: Single    Spouse name: Not on file  . Number of children: Not on file  . Years of education: Not on file  . Highest education level: Not on file  Occupational History  . Not on file  Tobacco Use  . Smoking status: Never Smoker  . Smokeless tobacco: Never Used  Substance and Sexual Activity  . Alcohol use: No  . Drug use: No  . Sexual activity: Not on file  Other Topics Concern  . Not on file  Social History Narrative   Lives at home.   Social Determinants of Health   Financial Resource Strain:   . Difficulty of Paying Living Expenses:   Food Insecurity:   . Worried About Charity fundraiser in the Last Year:   . Arboriculturist in the Last Year:   Transportation Needs:   . Film/video editor (Medical):   Marland Kitchen Lack of Transportation (Non-Medical):   Physical Activity:   . Days of Exercise per Week:   . Minutes of Exercise per Session:   Stress:   . Feeling of Stress :   Social Connections:   . Frequency of Communication with Friends and Family:   . Frequency of Social Gatherings with Friends and Family:   . Attends Religious Services:   . Active Member of Clubs or Organizations:   . Attends Archivist Meetings:   Marland Kitchen Marital Status:   Intimate Partner Violence:   . Fear of Current or Ex-Partner:   . Emotionally Abused:   Marland Kitchen Physically  Abused:   . Sexually Abused:       Review of Systems  Constitutional: Positive for fatigue. Negative for chills and unexpected weight change.  HENT: Negative for congestion, postnasal drip, rhinorrhea, sneezing and sore throat.   Respiratory: Negative for apnea, cough, chest tightness, shortness of breath and wheezing.   Cardiovascular: Negative for chest pain and palpitations.  Gastrointestinal: Negative for abdominal pain, constipation, diarrhea, nausea and vomiting.  Endocrine: Negative for cold intolerance, heat intolerance, polydipsia and polyuria.  Genitourinary: Negative for dysuria, flank pain, frequency and urgency.  Musculoskeletal: Negative for arthralgias, back pain, joint swelling and neck pain.  Skin: Negative for rash.  Allergic/Immunologic: Positive for environmental allergies.  Neurological: Negative for dizziness, tremors, numbness  and headaches.  Hematological: Negative for adenopathy. Does not bruise/bleed easily.  Psychiatric/Behavioral: Positive for dysphoric mood and sleep disturbance. Negative for behavioral problems (Depression) and suicidal ideas. The patient is nervous/anxious.     Today's Vitals   01/02/20 1610  BP: 130/75  Pulse: 79  Resp: 16  Temp: (!) 97.4 F (36.3 C)  SpO2: 97%  Weight: 237 lb 3.2 oz (107.6 kg)  Height: 5\' 9"  (1.753 m)   Body mass index is 35.03 kg/m.  Physical Exam Vitals and nursing note reviewed.  Constitutional:      General: She is not in acute distress.    Appearance: Normal appearance. She is well-developed. She is not diaphoretic.  HENT:     Head: Normocephalic and atraumatic.     Mouth/Throat:     Pharynx: No oropharyngeal exudate.  Eyes:     Pupils: Pupils are equal, round, and reactive to light.  Neck:     Thyroid: No thyromegaly.     Vascular: No carotid bruit or JVD.     Trachea: No tracheal deviation.  Cardiovascular:     Rate and Rhythm: Normal rate and regular rhythm.     Heart sounds: Normal heart  sounds. No murmur. No friction rub. No gallop.   Pulmonary:     Effort: Pulmonary effort is normal. No respiratory distress.     Breath sounds: Normal breath sounds. No wheezing or rales.  Chest:     Chest wall: No tenderness.  Abdominal:     General: Bowel sounds are normal.     Palpations: Abdomen is soft.     Tenderness: There is no abdominal tenderness.  Musculoskeletal:        General: Normal range of motion.     Cervical back: Normal range of motion and neck supple.  Lymphadenopathy:     Cervical: No cervical adenopathy.  Skin:    General: Skin is warm and dry.  Neurological:     Mental Status: She is alert and oriented to person, place, and time.     Cranial Nerves: No cranial nerve deficit.  Psychiatric:        Behavior: Behavior normal.        Thought Content: Thought content normal.        Judgment: Judgment normal.    Assessment/Plan: 1. Essential hypertension Stable. conitnue HCTZ and losartan as prescribed  - hydrochlorothiazide (HYDRODIURIL) 25 MG tablet; Take 1 tablet (25 mg total) by mouth daily.  Dispense: 30 tablet; Refill: 5  2. Primary insomnia Add trazodone 50mg . Advised she take 1/2 to 1 tablet at bedtime as needed for insomnia.  - traZODone (DESYREL) 50 MG tablet; Take 1/2 to 1 tablet po QHS prn insomnia.  Dispense: 30 tablet; Refill: 3  3. Fatigue, unspecified type Likely related to difficulty sleeping. Will monitor.   4. Depression, major, single episode, moderate (HCC) Stable. Continue prozac as prescribed. Continue with visits with therapist as scheduled.  - FLUoxetine (PROZAC) 10 MG tablet; Take 1 tablet (10 mg total) by mouth daily.  Dispense: 30 tablet; Refill: 5  General Counseling: Linda Vargas verbalizes understanding of the findings of todays visit and agrees with plan of treatment. I have discussed any further diagnostic evaluation that may be needed or ordered today. We also reviewed her medications today. she has been encouraged to call the  office with any questions or concerns that should arise related to todays visit.  This patient was seen by Leretha Pol FNP Collaboration with Dr Lavera Guise as a  part of collaborative care agreement  Meds ordered this encounter  Medications  . traZODone (DESYREL) 50 MG tablet    Sig: Take 1/2 to 1 tablet po QHS prn insomnia.    Dispense:  30 tablet    Refill:  3    Order Specific Question:   Supervising Provider    Answer:   Lavera Guise [id number]  . FLUoxetine (PROZAC) 10 MG tablet    Sig: Take 1 tablet (10 mg total) by mouth daily.    Dispense:  30 tablet    Refill:  5    Please add refills to prescription sent in 11/29/2018    Order Specific Question:   Supervising Provider    Answer:   Lavera Guise [[id number]]  . hydrochlorothiazide (HYDRODIURIL) 25 MG tablet    Sig: Take 1 tablet (25 mg total) by mouth daily.    Dispense:  30 tablet    Refill:  5    Order Specific Question:   Supervising Provider    Answer:   Lavera Guise [id number]  . losartan (COZAAR) 50 MG tablet    Sig: Take 1 tablet (50 mg total) by mouth daily.    Dispense:  30 tablet    Refill:  5    Order Specific Question:   Supervising Provider    Answer:   Lavera Guise [id number]    Total time spent: 30 Minutes   Time spent includes review of chart, medications, test results, and follow up plan with the patient.      Dr Lavera Guise Internal medicine

## 2020-01-11 DIAGNOSIS — F5101 Primary insomnia: Secondary | ICD-10-CM | POA: Insufficient documentation

## 2020-01-14 ENCOUNTER — Ambulatory Visit: Payer: BC Managed Care – PPO | Attending: Internal Medicine

## 2020-01-14 DIAGNOSIS — Z23 Encounter for immunization: Secondary | ICD-10-CM

## 2020-01-14 NOTE — Progress Notes (Signed)
   Covid-19 Vaccination Clinic  Name:  Linda Vargas    MRN: [id number] DOB: 1976/04/28  01/14/2020  [name] was observed post Covid-19 immunization for 15 minutes without incident. She was provided with Vaccine Information Sheet and instruction to access the V-Safe system.   [name] was instructed to call 911 with any severe reactions post vaccine: Marland Kitchen Difficulty breathing  . Swelling of face and throat  . A fast heartbeat  . A bad rash all over body  . Dizziness and weakness   Immunizations Administered    Name Date Dose VIS Date Route   Moderna COVID-19 Vaccine 01/14/2020  8:24 AM 0.5 mL 09/13/2019 Intramuscular   Manufacturer: Levan Hurst   [id number]   [id number]

## 2020-01-20 ENCOUNTER — Other Ambulatory Visit: Payer: BC Managed Care – PPO

## 2020-02-27 ENCOUNTER — Ambulatory Visit
Admission: RE | Admit: 2020-02-27 | Discharge: 2020-02-27 | Disposition: A | Discharge status: Home / Self Care | Payer: BC Managed Care – PPO | Source: Ambulatory Visit | Attending: Obstetrics and Gynecology | Admitting: Obstetrics and Gynecology

## 2020-02-27 ENCOUNTER — Other Ambulatory Visit: Payer: Self-pay

## 2020-02-27 ENCOUNTER — Ambulatory Visit: Payer: BC Managed Care – PPO

## 2020-02-27 DIAGNOSIS — N6489 Other specified disorders of breast: Secondary | ICD-10-CM

## 2020-04-03 ENCOUNTER — Telehealth: Payer: Self-pay

## 2020-04-03 NOTE — Telephone Encounter (Signed)
Lmom to confirm and screen for 04-05-20 ov.

## 2020-04-05 ENCOUNTER — Other Ambulatory Visit: Payer: Self-pay

## 2020-04-05 ENCOUNTER — Encounter: Payer: Self-pay | Admitting: Nurse Practitioner

## 2020-04-05 ENCOUNTER — Ambulatory Visit (INDEPENDENT_AMBULATORY_CARE_PROVIDER_SITE_OTHER): Payer: BC Managed Care – PPO | Admitting: Nurse Practitioner

## 2020-04-05 VITALS — BP 113/75 | HR 80 | Temp 97.2°F | Resp 16 | Ht 69.0 in | Wt 228.8 lb

## 2020-04-05 DIAGNOSIS — H60503 Unspecified acute noninfective otitis externa, bilateral: Secondary | ICD-10-CM | POA: Diagnosis not present

## 2020-04-05 DIAGNOSIS — F5101 Primary insomnia: Secondary | ICD-10-CM

## 2020-04-05 DIAGNOSIS — R300 Dysuria: Secondary | ICD-10-CM

## 2020-04-05 DIAGNOSIS — R319 Hematuria, unspecified: Secondary | ICD-10-CM

## 2020-04-05 DIAGNOSIS — N390 Urinary tract infection, site not specified: Secondary | ICD-10-CM

## 2020-04-05 DIAGNOSIS — I10 Essential (primary) hypertension: Secondary | ICD-10-CM | POA: Diagnosis not present

## 2020-04-05 LAB — POCT URINALYSIS DIPSTICK
Glucose, UA: NEGATIVE
Nitrite, UA: NEGATIVE
Protein, UA: POSITIVE — AB
Spec Grav, UA: 1.015 (ref 1.010–1.025)
Urobilinogen, UA: 0.2 E.U./dL
pH, UA: 6 (ref 5.0–8.0)

## 2020-04-05 MED ORDER — NITROFURANTOIN MONOHYD MACRO 100 MG PO CAPS: 100.0000 mg | ORAL_CAPSULE | Freq: Two times a day (BID) | ORAL | 0 refills | Status: DC

## 2020-04-05 MED ORDER — TRAZODONE HCL 50 MG PO TABS: ORAL_TABLET | ORAL | 3 refills | Status: DC

## 2020-04-05 MED ORDER — CIPROFLOXACIN-DEXAMETHASONE 0.3-0.1 % OT SUSP: 4.0000 [drp] | Freq: Two times a day (BID) | OTIC | 0 refills | Status: DC

## 2020-04-05 NOTE — Progress Notes (Signed)
[facility] [place], [place] [address]  Internal MEDICINE  Office Visit Note  Patient Name: Linda Vargas  [id number]  [id number]  Date of Service: 04/05/2020  Chief Complaint  Patient presents with  . Follow-up  . Urinary Tract Infection    lots of pressure; urinating frequently  . Ear Pain    both ears hurt; sometimes has fluid come out from them     The patient is here for routine follow up. Was started on trazodone at her last visit to help with sleep. She takes 1/2 tablet most nights. States that this does help her sleep and does not cause negative side effects.  Started having urinary frequency and pressure for a few weeks. States that initially, started when she started her menstrual cycle. Normal for her to have frequency when on her menstrual cycle, so she did not think much of it. This has continued even after the cycle ended. She is also having bladder urgency and pressure. There is some pelvic soreness as well.  States that her ears have also been draining and they are sore. She states that she gets a little dizzy when she first stands up. States that she does have eczema in her ears and unsure if this is related to eczema or if there may be infection. Symptoms have been going on for about a week.       Current Medication: Outpatient Encounter Medications as of 04/05/2020  Medication Sig  . acetaminophen (TYLENOL) 500 MG tablet Take 1,000 mg by mouth every 8 (eight) hours as needed for headache.   . Azelastine HCl 0.15 % SOLN INL 2 PFS IEN QD  . EPIPEN 2-PAK 0.3 MG/0.3ML SOAJ injection INJECT INTRAMUSCULARLY AS DIRECTED FOR ANAPHYLATIC REACTIONS  . ergocalciferol (DRISDOL) 1.25 MG (50000 UT) capsule Take 1 capsule (50,000 Units total) by mouth once a week.  Marland Kitchen FLUoxetine (PROZAC) 10 MG tablet Take 1 tablet (10 mg total) by mouth daily.  . hydrochlorothiazide (HYDRODIURIL) 25 MG tablet Take 1 tablet (25 mg total) by mouth daily.  .  hydroxychloroquine (PLAQUENIL) 200 MG tablet Take 200 mg by mouth 2 (two) times daily.    . hydrOXYzine (ATARAX/VISTARIL) 25 MG tablet Take 25 mg by mouth daily.  Marland Kitchen losartan (COZAAR) 50 MG tablet Take 1 tablet (50 mg total) by mouth daily.  . ondansetron (ZOFRAN ODT) 4 MG disintegrating tablet Take 1 tablet (4 mg total) by mouth every 8 (eight) hours as needed for nausea or vomiting.  . traZODone (DESYREL) 50 MG tablet Take 1/2 to 1 tablet po QHS prn insomnia.  . [DISCONTINUED] traZODone (DESYREL) 50 MG tablet Take 1/2 to 1 tablet po QHS prn insomnia.  . ciprofloxacin-dexamethasone (CIPRODEX) OTIC suspension Place 4 drops into the right ear 2 (two) times daily.  . nitrofurantoin, macrocrystal-monohydrate, (MACROBID) 100 MG capsule Take 1 capsule (100 mg total) by mouth 2 (two) times daily.   No facility-administered encounter medications on file as of 04/05/2020.    Surgical History: Past Surgical History:  Procedure Laterality Date  . CHOLECYSTECTOMY  2012  . DILATION AND CURETTAGE OF UTERUS  2011  . DILATION AND CURETTAGE OF UTERUS  01/17/2012   Procedure: DILATATION AND CURETTAGE;  Surgeon: Marylynn Pearson, MD;  Location: [facility];  Service: Gynecology;  Laterality: N/A;  Dilatation and curratage, insertion of Bakri Balloon  . FOOT SURGERY    . HARDWARE REMOVAL Right 02/18/2018   Procedure: HARDWARE REMOVAL RIGHT WRIST;  Surgeon: Hessie Knows, MD;  Location:  [facility] ORS;  Service: Orthopedics;  Laterality: Right;  . OPEN REDUCTION INTERNAL FIXATION (ORIF) DISTAL RADIAL FRACTURE Right 11/26/2017   Procedure: OPEN REDUCTION INTERNAL FIXATION (ORIF) DISTAL RADIAL FRACTURE;  Surgeon: Hessie Knows, MD;  Location: [facility] ORS;  Service: Orthopedics;  Laterality: Right;  . TONSILLECTOMY    . WRIST SURGERY Right 02/18/2018    Medical History: Past Medical History:  Diagnosis Date  . Anxiety   . Connective tissue disorder (HCC)    undifferentiated  . Fibromyalgia   . Hypertension   . Lupus ([place])    . Migraine   . Seasonal allergic rhinitis     Family History: Family History  Problem Relation Age of Onset  . Hypertension Mother   . Hypertension Father   . Diabetes Father   . Heart disease Maternal Grandmother   . Heart disease Paternal Grandmother   . Anesthesia problems Neg Hx     Social History   Socioeconomic History  . Marital status: Single    Spouse name: Not on file  . Number of children: Not on file  . Years of education: Not on file  . Highest education level: Not on file  Occupational History  . Not on file  Tobacco Use  . Smoking status: Never Smoker  . Smokeless tobacco: Never Used  Vaping Use  . Vaping Use: Never used  Substance and Sexual Activity  . Alcohol use: No  . Drug use: No  . Sexual activity: Not on file  Other Topics Concern  . Not on file  Social History Narrative   Lives at home.   Social Determinants of Health   Financial Resource Strain:   . Difficulty of Paying Living Expenses:   Food Insecurity:   . Worried About Charity fundraiser in the Last Year:   . Arboriculturist in the Last Year:   Transportation Needs:   . Film/video editor (Medical):   Marland Kitchen Lack of Transportation (Non-Medical):   Physical Activity:   . Days of Exercise per Week:   . Minutes of Exercise per Session:   Stress:   . Feeling of Stress :   Social Connections:   . Frequency of Communication with Friends and Family:   . Frequency of Social Gatherings with Friends and Family:   . Attends Religious Services:   . Active Member of Clubs or Organizations:   . Attends Archivist Meetings:   Marland Kitchen Marital Status:   Intimate Partner Violence:   . Fear of Current or Ex-Partner:   . Emotionally Abused:   Marland Kitchen Physically Abused:   . Sexually Abused:       Review of Systems  Constitutional: Negative for activity change, chills, fatigue and unexpected weight change.  HENT: Positive for ear discharge and ear pain. Negative for congestion, postnasal  drip, rhinorrhea, sneezing and sore throat.   Respiratory: Negative for cough, chest tightness and shortness of breath.   Cardiovascular: Negative for chest pain and palpitations.  Gastrointestinal: Negative for abdominal pain, constipation, diarrhea, nausea and vomiting.  Endocrine: Negative for cold intolerance, heat intolerance, polydipsia and polyuria.  Genitourinary: Positive for dysuria, frequency and urgency.  Musculoskeletal: Positive for back pain. Negative for arthralgias, joint swelling and neck pain.  Skin: Negative for rash.  Allergic/Immunologic: Positive for environmental allergies.  Neurological: Negative for dizziness, tremors, numbness and headaches.  Hematological: Negative for adenopathy. Does not bruise/bleed easily.  Psychiatric/Behavioral: Negative for behavioral problems (Depression), sleep disturbance and suicidal ideas. The patient is not  nervous/anxious.        Patient states that she is sleeping better and doing well with anxiety/depression.     Today's Vitals   04/05/20 0852  BP: 113/75  Pulse: 80  Resp: 16  Temp: (!) 97.2 F (36.2 C)  SpO2: 99%  Weight: 228 lb 12.8 oz (103.8 kg)  Height: 5\' 9"  (1.753 m)   Body mass index is 33.79 kg/m.  Physical Exam Vitals and nursing note reviewed.  Constitutional:      General: She is not in acute distress.    Appearance: Normal appearance. She is well-developed. She is not diaphoretic.  HENT:     Head: Normocephalic and atraumatic.     Right Ear: Tenderness present. Tympanic membrane is erythematous.     Left Ear: Tenderness present. Tympanic membrane is erythematous.     Nose: Nose normal.     Mouth/Throat:     Pharynx: No oropharyngeal exudate.  Eyes:     Pupils: Pupils are equal, round, and reactive to light.  Neck:     Thyroid: No thyromegaly.     Vascular: No JVD.     Trachea: No tracheal deviation.  Cardiovascular:     Rate and Rhythm: Normal rate and regular rhythm.     Heart sounds: Normal  heart sounds. No murmur heard.  No friction rub. No gallop.   Pulmonary:     Effort: Pulmonary effort is normal. No respiratory distress.     Breath sounds: Normal breath sounds. No wheezing or rales.  Chest:     Chest wall: No tenderness.  Abdominal:     General: Bowel sounds are normal.     Palpations: Abdomen is soft.  Genitourinary:    Comments: The urine sample is positive for trace WBC and moderate blood. There is also presence of protein.  Musculoskeletal:        General: Normal range of motion.     Cervical back: Normal range of motion and neck supple.  Lymphadenopathy:     Cervical: No cervical adenopathy.  Skin:    General: Skin is warm and dry.  Neurological:     Mental Status: She is alert and oriented to person, place, and time.     Cranial Nerves: No cranial nerve deficit.  Psychiatric:        Mood and Affect: Mood normal.        Behavior: Behavior normal.        Thought Content: Thought content normal.        Judgment: Judgment normal.   Assessment/Plan: 1. Urinary tract infection with hematuria, site unspecified Start macrobid 100mg  bid for 10 days. Send urine for culture and sensitivity and adjust antibiotics as indicated.  - nitrofurantoin, macrocrystal-monohydrate, (MACROBID) 100 MG capsule; Take 1 capsule (100 mg total) by mouth 2 (two) times daily.  Dispense: 20 capsule; Refill: 0 - CULTURE, URINE COMPREHENSIVE  2. Acute otitis externa of both ears, unspecified type Start ciprodex ear drops. Insert four drops into both ears tiwce daily for next week  - ciprofloxacin-dexamethasone (CIPRODEX) OTIC suspension; Place 4 drops into the right ear 2 (two) times daily.  Dispense: 7.5 mL; Refill: 0  3. Primary insomnia Improved. May continue to take trazodone 50mg  at bedtime as needed. New prescription sent to her pharmacy today  - traZODone (DESYREL) 50 MG tablet; Take 1/2 to 1 tablet po QHS prn insomnia.  Dispense: 30 tablet; Refill: 3  4. Essential  hypertension Stable. Continue bp medication as prescribed.   5. Dysuria -  POCT Urinalysis Dipstick  General Counseling: Nicki verbalizes understanding of the findings of todays visit and agrees with plan of treatment. I have discussed any further diagnostic evaluation that may be needed or ordered today. We also reviewed her medications today. she has been encouraged to call the office with any questions or concerns that should arise related to todays visit.  This patient was seen by Leretha Pol FNP Collaboration with Dr Lavera Guise as a part of collaborative care agreement  Orders Placed This Encounter  Procedures  . CULTURE, URINE COMPREHENSIVE  . POCT Urinalysis Dipstick    Meds ordered this encounter  Medications  . ciprofloxacin-dexamethasone (CIPRODEX) OTIC suspension    Sig: Place 4 drops into the right ear 2 (two) times daily.    Dispense:  7.5 mL    Refill:  0    Order Specific Question:   Supervising Provider    Answer:   Lavera Guise [[id number]]  . nitrofurantoin, macrocrystal-monohydrate, (MACROBID) 100 MG capsule    Sig: Take 1 capsule (100 mg total) by mouth 2 (two) times daily.    Dispense:  20 capsule    Refill:  0    Order Specific Question:   Supervising Provider    Answer:   Lavera Guise [[id number]]  . traZODone (DESYREL) 50 MG tablet    Sig: Take 1/2 to 1 tablet po QHS prn insomnia.    Dispense:  30 tablet    Refill:  3    Order Specific Question:   Supervising Provider    Answer:   Lavera Guise [[id number]]    Total time spent: 30 Minutes  Time spent includes review of chart, medications, test results, and follow up plan with the patient.      Dr Lavera Guise Internal medicine

## 2020-04-09 LAB — CULTURE, URINE COMPREHENSIVE

## 2020-04-09 NOTE — Progress Notes (Signed)
Patient placed on macrobid at time of visit

## 2020-06-06 ENCOUNTER — Encounter: Payer: Self-pay | Admitting: Hospice and Palliative Medicine

## 2020-06-06 ENCOUNTER — Other Ambulatory Visit: Payer: Self-pay | Admitting: Radiology

## 2020-06-06 ENCOUNTER — Ambulatory Visit: Payer: BC Managed Care – PPO | Admitting: Hospice and Palliative Medicine

## 2020-06-06 ENCOUNTER — Other Ambulatory Visit: Payer: BC Managed Care – PPO

## 2020-06-06 DIAGNOSIS — Z20822 Contact with and (suspected) exposure to covid-19: Secondary | ICD-10-CM | POA: Diagnosis not present

## 2020-06-06 DIAGNOSIS — J0140 Acute pansinusitis, unspecified: Secondary | ICD-10-CM | POA: Diagnosis not present

## 2020-06-06 MED ORDER — AMOXICILLIN-POT CLAVULANATE 875-125 MG PO TABS: 1.0000 | ORAL_TABLET | Freq: Two times a day (BID) | ORAL | 0 refills | Status: DC
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
## 2020-06-07 ENCOUNTER — Encounter: Payer: Self-pay | Admitting: Hospice and Palliative Medicine

## 2020-06-08 ENCOUNTER — Telehealth: Payer: Self-pay

## 2020-06-08 ENCOUNTER — Other Ambulatory Visit: Payer: Self-pay

## 2020-06-08 LAB — NOVEL CORONAVIRUS, NAA: SARS-CoV-2, NAA: NOT DETECTED

## 2020-06-08 LAB — SARS-COV-2, NAA 2 DAY TAT

## 2020-06-08 MED ORDER — ALBUTEROL SULFATE HFA 108 (90 BASE) MCG/ACT IN AERS: 2.0000 | INHALATION_SPRAY | Freq: Three times a day (TID) | RESPIRATORY_TRACT | 0 refills | Status: DC | PRN

## 2020-06-08 MED ORDER — PREDNISONE 10 MG PO TABS: ORAL_TABLET | ORAL | 0 refills | Status: DC

## 2020-06-08 NOTE — Telephone Encounter (Signed)
Pt spoke with dr Humphrey Rolls we advised her to stopped amoxicillin and we send prednisone and albuterol and also she can take allegra D every 12 hrs  OTC and also she can used [facility]

## 2020-06-11 ENCOUNTER — Telehealth: Payer: Self-pay

## 2020-06-11 NOTE — Telephone Encounter (Signed)
Spoke with pt she is feeling much better and her rapid covid again was negative

## 2020-07-30 ENCOUNTER — Ambulatory Visit: Payer: BC Managed Care – PPO | Admitting: Nurse Practitioner

## 2020-07-30 ENCOUNTER — Encounter: Payer: Self-pay | Admitting: Nurse Practitioner

## 2020-07-30 ENCOUNTER — Other Ambulatory Visit: Payer: Self-pay

## 2020-07-30 VITALS — BP 122/84 | HR 92 | Temp 97.7°F | Resp 16 | Ht 70.0 in | Wt 231.6 lb

## 2020-07-30 DIAGNOSIS — F321 Major depressive disorder, single episode, moderate: Secondary | ICD-10-CM | POA: Diagnosis not present

## 2020-07-30 DIAGNOSIS — F411 Generalized anxiety disorder: Secondary | ICD-10-CM | POA: Diagnosis not present

## 2020-07-30 DIAGNOSIS — R5383 Other fatigue: Secondary | ICD-10-CM | POA: Diagnosis not present

## 2020-07-30 DIAGNOSIS — M3219 Other organ or system involvement in systemic lupus erythematosus: Secondary | ICD-10-CM

## 2020-07-30 DIAGNOSIS — I10 Essential (primary) hypertension: Secondary | ICD-10-CM

## 2020-07-30 MED ORDER — HYDROXYZINE HCL 25 MG PO TABS: 25.0000 mg | ORAL_TABLET | Freq: Two times a day (BID) | ORAL | 1 refills | Status: DC | PRN

## 2020-07-30 MED ORDER — FLUOXETINE HCL 20 MG PO TABS: 20.0000 mg | ORAL_TABLET | Freq: Every day | ORAL | 1 refills | Status: DC
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
## 2020-08-06 ENCOUNTER — Ambulatory Visit: Payer: BC Managed Care – PPO | Admitting: Nurse Practitioner

## 2020-08-10 ENCOUNTER — Other Ambulatory Visit: Payer: Self-pay

## 2020-08-10 MED ORDER — ONDANSETRON 4 MG PO TBDP
4.0000 mg | ORAL_TABLET | Freq: Three times a day (TID) | ORAL | 0 refills | Status: DC | PRN
Start: 2020-08-10 — End: 2020-11-08

## 2020-08-10 NOTE — Telephone Encounter (Signed)
Pt called need refills for zofran due to having nausea as per taylor send 20 tab

## 2020-08-21 ENCOUNTER — Encounter: Payer: Self-pay | Admitting: Nurse Practitioner

## 2020-08-28 ENCOUNTER — Encounter: Payer: Self-pay | Admitting: Nurse Practitioner

## 2020-08-28 ENCOUNTER — Other Ambulatory Visit: Payer: Self-pay

## 2020-08-28 ENCOUNTER — Ambulatory Visit (INDEPENDENT_AMBULATORY_CARE_PROVIDER_SITE_OTHER): Payer: BC Managed Care – PPO | Admitting: Nurse Practitioner

## 2020-08-28 VITALS — BP 141/87 | HR 88 | Temp 97.8°F | Resp 16 | Ht 70.0 in | Wt 233.4 lb

## 2020-08-28 DIAGNOSIS — J0140 Acute pansinusitis, unspecified: Secondary | ICD-10-CM | POA: Diagnosis not present

## 2020-08-28 DIAGNOSIS — R059 Cough, unspecified: Secondary | ICD-10-CM

## 2020-08-28 DIAGNOSIS — F321 Major depressive disorder, single episode, moderate: Secondary | ICD-10-CM | POA: Diagnosis not present

## 2020-08-28 DIAGNOSIS — M3219 Other organ or system involvement in systemic lupus erythematosus: Secondary | ICD-10-CM

## 2020-08-28 DIAGNOSIS — Z0001 Encounter for general adult medical examination with abnormal findings: Secondary | ICD-10-CM | POA: Diagnosis not present

## 2020-08-28 DIAGNOSIS — I10 Essential (primary) hypertension: Secondary | ICD-10-CM

## 2020-08-28 MED ORDER — FLUOXETINE HCL 20 MG PO TABS: 20.0000 mg | ORAL_TABLET | Freq: Every day | ORAL | 1 refills | Status: DC

## 2020-08-28 MED ORDER — AZITHROMYCIN 250 MG PO TABS: ORAL_TABLET | ORAL | 0 refills | Status: DC

## 2020-08-28 MED ORDER — PREDNISONE 10 MG (21) PO TBPK: ORAL_TABLET | ORAL | 0 refills | Status: DC

## 2020-08-28 MED ORDER — LOSARTAN POTASSIUM 50 MG PO TABS: 50.0000 mg | ORAL_TABLET | Freq: Every day | ORAL | 1 refills | Status: DC

## 2020-08-28 NOTE — Progress Notes (Signed)
[facility] [place], [place] [address]  Internal MEDICINE  Office Visit Note  Patient Name: Linda Vargas  [id number]  [id number]  Date of Service: 09/23/2020   Pt is here for routine health maintenance examination  Chief Complaint  Patient presents with  . Annual Exam    may have a cold has been tested for covid waiting on results  . Hypertension  . controlled substance form  . Sinusitis     The patient presents for health maintenance exam. She states her daughter started feeling poorly on Wednesday last week. Patient at home taking care of her daughter. She started feeling poorly on Saturday. She has congestion, sneezing, dry cough, and denies fever. Has taken some tylenol and drank some hot tea. She states that her symptoms are slightly better. States that everyday she feels better and better. She did go for a COVID 19 test yesterday and is still waiting on results. She has been fully vaccinated.  The patient has been on leave from her job via [employer]. She is outdue to lupus flare and increased anxiety and depression which is job related. I increased her fluoxetine to 20mg  daily. She continues to see counselor every other week. Patient states that she is doing better. She believes that returning to work on 09/10/2020 would be better and will fill out form to extend leave until 09/10/2020.    Current Medication: Outpatient Encounter Medications as of 08/28/2020  Medication Sig  . acetaminophen (TYLENOL) 500 MG tablet Take 1,000 mg by mouth every 8 (eight) hours as needed for headache.   . albuterol (VENTOLIN HFA) 108 (90 Base) MCG/ACT inhaler Inhale 2 puffs into the lungs 3 (three) times daily as needed for wheezing or shortness of breath.  . Azelastine HCl 0.15 % SOLN INL 2 PFS IEN QD  . EPIPEN 2-PAK 0.3 MG/0.3ML SOAJ injection INJECT INTRAMUSCULARLY AS DIRECTED FOR ANAPHYLATIC REACTIONS  . ergocalciferol (DRISDOL) 1.25 MG (50000 UT) capsule Take 1  capsule (50,000 Units total) by mouth once a week.  Marland Kitchen FLUoxetine (PROZAC) 20 MG tablet Take 1 tablet (20 mg total) by mouth daily.  . hydrochlorothiazide (HYDRODIURIL) 25 MG tablet Take 1 tablet (25 mg total) by mouth daily.  . hydroxychloroquine (PLAQUENIL) 200 MG tablet Take 200 mg by mouth 2 (two) times daily.  Marland Kitchen losartan (COZAAR) 50 MG tablet Take 1 tablet (50 mg total) by mouth daily.  . ondansetron (ZOFRAN ODT) 4 MG disintegrating tablet Take 1 tablet (4 mg total) by mouth every 8 (eight) hours as needed for nausea or vomiting.  . [DISCONTINUED] FLUoxetine (PROZAC) 20 MG tablet Take 1 tablet (20 mg total) by mouth daily.  . [DISCONTINUED] hydrOXYzine (ATARAX/VISTARIL) 25 MG tablet Take 1 tablet (25 mg total) by mouth 2 (two) times daily as needed.  . [DISCONTINUED] losartan (COZAAR) 50 MG tablet Take 1 tablet (50 mg total) by mouth daily.  . [DISCONTINUED] nitrofurantoin, macrocrystal-monohydrate, (MACROBID) 100 MG capsule Take 1 capsule (100 mg total) by mouth 2 (two) times daily.  . [DISCONTINUED] predniSONE (DELTASONE) 10 MG tablet Take 1  Tab po three times a day today then 2 tab po daily for 2 days and then 1 tab po daily until she finished  . [DISCONTINUED] traZODone (DESYREL) 50 MG tablet Take 1/2 to 1 tablet po QHS prn insomnia.  . [DISCONTINUED] azithromycin (ZITHROMAX) 250 MG tablet z-pack - take as directed for 5 days  . [DISCONTINUED] predniSONE (STERAPRED UNI-PAK 21 TAB) 10 MG (21) TBPK tablet 6  day taper - take by mouth as directed for 6 days   No facility-administered encounter medications on file as of 08/28/2020.    Surgical History: Past Surgical History:  Procedure Laterality Date  . CHOLECYSTECTOMY  2012  . DILATION AND CURETTAGE OF UTERUS  2011  . DILATION AND CURETTAGE OF UTERUS  01/17/2012   Procedure: DILATATION AND CURETTAGE;  Surgeon: Marylynn Pearson, MD;  Location: [facility];  Service: Gynecology;  Laterality: N/A;  Dilatation and curratage, insertion of Bakri  Balloon  . FOOT SURGERY    . HARDWARE REMOVAL Right 02/18/2018   Procedure: HARDWARE REMOVAL RIGHT WRIST;  Surgeon: Hessie Knows, MD;  Location: [facility];  Service: Orthopedics;  Laterality: Right;  . OPEN REDUCTION INTERNAL FIXATION (ORIF) DISTAL RADIAL FRACTURE Right 11/26/2017   Procedure: OPEN REDUCTION INTERNAL FIXATION (ORIF) DISTAL RADIAL FRACTURE;  Surgeon: Hessie Knows, MD;  Location: [facility];  Service: Orthopedics;  Laterality: Right;  . TONSILLECTOMY    . WRIST SURGERY Right 02/18/2018    Medical History: Past Medical History:  Diagnosis Date  . Anxiety   . Connective tissue disorder (HCC)    undifferentiated  . Fibromyalgia   . Hypertension   . Lupus ([place])   . Migraine   . Seasonal allergic rhinitis     Family History: Family History  Problem Relation Age of Onset  . Hypertension Mother   . Hypertension Father   . Diabetes Father   . Heart disease Maternal Grandmother   . Heart disease Paternal Grandmother   . Anesthesia problems Neg Hx       Review of Systems  Constitutional: Positive for activity change and fatigue. Negative for chills and unexpected weight change.       Patient is participating in very few activities she would normally enjoy and find happiness in. She is now seeing her counselor every other week.   HENT: Positive for congestion, rhinorrhea, sinus pressure, sinus pain and sore throat. Negative for postnasal drip and sneezing.   Respiratory: Positive for cough. Negative for chest tightness and shortness of breath.   Cardiovascular: Negative for chest pain and palpitations.  Gastrointestinal: Positive for nausea. Negative for abdominal pain, constipation, diarrhea and vomiting.  Endocrine: Negative for cold intolerance, heat intolerance, polydipsia and polyuria.  Musculoskeletal: Positive for arthralgias and joint swelling. Negative for back pain and neck pain.  Skin: Negative for rash.  Allergic/Immunologic: Positive for environmental  allergies.  Neurological: Positive for headaches. Negative for dizziness, tremors and numbness.  Hematological: Positive for adenopathy. Does not bruise/bleed easily.  Psychiatric/Behavioral: Positive for dysphoric mood and sleep disturbance. Negative for behavioral problems (Depression) and suicidal ideas. The patient is nervous/anxious.      Today's Vitals   08/28/20 1440  BP: (!) 141/87  Pulse: 88  Resp: 16  Temp: 97.8 F (36.6 C)  SpO2: 98%  Weight: 233 lb 6.4 oz (105.9 kg)  Height: 5\' 10"  (1.778 m)   Body mass index is 33.49 kg/m.  Physical Exam Vitals and nursing note reviewed.  Constitutional:      General: She is not in acute distress.    Appearance: Normal appearance. She is well-developed and well-nourished. She is obese. She is ill-appearing. She is not diaphoretic.  HENT:     Head: Normocephalic and atraumatic.     Right Ear: Tympanic membrane is erythematous and bulging.     Left Ear: Tympanic membrane is erythematous and bulging.     Nose: Congestion present.     Right Turbinates: Enlarged.  Left Turbinates: Enlarged.     Right Sinus: Maxillary sinus tenderness and frontal sinus tenderness present.     Left Sinus: Maxillary sinus tenderness and frontal sinus tenderness present.     Mouth/Throat:     Pharynx: Posterior oropharyngeal erythema present. No oropharyngeal exudate.  Eyes:     Extraocular Movements: EOM normal.     Pupils: Pupils are equal, round, and reactive to light.  Neck:     Thyroid: No thyromegaly.     Vascular: No JVD.     Trachea: No tracheal deviation.  Cardiovascular:     Rate and Rhythm: Normal rate and regular rhythm.     Pulses: Normal pulses.     Heart sounds: Normal heart sounds. No murmur heard. No friction rub. No gallop.   Pulmonary:     Effort: Pulmonary effort is normal. No respiratory distress.     Breath sounds: Normal breath sounds. No wheezing or rales.  Chest:     Chest wall: No tenderness.  Abdominal:      General: Bowel sounds are normal.     Palpations: Abdomen is soft.     Tenderness: There is no abdominal tenderness.  Musculoskeletal:        General: Normal range of motion.     Cervical back: Normal range of motion and neck supple.  Lymphadenopathy:     Cervical: Cervical adenopathy present.  Skin:    General: Skin is warm and dry.     Capillary Refill: Capillary refill takes less than 2 seconds.  Neurological:     General: No focal deficit present.     Mental Status: She is alert and oriented to person, place, and time.     Cranial Nerves: No cranial nerve deficit.  Psychiatric:        Attention and Perception: Attention and perception normal.        Mood and Affect: Mood is anxious and depressed. Affect is tearful.        Speech: Speech normal.        Behavior: Behavior normal. Behavior is cooperative.        Thought Content: Thought content normal.        Cognition and Memory: Cognition and memory normal.        Judgment: Judgment normal.    Assessment/Plan: 1. Encounter for general adult medical examination with abnormal findings Annual health maintenance exam today.   2. Acute non-recurrent pansinusitis Start z-pack. Take as directed for 5 days. Rest and increase fluids. Take OTC medications as needed and as indicated to alleviate acute symptoms. Add prednisone taper. Take as directed for 6 days. Will extend FMLA due to new symptomss present today.   3. Cough Chest x-ray for further evaluation.  - DG Chest 2 View; Future  4. Depression, major, single episode, moderate (HCC) Stable. Continue prozac 20mg  daily. Patient should continue with counseling every other week.  - FLUoxetine (PROZAC) 20 MG tablet; Take 1 tablet (20 mg total) by mouth daily.  Dispense: 90 tablet; Refill: 1  5. Essential hypertension Stable. Continue bp medication as prescribed  - losartan (COZAAR) 50 MG tablet; Take 1 tablet (50 mg total) by mouth daily.  Dispense: 90 tablet; Refill: 1  6.  Systemic lupus erythematosus with other organ involvement, unspecified SLE type ([place]) Currently in lupus flare. Continue regular visits with rheumatology as scheduled.   General Counseling: Joycie verbalizes understanding of the findings of todays visit and agrees with plan of treatment. I have discussed any further diagnostic  evaluation that may be needed or ordered today. We also reviewed her medications today. she has been encouraged to call the office with any questions or concerns that should arise related to todays visit.    Counseling:  This patient was seen by Leretha Pol FNP Collaboration with Dr Lavera Guise as a part of collaborative care agreement  Orders Placed This Encounter  Procedures  . DG Chest 2 View    Meds ordered this encounter  Medications  . DISCONTD: azithromycin (ZITHROMAX) 250 MG tablet    Sig: z-pack - take as directed for 5 days    Dispense:  6 tablet    Refill:  0    Order Specific Question:   Supervising Provider    Answer:   Lavera Guise [place]  . DISCONTD: predniSONE (STERAPRED UNI-PAK 21 TAB) 10 MG (21) TBPK tablet    Sig: 6 day taper - take by mouth as directed for 6 days    Dispense:  21 tablet    Refill:  0    Order Specific Question:   Supervising Provider    Answer:   Lavera Guise [place]  . FLUoxetine (PROZAC) 20 MG tablet    Sig: Take 1 tablet (20 mg total) by mouth daily.    Dispense:  90 tablet    Refill:  1    Order Specific Question:   Supervising Provider    Answer:   Lavera Guise [[id number]]  . losartan (COZAAR) 50 MG tablet    Sig: Take 1 tablet (50 mg total) by mouth daily.    Dispense:  90 tablet    Refill:  1    Order Specific Question:   Supervising Provider    Answer:   Lavera Guise [[id number]]    Total time spent: 31 Minutes  Time spent includes review of chart, medications, test results, and follow up plan with the patient.     Lavera Guise, MD  Internal Medicine

## 2020-08-29 NOTE — Telephone Encounter (Signed)
Negative covid results.

## 2020-09-02 ENCOUNTER — Encounter: Payer: Self-pay | Admitting: Nurse Practitioner

## 2020-09-10 ENCOUNTER — Ambulatory Visit
Admission: RE | Admit: 2020-09-10 | Discharge: 2020-09-10 | Disposition: A | Discharge status: Home / Self Care | Payer: BC Managed Care – PPO | Attending: Nurse Practitioner | Admitting: Nurse Practitioner

## 2020-09-10 ENCOUNTER — Other Ambulatory Visit: Payer: Self-pay

## 2020-09-10 ENCOUNTER — Ambulatory Visit
Admission: RE | Admit: 2020-09-10 | Discharge: 2020-09-10 | Disposition: A | Discharge status: Home / Self Care | Payer: BC Managed Care – PPO | Source: Ambulatory Visit | Attending: Nurse Practitioner | Admitting: Nurse Practitioner

## 2020-09-10 ENCOUNTER — Ambulatory Visit: Payer: BC Managed Care – PPO | Admitting: Nurse Practitioner

## 2020-09-10 ENCOUNTER — Encounter: Payer: Self-pay | Admitting: Nurse Practitioner

## 2020-09-10 VITALS — BP 123/83 | HR 93 | Temp 97.8°F | Resp 16 | Ht 70.0 in | Wt 236.6 lb

## 2020-09-10 DIAGNOSIS — J0140 Acute pansinusitis, unspecified: Secondary | ICD-10-CM | POA: Diagnosis not present

## 2020-09-10 DIAGNOSIS — R059 Cough, unspecified: Secondary | ICD-10-CM | POA: Diagnosis not present

## 2020-09-10 DIAGNOSIS — J069 Acute upper respiratory infection, unspecified: Secondary | ICD-10-CM

## 2020-09-10 MED ORDER — PREDNISONE 10 MG (21) PO TBPK: ORAL_TABLET | ORAL | 0 refills | Status: DC

## 2020-09-10 MED ORDER — CEFUROXIME AXETIL 500 MG PO TABS: 500.0000 mg | ORAL_TABLET | Freq: Two times a day (BID) | ORAL | 0 refills | Status: DC

## 2020-09-10 MED ORDER — HYDROCOD POLST-CPM POLST ER 10-8 MG/5ML PO SUER: 5.0000 mL | Freq: Two times a day (BID) | ORAL | 0 refills | Status: DC | PRN

## 2020-09-10 NOTE — Progress Notes (Signed)
Please let her know that her chest x-ray shows no pneumonia or other acute abnormalities. Thanks.

## 2020-09-10 NOTE — Progress Notes (Signed)
[facility] [place], [place] [address]  Internal MEDICINE  Office Visit Note  Patient Name: Linda Vargas  [id number]  [id number]  Date of Service: 09/10/2020  Chief Complaint  Patient presents with  . Acute Visit  . Cough    worse when laying down, chest hurts  . Headache    since weds  . Nasal Congestion    mostly gone     The patient is here for acute visit. She is having chest tightness, cough, sinus headache, and ear pain on right side. She was having similar symptoms at her visit 08/28/2020. She was treated with z-pack and prednisone taper for 6 days. She had negative COVID test. She states that, initially, she felt better. On this past Wednesday, cough started. Cough is deep and congested, and hurts her chest when she coughs. Also hurts in her chest when she takes a deep breath. Cough is worse when she is lying down, especially on her back. She has been drinking hot tea and lemon which helps for short periods of time. She denies fever, chills, or body aches. The patient will be given work note keeping her out of work from 09/10/2020 through 09/14/2020 due to illness. She will also get new COVID 19 test and will self isolate until negative results received   Pt is here for a sick visit.     Current Medication:  Outpatient Encounter Medications as of 09/10/2020  Medication Sig  . acetaminophen (TYLENOL) 500 MG tablet Take 1,000 mg by mouth every 8 (eight) hours as needed for headache.   . albuterol (VENTOLIN HFA) 108 (90 Base) MCG/ACT inhaler Inhale 2 puffs into the lungs 3 (three) times daily as needed for wheezing or shortness of breath.  . Azelastine HCl 0.15 % SOLN INL 2 PFS IEN QD  . EPIPEN 2-PAK 0.3 MG/0.3ML SOAJ injection INJECT INTRAMUSCULARLY AS DIRECTED FOR ANAPHYLATIC REACTIONS  . ergocalciferol (DRISDOL) 1.25 MG (50000 UT) capsule Take 1 capsule (50,000 Units total) by mouth once a week.  Marland Kitchen FLUoxetine (PROZAC) 20 MG tablet Take 1  tablet (20 mg total) by mouth daily.  . hydrochlorothiazide (HYDRODIURIL) 25 MG tablet Take 1 tablet (25 mg total) by mouth daily.  . hydroxychloroquine (PLAQUENIL) 200 MG tablet Take 200 mg by mouth 2 (two) times daily.    . hydrOXYzine (ATARAX/VISTARIL) 25 MG tablet Take 1 tablet (25 mg total) by mouth 2 (two) times daily as needed.  Marland Kitchen losartan (COZAAR) 50 MG tablet Take 1 tablet (50 mg total) by mouth daily.  . ondansetron (ZOFRAN ODT) 4 MG disintegrating tablet Take 1 tablet (4 mg total) by mouth every 8 (eight) hours as needed for nausea or vomiting.  . predniSONE (STERAPRED UNI-PAK 21 TAB) 10 MG (21) TBPK tablet 6 day taper - take by mouth as directed for 6 days  . traZODone (DESYREL) 50 MG tablet Take 1/2 to 1 tablet po QHS prn insomnia.  . [DISCONTINUED] azithromycin (ZITHROMAX) 250 MG tablet z-pack - take as directed for 5 days  . [DISCONTINUED] nitrofurantoin, macrocrystal-monohydrate, (MACROBID) 100 MG capsule Take 1 capsule (100 mg total) by mouth 2 (two) times daily.  . [DISCONTINUED] predniSONE (STERAPRED UNI-PAK 21 TAB) 10 MG (21) TBPK tablet 6 day taper - take by mouth as directed for 6 days  . cefUROXime (CEFTIN) 500 MG tablet Take 1 tablet (500 mg total) by mouth 2 (two) times daily with a meal.  . chlorpheniramine-HYDROcodone (TUSSIONEX PENNKINETIC ER) 10-8 MG/5ML SUER Take 5 mLs by  mouth every 12 (twelve) hours as needed for cough.   No facility-administered encounter medications on file as of 09/10/2020.      Medical History: Past Medical History:  Diagnosis Date  . Anxiety   . Connective tissue disorder (HCC)    undifferentiated  . Fibromyalgia   . Hypertension   . Lupus ([place])   . Migraine   . Seasonal allergic rhinitis     Today's Vitals   09/10/20 1016  BP: 123/83  Pulse: 93  Resp: 16  Temp: 97.8 F (36.6 C)  SpO2: 99%  Weight: 236 lb 9.6 oz (107.3 kg)  Height: 5\' 10"  (1.778 m)   Body mass index is 33.95 kg/m.  Review of Systems  Constitutional:  Positive for chills and fatigue.  HENT: Positive for congestion, ear pain, postnasal drip, rhinorrhea, sinus pain and sore throat. Negative for voice change.   Respiratory: Positive for cough and wheezing.   Cardiovascular: Negative for chest pain and palpitations.  Gastrointestinal: Negative for nausea.  Musculoskeletal: Positive for myalgias.  Skin: Negative.   Allergic/Immunologic: Negative.   Neurological: Positive for headaches.  Hematological: Positive for adenopathy.    Physical Exam Vitals and nursing note reviewed.  Constitutional:      General: She is not in acute distress.    Appearance: Normal appearance. She is well-developed. She is ill-appearing. She is not diaphoretic.  HENT:     Head: Normocephalic and atraumatic.     Right Ear: Tympanic membrane is bulging.     Left Ear: Tympanic membrane is erythematous and bulging.     Nose: Congestion present.     Right Sinus: Frontal sinus tenderness present.     Left Sinus: Frontal sinus tenderness present.     Mouth/Throat:     Pharynx: No oropharyngeal exudate.  Eyes:     Pupils: Pupils are equal, round, and reactive to light.  Neck:     Thyroid: No thyromegaly.     Vascular: No JVD.     Trachea: No tracheal deviation.  Cardiovascular:     Rate and Rhythm: Normal rate and regular rhythm.     Heart sounds: Normal heart sounds. No murmur heard.  No friction rub. No gallop.   Pulmonary:     Effort: Pulmonary effort is normal. No respiratory distress.     Breath sounds: Normal breath sounds. No wheezing or rales.     Comments: Deep, congested cough. Non-productive  Chest:     Chest wall: No tenderness.  Abdominal:     Palpations: Abdomen is soft.  Musculoskeletal:        General: Normal range of motion.     Cervical back: Normal range of motion and neck supple.  Lymphadenopathy:     Cervical: No cervical adenopathy.  Skin:    General: Skin is warm and dry.  Neurological:     Mental Status: She is alert and  oriented to person, place, and time.     Cranial Nerves: No cranial nerve deficit.  Psychiatric:        Mood and Affect: Mood normal.        Behavior: Behavior normal.        Thought Content: Thought content normal.        Judgment: Judgment normal.    Assessment/Plan: 1. Acute upper respiratory infection Will do round ceftin 500mg  twice daily for next 10 days. Repeat prednisone taper. Take as directed for 6 days. Rest and increase fluids. Recommend OTC medications to take as needed and as  indicated for symptoms relief.  - cefUROXime (CEFTIN) 500 MG tablet; Take 1 tablet (500 mg total) by mouth 2 (two) times daily with a meal.  Dispense: 20 tablet; Refill: 0 - predniSONE (STERAPRED UNI-PAK 21 TAB) 10 MG (21) TBPK tablet; 6 day taper - take by mouth as directed for 6 days  Dispense: 21 tablet; Refill: 0  2. Cough tussionex may be taken up to twice daily as needed for cough. Prescribed tussionex cough suppressant to take twice daily as needed for cough. Recommended she use at night and when at home only, as this medication can cause significant drowsiness and dissiness. Will get chest x-ray for further evaluation . - chlorpheniramine-HYDROcodone (TUSSIONEX PENNKINETIC ER) 10-8 MG/5ML SUER; Take 5 mLs by mouth every 12 (twelve) hours as needed for cough.  Dispense: 115 mL; Refill: 0  General Counseling: Linda Vargas verbalizes understanding of the findings of todays visit and agrees with plan of treatment. I have discussed any further diagnostic evaluation that may be needed or ordered today. We also reviewed her medications today. she has been encouraged to call the office with any questions or concerns that should arise related to todays visit.    Counseling:   Rest and increase fluids. Continue using OTC medication to control symptoms.   This patient was seen by [place] with Dr Lavera Guise as a part of collaborative care agreement  Meds ordered this encounter   Medications  . cefUROXime (CEFTIN) 500 MG tablet    Sig: Take 1 tablet (500 mg total) by mouth 2 (two) times daily with a meal.    Dispense:  20 tablet    Refill:  0    Order Specific Question:   Supervising Provider    Answer:   Lavera Guise [place]  . predniSONE (STERAPRED UNI-PAK 21 TAB) 10 MG (21) TBPK tablet    Sig: 6 day taper - take by mouth as directed for 6 days    Dispense:  21 tablet    Refill:  0    Order Specific Question:   Supervising Provider    Answer:   Lavera Guise [place]  . chlorpheniramine-HYDROcodone (TUSSIONEX PENNKINETIC ER) 10-8 MG/5ML SUER    Sig: Take 5 mLs by mouth every 12 (twelve) hours as needed for cough.    Dispense:  115 mL    Refill:  0    Order Specific Question:   Supervising Provider    Answer:   Lavera Guise [[id number]]    Time spent: 25 Minutes

## 2020-09-10 NOTE — Telephone Encounter (Signed)
Hey. I gave her a work note this morning. Do you know if there is anything else we need to do?

## 2020-09-13 ENCOUNTER — Encounter: Payer: Self-pay | Admitting: Nurse Practitioner

## 2020-09-18 ENCOUNTER — Encounter: Payer: Self-pay | Admitting: Nurse Practitioner

## 2020-09-18 NOTE — Telephone Encounter (Signed)
Can you let her know that she has appointment with me on 12/10 and we can talk then? Thanks

## 2020-09-21 ENCOUNTER — Other Ambulatory Visit: Payer: Self-pay

## 2020-09-21 ENCOUNTER — Ambulatory Visit: Payer: BC Managed Care – PPO | Admitting: Nurse Practitioner

## 2020-09-21 ENCOUNTER — Encounter: Payer: Self-pay | Admitting: Nurse Practitioner

## 2020-09-21 VITALS — BP 138/89 | HR 87 | Temp 98.2°F | Resp 16 | Ht 70.0 in | Wt 236.2 lb

## 2020-09-21 DIAGNOSIS — F321 Major depressive disorder, single episode, moderate: Secondary | ICD-10-CM

## 2020-09-21 DIAGNOSIS — I10 Essential (primary) hypertension: Secondary | ICD-10-CM | POA: Diagnosis not present

## 2020-09-21 DIAGNOSIS — F5101 Primary insomnia: Secondary | ICD-10-CM

## 2020-09-21 DIAGNOSIS — F411 Generalized anxiety disorder: Secondary | ICD-10-CM

## 2020-09-21 DIAGNOSIS — M359 Systemic involvement of connective tissue, unspecified: Secondary | ICD-10-CM

## 2020-09-21 DIAGNOSIS — M329 Systemic lupus erythematosus, unspecified: Secondary | ICD-10-CM | POA: Diagnosis not present

## 2020-09-21 MED ORDER — DULOXETINE HCL 20 MG PO CPEP: 20.0000 mg | ORAL_CAPSULE | Freq: Every day | ORAL | 3 refills | Status: DC

## 2020-09-21 MED ORDER — TRAZODONE HCL 50 MG PO TABS: ORAL_TABLET | ORAL | 3 refills | Status: DC

## 2020-09-21 MED ORDER — HYDROXYZINE HCL 25 MG PO TABS: 25.0000 mg | ORAL_TABLET | Freq: Two times a day (BID) | ORAL | 1 refills | Status: DC | PRN

## 2020-09-21 NOTE — Progress Notes (Signed)
[facility] [place], [place] [address]  Internal MEDICINE  Office Visit Note  Patient Name: Linda Vargas  [id number]  [id number]  Date of Service: 10/21/2020  Chief Complaint  Patient presents with  . Follow-up  . Hypertension  . controlled substance form    Reviewed with PT  . Anxiety    The patient is here for follow up visit. She is having flare of lupus and increased depression with anxiety. She is tearful during the visit. States that she is very fatigued. Unclear if fatigue is from lupus flare or if it is coming from increased depression. She states that it is taking every bit of energy she has just to get out of bed. She states that her mind is constantly spinning and this is keeping her awake, but also making it difficult for her to get out of bed. She is currently out of work due to [employer] and current paperwork is keeping her out of work through 09/21/2020, going back to work on 09/24/2020. Due to severity of and worsening of symptoms, will extend FMLA to go back to work on 11/05/2020.       Current Medication: Outpatient Encounter Medications as of 09/21/2020  Medication Sig Note  . acetaminophen (TYLENOL) 500 MG tablet Take 1,000 mg by mouth every 8 (eight) hours as needed for headache.    . albuterol (VENTOLIN HFA) 108 (90 Base) MCG/ACT inhaler Inhale 2 puffs into the lungs 3 (three) times daily as needed for wheezing or shortness of breath.   . Azelastine HCl 0.15 % SOLN INL 2 PFS IEN QD   . EPIPEN 2-PAK 0.3 MG/0.3ML SOAJ injection INJECT INTRAMUSCULARLY AS DIRECTED FOR ANAPHYLATIC REACTIONS   . ergocalciferol (DRISDOL) 1.25 MG (50000 UT) capsule Take 1 capsule (50,000 Units total) by mouth once a week.   . hydrochlorothiazide (HYDRODIURIL) 25 MG tablet Take 1 tablet (25 mg total) by mouth daily.   . hydroxychloroquine (PLAQUENIL) 200 MG tablet Take 200 mg by mouth 2 (two) times daily.   Marland Kitchen losartan (COZAAR) 50 MG tablet Take 1 tablet (50 mg  total) by mouth daily.   . ondansetron (ZOFRAN ODT) 4 MG disintegrating tablet Take 1 tablet (4 mg total) by mouth every 8 (eight) hours as needed for nausea or vomiting.   . [DISCONTINUED] cefUROXime (CEFTIN) 500 MG tablet Take 1 tablet (500 mg total) by mouth 2 (two) times daily with a meal.   . [DISCONTINUED] chlorpheniramine-HYDROcodone (TUSSIONEX PENNKINETIC ER) 10-8 MG/5ML SUER Take 5 mLs by mouth every 12 (twelve) hours as needed for cough.   . [DISCONTINUED] FLUoxetine (PROZAC) 20 MG tablet Take 1 tablet (20 mg total) by mouth daily. 10/19/2020: patient was changed to duloxetine and weaned off fluoxetine  . [DISCONTINUED] hydrOXYzine (ATARAX/VISTARIL) 25 MG tablet Take 1 tablet (25 mg total) by mouth 2 (two) times daily as needed.   . [DISCONTINUED] predniSONE (STERAPRED UNI-PAK 21 TAB) 10 MG (21) TBPK tablet 6 day taper - take by mouth as directed for 6 days   . [DISCONTINUED] traZODone (DESYREL) 50 MG tablet Take 1/2 to 1 tablet po QHS prn insomnia.   . hydrOXYzine (ATARAX/VISTARIL) 25 MG tablet Take 1 tablet (25 mg total) by mouth 2 (two) times daily as needed.   . traZODone (DESYREL) 50 MG tablet Take 1 to 2 tablets po QHS prn anxiety   . [DISCONTINUED] DULoxetine (CYMBALTA) 20 MG capsule Take 1 capsule (20 mg total) by mouth daily.    No facility-administered encounter medications  on file as of 09/21/2020.    Surgical History: Past Surgical History:  Procedure Laterality Date  . CHOLECYSTECTOMY  2012  . DILATION AND CURETTAGE OF UTERUS  2011  . DILATION AND CURETTAGE OF UTERUS  01/17/2012   Procedure: DILATATION AND CURETTAGE;  Surgeon: Marylynn Pearson, MD;  Location: [facility];  Service: Gynecology;  Laterality: N/A;  Dilatation and curratage, insertion of Bakri Balloon  . FOOT SURGERY    . HARDWARE REMOVAL Right 02/18/2018   Procedure: HARDWARE REMOVAL RIGHT WRIST;  Surgeon: Hessie Knows, MD;  Location: [facility];  Service: Orthopedics;  Laterality: Right;  . OPEN REDUCTION INTERNAL  FIXATION (ORIF) DISTAL RADIAL FRACTURE Right 11/26/2017   Procedure: OPEN REDUCTION INTERNAL FIXATION (ORIF) DISTAL RADIAL FRACTURE;  Surgeon: Hessie Knows, MD;  Location: [facility];  Service: Orthopedics;  Laterality: Right;  . TONSILLECTOMY    . WRIST SURGERY Right 02/18/2018    Medical History: Past Medical History:  Diagnosis Date  . Anxiety   . Connective tissue disorder (HCC)    undifferentiated  . Fibromyalgia   . Hypertension   . Lupus ([place])   . Migraine   . Seasonal allergic rhinitis     Family History: Family History  Problem Relation Age of Onset  . Hypertension Mother   . Hypertension Father   . Diabetes Father   . Heart disease Maternal Grandmother   . Heart disease Paternal Grandmother   . Anesthesia problems Neg Hx     Social History   Socioeconomic History  . Marital status: Single    Spouse name: Not on file  . Number of children: Not on file  . Years of education: Not on file  . Highest education level: Not on file  Occupational History  . Not on file  Tobacco Use  . Smoking status: Never Smoker  . Smokeless tobacco: Never Used  Vaping Use  . Vaping Use: Never used  Substance and Sexual Activity  . Alcohol use: No  . Drug use: No  . Sexual activity: Not on file  Other Topics Concern  . Not on file  Social History Narrative   Lives at home.   Social Determinants of Health   Financial Resource Strain: Not on file  Food Insecurity: Not on file  Transportation Needs: Not on file  Physical Activity: Not on file  Stress: Not on file  Social Connections: Not on file  Intimate Partner Violence: Not on file      Review of Systems  Constitutional: Positive for activity change, appetite change and fatigue. Negative for chills and unexpected weight change.       The patient is not participating in activities she would normally find enjoyable.   HENT: Negative for congestion, postnasal drip, rhinorrhea, sneezing and sore throat.    Respiratory: Negative for cough, chest tightness and shortness of breath.   Cardiovascular: Negative for chest pain and palpitations.  Gastrointestinal: Positive for nausea. Negative for abdominal pain, constipation, diarrhea and vomiting.  Endocrine: Negative for heat intolerance, polydipsia and polyuria.  Musculoskeletal: Positive for arthralgias and myalgias. Negative for back pain, joint swelling and neck pain.       Probable lupus flare.   Skin: Negative for rash.  Allergic/Immunologic: Positive for environmental allergies.  Neurological: Positive for headaches. Negative for tremors and numbness.  Hematological: Negative for adenopathy. Does not bruise/bleed easily.  Psychiatric/Behavioral: Positive for dysphoric mood and sleep disturbance. Negative for behavioral problems (Depression) and suicidal ideas. The patient is nervous/anxious.     Today's  Vitals   09/21/20 0914  BP: 138/89  Pulse: 87  Resp: 16  Temp: 98.2 F (36.8 C)  SpO2: 99%  Weight: 236 lb 3.2 oz (107.1 kg)  Height: 5\' 10"  (1.778 m)   Body mass index is 33.89 kg/m.  Physical Exam Vitals and nursing note reviewed.  Constitutional:      General: She is not in acute distress.    Appearance: Normal appearance. She is well-developed and well-nourished. She is not diaphoretic.  HENT:     Head: Normocephalic and atraumatic.     Nose: Nose normal.     Mouth/Throat:     Mouth: Oropharynx is clear and moist.     Pharynx: No oropharyngeal exudate.  Eyes:     Extraocular Movements: EOM normal.     Pupils: Pupils are equal, round, and reactive to light.  Neck:     Thyroid: No thyromegaly.     Vascular: No carotid bruit or JVD.     Trachea: No tracheal deviation.  Cardiovascular:     Rate and Rhythm: Normal rate and regular rhythm.     Heart sounds: Normal heart sounds. No murmur heard. No friction rub. No gallop.   Pulmonary:     Effort: Pulmonary effort is normal. No respiratory distress.     Breath sounds:  Normal breath sounds. No wheezing or rales.  Chest:     Chest wall: No tenderness.  Abdominal:     Palpations: Abdomen is soft.  Musculoskeletal:        General: Normal range of motion.     Cervical back: Normal range of motion and neck supple.     Comments: Generalized joint pain with point tenderness present.   Lymphadenopathy:     Cervical: No cervical adenopathy.  Skin:    General: Skin is warm and dry.  Neurological:     General: No focal deficit present.     Mental Status: She is alert and oriented to person, place, and time.     Cranial Nerves: No cranial nerve deficit.  Psychiatric:        Attention and Perception: Attention and perception normal.        Mood and Affect: Mood is anxious and depressed. Affect is tearful.        Speech: Speech normal.        Behavior: Behavior normal. Behavior is cooperative.        Thought Content: Thought content normal.        Cognition and Memory: Cognition and memory normal.        Judgment: Judgment normal.    Assessment/Plan:  1. Essential hypertension Stable. Continue bp medication as prescribed   2. Systemic lupus erythematosus, unspecified SLE type, unspecified organ involvement status (HCC) Persistent flare. Will extend FMLA to protect job through 124/2021  3. Depression, major, single episode, moderate (HCC) Start duloxetine 20mg  daily. Written instructions provided to wean off prozac. Reassess prior to return to work.   4. Generalized anxiety disorder May take hydroxyzine 25mg  twice daily as needed for acute anxiety.  - hydrOXYzine (ATARAX/VISTARIL) 25 MG tablet; Take 1 tablet (25 mg total) by mouth 2 (two) times daily as needed.  Dispense: 60 tablet; Refill: 1  5. Primary insomnia Add trazodone 50mg . Take 1 to 2 tablets at bedtime as needed for insomnia and anxiety.  - traZODone (DESYREL) 50 MG tablet; Take 1 to 2 tablets po QHS prn anxiety  Dispense: 60 tablet; Refill: 3   General Counseling: Suad verbalizes  understanding  of the findings of todays visit and agrees with plan of treatment. I have discussed any further diagnostic evaluation that may be needed or ordered today. We also reviewed her medications today. she has been encouraged to call the office with any questions or concerns that should arise related to todays visit.   This patient was seen by [place] with Dr Lavera Guise as a part of collaborative care agreement  Meds ordered this encounter  Medications  . hydrOXYzine (ATARAX/VISTARIL) 25 MG tablet    Sig: Take 1 tablet (25 mg total) by mouth 2 (two) times daily as needed.    Dispense:  60 tablet    Refill:  1    Order Specific Question:   Supervising Provider    Answer:   Lavera Guise [[id number]]  . traZODone (DESYREL) 50 MG tablet    Sig: Take 1 to 2 tablets po QHS prn anxiety    Dispense:  60 tablet    Refill:  3    Order Specific Question:   Supervising Provider    Answer:   Lavera Guise [place]  . DISCONTD: DULoxetine (CYMBALTA) 20 MG capsule    Sig: Take 1 capsule (20 mg total) by mouth daily.    Dispense:  30 capsule    Refill:  3    Instructions provided to wean off fluoxetine    Order Specific Question:   Supervising Provider    Answer:   Lavera Guise [[id number]]    Total time spent: 30 Minutes   Time spent includes review of chart, medications, test results, and follow up plan with the patient.      Dr Lavera Guise Internal medicine

## 2020-09-23 DIAGNOSIS — Z0001 Encounter for general adult medical examination with abnormal findings: Secondary | ICD-10-CM | POA: Insufficient documentation

## 2020-10-19 ENCOUNTER — Other Ambulatory Visit: Payer: Self-pay

## 2020-10-19 ENCOUNTER — Encounter: Payer: Self-pay | Admitting: Nurse Practitioner

## 2020-10-19 ENCOUNTER — Ambulatory Visit (INDEPENDENT_AMBULATORY_CARE_PROVIDER_SITE_OTHER): Payer: BC Managed Care – PPO | Admitting: Nurse Practitioner

## 2020-10-19 VITALS — BP 115/94 | HR 93 | Temp 97.5°F | Resp 16 | Ht 70.0 in | Wt 233.0 lb

## 2020-10-19 DIAGNOSIS — F321 Major depressive disorder, single episode, moderate: Secondary | ICD-10-CM | POA: Diagnosis not present

## 2020-10-19 DIAGNOSIS — I10 Essential (primary) hypertension: Secondary | ICD-10-CM | POA: Diagnosis not present

## 2020-10-19 DIAGNOSIS — M359 Systemic involvement of connective tissue, unspecified: Secondary | ICD-10-CM | POA: Diagnosis not present

## 2020-10-19 DIAGNOSIS — F5101 Primary insomnia: Secondary | ICD-10-CM | POA: Diagnosis not present

## 2020-10-19 MED ORDER — DULOXETINE HCL 30 MG PO CPEP: 30.0000 mg | ORAL_CAPSULE | Freq: Every day | ORAL | 3 refills | Status: DC

## 2020-10-19 NOTE — Progress Notes (Signed)
[facility] [place], [place] [address]  Internal MEDICINE  Office Visit Note  Patient Name: Linda Vargas  [id number]  [id number]  Date of Service: 11/02/2020  Chief Complaint  Patient presents with  . Follow-up    Steal dealing with anxiety and depression, got booster and made her feel bad  . Hypertension    The patient is here for follow up visit.  -suffering severe flare of lupus. Feels very fatigued, hair is starting to fall out. Has sores on her head. She states that getting out of bed costs every ounce of energy she has. Had to send her daughter to go stay with her dad for a few weeks as she has been unable to care for her properly.  -had Pfizer booster vaccine for COVID 19. She states that since she got this vaccine, her symptoms have been much worse. Has had a continuous headache since she got the vaccine.  Her initial vaccines were Moderna. Due to lupus, was recommended she get the [place] booster.  -changed fluoxetine to cymbalta 20mg  daily. States that this seems to help her body aches a bit. Feels a little less depressed. Takes hydroxyzine at night to help improve acute anxiety. Resting and sleeping well. She has completely weaned off fluoxetine and is on just the duloxetine at this time.       Current Medication: Outpatient Encounter Medications as of 10/19/2020  Medication Sig Note  . acetaminophen (TYLENOL) 500 MG tablet Take 1,000 mg by mouth every 8 (eight) hours as needed for headache.    . albuterol (VENTOLIN HFA) 108 (90 Base) MCG/ACT inhaler Inhale 2 puffs into the lungs 3 (three) times daily as needed for wheezing or shortness of breath.   . Azelastine HCl 0.15 % SOLN INL 2 PFS IEN QD   . EPIPEN 2-PAK 0.3 MG/0.3ML SOAJ injection INJECT INTRAMUSCULARLY AS DIRECTED FOR ANAPHYLATIC REACTIONS   . ergocalciferol (DRISDOL) 1.25 MG (50000 UT) capsule Take 1 capsule (50,000 Units total) by mouth once a week.   . hydroxychloroquine (PLAQUENIL)  200 MG tablet Take 200 mg by mouth 2 (two) times daily.   . hydrOXYzine (ATARAX/VISTARIL) 25 MG tablet Take 1 tablet (25 mg total) by mouth 2 (two) times daily as needed.   Marland Kitchen losartan (COZAAR) 50 MG tablet Take 1 tablet (50 mg total) by mouth daily.   . ondansetron (ZOFRAN ODT) 4 MG disintegrating tablet Take 1 tablet (4 mg total) by mouth every 8 (eight) hours as needed for nausea or vomiting.   . traZODone (DESYREL) 50 MG tablet Take 1 to 2 tablets po QHS prn anxiety   . [DISCONTINUED] DULoxetine (CYMBALTA) 20 MG capsule Take 1 capsule (20 mg total) by mouth daily.   . [DISCONTINUED] FLUoxetine (PROZAC) 20 MG tablet Take 1 tablet (20 mg total) by mouth daily. 10/19/2020: patient was changed to duloxetine and weaned off fluoxetine  . [DISCONTINUED] hydrochlorothiazide (HYDRODIURIL) 25 MG tablet Take 1 tablet (25 mg total) by mouth daily.   . DULoxetine (CYMBALTA) 30 MG capsule Take 1 capsule (30 mg total) by mouth daily.    No facility-administered encounter medications on file as of 10/19/2020.    Surgical History: Past Surgical History:  Procedure Laterality Date  . CHOLECYSTECTOMY  2012  . DILATION AND CURETTAGE OF UTERUS  2011  . DILATION AND CURETTAGE OF UTERUS  01/17/2012   Procedure: DILATATION AND CURETTAGE;  Surgeon: Marylynn Pearson, MD;  Location: [facility];  Service: Gynecology;  Laterality: N/A;  Dilatation  and curratage, insertion of Bakri Balloon  . FOOT SURGERY    . HARDWARE REMOVAL Right 02/18/2018   Procedure: HARDWARE REMOVAL RIGHT WRIST;  Surgeon: Hessie Knows, MD;  Location: [facility];  Service: Orthopedics;  Laterality: Right;  . OPEN REDUCTION INTERNAL FIXATION (ORIF) DISTAL RADIAL FRACTURE Right 11/26/2017   Procedure: OPEN REDUCTION INTERNAL FIXATION (ORIF) DISTAL RADIAL FRACTURE;  Surgeon: Hessie Knows, MD;  Location: [facility];  Service: Orthopedics;  Laterality: Right;  . TONSILLECTOMY    . WRIST SURGERY Right 02/18/2018    Medical History: Past Medical History:   Diagnosis Date  . Anxiety   . Connective tissue disorder (HCC)    undifferentiated  . Fibromyalgia   . Hypertension   . Lupus ([place])   . Migraine   . Seasonal allergic rhinitis     Family History: Family History  Problem Relation Age of Onset  . Hypertension Mother   . Hypertension Father   . Diabetes Father   . Heart disease Maternal Grandmother   . Heart disease Paternal Grandmother   . Anesthesia problems Neg Hx     Social History   Socioeconomic History  . Marital status: Single    Spouse name: Not on file  . Number of children: Not on file  . Years of education: Not on file  . Highest education level: Not on file  Occupational History  . Not on file  Tobacco Use  . Smoking status: Never Smoker  . Smokeless tobacco: Never Used  Vaping Use  . Vaping Use: Never used  Substance and Sexual Activity  . Alcohol use: No  . Drug use: No  . Sexual activity: Not on file  Other Topics Concern  . Not on file  Social History Narrative   Lives at home.   Social Determinants of Health   Financial Resource Strain: Not on file  Food Insecurity: Not on file  Transportation Needs: Not on file  Physical Activity: Not on file  Stress: Not on file  Social Connections: Not on file  Intimate Partner Violence: Not on file      Review of Systems  Constitutional: Positive for activity change, appetite change and fatigue. Negative for chills and unexpected weight change.       The patient is not participating in activities she would normally find enjoyable.   HENT: Negative for congestion, postnasal drip, rhinorrhea, sneezing and sore throat.   Respiratory: Negative for cough, chest tightness and shortness of breath.   Cardiovascular: Negative for chest pain and palpitations.  Gastrointestinal: Positive for nausea. Negative for abdominal pain, constipation, diarrhea and vomiting.  Endocrine: Negative for cold intolerance, heat intolerance, polydipsia and polyuria.   Musculoskeletal: Positive for arthralgias and myalgias. Negative for back pain, joint swelling and neck pain.       Probable lupus flare.   Skin: Negative for rash.  Allergic/Immunologic: Positive for environmental allergies.  Neurological: Positive for headaches. Negative for tremors and numbness.  Hematological: Negative for adenopathy. Does not bruise/bleed easily.  Psychiatric/Behavioral: Positive for dysphoric mood and sleep disturbance. Negative for behavioral problems (Depression) and suicidal ideas. The patient is nervous/anxious.     Today's Vitals   10/19/20 0956  BP: (!) 115/94  Pulse: 93  Resp: 16  Temp: (!) 97.5 F (36.4 C)  SpO2: 99%  Weight: 233 lb (105.7 kg)  Height: 5\' 10"  (1.778 m)   Body mass index is 33.43 kg/m.  Physical Exam Vitals and nursing note reviewed.  Constitutional:      General:  She is not in acute distress.    Appearance: Normal appearance. She is well-developed. She is not diaphoretic.  HENT:     Head: Normocephalic and atraumatic.     Nose: Nose normal.     Mouth/Throat:     Pharynx: No oropharyngeal exudate.  Eyes:     Pupils: Pupils are equal, round, and reactive to light.  Neck:     Thyroid: No thyromegaly.     Vascular: No carotid bruit or JVD.     Trachea: No tracheal deviation.  Cardiovascular:     Rate and Rhythm: Normal rate and regular rhythm.     Heart sounds: Normal heart sounds. No murmur heard. No friction rub. No gallop.   Pulmonary:     Effort: Pulmonary effort is normal. No respiratory distress.     Breath sounds: Normal breath sounds. No wheezing or rales.  Chest:     Chest wall: No tenderness.  Abdominal:     Palpations: Abdomen is soft.  Musculoskeletal:        General: Normal range of motion.     Cervical back: Normal range of motion and neck supple.     Comments: Generalized joint pain with point tenderness present.   Lymphadenopathy:     Cervical: No cervical adenopathy.  Skin:    General: Skin is warm  and dry.  Neurological:     General: No focal deficit present.     Mental Status: She is alert and oriented to person, place, and time.     Cranial Nerves: No cranial nerve deficit.  Psychiatric:        Attention and Perception: Attention and perception normal.        Mood and Affect: Mood is anxious and depressed. Affect is tearful.        Speech: Speech normal.        Behavior: Behavior normal. Behavior is cooperative.        Thought Content: Thought content normal.        Cognition and Memory: Cognition and memory normal.        Judgment: Judgment normal.    Assessment/Plan: 1. Essential hypertension Stable. Continue bp medication as prescribed   2. Undifferentiated connective tissue disease ([place]) Increase cymbalta to 30mg  daily. Recommend the patient see her rheumatologist provider.  - DULoxetine (CYMBALTA) 30 MG capsule; Take 1 capsule (30 mg total) by mouth daily.  Dispense: 30 capsule; Refill: 3  3. Depression, major, single episode, moderate (HCC) Increase cymbalta to 30mg  daily. - DULoxetine (CYMBALTA) 30 MG capsule; Take 1 capsule (30 mg total) by mouth daily.  Dispense: 30 capsule; Refill: 3  4. Primary insomnia Patient should continue trazodone as needed and as prescribed.   General Counseling: Dayra verbalizes understanding of the findings of todays visit and agrees with plan of treatment. I have discussed any further diagnostic evaluation that may be needed or ordered today. We also reviewed her medications today. she has been encouraged to call the office with any questions or concerns that should arise related to todays visit.   This patient was seen by [place] with Dr Lavera Guise as a part of collaborative care agreement  Meds ordered this encounter  Medications  . DULoxetine (CYMBALTA) 30 MG capsule    Sig: Take 1 capsule (30 mg total) by mouth daily.    Dispense:  30 capsule    Refill:  3    Please note increased dose duloxetine     Order Specific  Question:   Supervising Provider    Answer:   Lavera Guise [id number]    Total time spent: 30 Minutes   Time spent includes review of chart, medications, test results, and follow up plan with the patient.      Dr Lavera Guise Internal medicine

## 2020-10-24 ENCOUNTER — Other Ambulatory Visit: Payer: Self-pay

## 2020-10-24 DIAGNOSIS — I10 Essential (primary) hypertension: Secondary | ICD-10-CM

## 2020-10-24 MED ORDER — HYDROCHLOROTHIAZIDE 25 MG PO TABS: 25.0000 mg | ORAL_TABLET | Freq: Every day | ORAL | 5 refills | Status: DC

## 2020-10-29 ENCOUNTER — Ambulatory Visit: Payer: BC Managed Care – PPO | Admitting: Hospice and Palliative Medicine

## 2020-11-08 ENCOUNTER — Other Ambulatory Visit: Payer: Self-pay

## 2020-11-08 ENCOUNTER — Emergency Department: Payer: BC Managed Care – PPO

## 2020-11-08 ENCOUNTER — Encounter: Payer: Self-pay | Admitting: Emergency Medicine

## 2020-11-08 ENCOUNTER — Emergency Department
Admission: EM | Admit: 2020-11-08 | Discharge: 2020-11-08 | Disposition: A | Discharge status: Home / Self Care | Payer: BC Managed Care – PPO | Attending: Emergency Medicine | Admitting: Emergency Medicine

## 2020-11-08 DIAGNOSIS — M791 Myalgia, unspecified site: Secondary | ICD-10-CM | POA: Insufficient documentation

## 2020-11-08 DIAGNOSIS — Z91010 Allergy to peanuts: Secondary | ICD-10-CM | POA: Insufficient documentation

## 2020-11-08 DIAGNOSIS — Z79899 Other long term (current) drug therapy: Secondary | ICD-10-CM | POA: Diagnosis not present

## 2020-11-08 DIAGNOSIS — I10 Essential (primary) hypertension: Secondary | ICD-10-CM | POA: Diagnosis not present

## 2020-11-08 DIAGNOSIS — E876 Hypokalemia: Secondary | ICD-10-CM | POA: Diagnosis not present

## 2020-11-08 DIAGNOSIS — Z20822 Contact with and (suspected) exposure to covid-19: Secondary | ICD-10-CM | POA: Insufficient documentation

## 2020-11-08 DIAGNOSIS — M25511 Pain in right shoulder: Secondary | ICD-10-CM | POA: Diagnosis present

## 2020-11-08 LAB — COMPREHENSIVE METABOLIC PANEL
ALT: 22 U/L (ref 0–44)
AST: 23 U/L (ref 15–41)
Albumin: 3.7 g/dL (ref 3.5–5.0)
Alkaline Phosphatase: 73 U/L (ref 38–126)
Anion gap: 11 (ref 5–15)
BUN: 12 mg/dL (ref 6–20)
CO2: 27 mmol/L (ref 22–32)
Calcium: 8.9 mg/dL (ref 8.9–10.3)
Chloride: 101 mmol/L (ref 98–111)
Creatinine, Ser: 1.09 mg/dL — ABNORMAL HIGH (ref 0.44–1.00)
GFR, Estimated: >60 mL/min (ref >60)
Glucose, Bld: 83 mg/dL (ref 70–99)
Potassium: 2.8 mmol/L — ABNORMAL LOW (ref 3.5–5.1)
Sodium: 139 mmol/L (ref 135–145)
Total Bilirubin: 0.5 mg/dL (ref 0.3–1.2)
Total Protein: 7.4 g/dL (ref 6.5–8.1)

## 2020-11-08 LAB — CBC WITH DIFFERENTIAL/PLATELET
Abs Immature Granulocytes: 0.02 10*3/uL (ref 0.00–0.07)
Basophils Absolute: 0.1 10*3/uL (ref 0.0–0.1)
Basophils Relative: 1 %
Eosinophils Absolute: 0.2 10*3/uL (ref 0.0–0.5)
Eosinophils Relative: 2 %
HCT: 39 % (ref 36.0–46.0)
Hemoglobin: 12.9 g/dL (ref 12.0–15.0)
Immature Granulocytes: 0 %
Lymphocytes Relative: 38 %
Lymphs Abs: 2.5 10*3/uL (ref 0.7–4.0)
MCH: 28 pg (ref 26.0–34.0)
MCHC: 33.1 g/dL (ref 30.0–36.0)
MCV: 84.8 fL (ref 80.0–100.0)
Monocytes Absolute: 0.6 10*3/uL (ref 0.1–1.0)
Monocytes Relative: 9 %
Neutro Abs: 3.2 10*3/uL (ref 1.7–7.7)
Neutrophils Relative %: 50 %
Platelets: 400 10*3/uL (ref 150–400)
RBC: 4.6 MIL/uL (ref 3.87–5.11)
RDW: 12.5 % (ref 11.5–15.5)
WBC: 6.6 10*3/uL (ref 4.0–10.5)
nRBC: 0 % (ref 0.0–0.2)

## 2020-11-08 LAB — TROPONIN I (HIGH SENSITIVITY): Troponin I (High Sensitivity): 4 ng/L (ref <18)

## 2020-11-08 LAB — SEDIMENTATION RATE: Sed Rate: 13 mm/hr (ref 0–20)

## 2020-11-08 LAB — LIPASE, BLOOD: Lipase: 33 U/L (ref 11–51)

## 2020-11-08 MED ORDER — ONDANSETRON 4 MG PO TBDP: 4.0000 mg | ORAL_TABLET | Freq: Three times a day (TID) | ORAL | 0 refills | Status: DC | PRN

## 2020-11-08 MED ORDER — ONDANSETRON 4 MG PO TBDP
ORAL_TABLET | ORAL | Status: AC
  Filled 2020-11-08: qty 1

## 2020-11-08 MED ORDER — ONDANSETRON 4 MG PO TBDP
4.0000 mg | ORAL_TABLET | Freq: Once | ORAL | Status: AC
  Administered 2020-11-08: 4 mg via ORAL

## 2020-11-08 MED ORDER — POTASSIUM CHLORIDE ER 10 MEQ PO TBCR: 10.0000 meq | EXTENDED_RELEASE_TABLET | Freq: Every day | ORAL | 0 refills | Status: DC

## 2020-11-08 NOTE — ED Notes (Signed)
Manuela Schwartz, PA at bedside with patient in triage.

## 2020-11-08 NOTE — ED Triage Notes (Signed)
Pt comes into the ED via POV c/o right chest pain that started yesterday as a reflux pain but now is trending to the right arm and accompanied with nausea.  Pt also admit she had some discomfort in her neck.  Pt also states she had some dizziness and SHOB.  Pt currently has even and unlabored respirations and is in NAD.  Pt has h/o lupus and per the patient she feels like she is having a flare up in addition to this.

## 2020-11-08 NOTE — ED Provider Notes (Signed)
MSE was initiated and I personally evaluated the patient and placed orders (if any) at  11:40 AM on November 08, 2020.  Patient c/o cp and sob, concerns of lupus flare,  Vaccinated for covid, had booster 2-3 weeks ago, states she feels nauseated  The patient appears stable so that the remainder of the MSE may be completed by another provider. Encouraged to stay as she needs further worker and evaluation   Versie Starks, PA-C 11/08/20 1143    Merlyn Lot, MD 11/08/20 [phone]

## 2020-11-08 NOTE — ED Provider Notes (Signed)
[facility] Emergency Department Provider Note  ____________________________________________   Event Date/Time   First MD Initiated Contact with Patient 11/08/20 1346     (approximate)  I have reviewed the triage vital signs and the nursing notes.   HISTORY  Chief Complaint Chest Pain    HPI Linda Vargas is a 45 y.o. female with anxiety, fibromyalgia who comes in for chest pain.  Patient reports having chest pain that started yesterday.  The pain is actually more in her right shoulder and radiates up into her neck and a little bit into her chest wall.  Her symptoms are moderate, constant, worse with movement, better at rest.  She stopped taking anything to help with the pain.  She has had some associated nausea and hot flashes with that she denies being around anybody that has Covid.  Is vaccinated.          Past Medical History:  Diagnosis Date  . Anxiety   . Connective tissue disorder (HCC)    undifferentiated  . Fibromyalgia   . Hypertension   . Lupus ([place])   . Migraine   . Seasonal allergic rhinitis     Patient Active Problem List   Diagnosis Date Noted  . Encounter for general adult medical examination with abnormal findings 09/23/2020  . Acute otitis externa of both ears 04/05/2020  . Primary insomnia 01/11/2020  . Urinary tract infection with hematuria 09/10/2019  . Dysuria 09/10/2019  . Vitamin D deficiency 04/11/2019  . Vitamin B12 deficiency 04/03/2019  . Cough 12/12/2018  . Fever and chills 10/20/2018  . Acute upper respiratory infection 10/20/2018  . Fatigue 10/20/2018  . Acute non-recurrent sinusitis 06/09/2018  . Essential hypertension 04/06/2018  . Generalized anxiety disorder 04/06/2018  . Depression, major, single episode, moderate ([place]) 04/06/2018  . Depression 01/21/2017  . Contusion of right foot 01/19/2017  . Intractable migraine with aura without status migrainosus 12/12/2016  . TIA (transient ischemic  attack) 10/12/2015  . Long-term use of Plaquenil 07/21/2014  . Fibromyalgia 11/12/2012  . 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 Pregnancy, supervision of, high-risk 06/27/2011  . Systemic lupus erythematosus ([place]) 06/27/2011  . 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 Vaginal bleeding in pregnancy 06/27/2011  . Vaginal lesion 06/27/2011    Past Surgical History:  Procedure Laterality Date  . CHOLECYSTECTOMY  2012  . DILATION AND CURETTAGE OF UTERUS  2011  . DILATION AND CURETTAGE OF UTERUS  01/17/2012   Procedure: DILATATION AND CURETTAGE;  Surgeon: Marylynn Pearson, MD;  Location: [facility];  Service: Gynecology;  Laterality: N/A;  Dilatation and curratage, insertion of Bakri Balloon  . FOOT SURGERY    . HARDWARE REMOVAL Right 02/18/2018   Procedure: HARDWARE REMOVAL RIGHT WRIST;  Surgeon: Hessie Knows, MD;  Location: [facility];  Service: Orthopedics;  Laterality: Right;  . OPEN REDUCTION INTERNAL FIXATION (ORIF) DISTAL RADIAL FRACTURE Right 11/26/2017   Procedure: OPEN REDUCTION INTERNAL FIXATION (ORIF) DISTAL RADIAL FRACTURE;  Surgeon: Hessie Knows, MD;  Location: [facility];  Service: Orthopedics;  Laterality: Right;  . TONSILLECTOMY    . WRIST SURGERY Right 02/18/2018    Prior to Admission medications   Medication Sig Start Date End Date Taking? Authorizing Provider  acetaminophen (TYLENOL) 500 MG tablet Take 1,000 mg by mouth every 8 (eight) hours as needed for headache.     [provider]  albuterol (VENTOLIN HFA) 108 (90 Base) MCG/ACT inhaler Inhale 2 puffs into the lungs 3 (three) times daily as needed for  wheezing or shortness of breath. 06/08/20   Lavera Guise, MD  Azelastine HCl 0.15 % SOLN INL 2 PFS IEN QD 02/15/18   [provider]  DULoxetine (CYMBALTA) 30 MG capsule Take 1 capsule (30 mg total) by mouth daily. 10/19/20   Ronnell Freshwater, NP  EPIPEN 2-PAK 0.3 MG/0.3ML SOAJ injection INJECT INTRAMUSCULARLY AS DIRECTED FOR ANAPHYLATIC REACTIONS  11/30/15   [provider]  ergocalciferol (DRISDOL) 1.25 MG (50000 UT) capsule Take 1 capsule (50,000 Units total) by mouth once a week. 04/11/19   Ronnell Freshwater, NP  hydrochlorothiazide (HYDRODIURIL) 25 MG tablet Take 1 tablet (25 mg total) by mouth daily. 10/24/20   Lavera Guise, MD  hydroxychloroquine (PLAQUENIL) 200 MG tablet Take 200 mg by mouth 2 (two) times daily.    [provider]  hydrOXYzine (ATARAX/VISTARIL) 25 MG tablet Take 1 tablet (25 mg total) by mouth 2 (two) times daily as needed. 09/21/20   Ronnell Freshwater, NP  losartan (COZAAR) 50 MG tablet Take 1 tablet (50 mg total) by mouth daily. 08/28/20   Ronnell Freshwater, NP  ondansetron (ZOFRAN ODT) 4 MG disintegrating tablet Take 1 tablet (4 mg total) by mouth every 8 (eight) hours as needed for nausea or vomiting. 08/10/20   Luiz Ochoa, NP  traZODone (DESYREL) 50 MG tablet Take 1 to 2 tablets po QHS prn anxiety 09/21/20   Ronnell Freshwater, NP    Allergies Ibuprofen, Bean pod extract, Corn-containing products, and Peanut-containing drug products  Family History  Problem Relation Age of Onset  . Hypertension Mother   . Hypertension Father   . Diabetes Father   . Heart disease Maternal Grandmother   . Heart disease Paternal Grandmother   . Anesthesia problems Neg Hx     Social History Social History   Tobacco Use  . Smoking status: Never Smoker  . Smokeless tobacco: Never Used  Vaping Use  . Vaping Use: Never used  Substance Use Topics  . Alcohol use: No  . Drug use: No      Review of Systems Constitutional: No fever/chills Eyes: No visual changes. ENT: No sore throat. Cardiovascular: Positive chest pain Respiratory: Denies shortness of breath. Gastrointestinal: No abdominal pain.  No nausea, no vomiting.  No diarrhea.  No constipation. Genitourinary: Negative for dysuria. Musculoskeletal: Negative for back pain.  Right shoulder pain Skin: Negative for rash. Neurological:  Negative for headaches, focal weakness or numbness. All other ROS negative ____________________________________________   PHYSICAL EXAM:  VITAL SIGNS: ED Triage Vitals  Enc Vitals Group     BP 11/08/20 1139 136/90     Pulse Rate 11/08/20 1139 80     Resp 11/08/20 1139 17     Temp 11/08/20 1139 98.3 F (36.8 C)     Temp Source 11/08/20 1139 Oral     SpO2 11/08/20 1139 99 %     Weight 11/08/20 1140 232 lb (105.2 kg)     Height 11/08/20 1140 5\' 10"  (1.778 m)     Head Circumference --      Peak Flow --      Pain Score 11/08/20 1140 6     Pain Loc --      Pain Edu? --      Excl. in [place]? --     Constitutional: Alert and oriented. Well appearing and in no acute distress. Eyes: Conjunctivae are normal. EOMI. Head: Atraumatic. Nose: No congestion/rhinnorhea. Mouth/Throat: Mucous membranes are moist.   Neck: No stridor. Trachea Midline.  FROM Cardiovascular: Normal rate, regular rhythm. Grossly normal heart sounds.  Good peripheral circulation. Respiratory: Normal respiratory effort.  No retractions. Lungs CTAB. Gastrointestinal: Soft and nontender. No distention. No abdominal bruits.  Musculoskeletal: Right shoulder tenderness when trying to move it.  Also point tenderness along the shoulder and up into the neck along the trapezius muscle.  2+ distal pulses both radial and DP Neurologic:  Normal speech and language. No gross focal neurologic deficits are appreciated.  Skin:  Skin is warm, dry and intact. No rash noted. Psychiatric: Mood and affect are normal. Speech and behavior are normal. GU: Deferred   ____________________________________________   LABS (all labs ordered are listed, but only abnormal results are displayed)  Labs Reviewed  COMPREHENSIVE METABOLIC PANEL - Abnormal; Notable for the following components:      Result Value   Potassium 2.8 (*)    Creatinine, Ser 1.09 (*)    All other components within normal limits  SARS CORONAVIRUS 2 (TAT 6-24 HRS)  CBC WITH  DIFFERENTIAL/PLATELET  SEDIMENTATION RATE  LIPASE, BLOOD  TROPONIN I (HIGH SENSITIVITY)  TROPONIN I (HIGH SENSITIVITY)   ____________________________________________   ED ECG REPORT I, Vanessa Kenefic, the attending physician, personally viewed and interpreted this ECG.  Normal sinus rate of 84, no st elevation, no twi, normal intervals  ____________________________________________  RADIOLOGY Robert Bellow, personally viewed and evaluated these images (plain radiographs) as part of my medical decision making, as well as reviewing the written report by the radiologist.  ED MD interpretation: No pneumonia or pneumothorax  Official radiology report(s): DG Chest 2 View  Result Date: 11/08/2020 CLINICAL DATA:  Chest pain. EXAM: CHEST - 2 VIEW COMPARISON:  09/10/2020. FINDINGS: Mediastinum hilar structures normal. Heart size normal. No focal infiltrate. No pleural effusion or pneumothorax. Thoracic spine scoliosis concave left again noted. Surgical clips upper abdomen. IMPRESSION: No acute cardiopulmonary disease. Electronically Signed   By: Marcello Moores  Register   On: 11/08/2020 12:33    ____________________________________________   PROCEDURES  Procedure(s) performed (including Critical Care):  Procedures   ____________________________________________   INITIAL IMPRESSION / ASSESSMENT AND PLAN / ED COURSE   Linda Vargas was evaluated in Emergency Department on 11/08/2020 for the symptoms described in the history of present illness. She was evaluated in the context of the global COVID-19 pandemic, which necessitated consideration that the patient might be at risk for infection with the SARS-CoV-2 virus that causes COVID-19. Institutional protocols and algorithms that pertain to the evaluation of patients at risk for COVID-19 are in a state of rapid change based on information released by regulatory bodies including the CDC and federal and state organizations. These policies and  algorithms were followed during the patient's care in the ED.    Most Likely DDx:  -MSK (atypical chest pain) but will get cardiac markers to evaluate for ACS given risk factors/age.  Patient's pain does situate in a trapezius muscle fashion.  Is point tender when I press on it as well as pain with movement of it.  I suspect that this is more musculoskeletal in nature.  Given her pain is been going on for greater than 3 hours will only 1 cardiac marker.  \ DDx that was also considered d/t potential to cause harm, but was found less likely based on history and physical (as detailed above): -PNA (no fevers, cough but CXR to evaluate) -PNX (reassured with equal b/l breath sounds, CXR to evaluate) -Symptomatic anemia (will get H&H) -Pulmonary embolism as no sob at  rest,in nature, no hypoxia.  PERC negative -Aortic Dissection as no tearing pain and no radiation to the mid back, pulses equal -Pericarditis no rub on exam, EKG changes or hx to suggest dx -Tamponade (no notable SOB, tachycardic, hypotensive) -Esophageal rupture (no h/o diffuse vomitting/no crepitus)  Discussed with patient her reassuring work-up including negative cardiac marker.  We discussed CT imaging but this time I have such a low suspicion given she is PERC negative although history of lupus would put her at slightly increased risk but again her pain is reproducible on exam.  It seems to follow the trapezius muscle.  We discussed that if her pain is not getting better that she should come back or if she develops shortness of breath that is worsening with the pain.  She expressed understanding.  This time I suspect is more likely musculoskeletal.  I did prescribe her some Zofran to help with nausea.  Patient felt comfortable with this plan.  We did also test her for Covid because I have seen some COVID causing some chest pain.  Her potassium was low and we have repleted that as well.        ____________________________________________   FINAL CLINICAL IMPRESSION(S) / ED DIAGNOSES   Final diagnoses:  Hypokalemia  Muscle ache     MEDICATIONS GIVEN DURING THIS VISIT:  Medications  ondansetron (ZOFRAN-ODT) 4 MG disintegrating tablet (  Not Given 11/08/20 1222)  ondansetron (ZOFRAN-ODT) disintegrating tablet 4 mg (4 mg Oral Given 11/08/20 1144)     ED Discharge Orders         Ordered    ondansetron (ZOFRAN ODT) 4 MG disintegrating tablet  Every 8 hours PRN        11/08/20 1411    potassium chloride (KLOR-CON) 10 MEQ tablet  Daily        11/08/20 1411           Note:  This document was prepared using Dragon voice recognition software and may include unintentional dictation errors.   Vanessa Milo, MD 11/08/20 1450

## 2020-11-08 NOTE — Discharge Instructions (Signed)
Your covid results are pending.  Follow up in Mychart for these results.   Stay quarentine until your results are back. Take tylenol 1g every 8 hours   Return to ER for worsening SOB, unable to keep food down, or any other concerns.    I suspect that the arm pain is more related to a muscle.

## 2020-11-09 LAB — SARS CORONAVIRUS 2 (TAT 6-24 HRS): SARS Coronavirus 2: NEGATIVE

## 2020-12-17 ENCOUNTER — Ambulatory Visit: Payer: BC Managed Care – PPO | Admitting: Hospice and Palliative Medicine

## 2021-03-27 ENCOUNTER — Other Ambulatory Visit: Payer: Self-pay

## 2021-03-27 ENCOUNTER — Encounter: Payer: Self-pay | Admitting: Nurse Practitioner

## 2021-03-27 ENCOUNTER — Ambulatory Visit: Payer: BC Managed Care – PPO | Admitting: Nurse Practitioner

## 2021-03-27 VITALS — BP 119/78 | HR 81 | Temp 98.0°F | Ht 70.0 in | Wt 241.2 lb

## 2021-03-27 DIAGNOSIS — I10 Essential (primary) hypertension: Secondary | ICD-10-CM

## 2021-03-27 DIAGNOSIS — F321 Major depressive disorder, single episode, moderate: Secondary | ICD-10-CM | POA: Diagnosis not present

## 2021-03-27 DIAGNOSIS — R21 Rash and other nonspecific skin eruption: Secondary | ICD-10-CM | POA: Diagnosis not present

## 2021-03-27 DIAGNOSIS — F411 Generalized anxiety disorder: Secondary | ICD-10-CM

## 2021-03-27 DIAGNOSIS — M359 Systemic involvement of connective tissue, unspecified: Secondary | ICD-10-CM

## 2021-03-27 DIAGNOSIS — E876 Hypokalemia: Secondary | ICD-10-CM

## 2021-03-27 DIAGNOSIS — Z7689 Persons encountering health services in other specified circumstances: Secondary | ICD-10-CM

## 2021-03-27 MED ORDER — HYDROXYZINE HCL 25 MG PO TABS: 25.0000 mg | ORAL_TABLET | Freq: Three times a day (TID) | ORAL | 1 refills | Status: DC | PRN

## 2021-03-27 MED ORDER — DULOXETINE HCL 30 MG PO CPEP: 30.0000 mg | ORAL_CAPSULE | Freq: Two times a day (BID) | ORAL | 3 refills | Status: DC

## 2021-03-27 MED ORDER — CLOBETASOL PROPIONATE 0.05 % EX CREA: 1.0000 "application " | TOPICAL_CREAM | Freq: Two times a day (BID) | CUTANEOUS | 1 refills | Status: DC

## 2021-03-27 NOTE — Progress Notes (Signed)
New Patient Office Visit  Subjective:  Patient ID: Linda Vargas, female    DOB: Jul 16, 1976  Age: 45 y.o. MRN: [id number]  CC:  Chief Complaint  Patient presents with   New Patient (Initial Visit)    HPI YARETHZY CROAK presents to establish new primary care office. She has changed office to stay with prior pcp and maintain continuity of care. She states that she has been having trouble with hives. These are most severe at night. She states that she wakes up clawing at her skin. These first started about 1.5 months ago. She could not think of anything she may be doing differently. 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 Was using this every other day. Hives were showing up every night. She states that the hives appear as red patches of raised skin which are extremely itchy. They are happening all over her body, even her face. They come up on her eyelids and lips. One day, they were so severe that she did go to urgent care. She states that she was treated with a medrol taper. This did help a little. Symptoms are still occurring every night. She states that every night she is going to bed with hives. They are even present on the palms of her hands and soles of her feet. These symptoms do not bother her at all during the day. There are some days that she wakes up and still has the hives. She states that by the time she goes to work, the hives are gone. She states that over time, she has made some changes to her diet. She has been eating more spinach and salmon and though this may be contributing the the presence of hives. She has cut down on both of these foods. Thus far she has not noted any improvement. She feels as though there may be some sort of anxiety component to this. She is currently on duloxetine 30mg  every evening. She states that taking hydroxyzine helps with the itching and helps her to get to sleep to rest better. She denies headaches, wheezing, cough,  congestion, or shortness of breath. She states that when she did go to urgent Care, they checked some basic labs. Her BMP showed that her potassium was a little low. They started her on a daily dose of potassium at 70mEq. She continues to take this every day. She does have an allergist. She has been unable to get and appointment with that provider as of yet. She states that she last had allergies checked through blood work last year. Nothing unexpected was noted.   Past Medical History:  Diagnosis Date   Anxiety    Connective tissue disorder ([place])    undifferentiated   Fibromyalgia    Hypertension    Lupus ([place])    Migraine    Seasonal allergic rhinitis     Past Surgical History:  Procedure Laterality Date   CHOLECYSTECTOMY  2012   DILATION AND CURETTAGE OF UTERUS  2011   DILATION AND CURETTAGE OF UTERUS  01/17/2012   Procedure: DILATATION AND CURETTAGE;  Surgeon: Marylynn Pearson, MD;  Location: [facility];  Service: Gynecology;  Laterality: N/A;  Dilatation and curratage, insertion of Bakri Balloon   FOOT SURGERY     HARDWARE REMOVAL Right 02/18/2018   Procedure: HARDWARE REMOVAL RIGHT WRIST;  Surgeon: Hessie Knows, MD;  Location: [facility];  Service: Orthopedics;  Laterality: Right;   OPEN REDUCTION INTERNAL FIXATION (ORIF) DISTAL RADIAL  FRACTURE Right 11/26/2017   Procedure: OPEN REDUCTION INTERNAL FIXATION (ORIF) DISTAL RADIAL FRACTURE;  Surgeon: Hessie Knows, MD;  Location: [facility];  Service: Orthopedics;  Laterality: Right;   TONSILLECTOMY     WRIST SURGERY Right 02/18/2018    Family History  Problem Relation Age of Onset   Hypertension Mother    Hypertension Father    Diabetes Father    Heart disease Maternal Grandmother    Heart disease Paternal Grandmother    Anesthesia problems Neg Hx     Social History   Socioeconomic History   Marital status: Single    Spouse name: Not on file   Number of children: Not on file   Years of education: Not on file   Highest education  level: Not on file  Occupational History   Not on file  Tobacco Use   Smoking status: Never   Smokeless tobacco: Never  Vaping Use   Vaping Use: Never used  Substance and Sexual Activity   Alcohol use: No   Drug use: No   Sexual activity: Yes    Birth control/protection: I.U.D.  Other Topics Concern   Not on file  Social History Narrative   Lives at home.   Social Determinants of Health   Financial Resource Strain: Not on file  Food Insecurity: Not on file  Transportation Needs: Not on file  Physical Activity: Not on file  Stress: Not on file  Social Connections: Not on file  Intimate Partner Violence: Not on file    ROS Review of Systems  Constitutional:  Positive for fatigue. Negative for activity change, appetite change, chills and fever.  HENT:  Negative for congestion, postnasal drip, rhinorrhea, sinus pressure, sinus pain, sore throat and trouble swallowing.   Eyes: Negative.   Respiratory:  Negative for cough, chest tightness, shortness of breath and wheezing.   Cardiovascular:  Negative for chest pain and palpitations.  Gastrointestinal:  Negative for constipation, diarrhea, nausea and vomiting.  Endocrine: Negative for cold intolerance, heat intolerance, polydipsia and polyuria.  Genitourinary: Negative.   Musculoskeletal:  Negative for back pain and myalgias.  Skin:  Positive for rash.       Having hives every night over ost of the body. These are occurring on her face, plams of her hands, and soles of her feet.   Allergic/Immunologic: Positive for environmental allergies.  Neurological:  Negative for dizziness, weakness and headaches.  Hematological: Negative.   Psychiatric/Behavioral:  Positive for dysphoric mood. The patient is nervous/anxious.    Objective:   Today's Vitals   03/27/21 1359  BP: 119/78  Pulse: 81  Temp: 98 F (36.7 C)  SpO2: 100%  Weight: 241 lb 3.2 oz (109.4 kg)  Height: 5\' 10"  (1.778 m)   Body mass index is 34.61 kg/m.    Physical Exam Vitals and nursing note reviewed.  Constitutional:      Appearance: Normal appearance. She is well-developed. She is obese.  HENT:     Head: Normocephalic.     Right Ear: Ear canal and external ear normal.     Left Ear: Ear canal and external ear normal.     Nose: Nose normal.     Mouth/Throat:     Mouth: Mucous membranes are moist.     Pharynx: Oropharynx is clear.  Eyes:     Pupils: Pupils are equal, round, and reactive to light.  Cardiovascular:     Rate and Rhythm: Normal rate and regular rhythm.     Pulses: Normal pulses.  Heart sounds: Normal heart sounds.  Pulmonary:     Effort: Pulmonary effort is normal.     Breath sounds: Normal breath sounds. No stridor. No wheezing, rhonchi or rales.  Abdominal:     Palpations: Abdomen is soft.  Musculoskeletal:        General: Normal range of motion.     Cervical back: Normal range of motion and neck supple.  Lymphadenopathy:     Cervical: No cervical adenopathy.  Skin:    General: Skin is warm and dry.     Capillary Refill: Capillary refill takes less than 2 seconds.     Comments: There is evidence of scabs and scars where hives were once present. Currently no active hives or rash are noted.   Neurological:     General: No focal deficit present.     Mental Status: She is alert and oriented to person, place, and time.  Psychiatric:        Attention and Perception: Attention and perception normal.        Mood and Affect: Mood is anxious.        Speech: Speech normal.        Behavior: Behavior normal. Behavior is cooperative.        Thought Content: Thought content normal.        Cognition and Memory: Memory normal.        Judgment: Judgment normal.    Assessment & Plan:  1. Encounter to establish care Appointment today to establish care with prior PCP in new primary care office to maintain continuity of care.   2. Rash and nonspecific skin eruption Add clobetasol cream twice daily as needed for rash,  itching and irritation.  - clobetasol cream (TEMOVATE) 0.05 %; Apply 1 application topically 2 (two) times daily.  Dispense: 60 g; Refill: 1  3. Essential hypertension Stable. Continue blood pressure medication as prescribed   4. Undifferentiated connective tissue disease (HCC) Increased dose of duloxetine 30mg  to twice daily. She should continue counseling services as scheduled.  - DULoxetine (CYMBALTA) 30 MG capsule; Take 1 capsule (30 mg total) by mouth 2 (two) times daily.  Dispense: 60 capsule; Refill: 3  5. Depression, major, single episode, moderate (HCC) Increase dluloxetine to twice daily. Continue counesling appointments as scheduled.  - DULoxetine (CYMBALTA) 30 MG capsule; Take 1 capsule (30 mg total) by mouth 2 (two) times daily.  Dispense: 60 capsule; Refill: 3  6. Generalized anxiety disorder Increase hydroxyzine 20mg  up to three times daily as needed for acute anxiety and itchiness.  - hydrOXYzine (ATARAX/VISTARIL) 25 MG tablet; Take 1 tablet (25 mg total) by mouth every 8 (eight) hours as needed for anxiety.  Dispense: 90 tablet; Refill: 1  7. Hypokalemia Recheck BMP and adjust dosing of HCTZ as indicated.  - Basic Metabolic Panel (BMET); Future   Problem List Items Addressed This Visit       Cardiovascular and Mediastinum   Essential hypertension     Musculoskeletal and Integument   Rash and nonspecific skin eruption   Relevant Medications   clobetasol cream (TEMOVATE) 0.05 %     Other   Generalized anxiety disorder   Relevant Medications   DULoxetine (CYMBALTA) 30 MG capsule   hydrOXYzine (ATARAX/VISTARIL) 25 MG tablet   Depression, major, single episode, moderate (HCC)   Relevant Medications   DULoxetine (CYMBALTA) 30 MG capsule   hydrOXYzine (ATARAX/VISTARIL) 25 MG tablet   Undifferentiated connective tissue disease (HCC)   Relevant Medications   DULoxetine (CYMBALTA) 30  MG capsule   Encounter to establish care - Primary   Hypokalemia   Relevant  Orders   Basic Metabolic Panel (BMET)    Outpatient Encounter Medications as of 03/27/2021  Medication Sig   acetaminophen (TYLENOL) 500 MG tablet Take 1,000 mg by mouth every 8 (eight) hours as needed for headache.    albuterol (VENTOLIN HFA) 108 (90 Base) MCG/ACT inhaler Inhale 2 puffs into the lungs 3 (three) times daily as needed for wheezing or shortness of breath.   Azelastine HCl 0.15 % SOLN INL 2 PFS IEN QD   clobetasol cream (TEMOVATE) 5.80 % Apply 1 application topically 2 (two) times daily.   EPIPEN 2-PAK 0.3 MG/0.3ML SOAJ injection INJECT INTRAMUSCULARLY AS DIRECTED FOR ANAPHYLATIC REACTIONS   ergocalciferol (DRISDOL) 1.25 MG (50000 UT) capsule Take 1 capsule (50,000 Units total) by mouth once a week.   hydroxychloroquine (PLAQUENIL) 200 MG tablet Take 200 mg by mouth 2 (two) times daily.   losartan (COZAAR) 50 MG tablet Take 1 tablet (50 mg total) by mouth daily.   ondansetron (ZOFRAN ODT) 4 MG disintegrating tablet Take 1 tablet (4 mg total) by mouth every 8 (eight) hours as needed for nausea or vomiting.   [DISCONTINUED] hydrochlorothiazide (HYDRODIURIL) 25 MG tablet Take 1 tablet (25 mg total) by mouth daily.   [DISCONTINUED] hydrOXYzine (ATARAX/VISTARIL) 25 MG tablet Take 1 tablet (25 mg total) by mouth 2 (two) times daily as needed.   DULoxetine (CYMBALTA) 30 MG capsule Take 1 capsule (30 mg total) by mouth 2 (two) times daily.   hydrOXYzine (ATARAX/VISTARIL) 25 MG tablet Take 1 tablet (25 mg total) by mouth every 8 (eight) hours as needed for anxiety.   potassium chloride (KLOR-CON) 10 MEQ tablet Take 1 tablet (10 mEq total) by mouth daily for 7 days.   [DISCONTINUED] DULoxetine (CYMBALTA) 30 MG capsule Take 1 capsule (30 mg total) by mouth daily.   [DISCONTINUED] traZODone (DESYREL) 50 MG tablet Take 1 to 2 tablets po QHS prn anxiety   No facility-administered encounter medications on file as of 03/27/2021.    Follow-up: Return in about 3 weeks (around 04/17/2021) for mood,  bp, review lab.   Ronnell Freshwater, NP

## 2021-04-07 DIAGNOSIS — E876 Hypokalemia: Secondary | ICD-10-CM | POA: Insufficient documentation

## 2021-04-07 DIAGNOSIS — R21 Rash and other nonspecific skin eruption: Secondary | ICD-10-CM | POA: Insufficient documentation

## 2021-04-07 DIAGNOSIS — Z7689 Persons encountering health services in other specified circumstances: Secondary | ICD-10-CM | POA: Insufficient documentation

## 2021-04-17 ENCOUNTER — Ambulatory Visit (INDEPENDENT_AMBULATORY_CARE_PROVIDER_SITE_OTHER): Payer: BC Managed Care – PPO | Admitting: Nurse Practitioner

## 2021-04-17 ENCOUNTER — Encounter: Payer: Self-pay | Admitting: Nurse Practitioner

## 2021-04-17 ENCOUNTER — Other Ambulatory Visit: Payer: Self-pay

## 2021-04-17 VITALS — Ht 70.0 in | Wt 240.0 lb

## 2021-04-17 DIAGNOSIS — F411 Generalized anxiety disorder: Secondary | ICD-10-CM

## 2021-04-17 DIAGNOSIS — U071 COVID-19: Secondary | ICD-10-CM | POA: Insufficient documentation

## 2021-04-17 DIAGNOSIS — R059 Cough, unspecified: Secondary | ICD-10-CM | POA: Diagnosis not present

## 2021-04-17 DIAGNOSIS — R21 Rash and other nonspecific skin eruption: Secondary | ICD-10-CM

## 2021-04-17 DIAGNOSIS — J0141 Acute recurrent pansinusitis: Secondary | ICD-10-CM

## 2021-04-17 MED ORDER — HYDROCOD POLST-CPM POLST ER 10-8 MG/5ML PO SUER: 5.0000 mL | Freq: Two times a day (BID) | ORAL | 0 refills | Status: DC | PRN

## 2021-04-17 MED ORDER — AZITHROMYCIN 250 MG PO TABS
ORAL_TABLET | ORAL | 0 refills | Status: DC
Start: 2021-04-17 — End: 2021-07-01

## 2021-04-17 MED ORDER — METHYLPREDNISOLONE 4 MG PO TBPK: ORAL_TABLET | ORAL | 0 refills | Status: DC

## 2021-04-17 NOTE — Progress Notes (Signed)
Virtual Visit via Telephone Note  I connected with Linda Vargas on 04/17/21 at  2:30 PM EDT by telephone and verified that I am speaking with the correct person using two identifiers.  Location: Patient: home Provider: [place] [facility]     I discussed the limitations, risks, security and privacy concerns of performing an evaluation and management service by telephone and the availability of in person appointments. I also discussed with the patient that there may be a patient responsible charge related to this service. The patient expressed understanding and agreed to proceed.   History of Present Illness: Patient presents for a follow-up visit.  At her initial visit in this office, she was evaluated for recurrent hives which seem to be associated with anxiety and nerves.  We increased her Cymbalta 60 mg capsules to twice daily.  Her hydroxyzine 25 mg was increased to 3 times daily if needed.  She was also given a prescription for clobetasol twice daily as needed.  Patient states that hives have essentially resolved.  Anxiety has improved. The patient states that earlier last week she developed severe nasal congestion, cough, fatigue, body aches, and fever.  She states she states that on Friday she tested positive for COVID-19.  As time has gone on fever and body aches have improved.  Fatigue is also improved.  She has developed a dry harsh cough.  This is his productive at times.  She is also having diarrhea and continues to have severe nasal congestion.  She is outside the window for starting antiviral medications.  She has possibly developed a sinus infection on top of COVID-19.  She does have history of asthma.  She denies nausea and vomiting.   Observations/Objective:  The patient is alert and oriented. She is pleasant and answers all questions appropriately. Breathing is non-labored. She is in no acute distress at this time.  The patient is nasally congested.  She  sounds as though she physically feels poorly.  Assessment and Plan: 1. Acute recurrent pansinusitis Start azithromycin 250 mg tablets.  Take 2 tablets on day 1.  Take 1 tablet on days 2 through 10.Rest and increase fluids. Continue using OTC medication to control symptoms.  We will also start Medrol Dosepak.  Take as directed for 6 days. - methylPREDNISolone (MEDROL) 4 MG TBPK tablet; Take by mouth as directed for 6 days  Dispense: 21 tablet; Refill: 0 - azithromycin (ZITHROMAX) 250 MG tablet; Take 2 tablets po on day one. Take 1 tablet po days 2 through 10.  Dispense: 11 tablet; Refill: 0  2. Cough May take Tussionex up to twice daily as needed for cough. Advised patient not to overuse this medicine and not to mix with other medications or alcohol as it can cause respiratory distress, sleepiness or dizziness. Should also avoid driving. Patient voiced understanding and agreement.   - chlorpheniramine-HYDROcodone (TUSSIONEX PENNKINETIC ER) 10-8 MG/5ML SUER; Take 5 mLs by mouth every 12 (twelve) hours as needed for cough.  Dispense: 115 mL; Refill: 0  3. COVID-19 Patient positive for COVID-19 on Friday.  She should continue to isolate for 24 hours after her symptoms began to resolve.  She should continue to wear a mask in public for 5 days after isolation ends.  For questions she should consult CDC guidelines for isolation and quarantine due to COVID-19.  4. Rash and nonspecific skin eruption Essentially resolved.  We will continue to monitor.  5. Generalized anxiety disorder Improved.  Continue duloxetine twice  a day and hydroxyzine up to 3 times daily as needed for acute anxiety.  We will reassess in 3 months.  Follow Up Instructions:    I discussed the assessment and treatment plan with the patient. The patient was provided an opportunity to ask questions and all were answered. The patient agreed with the plan and demonstrated an understanding of the instructions.   The patient was advised  to call back or seek an in-person evaluation if the symptoms worsen or if the condition fails to improve as anticipated.  I provided 15 minutes of non-face-to-face time during this encounter.   Ronnell Freshwater, NP

## 2021-04-18 ENCOUNTER — Encounter: Payer: Self-pay | Admitting: Nurse Practitioner

## 2021-04-18 ENCOUNTER — Other Ambulatory Visit: Payer: Self-pay | Admitting: Nurse Practitioner

## 2021-04-18 DIAGNOSIS — R059 Cough, unspecified: Secondary | ICD-10-CM

## 2021-04-18 MED ORDER — HYDROCOD POLST-CPM POLST ER 10-8 MG/5ML PO SUER: 5.0000 mL | Freq: Two times a day (BID) | ORAL | 0 refills | Status: DC | PRN

## 2021-04-18 NOTE — Progress Notes (Signed)
Sent prescription for Tussionex to [facility] on [address] in [place].

## 2021-05-02 ENCOUNTER — Encounter: Payer: Self-pay | Admitting: Nurse Practitioner

## 2021-06-19 ENCOUNTER — Ambulatory Visit (INDEPENDENT_AMBULATORY_CARE_PROVIDER_SITE_OTHER): Payer: BC Managed Care – PPO | Admitting: Nurse Practitioner

## 2021-06-19 ENCOUNTER — Ambulatory Visit
Admission: RE | Admit: 2021-06-19 | Discharge: 2021-06-19 | Disposition: A | Discharge status: Home / Self Care | Payer: BC Managed Care – PPO | Attending: Nurse Practitioner | Admitting: Nurse Practitioner

## 2021-06-19 ENCOUNTER — Encounter: Payer: Self-pay | Admitting: Nurse Practitioner

## 2021-06-19 ENCOUNTER — Ambulatory Visit
Admission: RE | Admit: 2021-06-19 | Discharge: 2021-06-19 | Disposition: A | Discharge status: Home / Self Care | Payer: BC Managed Care – PPO | Source: Ambulatory Visit | Attending: Nurse Practitioner | Admitting: Nurse Practitioner

## 2021-06-19 ENCOUNTER — Other Ambulatory Visit: Payer: Self-pay

## 2021-06-19 VITALS — BP 147/87 | HR 60 | Temp 98.7°F | Ht 70.0 in | Wt 237.5 lb

## 2021-06-19 DIAGNOSIS — J0141 Acute recurrent pansinusitis: Secondary | ICD-10-CM

## 2021-06-19 DIAGNOSIS — U099 Post covid-19 condition, unspecified: Secondary | ICD-10-CM | POA: Insufficient documentation

## 2021-06-19 DIAGNOSIS — R300 Dysuria: Secondary | ICD-10-CM | POA: Diagnosis not present

## 2021-06-19 DIAGNOSIS — R5383 Other fatigue: Secondary | ICD-10-CM | POA: Diagnosis not present

## 2021-06-19 DIAGNOSIS — R059 Cough, unspecified: Secondary | ICD-10-CM

## 2021-06-19 DIAGNOSIS — I10 Essential (primary) hypertension: Secondary | ICD-10-CM

## 2021-06-19 LAB — POCT URINALYSIS DIP (CLINITEK)
Bilirubin, UA: NEGATIVE
Glucose, UA: NEGATIVE mg/dL
Ketones, POC UA: NEGATIVE mg/dL
Leukocytes, UA: NEGATIVE
Nitrite, UA: NEGATIVE
POC PROTEIN,UA: NEGATIVE
Spec Grav, UA: 1.025 (ref 1.010–1.025)
Urobilinogen, UA: 0.2 E.U./dL
pH, UA: 6 (ref 5.0–8.0)

## 2021-06-19 MED ORDER — METHYLPREDNISOLONE 4 MG PO TBPK: ORAL_TABLET | ORAL | 0 refills | Status: DC

## 2021-06-19 MED ORDER — CEFUROXIME AXETIL 500 MG PO TABS: 500.0000 mg | ORAL_TABLET | Freq: Two times a day (BID) | ORAL | 0 refills | Status: DC

## 2021-06-19 NOTE — Progress Notes (Signed)
Acute Office Visit  Subjective:    Patient ID: Linda Vargas, female    DOB: [birth year], 45 y.o.   MRN: [id number]  Chief Complaint  Patient presents with   Nasal Congestion    Patient has had intermittent upper respiratory type symptoms since having COVID-19 in July 2022.  She is a Education officer, museum.  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  With presentation of new symptoms, she did home test on Saturday, 06/15/2021.  Results were negative.  When symptoms only worsened, she had both rapid and PCR testing at a testing center on 06/18/2021.  Both tests were negative.  Cough This is a recurrent problem. The current episode started 1 to 4 weeks ago. The problem has been gradually worsening. The problem occurs every few minutes. The cough is Non-productive. Associated symptoms include ear pain, headaches, myalgias, nasal congestion, postnasal drip, rhinorrhea, shortness of breath and wheezing. Pertinent negatives include no chest pain, chills, rash or sore throat. The symptoms are aggravated by exercise and stress. She has tried OTC cough suppressant, prescription cough suppressant, a beta-agonist inhaler and body position changes for the symptoms. The treatment provided mild relief. Her past medical history is significant for asthma and environmental allergies. Has been intermittent problem since having COVID in July, 2022.    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   Fibromyalgia    Hypertension    Lupus ([place])    Migraine    Seasonal allergic rhinitis     Past Surgical History:  Procedure Laterality Date   CHOLECYSTECTOMY  2012   DILATION AND CURETTAGE OF UTERUS  2011   DILATION AND CURETTAGE OF UTERUS  01/17/2012   Procedure: DILATATION AND CURETTAGE;  Surgeon: Marylynn Pearson, MD;  Location: [facility];  Service: Gynecology;  Laterality: N/A;  Dilatation and curratage, insertion  of Bakri Balloon   FOOT SURGERY     HARDWARE REMOVAL Right 02/18/2018   Procedure: HARDWARE REMOVAL RIGHT WRIST;  Surgeon: Hessie Knows, MD;  Location: [facility];  Service: Orthopedics;  Laterality: Right;   OPEN REDUCTION INTERNAL FIXATION (ORIF) DISTAL RADIAL FRACTURE Right 11/26/2017   Procedure: OPEN REDUCTION INTERNAL FIXATION (ORIF) DISTAL RADIAL FRACTURE;  Surgeon: Hessie Knows, MD;  Location: [facility];  Service: Orthopedics;  Laterality: Right;   TONSILLECTOMY     WRIST SURGERY Right 02/18/2018    Family History  Problem Relation Age of Onset   Hypertension Mother    Hypertension Father    Diabetes Father    Heart disease Maternal Grandmother    Heart disease Paternal Grandmother    Anesthesia problems Neg Hx     Social History   Socioeconomic History   Marital status: Single    Spouse name: Not on file   Number of children: Not on file   Years of education: Not on file   Highest education level: Not on file  Occupational History   Not on file  Tobacco Use   Smoking status: Never   Smokeless tobacco: Never  Vaping Use   Vaping Use: Never used  Substance and Sexual Activity   Alcohol use: No   Drug use: No   Sexual activity: Yes    Birth control/protection: I.U.D.  Other Topics Concern   Not on file  Social History Narrative   Lives at home.   Social Determinants of Health   Financial Resource Strain: Not on  file  Food Insecurity: Not on file  Transportation Needs: Not on file  Physical Activity: Not on file  Stress: Not on file  Social Connections: Not on file  Intimate Partner Violence: Not on file    Outpatient Medications Prior to Visit  Medication Sig Dispense Refill   acetaminophen (TYLENOL) 500 MG tablet Take 1,000 mg by mouth every 8 (eight) hours as needed for headache.      albuterol (VENTOLIN HFA) 108 (90 Base) MCG/ACT inhaler Inhale 2 puffs into the lungs 3 (three) times daily as needed for wheezing or shortness of breath. 1 each 0    Azelastine HCl 0.15 % SOLN INL 2 PFS IEN QD  12   chlorpheniramine-HYDROcodone (TUSSIONEX PENNKINETIC ER) 10-8 MG/5ML SUER Take 5 mLs by mouth every 12 (twelve) hours as needed for cough. 115 mL 0   clobetasol cream (TEMOVATE) 6.50 % Apply 1 application topically 2 (two) times daily. 60 g 1   DULoxetine (CYMBALTA) 30 MG capsule Take 1 capsule (30 mg total) by mouth 2 (two) times daily. 60 capsule 3   EPIPEN 2-PAK 0.3 MG/0.3ML SOAJ injection INJECT INTRAMUSCULARLY AS DIRECTED FOR ANAPHYLATIC REACTIONS  0   ergocalciferol (DRISDOL) 1.25 MG (50000 UT) capsule Take 1 capsule (50,000 Units total) by mouth once a week. 4 capsule 5   hydroxychloroquine (PLAQUENIL) 200 MG tablet Take 200 mg by mouth 2 (two) times daily.     hydrOXYzine (ATARAX/VISTARIL) 25 MG tablet Take 1 tablet (25 mg total) by mouth every 8 (eight) hours as needed for anxiety. 90 tablet 1   losartan (COZAAR) 50 MG tablet Take 1 tablet (50 mg total) by mouth daily. 90 tablet 1   ondansetron (ZOFRAN ODT) 4 MG disintegrating tablet Take 1 tablet (4 mg total) by mouth every 8 (eight) hours as needed for nausea or vomiting. 20 tablet 0   azithromycin (ZITHROMAX) 250 MG tablet Take 2 tablets po on day one. Take 1 tablet po days 2 through 10. 11 tablet 0   potassium chloride (KLOR-CON) 10 MEQ tablet Take 1 tablet (10 mEq total) by mouth daily for 7 days. 7 tablet 0   methylPREDNISolone (MEDROL) 4 MG TBPK tablet Take by mouth as directed for 6 days 21 tablet 0   No facility-administered medications prior to visit.    Allergies  Allergen Reactions   Ibuprofen Swelling    Lips swell   Bean Pod Extract Hives   Corn-Containing Products Hives   Peanut-Containing Drug Products Hives    Review of Systems  Constitutional:  Positive for activity change and fatigue. Negative for appetite change and chills.  HENT:  Positive for congestion, ear pain, postnasal drip, rhinorrhea and sinus pressure. Negative for sinus pain, sneezing and sore throat.    Eyes: Negative.   Respiratory:  Positive for cough, shortness of breath and wheezing. Negative for chest tightness.   Cardiovascular:  Negative for chest pain and palpitations.       Elevated blood pressure.  Gastrointestinal:  Positive for nausea. Negative for abdominal pain, constipation, diarrhea and vomiting.  Endocrine: Negative for cold intolerance, heat intolerance, polydipsia and polyuria.  Genitourinary:  Positive for frequency and urgency. Negative for dyspareunia, dysuria and flank pain.  Musculoskeletal:  Positive for arthralgias and myalgias. Negative for back pain.  Skin:  Negative for rash.  Allergic/Immunologic: Positive for environmental allergies and food allergies.  Neurological:  Positive for headaches. Negative for dizziness and weakness.  Hematological:  Negative for adenopathy.  Psychiatric/Behavioral:  The patient is nervous/anxious.  Objective:    Physical Exam Vitals and nursing note reviewed.  Constitutional:      Appearance: Normal appearance. She is well-developed. She is obese. She is ill-appearing.  HENT:     Head: Normocephalic and atraumatic.     Right Ear: Tympanic membrane, ear canal and external ear normal.     Left Ear: Tympanic membrane, ear canal and external ear normal.     Nose: Congestion present.     Right Sinus: Maxillary sinus tenderness and frontal sinus tenderness present.     Left Sinus: Maxillary sinus tenderness and frontal sinus tenderness present.  Eyes:     Pupils: Pupils are equal, round, and reactive to light.  Neck:     Vascular: No carotid bruit.  Cardiovascular:     Rate and Rhythm: Normal rate and regular rhythm.     Pulses: Normal pulses.     Heart sounds: Normal heart sounds.  Pulmonary:     Effort: Pulmonary effort is normal.     Breath sounds: Normal breath sounds.     Comments: Dry, harsh, nonproductive cough present in the office today. Abdominal:     Palpations: Abdomen is soft.  Genitourinary:     Comments: Urine sample positive for trace of intact blood. Musculoskeletal:        General: Normal range of motion.     Cervical back: Normal range of motion and neck supple.  Lymphadenopathy:     Cervical: No cervical adenopathy.  Skin:    General: Skin is warm and dry.     Capillary Refill: Capillary refill takes less than 2 seconds.  Neurological:     General: No focal deficit present.     Mental Status: She is alert and oriented to person, place, and time.  Psychiatric:        Mood and Affect: Mood normal.        Behavior: Behavior normal.        Thought Content: Thought content normal.        Judgment: Judgment normal.    Today's Vitals   06/19/21 0933  BP: (!) 147/87  Pulse: 60  Temp: 98.7 F (37.1 C)  SpO2: 100%  Weight: 237 lb 8 oz (107.7 kg)  Height: 5' 10"  (1.778 m)   Body mass index is 34.08 kg/m.   Wt Readings from Last 3 Encounters:  06/19/21 237 lb 8 oz (107.7 kg)  04/17/21 240 lb (108.9 kg)  03/27/21 241 lb 3.2 oz (109.4 kg)    Health Maintenance Due  Topic Date Due   Pneumococcal Vaccine 31-31 Years old (1 - PCV) Never done   TETANUS/TDAP  Never done   COVID-19 Vaccine (4 - Booster) 01/08/2021   INFLUENZA VACCINE  05/13/2021    There are no preventive care reminders to display for this patient.   Lab Results  Component Value Date   TSH 2.890 04/04/2019   Lab Results  Component Value Date   WBC 6.6 11/08/2020   HGB 12.9 11/08/2020   HCT 39.0 11/08/2020   MCV 84.8 11/08/2020   PLT 400 11/08/2020   Lab Results  Component Value Date   NA 139 11/08/2020   K 2.8 (L) 11/08/2020   CO2 27 11/08/2020   GLUCOSE 83 11/08/2020   BUN 12 11/08/2020   CREATININE 1.09 (H) 11/08/2020   BILITOT 0.5 11/08/2020   ALKPHOS 73 11/08/2020   AST 23 11/08/2020   ALT 22 11/08/2020   PROT 7.4 11/08/2020   ALBUMIN 3.7 11/08/2020  CALCIUM 8.9 11/08/2020   ANIONGAP 11 11/08/2020   Lab Results  Component Value Date   CHOL 198 04/04/2019   Lab Results   Component Value Date   HDL 86 04/04/2019   Lab Results  Component Value Date   LDLCALC 100 (H) 04/04/2019   Lab Results  Component Value Date   TRIG 60 04/04/2019   Lab Results  Component Value Date   CHOLHDL 2.4 10/12/2015   No results found for: HGBA1C     Assessment & Plan:  1. Acute recurrent pansinusitis Start Ceftin 500 mg twice daily for 7 days.  We will add Medrol taper.  Take as directed for 6 days.  Encouraged her to rest and increase fluids.  Recommend she stay out of school for the rest of the week.  May try to return to work on Monday, 06/24/2021.  Use counter medications as needed and as indicated to alleviate acute symptoms - cefUROXime (CEFTIN) 500 MG tablet; Take 1 tablet (500 mg total) by mouth 2 (two) times daily with a meal.  Dispense: 14 tablet; Refill: 0 - methylPREDNISolone (MEDROL) 4 MG TBPK tablet; Take by mouth as directed for 6 days  Dispense: 21 tablet; Refill: 0  2. Cough Will get chest x-ray for further evaluation.  Recommend she use rescue inhaler as needed and as prescribed.  May also take previously prescribed Tussionex as needed twice daily for cough.  Reviewed risk factors and possible side effects of taking Tussionex.  She voiced understanding and agreement.   - DG Chest 2 View; Future  3. Fatigue, unspecified type We will check CBC and c-Met for further evaluation. - CBC with Differential/Platelet - Basic Metabolic Panel (BMET)  4. Dysuria Urine sample positive for trace intact blood only.  No evidence of infection.  Will monitor. - POCT URINALYSIS DIP (CLINITEK)  5. Essential hypertension Generally stable.  Continue blood pressure medications as prescribed.  6. COVID-19 long hauler Patient has had similar symptoms intermittently since having COVID-19 in July, 2022.  She did have 2 negative COVID-19 tests recently.  Home test on 06/15/2021 was negative.  Rapid and PCR testing on 06/18/2021 was negative.  Will monitor symptoms.  Problem List  Items Addressed This Visit       Cardiovascular and Mediastinum   Essential hypertension     Respiratory   Acute recurrent pansinusitis - Primary   Relevant Medications   cefUROXime (CEFTIN) 500 MG tablet   methylPREDNISolone (MEDROL) 4 MG TBPK tablet     Other   Fatigue   Relevant Orders   CBC with Differential/Platelet   Basic Metabolic Panel (BMET)   Cough   Relevant Orders   DG Chest 2 View   Dysuria   Relevant Orders   POCT URINALYSIS DIP (CLINITEK) (Completed)   COVID-19 long hauler     Meds ordered this encounter  Medications   cefUROXime (CEFTIN) 500 MG tablet    Sig: Take 1 tablet (500 mg total) by mouth 2 (two) times daily with a meal.    Dispense:  14 tablet    Refill:  0    Order Specific Question:   Supervising Provider    Answer:   Beatrice Lecher D [[id number]]   methylPREDNISolone (MEDROL) 4 MG TBPK tablet    Sig: Take by mouth as directed for 6 days    Dispense:  21 tablet    Refill:  0    Order Specific Question:   Supervising Provider    Answer:   Beatrice Lecher  D [[id number]]   This note was dictated using Systems analyst. Rapid proofreading was performed to expedite the delivery of the information. Despite proofreading, phonetic errors will occur which are common with this voice recognition software. Please take this into consideration. If there are any concerns, please contact our office.    Ronnell Freshwater, NP

## 2021-06-20 ENCOUNTER — Encounter: Payer: Self-pay | Admitting: Nurse Practitioner

## 2021-06-20 LAB — BASIC METABOLIC PANEL
BUN/Creatinine Ratio: 7 — ABNORMAL LOW (ref 9–23)
BUN: 8 mg/dL (ref 6–24)
CO2: 22 mmol/L (ref 20–29)
Calcium: 9.8 mg/dL (ref 8.7–10.2)
Chloride: 106 mmol/L (ref 96–106)
Creatinine, Ser: 1.07 mg/dL — ABNORMAL HIGH (ref 0.57–1.00)
Glucose: 84 mg/dL (ref 65–99)
Potassium: 4.6 mmol/L (ref 3.5–5.2)
Sodium: 141 mmol/L (ref 134–144)
eGFR: 66 mL/min/{1.73_m2} (ref >59)

## 2021-06-20 LAB — CBC WITH DIFFERENTIAL/PLATELET
Basophils Absolute: 0 10*3/uL (ref 0.0–0.2)
Basos: 1 %
EOS (ABSOLUTE): 0.2 10*3/uL (ref 0.0–0.4)
Eos: 5 %
Hematocrit: 38.3 % (ref 34.0–46.6)
Hemoglobin: 12.8 g/dL (ref 11.1–15.9)
Immature Grans (Abs): 0 10*3/uL (ref 0.0–0.1)
Immature Granulocytes: 0 %
Lymphocytes Absolute: 1.1 10*3/uL (ref 0.7–3.1)
Lymphs: 25 %
MCH: 27.7 pg (ref 26.6–33.0)
MCHC: 33.4 g/dL (ref 31.5–35.7)
MCV: 83 fL (ref 79–97)
Monocytes Absolute: 0.4 10*3/uL (ref 0.1–0.9)
Monocytes: 8 %
Neutrophils Absolute: 2.8 10*3/uL (ref 1.4–7.0)
Neutrophils: 61 %
Platelets: 438 10*3/uL (ref 150–450)
RBC: 4.62 x10E6/uL (ref 3.77–5.28)
RDW: 12.6 % (ref 11.7–15.4)
WBC: 4.6 10*3/uL (ref 3.4–10.8)

## 2021-06-20 NOTE — Progress Notes (Signed)
Negative chest x-ray. MyChart message sent to patient.

## 2021-06-21 ENCOUNTER — Encounter: Payer: Self-pay | Admitting: Nurse Practitioner

## 2021-06-21 NOTE — Progress Notes (Signed)
Renal functions mildly elevated. Advised her to increase water intake, especially when not feeing well. MyChart message sent to patient.

## 2021-06-26 ENCOUNTER — Encounter: Payer: Self-pay | Admitting: Nurse Practitioner

## 2021-07-01 ENCOUNTER — Ambulatory Visit: Payer: BC Managed Care – PPO | Admitting: Nurse Practitioner

## 2021-07-01 ENCOUNTER — Other Ambulatory Visit: Payer: Self-pay

## 2021-07-01 ENCOUNTER — Encounter: Payer: Self-pay | Admitting: Nurse Practitioner

## 2021-07-01 VITALS — BP 140/82 | HR 67 | Temp 98.4°F | Ht 70.0 in | Wt 240.1 lb

## 2021-07-01 DIAGNOSIS — R112 Nausea with vomiting, unspecified: Secondary | ICD-10-CM | POA: Insufficient documentation

## 2021-07-01 DIAGNOSIS — G43119 Migraine with aura, intractable, without status migrainosus: Secondary | ICD-10-CM | POA: Diagnosis not present

## 2021-07-01 DIAGNOSIS — I10 Essential (primary) hypertension: Secondary | ICD-10-CM

## 2021-07-01 DIAGNOSIS — U099 Post covid-19 condition, unspecified: Secondary | ICD-10-CM | POA: Diagnosis not present

## 2021-07-01 DIAGNOSIS — R110 Nausea: Secondary | ICD-10-CM | POA: Diagnosis not present

## 2021-07-01 MED ORDER — ONDANSETRON 8 MG PO TBDP: 8.0000 mg | ORAL_TABLET | Freq: Three times a day (TID) | ORAL | 2 refills | Status: DC | PRN

## 2021-07-01 MED ORDER — RIZATRIPTAN BENZOATE 10 MG PO TABS: 10.0000 mg | ORAL_TABLET | ORAL | 0 refills | Status: DC | PRN

## 2021-07-01 NOTE — Progress Notes (Signed)
Established Patient Office Visit  Subjective:  Patient ID: Linda Vargas, female    DOB: 04/30/1976  Age: 45 y.o. MRN: [id number]  CC:  Chief Complaint  Patient presents with   Migraine   The patient does have history of migraine headaches. Had been doing very well. Was having one migraine only one every couple of months. Had not needed t take anything. The patient states that since having Covid 19 in July, 2022, migraines have coe back patient getting very nauseated. Has to voit. Gets numbness and tingling in the right arm. She does have severe fatigue. Unsure if she is resting like she should. Is worrying more with more frequent headaches. Ast week on Thursday, she had headache so bad, she had to leave work, just after going back to work after having a severe sinus headache. She has [employer] paperwork with her as she has missed work intermittently from [employer] day. Will keep her out of work until 07/15/2021. Prior to then, will get MRI of the brain and start triptan for as needed use due to acute migraine headache.   Migraine  This is a recurrent problem. The current episode started more than 1 month ago. The problem occurs daily. The problem has been gradually worsening. Pain location: alternate from one side of the head to the other. sometimes, headaches are global. The pain radiates to the face and right arm (has had some tingling in the right arm whe nshe has migraine headache). The quality of the pain is described as throbbing, pulsating and aching. The pain is at a severity of 9/10. The pain is severe. Associated symptoms include dizziness, eye pain, a loss of balance, nausea, numbness, photophobia, tingling, a visual change, vomiting and weakness. Pertinent negatives include no abdominal pain, back pain, coughing, fever, rhinorrhea, sinus pressure or sore throat. The symptoms are aggravated by bright light, fatigue and emotional stress (strong smells). She has tried acetaminophen, cold  packs and darkened room for the symptoms. The treatment provided no relief. Her past medical history is significant for hypertension.      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   Fibromyalgia    Hypertension    Lupus ([place])    Migraine    Seasonal allergic rhinitis     Past Surgical History:  Procedure Laterality Date   CHOLECYSTECTOMY  2012   DILATION AND CURETTAGE OF UTERUS  2011   DILATION AND CURETTAGE OF UTERUS  01/17/2012   Procedure: DILATATION AND CURETTAGE;  Surgeon: Marylynn Pearson, MD;  Location: [facility];  Service: Gynecology;  Laterality: N/A;  Dilatation and curratage, insertion of Bakri Balloon   FOOT SURGERY     HARDWARE REMOVAL Right 02/18/2018   Procedure: HARDWARE REMOVAL RIGHT WRIST;  Surgeon: Hessie Knows, MD;  Location: [facility];  Service: Orthopedics;  Laterality: Right;   OPEN REDUCTION INTERNAL FIXATION (ORIF) DISTAL RADIAL FRACTURE Right 11/26/2017   Procedure: OPEN REDUCTION INTERNAL FIXATION (ORIF) DISTAL RADIAL FRACTURE;  Surgeon: Hessie Knows, MD;  Location: [facility];  Service: Orthopedics;  Laterality: Right;   TONSILLECTOMY     WRIST SURGERY Right 02/18/2018    Family History  Problem Relation Age of Onset   Hypertension Mother    Hypertension Father    Diabetes Father    Heart disease Maternal Grandmother    Heart disease Paternal Grandmother    Anesthesia problems Neg Hx     Social History  Socioeconomic History   Marital status: Single    Spouse name: Not on file   Number of children: Not on file   Years of education: Not on file   Highest education level: Not on file  Occupational History   Not on file  Tobacco Use   Smoking status: Never   Smokeless tobacco: Never  Vaping Use   Vaping Use: Never used  Substance and Sexual Activity   Alcohol use: No   Drug use: No   Sexual activity: Yes    Birth control/protection: I.U.D.  Other Topics Concern   Not on file   Social History Narrative   Lives at home.   Social Determinants of Health   Financial Resource Strain: Not on file  Food Insecurity: Not on file  Transportation Needs: Not on file  Physical Activity: Not on file  Stress: Not on file  Social Connections: Not on file  Intimate Partner Violence: Not on file    Outpatient Medications Prior to Visit  Medication Sig Dispense Refill   acetaminophen (TYLENOL) 500 MG tablet Take 1,000 mg by mouth every 8 (eight) hours as needed for headache.      albuterol (VENTOLIN HFA) 108 (90 Base) MCG/ACT inhaler Inhale 2 puffs into the lungs 3 (three) times daily as needed for wheezing or shortness of breath. 1 each 0   Azelastine HCl 0.15 % SOLN INL 2 PFS IEN QD  12   clobetasol cream (TEMOVATE) 8.82 % Apply 1 application topically 2 (two) times daily. 60 g 1   DULoxetine (CYMBALTA) 30 MG capsule Take 1 capsule (30 mg total) by mouth 2 (two) times daily. 60 capsule 3   EPIPEN 2-PAK 0.3 MG/0.3ML SOAJ injection INJECT INTRAMUSCULARLY AS DIRECTED FOR ANAPHYLATIC REACTIONS  0   ergocalciferol (DRISDOL) 1.25 MG (50000 UT) capsule Take 1 capsule (50,000 Units total) by mouth once a week. 4 capsule 5   hydroxychloroquine (PLAQUENIL) 200 MG tablet Take 200 mg by mouth 2 (two) times daily.     hydrOXYzine (ATARAX/VISTARIL) 25 MG tablet Take 1 tablet (25 mg total) by mouth every 8 (eight) hours as needed for anxiety. 90 tablet 1   losartan (COZAAR) 50 MG tablet Take 1 tablet (50 mg total) by mouth daily. 90 tablet 1   cefUROXime (CEFTIN) 500 MG tablet Take 1 tablet (500 mg total) by mouth 2 (two) times daily with a meal. 14 tablet 0   chlorpheniramine-HYDROcodone (TUSSIONEX PENNKINETIC ER) 10-8 MG/5ML SUER Take 5 mLs by mouth every 12 (twelve) hours as needed for cough. 115 mL 0   methylPREDNISolone (MEDROL) 4 MG TBPK tablet Take by mouth as directed for 6 days 21 tablet 0   ondansetron (ZOFRAN ODT) 4 MG disintegrating tablet Take 1 tablet (4 mg total) by mouth  every 8 (eight) hours as needed for nausea or vomiting. 20 tablet 0   potassium chloride (KLOR-CON) 10 MEQ tablet Take 1 tablet (10 mEq total) by mouth daily for 7 days. 7 tablet 0   azithromycin (ZITHROMAX) 250 MG tablet Take 2 tablets po on day one. Take 1 tablet po days 2 through 10. 11 tablet 0   No facility-administered medications prior to visit.    Allergies  Allergen Reactions   Ibuprofen Swelling    Lips swell   Bean Pod Extract Hives   Corn-Containing Products Hives   Peanut-Containing Drug Products Hives    ROS Review of Systems  Constitutional:  Positive for activity change and fatigue. Negative for appetite change, chills and fever.  HENT:  Negative for congestion, postnasal drip, rhinorrhea, sinus pressure, sinus pain, sneezing and sore throat.   Eyes:  Positive for photophobia and pain.  Respiratory:  Negative for cough, chest tightness, shortness of breath and wheezing.   Cardiovascular:  Negative for chest pain and palpitations.  Gastrointestinal:  Positive for nausea and vomiting. Negative for abdominal pain, constipation and diarrhea.       Nausea and vomiting with migrainhe headaches   Endocrine: Negative for cold intolerance, heat intolerance, polydipsia and polyuria.  Genitourinary:  Negative for dyspareunia, dysuria, flank pain, frequency and urgency.  Musculoskeletal:  Negative for arthralgias, back pain and myalgias.  Skin:  Negative for rash.  Allergic/Immunologic: Negative for environmental allergies.  Neurological:  Positive for dizziness, tingling, weakness, numbness, headaches and loss of balance.  Hematological:  Negative for adenopathy.  Psychiatric/Behavioral:  Positive for sleep disturbance. The patient is nervous/anxious.      Objective:    Physical Exam Vitals and nursing note reviewed.  Constitutional:      Appearance: Normal appearance. She is well-developed. She is obese. She is ill-appearing.  HENT:     Head: Normocephalic and  atraumatic.     Nose: Nose normal.     Mouth/Throat:     Mouth: Mucous membranes are moist.     Pharynx: Oropharynx is clear.  Eyes:     Extraocular Movements: Extraocular movements intact.     Conjunctiva/sclera: Conjunctivae normal.     Pupils: Pupils are equal, round, and reactive to light.  Neck:     Vascular: No carotid bruit.  Cardiovascular:     Rate and Rhythm: Normal rate and regular rhythm.     Pulses: Normal pulses.     Heart sounds: Normal heart sounds.  Pulmonary:     Effort: Pulmonary effort is normal.     Breath sounds: Normal breath sounds.  Abdominal:     Palpations: Abdomen is soft.  Musculoskeletal:        General: Normal range of motion.     Cervical back: Normal range of motion and neck supple.  Lymphadenopathy:     Cervical: No cervical adenopathy.  Skin:    General: Skin is warm and dry.     Capillary Refill: Capillary refill takes less than 2 seconds.  Neurological:     General: No focal deficit present.     Mental Status: She is alert and oriented to person, place, and time.     Cranial Nerves: No cranial nerve deficit.     Sensory: No sensory deficit.     Motor: No weakness.     Coordination: Coordination normal.     Gait: Gait normal.  Psychiatric:        Mood and Affect: Mood normal.        Behavior: Behavior normal.        Thought Content: Thought content normal.        Judgment: Judgment normal.    Today's Vitals   07/01/21 1337  BP: 140/82  Pulse: 67  Temp: 98.4 F (36.9 C)  SpO2: 100%  Weight: 240 lb 1.6 oz (108.9 kg)  Height: 5' 10"  (1.778 m)   Body mass index is 34.45 kg/m.   Wt Readings from Last 3 Encounters:  07/01/21 240 lb 1.6 oz (108.9 kg)  06/19/21 237 lb 8 oz (107.7 kg)  04/17/21 240 lb (108.9 kg)     Health Maintenance Due  Topic Date Due   TETANUS/TDAP  Never done   COVID-19 Vaccine (4 -  Booster) 01/02/2021   INFLUENZA VACCINE  05/13/2021    There are no preventive care reminders to display for this  patient.  Lab Results  Component Value Date   TSH 2.890 04/04/2019   Lab Results  Component Value Date   WBC 4.6 06/19/2021   HGB 12.8 06/19/2021   HCT 38.3 06/19/2021   MCV 83 06/19/2021   PLT 438 06/19/2021   Lab Results  Component Value Date   NA 141 06/19/2021   K 4.6 06/19/2021   CO2 22 06/19/2021   GLUCOSE 84 06/19/2021   BUN 8 06/19/2021   CREATININE 1.07 (H) 06/19/2021   BILITOT 0.5 11/08/2020   ALKPHOS 73 11/08/2020   AST 23 11/08/2020   ALT 22 11/08/2020   PROT 7.4 11/08/2020   ALBUMIN 3.7 11/08/2020   CALCIUM 9.8 06/19/2021   ANIONGAP 11 11/08/2020   EGFR 66 06/19/2021   Lab Results  Component Value Date   CHOL 198 04/04/2019   Lab Results  Component Value Date   HDL 86 04/04/2019   Lab Results  Component Value Date   LDLCALC 100 (H) 04/04/2019   Lab Results  Component Value Date   TRIG 60 04/04/2019   Lab Results  Component Value Date   CHOLHDL 2.4 10/12/2015   No results found for: HGBA1C    Assessment & Plan:  1. Intractable migraine with aura without status migrainosus New onset of severe and nearly daily migraines since having COVID 19 in July of 2022. She does have history of migraines, just not to this degree. Will get new MRI of the brain for further evaluation. [facility]. [facility]. May repeat in two hours if needed for persistent migraine. Add zofran 833morally disintegrating tablets. May take up to three times daily as needed for nausea. Will complete FMLA paperwork for patient starting from June 18, 2021, as she has not worked a full day since [employer] keep her out of work, returning on 07/15/2021. Will review MRI and treatments tried prior to return to work. Patient is agreeable to this plan.  - MR BRAIN WO CONTRAST; Future - rizatriptan (MAXALT) 10 MG tablet; Take 1 tablet (10 mg total) by mouth as needed for migraine. May repeat in 2 hours if needed  Dispense: 10 tablet; Refill: 0 -  ondansetron (ZOFRAN ODT) 8 MG disintegrating tablet; Take 1 tablet (8 mg total) by mouth every 8 (eight) hours as needed for nausea or vomiting.  Dispense: 30 tablet; Refill: 2  2. Nausea Associated with migraine headaches. Add zofran [id number] disintegrating tablets. May take up to three times daily as needed for nausea.  - ondansetron (ZOFRAN ODT) 8 MG disintegrating tablet; Take 1 tablet (8 mg total) by mouth every 8 (eight) hours as needed for nausea or vomiting.  Dispense: 30 tablet; Refill: 2  3. COVID-19 long hauler Patient with neew onset of severe and nearlhy daily headaches since having COVID 19 in July 2022.   4. Essential hypertension Generally stable. Contiue bp medication as prescribed    Problem List Items Addressed This Visit       Cardiovascular and Mediastinum   Essential hypertension   Intractable migraine with aura without status migrainosus - Primary   Relevant Medications   rizatriptan (MAXALT) 10 MG tablet   ondansetron (ZOFRAN ODT) 8 MG disintegrating tablet   Other Relevant Orders   MR BRAIN WO CONTRAST     Other   COVID-19 long hauler  Nausea   Relevant Medications   ondansetron (ZOFRAN ODT) 8 MG disintegrating tablet    Meds ordered this encounter  Medications   rizatriptan (MAXALT) 10 MG tablet    Sig: Take 1 tablet (10 mg total) by mouth as needed for migraine. May repeat in 2 hours if needed    Dispense:  10 tablet    Refill:  0    Order Specific Question:   Supervising Provider    Answer:   Beatrice Lecher D [[id number]]   ondansetron (ZOFRAN ODT) 8 MG disintegrating tablet    Sig: Take 1 tablet (8 mg total) by mouth every 8 (eight) hours as needed for nausea or vomiting.    Dispense:  30 tablet    Refill:  2    Order Specific Question:   Supervising Provider    Answer:   Beatrice Lecher D [[id number]]   This note was dictated using Dragon Voice Recognition Software. Rapid proofreading was performed to expedite the delivery of the  information. Despite proofreading, phonetic errors will occur which are common with this voice recognition software. Please take this into consideration. If there are any concerns, please contact our office.     Follow-up: Return in about 1 week (around 07/08/2021) for added maxalt and review MRi results .    Ronnell Freshwater, NP

## 2021-07-08 ENCOUNTER — Ambulatory Visit (INDEPENDENT_AMBULATORY_CARE_PROVIDER_SITE_OTHER): Payer: BC Managed Care – PPO | Admitting: Nurse Practitioner

## 2021-07-08 ENCOUNTER — Encounter: Payer: Self-pay | Admitting: Nurse Practitioner

## 2021-07-08 ENCOUNTER — Other Ambulatory Visit: Payer: Self-pay

## 2021-07-08 VITALS — BP 142/83 | HR 62 | Temp 98.8°F | Ht 70.0 in | Wt 241.0 lb

## 2021-07-08 DIAGNOSIS — I10 Essential (primary) hypertension: Secondary | ICD-10-CM

## 2021-07-08 DIAGNOSIS — G43119 Migraine with aura, intractable, without status migrainosus: Secondary | ICD-10-CM

## 2021-07-08 DIAGNOSIS — R5383 Other fatigue: Secondary | ICD-10-CM

## 2021-07-08 DIAGNOSIS — U099 Post covid-19 condition, unspecified: Secondary | ICD-10-CM | POA: Diagnosis not present

## 2021-07-08 MED ORDER — NURTEC 75 MG PO TBDP: ORAL_TABLET | ORAL | 3 refills | Status: DC

## 2021-07-08 NOTE — Progress Notes (Signed)
 Established Patient Office Visit  Subjective:  Patient ID: Linda Vargas, female    DOB: 10/17/1975  Age: 45 y.o. MRN: [id number]  CC:  Chief Complaint  Patient presents with   Medication Problem   Headache    HPI The patient states that laying down and resting seems to be more effective. She is reporting a lot of fatigue which seemed to get worse with addition of maxalt. Feels like headaches are slightly less severe. Loud noises and light make this pain worse. She was scheduled o have MRI of the brain. She did take maxalt, and missed initial appointment. She needs to have this rescheduled.   Headache  This is a chronic problem. The current episode started more than 1 month ago. The problem occurs daily. The problem has been rapidly improving. The pain is located in the Right unilateral region. The pain radiates to the face (right ear pain). The pain quality is similar to prior headaches. The quality of the pain is described as throbbing and sharp. Associated symptoms include dizziness, ear pain, eye pain, a loss of balance, nausea, photophobia, tingling and vomiting. Pertinent negatives include no abdominal pain, back pain, coughing, fever, numbness, rhinorrhea, sinus pressure, sore throat or weakness. The symptoms are aggravated by noise and fatigue. She has tried darkened room, triptans and antidepressants for the symptoms. The treatment provided mild relief. Her past medical history is significant for migraine headaches. She was given FMLA due to severity and frequency of the migraines. She is supposed to go back to work on 07/15/2021. Migraines, along with nausea and vomiting, that accompanies these headaches, will need to extend her leave an additional two weeks. This will put her back to work on 07/29/2021. I will want to see her back sometime the week prior to evaluate her readiness to return to work.     Past Medical History:  Diagnosis Date   Anxiety    Connective tissue  disorder (HCC)    undifferentiated   Fibromyalgia    Hypertension    Lupus (HCC)    Migraine    Seasonal allergic rhinitis     Past Surgical History:  Procedure Laterality Date   CHOLECYSTECTOMY  2012   DILATION AND CURETTAGE OF UTERUS  2011   DILATION AND CURETTAGE OF UTERUS  01/17/2012   Procedure: DILATATION AND CURETTAGE;  Surgeon: Gretchen Adkins, MD;  Location: [facility];  Service: Gynecology;  Laterality: N/A;  Dilatation and curratage, insertion of Bakri Balloon   FOOT SURGERY     HARDWARE REMOVAL Right 02/18/2018   Procedure: HARDWARE REMOVAL RIGHT WRIST;  Surgeon: Menz, Michael, MD;  Location: [facility];  Service: Orthopedics;  Laterality: Right;   OPEN REDUCTION INTERNAL FIXATION (ORIF) DISTAL RADIAL FRACTURE Right 11/26/2017   Procedure: OPEN REDUCTION INTERNAL FIXATION (ORIF) DISTAL RADIAL FRACTURE;  Surgeon: Menz, Michael, MD;  Location: [facility];  Service: Orthopedics;  Laterality: Right;   TONSILLECTOMY     WRIST SURGERY Right 02/18/2018    Family History  Problem Relation Age of Onset   Hypertension Mother    Hypertension Father    Diabetes Father    Heart disease Maternal Grandmother    Heart disease Paternal Grandmother    Anesthesia problems Neg Hx     Social History   Socioeconomic History   Marital status: Single    Spouse name: Not on file   Number of children: Not on file   Years of education: Not on file   Highest education level: Not on   file  Occupational History   Not on file  Tobacco Use   Smoking status: Never   Smokeless tobacco: Never  Vaping Use   Vaping Use: Never used  Substance and Sexual Activity   Alcohol use: No   Drug use: No   Sexual activity: Yes    Birth control/protection: I.U.D.  Other Topics Concern   Not on file  Social History Narrative   Lives at home.   Social Determinants of Health   Financial Resource Strain: Not on file  Food Insecurity: Not on file  Transportation Needs: Not on file  Physical Activity: Not on  file  Stress: Not on file  Social Connections: Not on file  Intimate Partner Violence: Not on file    Outpatient Medications Prior to Visit  Medication Sig Dispense Refill   acetaminophen (TYLENOL) 500 MG tablet Take 1,000 mg by mouth every 8 (eight) hours as needed for headache.      albuterol (VENTOLIN HFA) 108 (90 Base) MCG/ACT inhaler Inhale 2 puffs into the lungs 3 (three) times daily as needed for wheezing or shortness of breath. 1 each 0   Azelastine HCl 0.15 % SOLN INL 2 PFS IEN QD  12   clobetasol cream (TEMOVATE) 7.74 % Apply 1 application topically 2 (two) times daily. 60 g 1   DULoxetine (CYMBALTA) 30 MG capsule Take 1 capsule (30 mg total) by mouth 2 (two) times daily. 60 capsule 3   EPIPEN 2-PAK 0.3 MG/0.3ML SOAJ injection INJECT INTRAMUSCULARLY AS DIRECTED FOR ANAPHYLATIC REACTIONS  0   ergocalciferol (DRISDOL) 1.25 MG (50000 UT) capsule Take 1 capsule (50,000 Units total) by mouth once a week. 4 capsule 5   hydroxychloroquine (PLAQUENIL) 200 MG tablet Take 200 mg by mouth 2 (two) times daily.     hydrOXYzine (ATARAX/VISTARIL) 25 MG tablet Take 1 tablet (25 mg total) by mouth every 8 (eight) hours as needed for anxiety. 90 tablet 1   losartan (COZAAR) 50 MG tablet Take 1 tablet (50 mg total) by mouth daily. 90 tablet 1   ondansetron (ZOFRAN ODT) 8 MG disintegrating tablet Take 1 tablet (8 mg total) by mouth every 8 (eight) hours as needed for nausea or vomiting. 30 tablet 2   rizatriptan (MAXALT) 10 MG tablet Take 1 tablet (10 mg total) by mouth as needed for migraine. May repeat in 2 hours if needed 10 tablet 0   potassium chloride (KLOR-CON) 10 MEQ tablet Take 1 tablet (10 mEq total) by mouth daily for 7 days. 7 tablet 0   No facility-administered medications prior to visit.    Allergies  Allergen Reactions   Ibuprofen Swelling    Lips swell   Bean Pod Extract Hives   Corn-Containing Products Hives   Peanut-Containing Drug Products Hives    ROS Review of Systems   Constitutional:  Positive for activity change and fatigue. Negative for appetite change, chills and fever.  HENT:  Positive for ear pain. Negative for congestion, postnasal drip, rhinorrhea, sinus pressure, sinus pain, sneezing and sore throat.        Patient reporting right ear pain with migraine headache.  Eyes:  Positive for photophobia and pain.  Respiratory:  Negative for cough, chest tightness, shortness of breath and wheezing.   Cardiovascular:  Negative for chest pain and palpitations.       Blood pressure elevated today.  Gastrointestinal:  Positive for nausea and vomiting. Negative for abdominal pain, constipation and diarrhea.       Nausea and vomiting with migrainhe  headaches   Endocrine: Negative for cold intolerance, heat intolerance, polydipsia and polyuria.  Genitourinary:  Negative for dyspareunia, dysuria, flank pain, frequency and urgency.  Musculoskeletal:  Negative for arthralgias, back pain and myalgias.  Skin:  Negative for rash.  Allergic/Immunologic: Negative for environmental allergies.  Neurological:  Positive for dizziness, tingling, headaches and loss of balance. Negative for weakness and numbness.  Hematological:  Negative for adenopathy.  Psychiatric/Behavioral:  Negative for sleep disturbance. The patient is nervous/anxious.      Objective:    Physical Exam Vitals and nursing note reviewed.  Constitutional:      Appearance: Normal appearance. She is well-developed. She is obese. She is ill-appearing.     Comments: Appears slightly less ill-appearing than at prior visit.  HENT:     Head: Normocephalic and atraumatic.     Nose: Nose normal.     Mouth/Throat:     Mouth: Mucous membranes are moist.     Pharynx: Oropharynx is clear.  Eyes:     General: No visual field deficit.    Extraocular Movements: Extraocular movements intact.     Conjunctiva/sclera: Conjunctivae normal.     Pupils: Pupils are equal, round, and reactive to light.  Neck:      Vascular: No carotid bruit.  Cardiovascular:     Rate and Rhythm: Normal rate and regular rhythm.     Pulses: Normal pulses.     Heart sounds: Normal heart sounds.  Pulmonary:     Effort: Pulmonary effort is normal.     Breath sounds: Normal breath sounds.  Abdominal:     Palpations: Abdomen is soft.  Musculoskeletal:        General: Normal range of motion.     Cervical back: Normal range of motion and neck supple.  Lymphadenopathy:     Cervical: No cervical adenopathy.  Skin:    General: Skin is warm and dry.     Capillary Refill: Capillary refill takes less than 2 seconds.  Neurological:     General: No focal deficit present.     Mental Status: She is alert and oriented to person, place, and time.     GCS: GCS eye subscore is 4. GCS verbal subscore is 5. GCS motor subscore is 6.     Cranial Nerves: No dysarthria or facial asymmetry.     Motor: No weakness.  Psychiatric:        Mood and Affect: Mood normal.        Behavior: Behavior normal.        Thought Content: Thought content normal.        Judgment: Judgment normal.    Today's Vitals   07/08/21 1118  BP: (!) 142/83  Pulse: 62  Temp: 98.8 F (37.1 C)  SpO2: 100%  Weight: 241 lb (109.3 kg)  Height: 5' 10" (1.778 m)   Body mass index is 34.58 kg/m.   Wt Readings from Last 3 Encounters:  07/08/21 241 lb (109.3 kg)  07/01/21 240 lb 1.6 oz (108.9 kg)  06/19/21 237 lb 8 oz (107.7 kg)     Health Maintenance Due  Topic Date Due   TETANUS/TDAP  Never done   COVID-19 Vaccine (4 - Booster) 01/02/2021   INFLUENZA VACCINE  05/13/2021    There are no preventive care reminders to display for this patient.  Lab Results  Component Value Date   TSH 2.890 04/04/2019   Lab Results  Component Value Date   WBC 4.6 06/19/2021   HGB 12.8 06/19/2021  HCT 38.3 06/19/2021   MCV 83 06/19/2021   PLT 438 06/19/2021   Lab Results  Component Value Date   NA 141 06/19/2021   K 4.6 06/19/2021   CO2 22 06/19/2021    GLUCOSE 84 06/19/2021   BUN 8 06/19/2021   CREATININE 1.07 (H) 06/19/2021   BILITOT 0.5 11/08/2020   ALKPHOS 73 11/08/2020   AST 23 11/08/2020   ALT 22 11/08/2020   PROT 7.4 11/08/2020   ALBUMIN 3.7 11/08/2020   CALCIUM 9.8 06/19/2021   ANIONGAP 11 11/08/2020   EGFR 66 06/19/2021   Lab Results  Component Value Date   CHOL 198 04/04/2019   Lab Results  Component Value Date   HDL 86 04/04/2019   Lab Results  Component Value Date   LDLCALC 100 (H) 04/04/2019   Lab Results  Component Value Date   TRIG 60 04/04/2019   Lab Results  Component Value Date   CHOLHDL 2.4 10/12/2015   No results found for: HGBA1C    Assessment & Plan:  1. Intractable migraine with aura without status migrainosus Patient with negative side effects from triptan medication.  Blood pressure elevated and increased fatigue.  We will do trial of Nurtec 75 mg tablets.  May take once for acute headache.  Maximum dosing is 75 mg and single 24-hour period.  New prescription sent to pharmacy.  Will complete prior authorization paperwork as indicated.  Patient advised to reschedule MRI of brain. The family leave for additional 3 weeks, putting her back to work on 07/29/2021.  A work note was given to the patient. - Rimegepant Sulfate (NURTEC) 75 MG TBDP; Take 1 tablet by mouth x 1 dose for acute headache. (Max 75 mg/day)  Dispense: 10 tablet; Refill: 3  2. Essential hypertension Blood pressure elevated today.  This is likely from triptan medication increase in blood pressure.  Continue medications as prescribed.  Trying to change triptan to Nurtec.  We will reassess at next visit in approximately 2 weeks  3. Fatigue, unspecified type Increased fatigue with addition of Maxalt.  We will try to be changed to Nurtec.  We will reevaluate fatigue at next visit in approximately 2 weeks.  4. COVID-19 long hauler Chronic, persistent, daily headache likely residual effect from COVID-19.  We will continue to monitor  closely.  Problem List Items Addressed This Visit       Cardiovascular and Mediastinum   Essential hypertension   Intractable migraine with aura without status migrainosus - Primary   Relevant Medications   Rimegepant Sulfate (NURTEC) 75 MG TBDP     Other   Fatigue   COVID-19 long hauler    Meds ordered this encounter  Medications   Rimegepant Sulfate (NURTEC) 75 MG TBDP    Sig: Take 1 tablet by mouth x 1 dose for acute headache. (Max 75 mg/day)    Dispense:  10 tablet    Refill:  3    Patinet having very negative side effects from triptan treatment.    Order Specific Question:   Supervising Provider    Answer:   Beatrice Lecher D [[id number]]    Follow-up: Return in about 17 days (around 07/25/2021) for chronic headache - can be anytime uring that week - see below.    Ronnell Freshwater, NP

## 2021-07-14 ENCOUNTER — Ambulatory Visit: Payer: BC Managed Care – PPO

## 2021-07-20 ENCOUNTER — Ambulatory Visit
Admission: RE | Admit: 2021-07-20 | Discharge: 2021-07-20 | Disposition: A | Discharge status: Home / Self Care | Payer: BC Managed Care – PPO | Source: Ambulatory Visit | Attending: Nurse Practitioner | Admitting: Nurse Practitioner

## 2021-07-20 ENCOUNTER — Other Ambulatory Visit: Payer: Self-pay

## 2021-07-20 DIAGNOSIS — G43119 Migraine with aura, intractable, without status migrainosus: Secondary | ICD-10-CM | POA: Insufficient documentation

## 2021-07-21 NOTE — Progress Notes (Signed)
Mild white matter disease, slightly progressed since last visit. Possible pituitary cyst vs. Microadenoma. Review with patient at visit 07/24/2021. Fefer to neurology. Also paranasal sinus disease. May need referral to ENT also.

## 2021-07-24 ENCOUNTER — Encounter: Payer: Self-pay | Admitting: Nurse Practitioner

## 2021-07-24 ENCOUNTER — Ambulatory Visit: Payer: BC Managed Care – PPO | Admitting: Nurse Practitioner

## 2021-07-24 ENCOUNTER — Other Ambulatory Visit: Payer: Self-pay

## 2021-07-24 VITALS — BP 129/84 | HR 64 | Temp 98.2°F | Ht 70.0 in | Wt 241.8 lb

## 2021-07-24 DIAGNOSIS — E237 Disorder of pituitary gland, unspecified: Secondary | ICD-10-CM

## 2021-07-24 DIAGNOSIS — R9082 White matter disease, unspecified: Secondary | ICD-10-CM | POA: Diagnosis not present

## 2021-07-24 DIAGNOSIS — G43119 Migraine with aura, intractable, without status migrainosus: Secondary | ICD-10-CM

## 2021-07-24 DIAGNOSIS — D352 Benign neoplasm of pituitary gland: Secondary | ICD-10-CM | POA: Insufficient documentation

## 2021-07-24 DIAGNOSIS — J324 Chronic pansinusitis: Secondary | ICD-10-CM | POA: Diagnosis not present

## 2021-07-24 DIAGNOSIS — I10 Essential (primary) hypertension: Secondary | ICD-10-CM

## 2021-07-24 MED ORDER — DOXYCYCLINE HYCLATE 100 MG PO TABS: 100.0000 mg | ORAL_TABLET | Freq: Two times a day (BID) | ORAL | 0 refills | Status: DC

## 2021-07-24 MED ORDER — RIZATRIPTAN BENZOATE 10 MG PO TABS: 10.0000 mg | ORAL_TABLET | ORAL | 2 refills | Status: DC | PRN

## 2021-07-24 NOTE — Progress Notes (Signed)
Established Patient Office Visit  Subjective:  Patient ID: Linda Vargas, female    DOB: 03-26-1976  Age: 45 y.o. MRN: [id number]  CC:  Chief Complaint  Patient presents with   Headache    HPI Linda Vargas presents for follow up of headaches. She continues to have headaches. She states that over past few weeks, frequency has decreased from everyday to four days. Today, she has regular headache. She states that last Thursday, she had headache which resulted in confusion and difficulty with speech. She was unable to get any words out. Speech was slurred. She states that she has missed several of her daughter's appointments recently. This is not normal for her. She states that headaches are accompanied with numbness in the arms. She did have MRI on Saturday. Mri is showing lesion adjacent to the pituitary gland. This is possibly a pituitary microadenoma or developmental cyst. There is also mild, progressing white matter disease now present. Finally, she has paranasal sinus disease.  She continues to have urinary frequency. She states this started out as frequent urges to use the bathroom with very little urine produced. Now, she states that each time she feels urge to urinate, she produces a great deal of urine each time. She denies dysuria, hematuria, or abdominal pain. She has been on FMLA leave due to the headaches and associated symptoms. She is due to go back to work on Monday. I believe it in her best interest to continue to stay out of work on [employer] until she is formally evaluated by neurologist and symptoms are better managed.   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   Fibromyalgia    Hypertension    Lupus ([place])    Migraine    Seasonal allergic rhinitis     Past Surgical History:  Procedure Laterality Date   CHOLECYSTECTOMY  2012   DILATION AND CURETTAGE OF UTERUS  2011   DILATION AND CURETTAGE OF  UTERUS  01/17/2012   Procedure: DILATATION AND CURETTAGE;  Surgeon: Marylynn Pearson, MD;  Location: [facility];  Service: Gynecology;  Laterality: N/A;  Dilatation and curratage, insertion of Bakri Balloon   FOOT SURGERY     HARDWARE REMOVAL Right 02/18/2018   Procedure: HARDWARE REMOVAL RIGHT WRIST;  Surgeon: Hessie Knows, MD;  Location: [facility];  Service: Orthopedics;  Laterality: Right;   OPEN REDUCTION INTERNAL FIXATION (ORIF) DISTAL RADIAL FRACTURE Right 11/26/2017   Procedure: OPEN REDUCTION INTERNAL FIXATION (ORIF) DISTAL RADIAL FRACTURE;  Surgeon: Hessie Knows, MD;  Location: [facility];  Service: Orthopedics;  Laterality: Right;   TONSILLECTOMY     WRIST SURGERY Right 02/18/2018    Family History  Problem Relation Age of Onset   Hypertension Mother    Hypertension Father    Diabetes Father    Heart disease Maternal Grandmother    Heart disease Paternal Grandmother    Anesthesia problems Neg Hx     Social History   Socioeconomic History   Marital status: Single    Spouse name: Not on file   Number of children: Not on file   Years of education: Not on file   Highest education level: Not on file  Occupational History   Not on file  Tobacco Use   Smoking status: Never   Smokeless tobacco: Never  Vaping Use   Vaping Use: Never used  Substance and Sexual Activity   Alcohol use: No   Drug  use: No   Sexual activity: Yes    Birth control/protection: I.U.D.  Other Topics Concern   Not on file  Social History Narrative   Lives at home.   Social Determinants of Health   Financial Resource Strain: Not on file  Food Insecurity: Not on file  Transportation Needs: Not on file  Physical Activity: Not on file  Stress: Not on file  Social Connections: Not on file  Intimate Partner Violence: Not on file    Outpatient Medications Prior to Visit  Medication Sig Dispense Refill   acetaminophen (TYLENOL) 500 MG tablet Take 1,000 mg by mouth every 8 (eight) hours as needed for  headache.      albuterol (VENTOLIN HFA) 108 (90 Base) MCG/ACT inhaler Inhale 2 puffs into the lungs 3 (three) times daily as needed for wheezing or shortness of breath. 1 each 0   Azelastine HCl 0.15 % SOLN INL 2 PFS IEN QD  12   clobetasol cream (TEMOVATE) 8.09 % Apply 1 application topically 2 (two) times daily. (Patient not taking: Reported on 07/24/2021) 60 g 1   DULoxetine (CYMBALTA) 30 MG capsule Take 1 capsule (30 mg total) by mouth 2 (two) times daily. 60 capsule 3   EPIPEN 2-PAK 0.3 MG/0.3ML SOAJ injection INJECT INTRAMUSCULARLY AS DIRECTED FOR ANAPHYLATIC REACTIONS  0   ergocalciferol (DRISDOL) 1.25 MG (50000 UT) capsule Take 1 capsule (50,000 Units total) by mouth once a week. 4 capsule 5   hydroxychloroquine (PLAQUENIL) 200 MG tablet Take 200 mg by mouth 2 (two) times daily.     hydrOXYzine (ATARAX/VISTARIL) 25 MG tablet Take 1 tablet (25 mg total) by mouth every 8 (eight) hours as needed for anxiety. 90 tablet 1   losartan (COZAAR) 50 MG tablet Take 1 tablet (50 mg total) by mouth daily. 90 tablet 1   ondansetron (ZOFRAN ODT) 8 MG disintegrating tablet Take 1 tablet (8 mg total) by mouth every 8 (eight) hours as needed for nausea or vomiting. 30 tablet 2   potassium chloride (KLOR-CON) 10 MEQ tablet Take 1 tablet (10 mEq total) by mouth daily for 7 days. 7 tablet 0   Rimegepant Sulfate (NURTEC) 75 MG TBDP Take 1 tablet by mouth x 1 dose for acute headache. (Max 75 mg/day) 10 tablet 3   rizatriptan (MAXALT) 10 MG tablet Take 1 tablet (10 mg total) by mouth as needed for migraine. May repeat in 2 hours if needed 10 tablet 0   No facility-administered medications prior to visit.    Allergies  Allergen Reactions   Ibuprofen Swelling    Lips swell   Bean Pod Extract Hives   Corn-Containing Products Hives   Peanut-Containing Drug Products Hives    ROS Review of Systems  Constitutional:  Positive for activity change and fatigue. Negative for appetite change, chills and fever.   HENT:  Positive for congestion, postnasal drip, rhinorrhea, sinus pressure and sinus pain. Negative for sneezing and sore throat.   Eyes: Negative.   Respiratory:  Negative for cough, chest tightness, shortness of breath and wheezing.   Cardiovascular:  Negative for chest pain and palpitations.       Improved blood pressure   Gastrointestinal:  Negative for abdominal pain, constipation, diarrhea, nausea and vomiting.  Endocrine: Negative for cold intolerance, heat intolerance, polydipsia and polyuria.  Genitourinary:  Positive for frequency and urgency. Negative for dyspareunia, dysuria and flank pain.  Musculoskeletal:  Negative for arthralgias, back pain and myalgias.  Skin:  Negative for rash.  Allergic/Immunologic: Positive for  environmental allergies.  Neurological:  Positive for dizziness, speech difficulty, weakness, light-headedness and headaches.  Hematological:  Negative for adenopathy.  Psychiatric/Behavioral:  Positive for dysphoric mood. The patient is nervous/anxious.      Objective:    Physical Exam Vitals and nursing note reviewed.  Constitutional:      Appearance: Normal appearance. She is well-developed. She is obese.     Comments: worried  HENT:     Head: Normocephalic and atraumatic.     Nose: Congestion present.     Mouth/Throat:     Mouth: Mucous membranes are moist.     Pharynx: Oropharynx is clear.  Eyes:     Extraocular Movements: Extraocular movements intact.     Conjunctiva/sclera: Conjunctivae normal.     Pupils: Pupils are equal, round, and reactive to light.  Cardiovascular:     Rate and Rhythm: Normal rate and regular rhythm.     Pulses: Normal pulses.     Heart sounds: Normal heart sounds.  Pulmonary:     Effort: Pulmonary effort is normal.     Breath sounds: Normal breath sounds.  Abdominal:     Palpations: Abdomen is soft.  Musculoskeletal:        General: Normal range of motion.     Cervical back: Normal range of motion and neck supple.   Lymphadenopathy:     Cervical: No cervical adenopathy.  Skin:    General: Skin is warm and dry.     Capillary Refill: Capillary refill takes less than 2 seconds.  Neurological:     General: No focal deficit present.     Mental Status: She is alert and oriented to person, place, and time.  Psychiatric:        Attention and Perception: Attention and perception normal.        Mood and Affect: Mood normal. Affect is tearful.        Speech: Speech normal.        Behavior: Behavior normal. Behavior is cooperative.        Thought Content: Thought content normal.        Cognition and Memory: Cognition and memory normal.        Judgment: Judgment normal.   Today's Vitals   07/24/21 0951  BP: 129/84  Pulse: 64  Temp: 98.2 F (36.8 C)  SpO2: 93%  Weight: 241 lb 12.8 oz (109.7 kg)  Height: $Remove'5\' 10"'hRcYKvT$  (1.778 m)   Body mass index is 34.69 kg/m.   Wt Readings from Last 3 Encounters:  07/24/21 241 lb 12.8 oz (109.7 kg)  07/08/21 241 lb (109.3 kg)  07/01/21 240 lb 1.6 oz (108.9 kg)     Health Maintenance Due  Topic Date Due   TETANUS/TDAP  Never done   COVID-19 Vaccine (4 - Booster) 01/02/2021   INFLUENZA VACCINE  05/13/2021    There are no preventive care reminders to display for this patient.  Lab Results  Component Value Date   TSH 2.890 04/04/2019   Lab Results  Component Value Date   WBC 4.6 06/19/2021   HGB 12.8 06/19/2021   HCT 38.3 06/19/2021   MCV 83 06/19/2021   PLT 438 06/19/2021   Lab Results  Component Value Date   NA 141 06/19/2021   K 4.6 06/19/2021   CO2 22 06/19/2021   GLUCOSE 84 06/19/2021   BUN 8 06/19/2021   CREATININE 1.07 (H) 06/19/2021   BILITOT 0.5 11/08/2020   ALKPHOS 73 11/08/2020   AST 23 11/08/2020   ALT  22 11/08/2020   PROT 7.4 11/08/2020   ALBUMIN 3.7 11/08/2020   CALCIUM 9.8 06/19/2021   ANIONGAP 11 11/08/2020   EGFR 66 06/19/2021   Lab Results  Component Value Date   CHOL 198 04/04/2019   Lab Results  Component Value Date    HDL 86 04/04/2019   Lab Results  Component Value Date   LDLCALC 100 (H) 04/04/2019   Lab Results  Component Value Date   TRIG 60 04/04/2019   Lab Results  Component Value Date   CHOLHDL 2.4 10/12/2015   No results found for: HGBA1C    Assessment & Plan:  1. Pituitary lesion [facility]) Reviewed results of recent MRI.  Does show new lesion adjacent to pituitary gland, new since MRI done in 2017.  could be pituitary macroadenoma or potentially developing pituitary cyst.  Will refer to neurology for further evaluation and treatment.  Extension for [facility] written for today pending neurology consult.  Currently extend FMLA through 08/30/2021, return to work on 09/02/2021.  A note was given to patient reflecting these changes - Ambulatory referral to Neurology  2. White matter disease Of the brain also showing mild, progressive white matter disease since 2017.  Unclear how this is contributing to symptoms.  Refer to neurology for further evaluation. - Ambulatory referral to Neurology  3. Chronic pansinusitis MRI also showing paranasal sinus disease.  Will do trial of doxycycline 100 mg tablets twice daily for 3 weeks.  Reevaluate patient in 4 weeks. - doxycycline (VIBRA-TABS) 100 MG tablet; Take 1 tablet (100 mg total) by mouth 2 (two) times daily.  Dispense: 42 tablet; Refill: 0  4. Intractable migraine with aura without status migrainosus Patient may continue to use rizatriptan and/or Nurtec as needed for acute migraine headaches.  Refilled Maxalt today. - rizatriptan (MAXALT) 10 MG tablet; Take 1 tablet (10 mg total) by mouth as needed for migraine. May repeat in 2 hours if needed  Dispense: 10 tablet; Refill: 2  5. Essential hypertension Blood pressure better controlled.  Continue medications as prescribed.  Problem List Items Addressed This Visit       Cardiovascular and Mediastinum   Essential hypertension   Intractable migraine with aura without status migrainosus   Relevant  Medications   rizatriptan (MAXALT) 10 MG tablet     Respiratory   Chronic pansinusitis   Relevant Medications   doxycycline (VIBRA-TABS) 100 MG tablet     Endocrine   Pituitary lesion ([facility]) - Primary   Relevant Orders   Ambulatory referral to Neurology     Nervous and Auditory   White matter disease   Relevant Orders   Ambulatory referral to Neurology    Meds ordered this encounter  Medications   doxycycline (VIBRA-TABS) 100 MG tablet    Sig: Take 1 tablet (100 mg total) by mouth 2 (two) times daily.    Dispense:  42 tablet    Refill:  0    Order Specific Question:   Supervising Provider    Answer:   Beatrice Lecher D [[id number]]   rizatriptan (MAXALT) 10 MG tablet    Sig: Take 1 tablet (10 mg total) by mouth as needed for migraine. May repeat in 2 hours if needed    Dispense:  10 tablet    Refill:  2    Order Specific Question:   Supervising Provider    Answer:   Beatrice Lecher D [[id number]]   This note was dictated using Dragon Voice Recognition Software. Rapid proofreading was performed to expedite  the delivery of the information. Despite proofreading, phonetic errors will occur which are common with this voice recognition software. Please take this into consideration. If there are any concerns, please contact our office.    Follow-up: Return in about 4 weeks (around 08/21/2021) for headaches, readiness to return to work.    Ronnell Freshwater, NP

## 2021-07-25 ENCOUNTER — Encounter: Payer: Self-pay | Admitting: Neurology

## 2021-07-25 ENCOUNTER — Encounter: Payer: Self-pay | Admitting: Nurse Practitioner

## 2021-07-25 DIAGNOSIS — G43119 Migraine with aura, intractable, without status migrainosus: Secondary | ICD-10-CM

## 2021-07-25 DIAGNOSIS — R9082 White matter disease, unspecified: Secondary | ICD-10-CM

## 2021-08-14 ENCOUNTER — Encounter: Payer: Self-pay | Admitting: Nurse Practitioner

## 2021-08-14 DIAGNOSIS — D352 Benign neoplasm of pituitary gland: Secondary | ICD-10-CM

## 2021-08-14 NOTE — Telephone Encounter (Signed)
You are the best!  Thank you!

## 2021-08-14 NOTE — Telephone Encounter (Signed)
Do we have an endocrinologist we could refer her to in [place]? She has a pituitary microadenoma. If possible, I can put order I for endocrinology referral. Thanks.

## 2021-08-28 ENCOUNTER — Ambulatory Visit (INDEPENDENT_AMBULATORY_CARE_PROVIDER_SITE_OTHER): Payer: BC Managed Care – PPO | Admitting: Nurse Practitioner

## 2021-08-28 ENCOUNTER — Encounter: Payer: Self-pay | Admitting: Nurse Practitioner

## 2021-08-28 ENCOUNTER — Other Ambulatory Visit: Payer: Self-pay

## 2021-08-28 VITALS — BP 169/102 | HR 68 | Temp 98.5°F | Ht 70.0 in | Wt 241.9 lb

## 2021-08-28 DIAGNOSIS — D352 Benign neoplasm of pituitary gland: Secondary | ICD-10-CM

## 2021-08-28 DIAGNOSIS — R5383 Other fatigue: Secondary | ICD-10-CM | POA: Diagnosis not present

## 2021-08-28 DIAGNOSIS — E876 Hypokalemia: Secondary | ICD-10-CM

## 2021-08-28 DIAGNOSIS — I10 Essential (primary) hypertension: Secondary | ICD-10-CM

## 2021-08-28 DIAGNOSIS — G43119 Migraine with aura, intractable, without status migrainosus: Secondary | ICD-10-CM | POA: Diagnosis not present

## 2021-08-28 DIAGNOSIS — Z6835 Body mass index (BMI) 35.0-35.9, adult: Secondary | ICD-10-CM | POA: Insufficient documentation

## 2021-08-28 DIAGNOSIS — N921 Excessive and frequent menstruation with irregular cycle: Secondary | ICD-10-CM | POA: Diagnosis not present

## 2021-08-28 DIAGNOSIS — E538 Deficiency of other specified B group vitamins: Secondary | ICD-10-CM

## 2021-08-28 DIAGNOSIS — Z6833 Body mass index (BMI) 33.0-33.9, adult: Secondary | ICD-10-CM | POA: Insufficient documentation

## 2021-08-28 DIAGNOSIS — Z6834 Body mass index (BMI) 34.0-34.9, adult: Secondary | ICD-10-CM | POA: Insufficient documentation

## 2021-08-28 MED ORDER — ATENOLOL 25 MG PO TABS: 12.5000 mg | ORAL_TABLET | Freq: Every day | ORAL | 3 refills | Status: DC

## 2021-08-28 NOTE — Progress Notes (Signed)
Established Patient Office Visit  Subjective:  Patient ID: Linda Vargas, female    DOB: 03/15/1976  Age: 45 y.o. MRN: [id number]  CC:  Chief Complaint  Patient presents with   Migraine    HPI [name] presents for follow up of migraine headaches  saw neurologist on 08/07/2021. Her maxalt was stopped. It was suggested she might be overusing the medication could be leading to worsening migraine headaches. She was started on nortriptyline 15m daily to reduce frequency and severity of headaches. Her maxalt was also changed to relpax 484mtablets. The patient states that she has been taking the nortriptyline dailiy. She feels like migraines have become more severe. She has about four headache days per week. She states that she does have relpax but has not taken any of them. She states that migraines are accompanied by worsening nausea and now vomiting as well.  Blood pressure is very elevated. She states it was also very elevated when she saw neurology.  Menstrual cycle very irregular. She states that she has been bleeding for nearly one full month. She states that fatigue is overwhelming and she feels cold often.    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   Fibromyalgia    Hypertension    Lupus ([place]   Migraine    Seasonal allergic rhinitis     Past Surgical History:  Procedure Laterality Date   CHOLECYSTECTOMY  2012   DILATION AND CURETTAGE OF UTERUS  2011   DILATION AND CURETTAGE OF UTERUS  01/17/2012   Procedure: DILATATION AND CURETTAGE;  Surgeon: GrMarylynn PearsonMD;  [facility];  Service: Gynecology;  Laterality: N/A;  Dilatation and curratage, insertion of Bakri Balloon   FOOT SURGERY     HARDWARE REMOVAL Right 02/18/2018   Procedure: HARDWARE REMOVAL RIGHT WRIST;  Surgeon: MeHessie KnowsMD;  Location: [facility];  Service: Orthopedics;  Laterality: Right;   OPEN REDUCTION INTERNAL FIXATION  (ORIF) DISTAL RADIAL FRACTURE Right 11/26/2017   Procedure: OPEN REDUCTION INTERNAL FIXATION (ORIF) DISTAL RADIAL FRACTURE;  Surgeon: MeHessie KnowsMD;  Location: [facility];  Service: Orthopedics;  Laterality: Right;   TONSILLECTOMY     WRIST SURGERY Right 02/18/2018    Family History  Problem Relation Age of Onset   Hypertension Mother    Hypertension Father    Diabetes Father    Heart disease Maternal Grandmother    Heart disease Paternal Grandmother    Anesthesia problems Neg Hx     Social History   Socioeconomic History   Marital status: Single    Spouse name: Not on file   Number of children: Not on file   Years of education: Not on file   Highest education level: Not on file  Occupational History   Not on file  Tobacco Use   Smoking status: Never   Smokeless tobacco: Never  Vaping Use   Vaping Use: Never used  Substance and Sexual Activity   Alcohol use: No   Drug use: No   Sexual activity: Yes    Birth control/protection: I.U.D.  Other Topics Concern   Not on file  Social History Narrative   Lives at home.   Social Determinants of Health   Financial Resource Strain: Not on file  Food Insecurity: Not on file  Transportation Needs: Not on file  Physical Activity: Not on file  Stress: Not on file  Social Connections: Not  on file  Intimate Partner Violence: Not on file    Outpatient Medications Prior to Visit  Medication Sig Dispense Refill   acetaminophen (TYLENOL) 500 MG tablet Take 1,000 mg by mouth every 8 (eight) hours as needed for headache.      albuterol (VENTOLIN HFA) 108 (90 Base) MCG/ACT inhaler Inhale 2 puffs into the lungs 3 (three) times daily as needed for wheezing or shortness of breath. 1 each 0   Azelastine HCl 0.15 % SOLN INL 2 PFS IEN QD  12   doxycycline (VIBRA-TABS) 100 MG tablet Take 1 tablet (100 mg total) by mouth 2 (two) times daily. 42 tablet 0   DULoxetine (CYMBALTA) 30 MG capsule Take 1 capsule (30 mg total) by mouth 2 (two)  times daily. 60 capsule 3   EPIPEN 2-PAK 0.3 MG/0.3ML SOAJ injection INJECT INTRAMUSCULARLY AS DIRECTED FOR ANAPHYLATIC REACTIONS  0   ergocalciferol (DRISDOL) 1.25 MG (50000 UT) capsule Take 1 capsule (50,000 Units total) by mouth once a week. 4 capsule 5   hydroxychloroquine (PLAQUENIL) 200 MG tablet Take 200 mg by mouth 2 (two) times daily.     hydrOXYzine (ATARAX/VISTARIL) 25 MG tablet Take 1 tablet (25 mg total) by mouth every 8 (eight) hours as needed for anxiety. 90 tablet 1   losartan (COZAAR) 50 MG tablet Take 1 tablet (50 mg total) by mouth daily. 90 tablet 1   ondansetron (ZOFRAN ODT) 8 MG disintegrating tablet Take 1 tablet (8 mg total) by mouth every 8 (eight) hours as needed for nausea or vomiting. 30 tablet 2   Rimegepant Sulfate (NURTEC) 75 MG TBDP Take 1 tablet by mouth x 1 dose for acute headache. (Max 75 mg/day) 10 tablet 3   rizatriptan (MAXALT) 10 MG tablet Take 1 tablet (10 mg total) by mouth as needed for migraine. May repeat in 2 hours if needed 10 tablet 2   clobetasol cream (TEMOVATE) 0.99 % Apply 1 application topically 2 (two) times daily. (Patient not taking: No sig reported) 60 g 1   potassium chloride (KLOR-CON) 10 MEQ tablet Take 1 tablet (10 mEq total) by mouth daily for 7 days. 7 tablet 0   No facility-administered medications prior to visit.    Allergies  Allergen Reactions   Ibuprofen Swelling    Lips swell   Bean Pod Extract Hives   Corn-Containing Products Hives   Peanut-Containing Drug Products Hives    ROS Review of Systems  Constitutional:  Positive for activity change and fatigue. Negative for appetite change, chills and fever.  HENT:  Negative for congestion, postnasal drip, rhinorrhea, sinus pressure, sinus pain, sneezing and sore throat.   Eyes: Negative.   Respiratory:  Negative for cough, chest tightness, shortness of breath and wheezing.   Cardiovascular:  Negative for chest pain and palpitations.       Moderately elevated blood pressure  today   Gastrointestinal:  Positive for nausea and vomiting. Negative for abdominal pain, constipation and diarrhea.  Endocrine: Negative for cold intolerance, heat intolerance, polydipsia and polyuria.  Genitourinary:  Positive for menstrual problem. Negative for dyspareunia, dysuria, flank pain, frequency and urgency.  Musculoskeletal:  Negative for arthralgias, back pain and myalgias.  Skin:  Negative for rash.  Allergic/Immunologic: Negative for environmental allergies.  Neurological:  Positive for headaches. Negative for dizziness and weakness.  Hematological:  Negative for adenopathy.  Psychiatric/Behavioral:  Positive for dysphoric mood. The patient is nervous/anxious.      Objective:    Physical Exam Vitals and nursing note reviewed.  Constitutional:      Appearance: Normal appearance. She is well-developed. She is obese. She is ill-appearing.  HENT:     Head: Normocephalic and atraumatic.     Nose: Nose normal.     Mouth/Throat:     Mouth: Mucous membranes are moist.  Eyes:     Extraocular Movements: Extraocular movements intact.     Conjunctiva/sclera: Conjunctivae normal.     Pupils: Pupils are equal, round, and reactive to light.  Cardiovascular:     Rate and Rhythm: Normal rate and regular rhythm.     Pulses: Normal pulses.     Heart sounds: Normal heart sounds.  Pulmonary:     Effort: Pulmonary effort is normal.     Breath sounds: Normal breath sounds.  Abdominal:     Palpations: Abdomen is soft.  Musculoskeletal:        General: Normal range of motion.     Cervical back: Normal range of motion and neck supple.  Lymphadenopathy:     Cervical: No cervical adenopathy.  Skin:    General: Skin is warm and dry.     Capillary Refill: Capillary refill takes less than 2 seconds.  Neurological:     General: No focal deficit present.     Mental Status: She is alert and oriented to person, place, and time.  Psychiatric:        Attention and Perception: Attention and  perception normal.        Mood and Affect: Mood is anxious and depressed.        Speech: Speech normal.        Behavior: Behavior normal. Behavior is cooperative.        Thought Content: Thought content normal.        Cognition and Memory: Cognition and memory normal.        Judgment: Judgment normal.    Today's Vitals   08/28/21 1108  BP: (!) 169/102  Pulse: 68  Temp: 98.5 F (36.9 C)  SpO2: 100%  Weight: 241 lb 14.4 oz (109.7 kg)  Height: _0  (1.778 m)   Body mass index is 34.71 kg/m.   Wt Readings from Last 3 Encounters:  08/28/21 241 lb 14.4 oz (109.7 kg)  07/24/21 241 lb 12.8 oz (109.7 kg)  07/08/21 241 lb (109.3 kg)     Health Maintenance Due  Topic Date Due   TETANUS/TDAP  Never done   COVID-19 Vaccine (4 - Booster) 12/05/2020   INFLUENZA VACCINE  05/13/2021    There are no preventive care reminders to display for this patient.  Lab Results  Component Value Date   TSH 2.890 04/04/2019   Lab Results  Component Value Date   WBC 4.6 06/19/2021   HGB 12.8 06/19/2021   HCT 38.3 06/19/2021   MCV 83 06/19/2021   PLT 438 06/19/2021   Lab Results  Component Value Date   NA 141 06/19/2021   K 4.6 06/19/2021   CO2 22 06/19/2021   GLUCOSE 84 06/19/2021   BUN 8 06/19/2021   CREATININE 1.07 (H) 06/19/2021   BILITOT 0.5 11/08/2020   ALKPHOS 73 11/08/2020   AST 23 11/08/2020   ALT 22 11/08/2020   PROT 7.4 11/08/2020   ALBUMIN 3.7 11/08/2020   CALCIUM 9.8 06/19/2021   ANIONGAP 11 11/08/2020   EGFR 66 06/19/2021   Lab Results  Component Value Date   CHOL 198 04/04/2019   Lab Results  Component Value Date   HDL 86 04/04/2019   Lab Results  Component Value Date   LDLCALC 100 (H) 04/04/2019   Lab Results  Component Value Date   TRIG 60 04/04/2019   Lab Results  Component Value Date   CHOLHDL 2.4 10/12/2015   No results found for: HGBA1C    Assessment & Plan:  1. Essential hypertension Add atenolol 12.55m at bedtime to reduce blood  pressure and improve migraines. Continue other bp medication as prescribed. Advised her to keep log of blood pressure at home. The goal is to get blood pressure around 140/80 or better.  - atenolol (TENORMIN) 25 MG tablet; Take 0.5 tablets (12.5 mg total) by mouth daily.  Dispense: 45 tablet; Refill: 3  2. Intractable migraine with aura without status migrainosus Reviewed progress note from neurology consultation. She should continue all medication as prescribed. Recommend she add otc supplementation of magnesium and B2. Advised her to keep follow up with neurology as scheduled.  FMLA leave to be extended through 10/18/2020, having the patient go back to work on 10/21/2020. A work note was given to the patient today and additional paperwork will be completed and returned to patient at a later date.   3. Menorrhagia with irregular cycle Unclear cause. May be related to pituitary microadenoma. Will check labs, including thyroid and anemia panels. Will also check reproductive hormones.  - Comp Met (CMET); Future - CBC with Differential/Platelet; Future - Ferritin; Future - B12; Future - Prolactin; Future - Estradiol; Future - FSH; Future - LH; Future - Lipid panel; Future - TSH + free T4; Future - Lipid panel - TSH + free T4 - LH - FSH - Estradiol - Prolactin - B12 - Ferritin - CBC with Differential/Platelet - Comp Met (CMET)  4. Fatigue, unspecified type Check thyroid panel for further evaluation.  - TSH + free T4; Future - TSH + free T4  5. Pituitary microadenoma ([facility] Keep initial appointment with endocrinologist as scheduled.   6. Vitamin B12 deficiency Check vitamin b12 and anemia panels.  - B12; Future - B12  7. Hypokalemia Check bmp  - Basic Metabolic Panel (BMET)  8. Body mass index (BMI) of 34.0-34.9 in adult Discussed lowering calorie intake to 1500 calories per day and incorporating exercise into daily routine to help lose weight. Will monitor.     Problem List  Items Addressed This Visit       Cardiovascular and Mediastinum   Essential hypertension - Primary   Relevant Medications   atenolol (TENORMIN) 25 MG tablet   Intractable migraine with aura without status migrainosus   Relevant Medications   atenolol (TENORMIN) 25 MG tablet     Endocrine   Pituitary microadenoma (HCC)     Other   Fatigue   Relevant Orders   TSH + free T4   Vitamin B12 deficiency   Relevant Orders   B12   Hypokalemia   Menorrhagia with irregular cycle   Relevant Orders   Comp Met (CMET)   CBC with Differential/Platelet   Ferritin   B12   Prolactin   Estradiol   FSH   LH   Lipid panel   TSH + free T4   Body mass index (BMI) of 34.0-34.9 in adult       Follow-up: Return in about 6 weeks (around 10/09/2021) for blood pressure and headaches .    HRonnell Freshwater NP

## 2021-08-28 NOTE — Patient Instructions (Addendum)
Per our conversation today: Take nortriptyline 10mg  every evening.  Take Relpax at onset of headache. You can take a second dose of this after 2 hours. You should not exceed 80mg  of Relpax in 24 hour period.  I added atenolol 12.5mg  (1/2 tablet of atenolol 25mg  tablet) to help with blood pressure and migraine headaches. Take this at night.  Magnesium may help the headaches. It comes in 200mg  capsules. I recommend you take this twice daily.  Vitamin B2 (riboflavin) may also help reduce the headaches. This is OTC supplement. I recommend one dose daily.  We are checking labs today and I will be in touch with you about these results as soon as possible.  Work restrictions have been extended through 10/18/2021 having you return on 10/21/2021. I will see you back around 10/09/2021 for further evaluation.  Let me know if you have any questions

## 2021-08-29 LAB — PROLACTIN: Prolactin: 12.1 ng/mL (ref 4.8–23.3)

## 2021-08-29 LAB — COMPREHENSIVE METABOLIC PANEL
ALT: 15 IU/L (ref 0–32)
AST: 21 IU/L (ref 0–40)
Albumin/Globulin Ratio: 1.2 (ref 1.2–2.2)
Albumin: 4.2 g/dL (ref 3.8–4.8)
Alkaline Phosphatase: 113 IU/L (ref 44–121)
BUN/Creatinine Ratio: 11 (ref 9–23)
BUN: 11 mg/dL (ref 6–24)
Bilirubin Total: 0.3 mg/dL (ref 0.0–1.2)
CO2: 22 mmol/L (ref 20–29)
Calcium: 9.6 mg/dL (ref 8.7–10.2)
Chloride: 105 mmol/L (ref 96–106)
Creatinine, Ser: 0.96 mg/dL (ref 0.57–1.00)
Globulin, Total: 3.4 g/dL (ref 1.5–4.5)
Glucose: 58 mg/dL — ABNORMAL LOW (ref 70–99)
Potassium: 5.2 mmol/L (ref 3.5–5.2)
Sodium: 141 mmol/L (ref 134–144)
Total Protein: 7.6 g/dL (ref 6.0–8.5)
eGFR: 75 mL/min/{1.73_m2} (ref >59)

## 2021-08-29 LAB — CBC WITH DIFFERENTIAL/PLATELET
Basophils Absolute: 0 10*3/uL (ref 0.0–0.2)
Basos: 0 %
EOS (ABSOLUTE): 0.3 10*3/uL (ref 0.0–0.4)
Eos: 6 %
Hematocrit: 38 % (ref 34.0–46.6)
Hemoglobin: 12.9 g/dL (ref 11.1–15.9)
Immature Grans (Abs): 0 10*3/uL (ref 0.0–0.1)
Immature Granulocytes: 0 %
Lymphocytes Absolute: 1.5 10*3/uL (ref 0.7–3.1)
Lymphs: 31 %
MCH: 27.9 pg (ref 26.6–33.0)
MCHC: 33.9 g/dL (ref 31.5–35.7)
MCV: 82 fL (ref 79–97)
Monocytes Absolute: 0.3 10*3/uL (ref 0.1–0.9)
Monocytes: 7 %
Neutrophils Absolute: 2.7 10*3/uL (ref 1.4–7.0)
Neutrophils: 56 %
Platelets: 452 10*3/uL — ABNORMAL HIGH (ref 150–450)
RBC: 4.62 x10E6/uL (ref 3.77–5.28)
RDW: 12.2 % (ref 11.7–15.4)
WBC: 4.8 10*3/uL (ref 3.4–10.8)

## 2021-08-29 LAB — LIPID PANEL
Chol/HDL Ratio: 2.5 ratio (ref 0.0–4.4)
Cholesterol, Total: 201 mg/dL — ABNORMAL HIGH (ref 100–199)
HDL: 79 mg/dL (ref >39)
LDL Chol Calc (NIH): 112 mg/dL — ABNORMAL HIGH (ref 0–99)
Triglycerides: 53 mg/dL (ref 0–149)
VLDL Cholesterol Cal: 10 mg/dL (ref 5–40)

## 2021-08-29 LAB — FOLLICLE STIMULATING HORMONE: FSH: 14.3 m[IU]/mL

## 2021-08-29 LAB — LUTEINIZING HORMONE: LH: 11.8 m[IU]/mL

## 2021-08-29 LAB — TSH+FREE T4
Free T4: 1.25 ng/dL (ref 0.82–1.77)
TSH: 2.97 u[IU]/mL (ref 0.450–4.500)

## 2021-08-29 LAB — ESTRADIOL: Estradiol: 43.7 pg/mL

## 2021-08-29 LAB — FERRITIN: Ferritin: 69 ng/mL (ref 15–150)

## 2021-08-29 LAB — VITAMIN B12: Vitamin B-12: 373 pg/mL (ref 232–1245)

## 2021-09-06 ENCOUNTER — Other Ambulatory Visit: Payer: Self-pay | Admitting: Nurse Practitioner

## 2021-09-06 DIAGNOSIS — I10 Essential (primary) hypertension: Secondary | ICD-10-CM

## 2021-09-09 ENCOUNTER — Encounter: Payer: Self-pay | Admitting: Nurse Practitioner

## 2021-09-09 ENCOUNTER — Encounter: Payer: Self-pay | Admitting: Physician Assistant

## 2021-09-09 NOTE — Progress Notes (Signed)
12 low normal and LDL and total cholesterol levels just slightly elevated. Will monitor. MyChart Message sent to patient.

## 2021-09-13 NOTE — Telephone Encounter (Signed)
Do we charge to complete paperwork for disability?

## 2021-09-16 NOTE — Telephone Encounter (Signed)
Katrina, can you print these forms from your completer? I cannot from the laptop.

## 2021-09-24 ENCOUNTER — Encounter: Payer: Self-pay | Admitting: Nurse Practitioner

## 2021-09-28 DIAGNOSIS — Z87440 Personal history of urinary (tract) infections: Secondary | ICD-10-CM | POA: Insufficient documentation

## 2021-10-08 ENCOUNTER — Encounter: Payer: Self-pay | Admitting: Nurse Practitioner

## 2021-10-09 ENCOUNTER — Encounter: Payer: Self-pay | Admitting: Nurse Practitioner

## 2021-10-09 ENCOUNTER — Ambulatory Visit (INDEPENDENT_AMBULATORY_CARE_PROVIDER_SITE_OTHER): Payer: BC Managed Care – PPO | Admitting: Nurse Practitioner

## 2021-10-09 ENCOUNTER — Other Ambulatory Visit: Payer: Self-pay

## 2021-10-09 VITALS — BP 125/82 | HR 90 | Temp 98.7°F | Ht 70.0 in | Wt 243.7 lb

## 2021-10-09 DIAGNOSIS — Z6834 Body mass index (BMI) 34.0-34.9, adult: Secondary | ICD-10-CM

## 2021-10-09 DIAGNOSIS — I10 Essential (primary) hypertension: Secondary | ICD-10-CM

## 2021-10-09 DIAGNOSIS — R52 Pain, unspecified: Secondary | ICD-10-CM

## 2021-10-09 DIAGNOSIS — R112 Nausea with vomiting, unspecified: Secondary | ICD-10-CM | POA: Diagnosis not present

## 2021-10-09 DIAGNOSIS — G43119 Migraine with aura, intractable, without status migrainosus: Secondary | ICD-10-CM | POA: Diagnosis not present

## 2021-10-09 LAB — POCT INFLUENZA A/B
Influenza A, POC: NEGATIVE
Influenza B, POC: NEGATIVE

## 2021-10-09 NOTE — Progress Notes (Signed)
Established Patient Office Visit  Subjective:  Patient ID: Linda Vargas, female    DOB: April 17, 1976  Age: 45 y.o. MRN: [id number]  CC:  Chief Complaint  Patient presents with   Follow-up    HPI Linda Vargas presents for follow up visit. She states that she has been having stomach pain and intestinal cramps. She has had several episodes of diarrhea. She has decreased appetite and very low energy. Symptoms started yesterday, very early in the morning. Initially felt like menstrual cycle. Had diarrhea immediately. Had a few episodes of vomiting. Has not had vomiting since yesterday morning. Does have very sour taste in her mouth. She did take a home test for COVID 19 yesterday. The test results were negative. She states that she did have a fever yesterday. She took zofran to help with nausea. Has not taken anything for diarrhea. She has had several family members with similar symptoms. They have seen pediatrician. Were diagnosed with GI virus.  The patient has seen headache specialist. The patient's atenolol was increased 51m daily. Bp is much better headaches continue to be daily. Neurologist also increased dose of nortriptyline. This was on 10/01/2021. Somedays they are slightly less severe.  She is scheduled to go back to see neurologist on 11/01/2021. She will have eye appointment tomorrow to evaluate for intracranial hypertension. She will also see endocrinologist on 10/18/2021. As of right now, she is scheduled to go back to work on October 21, 2021, however, follow up visits are not scheduled until after that. Will extend FMLA through 11/15/2021, returning to work on 11/18/2021.   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   Fibromyalgia    Hypertension    Lupus ([facility]    Migraine    Seasonal allergic rhinitis     Past Surgical History:  Procedure Laterality Date   CHOLECYSTECTOMY  2012   DILATION AND CURETTAGE OF  UTERUS  2011   DILATION AND CURETTAGE OF UTERUS  01/17/2012   Procedure: DILATATION AND CURETTAGE;  Surgeon: GMarylynn Pearson MD;  [facility];  Service: Gynecology;  Laterality: N/A;  Dilatation and curratage, insertion of Bakri Balloon   FOOT SURGERY     HARDWARE REMOVAL Right 02/18/2018   Procedure: HARDWARE REMOVAL RIGHT WRIST;  Surgeon: MHessie Knows MD;  Location: [facility];  Service: Orthopedics;  Laterality: Right;   OPEN REDUCTION INTERNAL FIXATION (ORIF) DISTAL RADIAL FRACTURE Right 11/26/2017   Procedure: OPEN REDUCTION INTERNAL FIXATION (ORIF) DISTAL RADIAL FRACTURE;  Surgeon: MHessie Knows MD;  Location: [facility];  Service: Orthopedics;  Laterality: Right;   TONSILLECTOMY     WRIST SURGERY Right 02/18/2018    Family History  Problem Relation Age of Onset   Hypertension Mother    Hypertension Father    Diabetes Father    Heart disease Maternal Grandmother    Heart disease Paternal Grandmother    Anesthesia problems Neg Hx     Social History   Socioeconomic History   Marital status: Single    Spouse name: Not on file   Number of children: Not on file   Years of education: Not on file   Highest education level: Not on file  Occupational History   Not on file  Tobacco Use   Smoking status: Never   Smokeless tobacco: Never  Vaping Use   Vaping Use: Never used  Substance and Sexual Activity   Alcohol use: No   Drug  use: No   Sexual activity: Yes    Birth control/protection: I.U.D.  Other Topics Concern   Not on file  Social History Narrative   Lives at home.   Social Determinants of Health   Financial Resource Strain: Not on file  Food Insecurity: Not on file  Transportation Needs: Not on file  Physical Activity: Not on file  Stress: Not on file  Social Connections: Not on file  Intimate Partner Violence: Not on file    Outpatient Medications Prior to Visit  Medication Sig Dispense Refill   acetaminophen (TYLENOL) 500 MG tablet Take 1,000 mg by  mouth every 8 (eight) hours as needed for headache.      albuterol (VENTOLIN HFA) 108 (90 Base) MCG/ACT inhaler Inhale 2 puffs into the lungs 3 (three) times daily as needed for wheezing or shortness of breath. 1 each 0   atenolol (TENORMIN) 25 MG tablet Take 0.5 tablets (12.5 mg total) by mouth daily. 45 tablet 3   Azelastine HCl 0.15 % SOLN INL 2 PFS IEN QD  12   doxycycline (VIBRA-TABS) 100 MG tablet Take 1 tablet (100 mg total) by mouth 2 (two) times daily. 42 tablet 0   DULoxetine (CYMBALTA) 30 MG capsule Take 1 capsule (30 mg total) by mouth 2 (two) times daily. 60 capsule 3   EPIPEN 2-PAK 0.3 MG/0.3ML SOAJ injection INJECT INTRAMUSCULARLY AS DIRECTED FOR ANAPHYLATIC REACTIONS  0   ergocalciferol (DRISDOL) 1.25 MG (50000 UT) capsule Take 1 capsule (50,000 Units total) by mouth once a week. 4 capsule 5   hydroxychloroquine (PLAQUENIL) 200 MG tablet Take 200 mg by mouth 2 (two) times daily.     hydrOXYzine (ATARAX/VISTARIL) 25 MG tablet Take 1 tablet (25 mg total) by mouth every 8 (eight) hours as needed for anxiety. 90 tablet 1   losartan (COZAAR) 50 MG tablet TAKE 1 TABLET(50 MG) BY MOUTH DAILY 90 tablet 1   ondansetron (ZOFRAN ODT) 8 MG disintegrating tablet Take 1 tablet (8 mg total) by mouth every 8 (eight) hours as needed for nausea or vomiting. 30 tablet 2   Rimegepant Sulfate (NURTEC) 75 MG TBDP Take 1 tablet by mouth x 1 dose for acute headache. (Max 75 mg/day) 10 tablet 3   rizatriptan (MAXALT) 10 MG tablet Take 1 tablet (10 mg total) by mouth as needed for migraine. May repeat in 2 hours if needed 10 tablet 2   clobetasol cream (TEMOVATE) 2.63 % Apply 1 application topically 2 (two) times daily. (Patient not taking: Reported on 07/24/2021) 60 g 1   potassium chloride (KLOR-CON) 10 MEQ tablet Take 1 tablet (10 mEq total) by mouth daily for 7 days. 7 tablet 0   No facility-administered medications prior to visit.    Allergies  Allergen Reactions   Ibuprofen Swelling    Lips swell    Bean Pod Extract Hives   Corn-Containing Products Hives   Peanut-Containing Drug Products Hives    ROS Review of Systems  Constitutional:  Positive for activity change and fatigue. Negative for appetite change, chills and fever.  HENT:  Negative for congestion, postnasal drip, rhinorrhea, sinus pressure, sinus pain, sneezing and sore throat.   Eyes: Negative.   Respiratory:  Negative for cough, chest tightness, shortness of breath and wheezing.   Cardiovascular:  Negative for chest pain and palpitations.       Blood pressure much improved since most recent visit   Gastrointestinal:  Positive for diarrhea, nausea and vomiting. Negative for abdominal pain and constipation.  Endocrine:  Negative for cold intolerance, heat intolerance, polydipsia and polyuria.  Genitourinary:  Negative for dyspareunia, dysuria, flank pain, frequency, menstrual problem and urgency.  Musculoskeletal:  Positive for arthralgias and myalgias. Negative for back pain.  Skin:  Negative for rash.  Allergic/Immunologic: Negative for environmental allergies.  Neurological:  Positive for headaches. Negative for dizziness and weakness.       Patient voiced mild improvement of headaches since lat visit with neurology.   Hematological:  Negative for adenopathy.  Psychiatric/Behavioral:  Positive for dysphoric mood. The patient is nervous/anxious.      Objective:    Physical Exam Vitals and nursing note reviewed.  Constitutional:      Appearance: Normal appearance. She is well-developed. She is obese. She is ill-appearing.  HENT:     Head: Normocephalic and atraumatic.     Nose: Nose normal.     Mouth/Throat:     Mouth: Mucous membranes are moist.  Eyes:     Extraocular Movements: Extraocular movements intact.     Conjunctiva/sclera: Conjunctivae normal.     Pupils: Pupils are equal, round, and reactive to light.  Cardiovascular:     Rate and Rhythm: Normal rate and regular rhythm.     Pulses: Normal pulses.      Heart sounds: Normal heart sounds.  Pulmonary:     Effort: Pulmonary effort is normal.     Breath sounds: Normal breath sounds.  Abdominal:     General: Bowel sounds are normal. There is no distension.     Palpations: Abdomen is soft. There is no mass.     Tenderness: There is abdominal tenderness. There is no guarding or rebound.     Hernia: No hernia is present.     Comments: Mild, generalized abdominal tenderness noted.   Musculoskeletal:        General: Normal range of motion.     Cervical back: Normal range of motion and neck supple.  Lymphadenopathy:     Cervical: No cervical adenopathy.  Skin:    General: Skin is warm and dry.     Capillary Refill: Capillary refill takes less than 2 seconds.  Neurological:     General: No focal deficit present.     Mental Status: She is alert and oriented to person, place, and time.  Psychiatric:        Attention and Perception: Attention and perception normal.        Mood and Affect: Mood is anxious and depressed.        Speech: Speech normal.        Behavior: Behavior normal. Behavior is cooperative.        Thought Content: Thought content normal.        Cognition and Memory: Cognition and memory normal.        Judgment: Judgment normal.    Today's Vitals   10/09/21 1628  BP: 125/82  Pulse: 90  Temp: 98.7 F (37.1 C)  SpO2: 99%  Weight: 243 lb 11.2 oz (110.5 kg)  Height: 5' 10"  (1.778 m)   Body mass index is 34.97 kg/m.   Wt Readings from Last 3 Encounters:  10/09/21 243 lb 11.2 oz (110.5 kg)  08/28/21 241 lb 14.4 oz (109.7 kg)  07/24/21 241 lb 12.8 oz (109.7 kg)     Health Maintenance Due  Topic Date Due   TETANUS/TDAP  Never done   COVID-19 Vaccine (5 - Booster) 02/25/2021   INFLUENZA VACCINE  05/13/2021    There are no preventive care reminders to  display for this patient.  Lab Results  Component Value Date   TSH 2.970 08/28/2021   Lab Results  Component Value Date   WBC 4.8 08/28/2021   HGB 12.9  08/28/2021   HCT 38.0 08/28/2021   MCV 82 08/28/2021   PLT 452 (H) 08/28/2021   Lab Results  Component Value Date   NA 141 08/28/2021   K 5.2 08/28/2021   CO2 22 08/28/2021   GLUCOSE 58 (L) 08/28/2021   BUN 11 08/28/2021   CREATININE 0.96 08/28/2021   BILITOT 0.3 08/28/2021   ALKPHOS 113 08/28/2021   AST 21 08/28/2021   ALT 15 08/28/2021   PROT 7.6 08/28/2021   ALBUMIN 4.2 08/28/2021   CALCIUM 9.6 08/28/2021   ANIONGAP 11 11/08/2020   EGFR 75 08/28/2021   Lab Results  Component Value Date   CHOL 201 (H) 08/28/2021   Lab Results  Component Value Date   HDL 79 08/28/2021   Lab Results  Component Value Date   LDLCALC 112 (H) 08/28/2021   Lab Results  Component Value Date   TRIG 53 08/28/2021   Lab Results  Component Value Date   CHOLHDL 2.5 08/28/2021      Assessment & Plan:   1. Essential hypertension Blood pressure improved since most recent visit.  Continue with increased dose of atenolol and losartan as prescribed.  2. Intractable migraine with aura without status migrainosus Patient now seeing neurology for surveillance and treatment.  Update medications to reflect changes made..  We will update FMLA paperwork to keep patient out through 11/15/2021 and returning to work on 11/18/2021.  3. Nausea and vomiting, unspecified vomiting type PCR test for COVID-19 was performed during today's visit and results were negative.  Recommend BRAT diet and advance - Novel Coronavirus, NAA ([facility])  4. Body aches Rapid flu test done during today's visit and it was negative.  Recommend Tylenol and/or ibuprofen for pain and fever.  Rest and increase fluids. - POCT Influenza A/B  5. Body mass index (BMI) of 34.0-34.9 in adult Discussed lowering calorie intake to 1500 calories per day and incorporating exercise into daily routine to help lose weight.    Problem List Items Addressed This Visit       Cardiovascular and Mediastinum   Essential hypertension - Primary    Intractable migraine with aura without status migrainosus     Digestive   Nausea and vomiting   Relevant Orders   Novel Coronavirus, NAA ([facility]) (Completed)     Other   Body mass index (BMI) of 34.0-34.9 in adult   Body aches   Relevant Orders   POCT Influenza A/B (Completed)     Follow-up: Return in about 4 weeks (around 11/06/2021) for migraines/blood pressure .    Ronnell Freshwater, NP

## 2021-10-10 ENCOUNTER — Encounter: Payer: Self-pay | Admitting: Nurse Practitioner

## 2021-10-10 LAB — SARS-COV-2, NAA 2 DAY TAT

## 2021-10-10 LAB — NOVEL CORONAVIRUS, NAA: SARS-CoV-2, NAA: NOT DETECTED

## 2021-10-10 NOTE — Progress Notes (Signed)
Neative covid test. MyChart message sent to patient .

## 2021-10-14 DIAGNOSIS — R52 Pain, unspecified: Secondary | ICD-10-CM | POA: Insufficient documentation

## 2021-10-14 NOTE — Patient Instructions (Signed)
Fat and Cholesterol Restricted Eating Plan Getting too much fat and cholesterol in your diet may cause health problems. Choosing the right foods helps keep your fat and cholesterol at normal levels. This can keep you from getting certain diseases. Your doctor may recommend an eating plan that includes: Total fat: ______% or less of total calories a day. This is ______g of fat a day. Saturated fat: ______% or less of total calories a day. This is ______g of saturated fat a day. Cholesterol: less than _________mg a day. Fiber: ______g a day. What are tips for following this plan? General tips Work with your doctor to lose weight if you need to. Avoid: Foods with added sugar. Fried foods. Foods with trans fat or partially hydrogenated oils. This includes some margarines and baked goods. If you drink alcohol: Limit how much you have to: 0-1 drink a day for women who are not pregnant. 0-2 drinks a day for men. Know how much alcohol is in a drink. In the [place] one drink equals one 12 oz bottle of beer (355 mL), one 5 oz glass of wine (148 mL), or one 1 oz glass of hard liquor (44 mL). Reading food labels Check food labels for: Trans fats. Partially hydrogenated oils. Saturated fat (g) in each serving. Cholesterol (mg) in each serving. Fiber (g) in each serving. Choose foods with healthy fats, such as: Monounsaturated fats and polyunsaturated fats. These include olive and canola oil, flaxseeds, walnuts, almonds, and seeds. Omega-3 fats. These are found in certain fish, flaxseed oil, and ground flaxseeds. Choose grain products that have whole grains. Look for the word "whole" as the first word in the ingredient list. Cooking Cook foods using low-fat methods. These include baking, boiling, grilling, and broiling. Eat more home-cooked foods. Eat at restaurants and buffets less often. Eat less fast food. Avoid cooking using saturated fats, such as butter, cream, palm oil, palm kernel oil, and  coconut oil. Meal planning  At meals, divide your plate into four equal parts: Fill one-half of your plate with vegetables, green salads, and fruit. Fill one-fourth of your plate with whole grains. Fill one-fourth of your plate with low-fat (lean) protein foods. Eat fish that is high in omega-3 fats at least two times a week. This includes mackerel, tuna, sardines, and salmon. Eat foods that are high in fiber, such as whole grains, beans, apples, pears, berries, broccoli, carrots, peas, and barley. What foods should I eat? Fruits All fresh, canned (in natural juice), or frozen fruits. Vegetables Fresh or frozen vegetables (raw, steamed, roasted, or grilled). Green salads. Grains Whole grains, such as whole wheat or whole grain breads, crackers, cereals, and pasta. Unsweetened oatmeal, bulgur, barley, quinoa, or brown rice. Corn or whole wheat flour tortillas. Meats and other protein foods Ground beef (85% or leaner), grass-fed beef, or beef trimmed of fat. Skinless chicken or turkey. Ground chicken or turkey. Pork trimmed of fat. All fish and seafood. Egg whites. Dried beans, peas, or lentils. Unsalted nuts or seeds. Unsalted canned beans. Nut butters without added sugar or oil. Dairy Low-fat or nonfat dairy products, such as skim or 1% milk, 2% or reduced-fat cheeses, low-fat and fat-free ricotta or cottage cheese, or plain low-fat and nonfat yogurt. Fats and oils Tub margarine without trans fats. Light or reduced-fat mayonnaise and salad dressings. Avocado. Olive, canola, sesame, or safflower oils. The items listed above may not be a complete list of foods and beverages you can eat. Contact a dietitian for more information. What foods   should I avoid? Fruits Canned fruit in heavy syrup. Fruit in cream or butter sauce. Fried fruit. Vegetables Vegetables cooked in cheese, cream, or butter sauce. Fried vegetables. Grains White bread. White pasta. White rice. Cornbread. Bagels, pastries,  and croissants. Crackers and snack foods that contain trans fat and hydrogenated oils. Meats and other protein foods Fatty cuts of meat. Ribs, chicken wings, bacon, sausage, bologna, salami, chitterlings, fatback, hot dogs, bratwurst, and packaged lunch meats. Liver and organ meats. Whole eggs and egg yolks. Chicken and turkey with skin. Fried meat. Dairy Whole or 2% milk, cream, half-and-half, and cream cheese. Whole milk cheeses. Whole-fat or sweetened yogurt. Full-fat cheeses. Nondairy creamers and whipped toppings. Processed cheese, cheese spreads, and cheese curds. Fats and oils Butter, stick margarine, lard, shortening, ghee, or bacon fat. Coconut, palm kernel, and palm oils. Beverages Alcohol. Sugar-sweetened drinks such as sodas, lemonade, and fruit drinks. Sweets and desserts Corn syrup, sugars, honey, and molasses. Candy. Jam and jelly. Syrup. Sweetened cereals. Cookies, pies, cakes, donuts, muffins, and ice cream. The items listed above may not be a complete list of foods and beverages you should avoid. Contact a dietitian for more information. Summary Choosing the right foods helps keep your fat and cholesterol at normal levels. This can keep you from getting certain diseases. At meals, fill one-half of your plate with vegetables, green salads, and fruits. Eat high fiber foods, like whole grains, beans, apples, pears, berries, carrots, peas, and barley. Limit added sugar, saturated fats, alcohol, and fried foods. This information is not intended to replace advice given to you by your health care provider. Make sure you discuss any questions you have with your health care provider. Document Revised: 02/08/2021 Document Reviewed: 02/08/2021 Elsevier Patient Education  2022 [employer]  

## 2021-10-16 ENCOUNTER — Encounter: Payer: Self-pay | Admitting: Nurse Practitioner

## 2021-10-16 ENCOUNTER — Other Ambulatory Visit: Payer: Self-pay | Admitting: Nurse Practitioner

## 2021-10-16 DIAGNOSIS — D352 Benign neoplasm of pituitary gland: Secondary | ICD-10-CM

## 2021-10-16 NOTE — Telephone Encounter (Signed)
I just placed a new referral to endocrinology placed as urgent. 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 Was to have appointment with new provider on Monday, but that provider will not be starting. The patient has been waiting for 2 months to see endocrinology due to pituitary microadenoma

## 2021-10-16 NOTE — Telephone Encounter (Signed)
Pt is advised of her new referral that was place for endocrinology

## 2021-10-16 NOTE — Progress Notes (Signed)
New referral to endocrinology placed as urgent. 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 Was to have appointment with new provider on Monday, but that provider will not be starting. The patient has been waiting for 2 months to see endocrinology due to pituitary microadenoma

## 2021-10-28 ENCOUNTER — Ambulatory Visit: Payer: BC Managed Care – PPO | Admitting: Neurology

## 2021-10-29 ENCOUNTER — Other Ambulatory Visit: Payer: Self-pay

## 2021-10-29 ENCOUNTER — Ambulatory Visit: Payer: BC Managed Care – PPO | Admitting: Internal Medicine

## 2021-10-29 ENCOUNTER — Encounter: Payer: Self-pay | Admitting: Internal Medicine

## 2021-10-29 VITALS — BP 136/90 | HR 60 | Ht 70.0 in | Wt 247.4 lb

## 2021-10-29 DIAGNOSIS — E236 Other disorders of pituitary gland: Secondary | ICD-10-CM | POA: Diagnosis not present

## 2021-10-29 DIAGNOSIS — D497 Neoplasm of unspecified behavior of endocrine glands and other parts of nervous system: Secondary | ICD-10-CM | POA: Insufficient documentation

## 2021-10-29 NOTE — Progress Notes (Signed)
Patient ID: Linda Vargas, female   DOB: 22-Mar-1976, 46 y.o.   MRN: [id number]  This visit occurred during the SARS-CoV-2 public health emergency.  Safety protocols were in place, including screening questions prior to the visit, additional usage of staff PPE, and extensive cleaning of exam room while observing appropriate contact time as indicated for disinfecting solutions.   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  Pt. has been dx with a pituitary adenoma in 07/2021, during investigation for migraine with aura by [facility] neurology.  She is on nortriptyline for the headaches.  She mentions that she had COVID-19 in 04/2021 and she has had migraines every day since.    She has a history of lupus and has been on chronic steroids (tapers, injections) since 2012.  She has had more steroids around the time of her COVID-19 infection. Since having had COVID, she is more tired and feels she gained 40 pounds.  She has wide, colorless stretch marks.  No proximal muscle weakness.  She has a history of HTN.  She denies changing in shoe size or ring size, increased sweating, or jaw enlargement.  No breast tension or galactorrhea.  Her menstrual cycles have become heavy, painful, and she has 1-3 a months.  Also, occasional hot flashes along with cold intolerance.  She recently had ophthalmology evaluation.  She was found to have an abnormal visual field.  Reportedly, no glaucoma.  Brain MRI (without contrast, 07/20/2021): Hyperintense focus in the left pituitary gland: possible pituitary microadenoma versus developmental cyst.  It was commented that this was new since her MRI of 2017. Apparent 2 mm round T2 hyperintense focus within the left aspect of the pituitary gland     I reviewed previous pertinent investigation: Component     Latest Ref Rng & Units 08/28/2021   TSH     0.450 - 4.500 uIU/mL 2.970  T4,Free(Direct)     0.82 - 1.77 ng/dL 1.25  LH     mIU/mL 11.8  FSH     mIU/mL 14.3  Estradiol     pg/mL 43.7  Prolactin     4.8 - 23.3 ng/mL 12.1    Pt. also has a history of HTN, GERD, depression, fibromyalgia, and SLE - sees rheumatology - takes a lot of Prednisone (tapers, injection) - last dosing >1 mo ago.  No FH of pituitary tumors, hypercalcemia, pancreatic or adrenal tumors.   She has family history of Diabetes, HTN, HL, heart disease, thyroid disease.  No exposure to Radiation. No h/o cancer. She had VF abnormalities; also has blurry vision after her Covid19 episode.  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   Fibromyalgia    Hypertension    Lupus ([place])    Migraine    Seasonal allergic rhinitis    Past Surgical History:  Procedure Laterality Date   CHOLECYSTECTOMY  2012   [place] OF UTERUS  2011  DILATION AND CURETTAGE OF UTERUS  01/17/2012   Procedure: DILATATION AND CURETTAGE;  Surgeon: Marylynn Pearson, MD;  Location: [facility];  Service: Gynecology;  Laterality: N/A;  Dilatation and curratage, insertion of Bakri Balloon   FOOT SURGERY     HARDWARE REMOVAL Right 02/18/2018   Procedure: HARDWARE REMOVAL RIGHT WRIST;  Surgeon: Hessie Knows, MD;  Location: [facility];  Service: Orthopedics;  Laterality: Right;   OPEN REDUCTION  INTERNAL FIXATION (ORIF) DISTAL RADIAL FRACTURE Right 11/26/2017   Procedure: OPEN REDUCTION INTERNAL FIXATION (ORIF) DISTAL RADIAL FRACTURE;  Surgeon: Hessie Knows, MD;  Location: [facility];  Service: Orthopedics;  Laterality: Right;   TONSILLECTOMY     WRIST SURGERY Right 02/18/2018   Social History   Socioeconomic History   Marital status: Single    Spouse name: Not on file   Number of children: Not on file   Years of education: Not on file   Highest education level: Not on file  Occupational History   Not on file  Tobacco Use   Smoking status: Never   Smokeless tobacco: Never  Vaping Use   Vaping Use: Never used  Substance and Sexual Activity   Alcohol use: No   Drug use: No   Sexual activity: Yes    Birth control/protection: I.U.D.  Other Topics Concern   Not on file  Social History Narrative   Lives at home.   Social Determinants of Health   Financial Resource Strain: Not on file  Food Insecurity: Not on file  Transportation Needs: Not on file  Physical Activity: Not on file  Stress: Not on file  Social Connections: Not on file  Intimate Partner Violence: Not on file   Current Outpatient Medications on File Prior to Visit  Medication Sig Dispense Refill   acetaminophen (TYLENOL) 500 MG tablet Take 1,000 mg by mouth every 8 (eight) hours as needed for headache.      albuterol (VENTOLIN HFA) 108 (90 Base) MCG/ACT inhaler Inhale 2 puffs into the lungs 3 (three) times daily as needed for wheezing or shortness of breath. 1 each 0   atenolol (TENORMIN) 25 MG tablet Take 0.5 tablets (12.5 mg total) by mouth daily. 45 tablet 3   Azelastine HCl 0.15 % SOLN INL 2 PFS IEN QD  12   clobetasol cream (TEMOVATE) 3.47 % Apply 1 application topically 2 (two) times daily. 60 g 1   doxycycline (VIBRA-TABS) 100 MG tablet Take 1 tablet (100 mg total) by mouth 2 (two) times daily. 42 tablet 0   DULoxetine (CYMBALTA) 30 MG capsule Take 1 capsule (30 mg total) by mouth 2 (two) times  daily. 60 capsule 3   EPIPEN 2-PAK 0.3 MG/0.3ML SOAJ injection INJECT INTRAMUSCULARLY AS DIRECTED FOR ANAPHYLATIC REACTIONS  0   ergocalciferol (DRISDOL) 1.25 MG (50000 UT) capsule Take 1 capsule (50,000 Units total) by mouth once a week. 4 capsule 5   hydroxychloroquine (PLAQUENIL) 200 MG tablet Take 200 mg by mouth 2 (two) times daily.     hydrOXYzine (ATARAX/VISTARIL) 25 MG tablet Take 1 tablet (25 mg total) by mouth every 8 (eight) hours as needed for anxiety. 90 tablet 1   losartan (COZAAR) 50 MG tablet TAKE 1 TABLET(50 MG) BY MOUTH DAILY 90 tablet 1   ondansetron (ZOFRAN ODT) 8 MG disintegrating tablet Take 1 tablet (8 mg total) by mouth every 8 (eight) hours as needed for nausea or vomiting. 30 tablet 2   rizatriptan (MAXALT) 10 MG tablet Take 1 tablet (10 mg total) by mouth  as needed for migraine. May repeat in 2 hours if needed 10 tablet 2   No current facility-administered medications on file prior to visit.   Allergies  Allergen Reactions   Ibuprofen Swelling    Lips swell   Bean Pod Extract Hives   Corn-Containing Products Hives   Peanut-Containing Drug Products Hives   Family History  Problem Relation Age of Onset   Hypertension Mother    Hypertension Father    Diabetes Father    Heart disease Maternal Grandmother    Heart disease Paternal Grandmother    Anesthesia problems Neg Hx    PE: BP 136/90 (BP Location: Left Arm, Patient Position: Sitting, Cuff Size: Normal)    Pulse 60    Ht 5\' 10"  (1.778 m)    Wt 247 lb 6.4 oz (112.2 kg)    SpO2 99%    BMI 35.50 kg/m  Wt Readings from Last 3 Encounters:  10/29/21 247 lb 6.4 oz (112.2 kg)  10/09/21 243 lb 11.2 oz (110.5 kg)  08/28/21 241 lb 14.4 oz (109.7 kg)   Constitutional: overweight, in NAD Eyes: PERRLA, EOMI, no exophthalmos ENT: moist mucous membranes, no thyromegaly, no cervical lymphadenopathy Cardiovascular: RRR, No MRG Respiratory: CTA B Gastrointestinal: abdomen soft, NT, ND, BS+ Musculoskeletal: no  deformities, strength intact in all 4 Skin: moist, warm, no rashes Neurological: no tremor with outstretched hands, DTR normal in all 4  ASSESSMENT: 1. Pituitary incidentaloma  PLAN:  1. Patient with a newly diagnosed pituitary incidentally found mass, during investigation for migraines - I reviewed the pituitary MRI images and report along with the patient.  She has a very small 2 mm nodule in the left side of her pituitary. - I explained that this is not a "brain tumor". These tumors are extremely rarely malignant, the vast majority of them are benign.  - We discussed about the fact that the pituitary adenoma can be: A cyst  A nodule (possibly with cystic degeneration since this had a high T2 signal) - Not producing hormones, not compressing the pituitary gland or the optic chiasm - Not producing hormones, but compressing adjacent structures -including the pituitary gland (causing hypopituitarism) or the optic chiasm (causing visual field cuts). She did have an abnormal visual field, however, we reviewed together the images and I pointed out that her pituitary tumor is very small, and does not appear to be extending outside the sella turcica, so it is quite far from the optic chiasm - I would not expect a mass-effect from - Producing hormones: - Prolactin (prolactinoma) - however, she has monthly menstrual cycles and no breast milk discharge.  Prolactin level was normal. - ACTH (Cushing's disease) - she has recent weight gain (reportedly 40 pounds, however, reviewing her chart, in 03/2021, weight was 241, now 247 lbs), with central distribution of fat, and also wide stretch marks, but it is possible that this is in the setting chronic steroids treatment.  She has a history of hypertension. - growth hormone (acromegaly) - she denies an enlarged jaw, change in the size of rings or changing shoe sizes - LH or FSH (gonadotropin secreting tumor) - her menstrual cycles occur monthly, but she  occasionally has more bleeding during the months.  LH, FSH, estradiol have been normal. - TSH (TSH secreting tumor) (very rare) - recent TSH + free T4 have been normal.   - as the first step in the investigation we will need to check the rest of the pituitary hormones, they were not already  checked. I ordered the following labs to be checked fasting, in the morning-she will return for these: Orders Placed This Encounter  Procedures   Growth hormone   Insulin-like growth factor   Cortisol   ACTH  - the following scenarios are possible: if the above labs are normal, we can repeat a pituitary MRI in a year to check for growth. If there is no growth, we can manage the pituitary tumor expectantly, without surgical intervention. If the above labs are abnormal, depending on the hormone that it is producing, we may treat this medically (cabergoline/bromocriptine for prolactinoma) or surgically for the other tumors. - we discussed about pituitary surgery if this is needed-3 types: Transsphenoidal nasal (most commonly used) Transsphenoidal sublabial - under upper lip Open craniotomy (not applicable for her for such a small tumor) -rarely done any more - we'll proceed with the above tests and will decide about further testing or treatment when the results are back.  If the results are normal, I would not suggest to get a contrasted MRI for now, since I do not feel this would change management.  However, I would like to repeat the MRI in a year, and at that time, we need to obtain a pituitary-protocol MRI with contrast -I will see her back in a year  Component     Latest Ref Rng & Units 10/30/2021  IGF-I, LC/MS     52 - 328 ng/mL 173  Z-Score (Female)     -2.0 - 2.0 SD 0.4  C206 ACTH     6 - 50 pg/mL 20  Cortisol, Plasma     ug/dL 11.3  Growth Hormone     < OR = 7.1 ng/mL <0.1  Labs are normal.  Plan to repeat the MRI in a year.  No intervention needed for now.  Philemon Kingdom, MD PhD [facility]  Endocrinology

## 2021-10-29 NOTE — Patient Instructions (Signed)
Please come back for labs at 8-9 am, fasting.  Please come back for a follow-up appointment in 1 year.

## 2021-10-30 ENCOUNTER — Ambulatory Visit: Payer: BC Managed Care – PPO

## 2021-10-30 ENCOUNTER — Other Ambulatory Visit (INDEPENDENT_AMBULATORY_CARE_PROVIDER_SITE_OTHER): Payer: BC Managed Care – PPO

## 2021-10-30 DIAGNOSIS — E236 Other disorders of pituitary gland: Secondary | ICD-10-CM | POA: Diagnosis not present

## 2021-10-30 LAB — CORTISOL: Cortisol, Plasma: 11.3 ug/dL

## 2021-11-05 ENCOUNTER — Encounter: Payer: Self-pay | Admitting: Internal Medicine

## 2021-11-05 LAB — INSULIN-LIKE GROWTH FACTOR
IGF-I, LC/MS: 173 ng/mL (ref 52–328)
Z-Score (Female): 0.4 SD (ref ?–2.0)

## 2021-11-05 LAB — GROWTH HORMONE: Growth Hormone: <0.1 ng/mL (ref <=7.1)

## 2021-11-05 LAB — ACTH: C206 ACTH: 20 pg/mL (ref 6–50)

## 2021-11-06 ENCOUNTER — Encounter: Payer: Self-pay | Admitting: Nurse Practitioner

## 2021-11-06 ENCOUNTER — Ambulatory Visit (INDEPENDENT_AMBULATORY_CARE_PROVIDER_SITE_OTHER): Payer: BC Managed Care – PPO | Admitting: Nurse Practitioner

## 2021-11-06 ENCOUNTER — Other Ambulatory Visit: Payer: Self-pay

## 2021-11-06 VITALS — BP 147/86 | HR 70 | Temp 98.5°F | Ht 70.0 in | Wt 246.1 lb

## 2021-11-06 DIAGNOSIS — D352 Benign neoplasm of pituitary gland: Secondary | ICD-10-CM | POA: Diagnosis not present

## 2021-11-06 DIAGNOSIS — U099 Post covid-19 condition, unspecified: Secondary | ICD-10-CM

## 2021-11-06 DIAGNOSIS — R5383 Other fatigue: Secondary | ICD-10-CM

## 2021-11-06 DIAGNOSIS — M3219 Other organ or system involvement in systemic lupus erythematosus: Secondary | ICD-10-CM

## 2021-11-06 DIAGNOSIS — I10 Essential (primary) hypertension: Secondary | ICD-10-CM | POA: Diagnosis not present

## 2021-11-06 DIAGNOSIS — G43119 Migraine with aura, intractable, without status migrainosus: Secondary | ICD-10-CM | POA: Diagnosis not present

## 2021-11-06 DIAGNOSIS — Z6835 Body mass index (BMI) 35.0-35.9, adult: Secondary | ICD-10-CM

## 2021-11-06 MED ORDER — ATENOLOL 50 MG PO TABS: 75.0000 mg | ORAL_TABLET | Freq: Every day | ORAL | 2 refills | Status: DC

## 2021-11-06 NOTE — Progress Notes (Signed)
Established patient visit   Patient: Linda Vargas   DOB: 04/10/1976   46 y.o. Female  MRN: [id number] Visit Date: 11/06/2021   Chief Complaint  Patient presents with   Hypertension   Subjective    Chronic migraines  - seeing neurology - will be starting Emgality on Friday. -concern for increased intracranial pressure. Will see another neurologist for further assessment of this possibility.  -will be seeing lupus provider to see if headaches and hypertension may be caused by or worsened by or associated with lupus -has seen endocrinologist. Stress hormones and other blood work are good. Will follow up in one year for MRI with contrast to recheck pituitary microadenoma.  -visual changes. Blurry vision in periphery. Seeing opthalmologist. Will be following up with them later this month. -will be seeing a specialist in area of long term effects of COVID 19 infection.  -FMLA/disability paperwork to be filled out and returned to the patient as soon as possible.   Recent appointments: -[facility] - 10/11/2021 -Luella Cook, neurology - 10/29/2021 -endocrinology - 10/29/2021  Upcoming appointments: -Dr. Melrose Nakayama, neurologist  -  12/17/2021 -Dr. Cornelia Copa, neurology - 11/08/2021 -[facility] - 12/06/2021 -[place] - 11/11/2021  Currently scheduled to go back to work on 11/19/2021. Will need to extend FMLA through 2nd week in March to allow for consultations and initial treatments with specialist providers. Will reassess patient prior to going back to work. For now, will extend FMLA through 12/30/2021.   Hypertension This is a chronic problem. The current episode started more than 1 year ago. The problem is resistant. Associated symptoms include anxiety, blurred vision, headaches and malaise/fatigue. Pertinent negatives include no chest pain, palpitations or shortness of breath. There are no associated agents to hypertension. Risk factors for coronary artery disease include  dyslipidemia, obesity and stress. Past treatments include alpha 1 blockers, beta blockers and lifestyle changes. The current treatment provides moderate improvement. Compliance problems include exercise.       Medications: Outpatient Medications Prior to Visit  Medication Sig   acetaminophen (TYLENOL) 500 MG tablet Take 1,000 mg by mouth every 8 (eight) hours as needed for headache.    albuterol (VENTOLIN HFA) 108 (90 Base) MCG/ACT inhaler Inhale 2 puffs into the lungs 3 (three) times daily as needed for wheezing or shortness of breath.   Azelastine HCl 0.15 % SOLN INL 2 PFS IEN QD   clobetasol cream (TEMOVATE) 0.07 % Apply 1 application topically 2 (two) times daily.   doxycycline (VIBRA-TABS) 100 MG tablet Take 1 tablet (100 mg total) by mouth 2 (two) times daily.   DULoxetine (CYMBALTA) 30 MG capsule Take 1 capsule (30 mg total) by mouth 2 (two) times daily.   EMGALITY 120 MG/ML SOAJ Inject 1 mL into the skin every 30 (thirty) days.   EPIPEN 2-PAK 0.3 MG/0.3ML SOAJ injection INJECT INTRAMUSCULARLY AS DIRECTED FOR ANAPHYLATIC REACTIONS   ergocalciferol (DRISDOL) 1.25 MG (50000 UT) capsule Take 1 capsule (50,000 Units total) by mouth once a week.   hydroxychloroquine (PLAQUENIL) 200 MG tablet Take 200 mg by mouth 2 (two) times daily.   hydrOXYzine (ATARAX/VISTARIL) 25 MG tablet Take 1 tablet (25 mg total) by mouth every 8 (eight) hours as needed for anxiety.   losartan (COZAAR) 50 MG tablet TAKE 1 TABLET(50 MG) BY MOUTH DAILY   ondansetron (ZOFRAN ODT) 8 MG disintegrating tablet Take 1 tablet (8 mg total) by mouth every 8 (eight) hours as needed for nausea or vomiting.   rizatriptan (MAXALT) 10 MG  tablet Take 1 tablet (10 mg total) by mouth as needed for migraine. May repeat in 2 hours if needed   [DISCONTINUED] atenolol (TENORMIN) 25 MG tablet Take 0.5 tablets (12.5 mg total) by mouth daily.   No facility-administered medications prior to visit.    Review of Systems  Constitutional:   Positive for activity change, fatigue and malaise/fatigue. Negative for appetite change, chills and fever.  HENT:  Negative for congestion, postnasal drip, rhinorrhea, sinus pressure, sinus pain, sneezing and sore throat.   Eyes:  Positive for blurred vision.  Respiratory:  Negative for cough, chest tightness, shortness of breath and wheezing.   Cardiovascular:  Negative for chest pain and palpitations.       Elevated blood pressure   Gastrointestinal:  Positive for nausea and vomiting. Negative for abdominal pain, constipation and diarrhea.  Endocrine: Negative for cold intolerance, heat intolerance, polydipsia and polyuria.       Recent history of pituitary microadenoma or cyst   Genitourinary:  Negative for dyspareunia, dysuria, flank pain, frequency and urgency.  Musculoskeletal:  Positive for arthralgias and myalgias. Negative for back pain.  Skin:  Negative for rash.  Allergic/Immunologic: Positive for environmental allergies.  Neurological:  Positive for dizziness and headaches. Negative for weakness.  Hematological:  Negative for adenopathy.  Psychiatric/Behavioral:  Positive for dysphoric mood. The patient is nervous/anxious.    Last CBC Lab Results  Component Value Date   WBC 4.8 08/28/2021   HGB 12.9 08/28/2021   HCT 38.0 08/28/2021   MCV 82 08/28/2021   MCH 27.9 08/28/2021   RDW 12.2 08/28/2021   PLT 452 (H) 49/44/9675   Last metabolic panel Lab Results  Component Value Date   GLUCOSE 58 (L) 08/28/2021   NA 141 08/28/2021   K 5.2 08/28/2021   CL 105 08/28/2021   CO2 22 08/28/2021   BUN 11 08/28/2021   CREATININE 0.96 08/28/2021   EGFR 75 08/28/2021   CALCIUM 9.6 08/28/2021   PROT 7.6 08/28/2021   ALBUMIN 4.2 08/28/2021   LABGLOB 3.4 08/28/2021   AGRATIO 1.2 08/28/2021   BILITOT 0.3 08/28/2021   ALKPHOS 113 08/28/2021   AST 21 08/28/2021   ALT 15 08/28/2021   ANIONGAP 11 11/08/2020   Last hemoglobin A1c No results found for: HGBA1C Last thyroid  functions Lab Results  Component Value Date   TSH 2.970 08/28/2021     Objective     Today's Vitals   11/06/21 0945 11/06/21 0952  BP: (!) 153/84 (!) 147/86  Pulse: 70   Temp: 98.5 F (36.9 C)   SpO2: 100%   Weight: 246 lb 1.6 oz (111.6 kg)   Height: 5' 10"  (1.778 m)    Body mass index is 35.31 kg/m.   BP Readings from Last 3 Encounters:  11/06/21 (!) 147/86  10/29/21 136/90  10/09/21 125/82    Wt Readings from Last 3 Encounters:  11/06/21 246 lb 1.6 oz (111.6 kg)  10/29/21 247 lb 6.4 oz (112.2 kg)  10/09/21 243 lb 11.2 oz (110.5 kg)    Physical Exam Vitals and nursing note reviewed.  Constitutional:      Appearance: Normal appearance. She is well-developed. She is ill-appearing.  HENT:     Head: Normocephalic and atraumatic.     Right Ear: Tympanic membrane, ear canal and external ear normal.     Left Ear: Tympanic membrane, ear canal and external ear normal.     Nose: Nose normal.     Mouth/Throat:     Mouth: Mucous  membranes are moist.     Pharynx: Oropharynx is clear.  Eyes:     Extraocular Movements: Extraocular movements intact.     Conjunctiva/sclera: Conjunctivae normal.     Pupils: Pupils are equal, round, and reactive to light.  Cardiovascular:     Rate and Rhythm: Normal rate and regular rhythm.     Pulses: Normal pulses.     Heart sounds: Normal heart sounds.  Pulmonary:     Effort: Pulmonary effort is normal.     Breath sounds: Normal breath sounds.  Abdominal:     Palpations: Abdomen is soft.  Musculoskeletal:        General: Normal range of motion.     Cervical back: Normal range of motion and neck supple.  Lymphadenopathy:     Cervical: No cervical adenopathy.  Skin:    General: Skin is warm and dry.     Capillary Refill: Capillary refill takes less than 2 seconds.  Neurological:     General: No focal deficit present.     Mental Status: She is alert and oriented to person, place, and time.  Psychiatric:        Attention and  Perception: Attention and perception normal.        Mood and Affect: Mood is anxious and depressed. Affect is tearful.        Speech: Speech normal.        Behavior: Behavior normal. Behavior is cooperative.        Thought Content: Thought content normal.        Cognition and Memory: Cognition and memory normal.        Judgment: Judgment normal.      Assessment & Plan    1. Essential hypertension Increased atenolol 27m tablets to having patient take 1.5 tablets daily. Monitor blood pressure at home. The goal is to have her blood pressure 140/80 or better  - atenolol (TENORMIN) 50 MG tablet; Take 1.5 tablets (75 mg total) by mouth daily.  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 [facility]   3. Intractable migraine with aura without status migrainosus Patient ha appointment this coming Friday for first dose Emgality to prevent chronic migraine headaches. She should follow up with neurology as scheduled.   4. Pituitary microadenoma ([facility] Patient should see endocrinology provider as scheduled for surveillance.   5. Systemic lupus erythematosus with other organ involvement, unspecified SLE type ([place] [facility]   6. Fatigue, unspecified type Likely due to long term effects of Covid 19 and chronic severe migraines.   7. Body mass index (BMI) of 35.0-35.9 in adult Discussed lowering calorie intake to 1500 calories per day and incorporating exercise into daily routine to help lose weight.    Problem List Items Addressed This Visit       Cardiovascular and Mediastinum   Essential hypertension - Primary   Relevant Medications   atenolol (TENORMIN) 50 MG tablet   Intractable migraine with aura without status migrainosus   Relevant Medications   EMGALITY 120 MG/ML SOAJ   atenolol  (TENORMIN) 50 MG tablet     Endocrine   Pituitary microadenoma (H[facility])     Other   Systemic lupus erythematosus (HCC)   Fatigue   COVID-19 long hauler   Body mass index (BMI) of 35.0-35.9 in adult  Return in about 3 years (around 12/27/2024) for blood pressure - see below .         Ronnell Freshwater, NP  [facility] at [facility] [phone] (phone) [phone] (fax)  [place]

## 2021-11-11 ENCOUNTER — Encounter: Payer: Self-pay | Admitting: Nurse Practitioner

## 2021-11-22 ENCOUNTER — Encounter: Payer: Self-pay | Admitting: Nurse Practitioner

## 2021-11-25 NOTE — Telephone Encounter (Signed)
Hey Dr. Madilyn Fireman.  This is one of my long time patients who has had a lot of trouble since having COVID back in the summer. My signature is unacceptable to the state. If she is able to scan the paperwork back in, would you be able to sign it and mail it in to the state? Or back to the patient, since it has to be a "wet" signature?  Thank you so much. Nira Conn

## 2021-11-27 NOTE — Telephone Encounter (Signed)
Heather, I am happy to cosign the forms.  I will look to see if if they were already scanned into epic and we could just print them here and then sign them.  But we may need to get Deirdre to bring them so that I can cosign them then we can always fax them from here and get them scanned into the media tab.

## 2021-11-28 NOTE — Telephone Encounter (Signed)
Hey Dr. Madilyn Fireman, I have printed out the completed forms that were signed by me. I will give them to Deirdre today while she is in the office to bring to you. I think the forms can be either mailed back to the patient or scanned back into her mychart for her to print at home.  Thank you so much for your help.  Nira Conn

## 2021-11-29 NOTE — Telephone Encounter (Signed)
Paperwork cosigned.  Given to Vivia Ewing to scanned into the chart for the patient and sent to scanning to be placed under the media tab.  Copy kept for the next week just as a backup.

## 2021-12-04 ENCOUNTER — Encounter: Payer: Self-pay | Admitting: Nurse Practitioner

## 2021-12-10 ENCOUNTER — Telehealth: Payer: Self-pay | Admitting: Nurse Practitioner

## 2021-12-10 NOTE — Telephone Encounter (Signed)
Patient called requesting to return to work and wants to know does she need to come in for a visit or can you release her? Please advise. [phone]

## 2021-12-11 NOTE — Telephone Encounter (Signed)
Pt has an upcoming appt this week ?

## 2021-12-12 ENCOUNTER — Encounter: Payer: Self-pay | Admitting: Nurse Practitioner

## 2021-12-12 ENCOUNTER — Other Ambulatory Visit: Payer: Self-pay

## 2021-12-12 ENCOUNTER — Ambulatory Visit (INDEPENDENT_AMBULATORY_CARE_PROVIDER_SITE_OTHER): Payer: BC Managed Care – PPO | Admitting: Nurse Practitioner

## 2021-12-12 VITALS — BP 144/86 | HR 69 | Temp 98.0°F | Ht 70.0 in | Wt 243.0 lb

## 2021-12-12 DIAGNOSIS — M3219 Other organ or system involvement in systemic lupus erythematosus: Secondary | ICD-10-CM

## 2021-12-12 DIAGNOSIS — I10 Essential (primary) hypertension: Secondary | ICD-10-CM | POA: Diagnosis not present

## 2021-12-12 DIAGNOSIS — G43119 Migraine with aura, intractable, without status migrainosus: Secondary | ICD-10-CM | POA: Diagnosis not present

## 2021-12-12 DIAGNOSIS — Z6834 Body mass index (BMI) 34.0-34.9, adult: Secondary | ICD-10-CM | POA: Diagnosis not present

## 2021-12-12 NOTE — Progress Notes (Signed)
+Established patient visit ? ? ?Patient: Linda Vargas   DOB: Jan 20, 1976   46 y.o. Female  MRN: [id number] ?Visit Date: 12/12/2021 ? ? ?Chief Complaint  ?Patient presents with  ? Follow-up  ? ?Subjective  ?  ?HPI  ?The patient is here for follow up visit. She has been out of work since 06/18/2021 due to ongoing symptoms of COVID 19 and severe migraine headaches. She has had multiple visits due to these symptoms. She has seen neurology. Is now on emgality 120mg  monthly. She states that she is still having migraine headaches. Feels like she has been learning to function despite having the migraine headaches. When she does have the migraine headaches, they are taking less time to resolve. Vision has improved except when she is at the onset of a migraine headache. She feels like she is still having more than 10 migraines per month. She does have a follow up appointment with her neurologist on Monday.  ?She states that she is ready to go back to work. Would like to return to work on Monday 12/23/2021, as opposed to original date of 12/30/2021.  ? ? ?Medications: ?Outpatient Medications Prior to Visit  ?Medication Sig  ? acetaminophen (TYLENOL) 500 MG tablet Take 1,000 mg by mouth every 8 (eight) hours as needed for headache.   ? albuterol (VENTOLIN HFA) 108 (90 Base) MCG/ACT inhaler Inhale 2 puffs into the lungs 3 (three) times daily as needed for wheezing or shortness of breath.  ? atenolol (TENORMIN) 50 MG tablet Take 1.5 tablets (75 mg total) by mouth daily.  ? Azelastine HCl 0.15 % SOLN INL 2 PFS IEN QD  ? butalbital-acetaminophen-caffeine (FIORICET) 50-325-40 MG tablet butalbital-acetaminophen-caffeine 50 mg-325 mg-40 mg tablet ? TAKE 1 TABLET BY MOUTH EVERY 4 HOURS FOR UP TO 10 DAYS AS NEEDED FOR PAIN  ? clobetasol cream (TEMOVATE) 8.28 % Apply 1 application topically 2 (two) times daily.  ? doxycycline (VIBRA-TABS) 100 MG tablet Take 1 tablet (100 mg total) by mouth 2 (two) times daily.  ? DULoxetine (CYMBALTA) 30  MG capsule Take 1 capsule (30 mg total) by mouth 2 (two) times daily.  ? EMGALITY 120 MG/ML SOAJ Inject 1 mL into the skin every 30 (thirty) days.  ? EPIPEN 2-PAK 0.3 MG/0.3ML SOAJ injection INJECT INTRAMUSCULARLY AS DIRECTED FOR ANAPHYLATIC REACTIONS  ? ergocalciferol (DRISDOL) 1.25 MG (50000 UT) capsule Take 1 capsule (50,000 Units total) by mouth once a week.  ? hydroxychloroquine (PLAQUENIL) 200 MG tablet Take 200 mg by mouth 2 (two) times daily.  ? hydroxychloroquine (PLAQUENIL) 200 MG tablet Take by mouth.  ? hydrOXYzine (ATARAX/VISTARIL) 25 MG tablet Take 1 tablet (25 mg total) by mouth every 8 (eight) hours as needed for anxiety.  ? losartan (COZAAR) 50 MG tablet TAKE 1 TABLET(50 MG) BY MOUTH DAILY  ? ondansetron (ZOFRAN ODT) 8 MG disintegrating tablet Take 1 tablet (8 mg total) by mouth every 8 (eight) hours as needed for nausea or vomiting.  ? rizatriptan (MAXALT) 10 MG tablet Take 1 tablet (10 mg total) by mouth as needed for migraine. May repeat in 2 hours if needed  ? ?No facility-administered medications prior to visit.  ? ? ?Review of Systems  ?Constitutional:  Positive for activity change and fatigue. Negative for appetite change, chills and fever.  ?HENT:  Negative for congestion, postnasal drip, rhinorrhea, sinus pressure, sinus pain, sneezing and sore throat.   ?Respiratory:  Negative for cough, chest tightness, shortness of breath and wheezing.   ?Cardiovascular:  Negative for  chest pain and palpitations.  ?     Elevated blood pressure   ?Gastrointestinal:  Negative for abdominal pain, constipation, diarrhea, nausea and vomiting.  ?Endocrine: Negative for cold intolerance, heat intolerance, polydipsia and polyuria.  ?Genitourinary:  Negative for dyspareunia, dysuria, flank pain, frequency and urgency.  ?Musculoskeletal:  Positive for arthralgias and myalgias. Negative for back pain.  ?Skin:  Negative for rash.  ?Allergic/Immunologic: Positive for environmental allergies.  ?Neurological:  Positive  for dizziness and headaches. Negative for weakness.  ?Hematological:  Negative for adenopathy.  ?Psychiatric/Behavioral:  Positive for dysphoric mood. The patient is nervous/anxious.   ? ? ? ? Objective  ?  ? ?Today's Vitals  ? 12/12/21 1335  ?BP: (!) 144/86  ?Pulse: 69  ?Temp: 98 ?F (36.7 ?C)  ?SpO2: 100%  ?Weight: 243 lb (110.2 kg)  ? ?Body mass index is 34.87 kg/m?.  ? ?BP Readings from Last 3 Encounters:  ?12/12/21 (!) 144/86  ?11/06/21 (!) 147/86  ?10/29/21 136/90  ?  ?Wt Readings from Last 3 Encounters:  ?12/12/21 243 lb (110.2 kg)  ?11/06/21 246 lb 1.6 oz (111.6 kg)  ?10/29/21 247 lb 6.4 oz (112.2 kg)  ?  ?Physical Exam ?Vitals and nursing note reviewed.  ?Constitutional:   ?   Appearance: Normal appearance. She is well-developed.  ?HENT:  ?   Head: Normocephalic and atraumatic.  ?Eyes:  ?   Pupils: Pupils are equal, round, and reactive to light.  ?Cardiovascular:  ?   Rate and Rhythm: Normal rate and regular rhythm.  ?   Pulses: Normal pulses.  ?   Heart sounds: Normal heart sounds.  ?Pulmonary:  ?   Effort: Pulmonary effort is normal.  ?   Breath sounds: Normal breath sounds.  ?Abdominal:  ?   Palpations: Abdomen is soft.  ?Musculoskeletal:     ?   General: Normal range of motion.  ?   Cervical back: Normal range of motion and neck supple.  ?Lymphadenopathy:  ?   Cervical: No cervical adenopathy.  ?Skin: ?   General: Skin is warm and dry.  ?   Capillary Refill: Capillary refill takes less than 2 seconds.  ?Neurological:  ?   General: No focal deficit present.  ?   Mental Status: She is alert and oriented to person, place, and time.  ?Psychiatric:     ?   Attention and Perception: Attention and perception normal.     ?   Mood and Affect: Affect normal. Mood is anxious.     ?   Speech: Speech normal.     ?   Behavior: Behavior normal. Behavior is cooperative.     ?   Thought Content: Thought content normal.     ?   Cognition and Memory: Cognition and memory normal.     ?   Judgment: Judgment normal.  ?  ? ?  Assessment & Plan  ?  ?1. Essential hypertension ?Blood pressure elevated during today's visit. This is generally better on current medication. Continue to monitor closely.  ? ?2. Intractable migraine with aura without status migrainosus ?Patient has upcoming visit with neurology on Tuesday for further evaluation of migraines. Will update medication changes. Will complete return to work paperwork so she may return to work on 12/23/2021.  ? ?3. Systemic lupus erythematosus with other organ involvement, unspecified SLE type ([place]) ?Continue regular visits with lupus clinic as scheduled  ? ?4. Body mass index (BMI) of 34.0-34.9 in adult ?Encourage patient to limit calorie intake to 2000 cal/day  or less.  He should consume a low cholesterol, low-fat diet.   ? ? ?Problem List Items Addressed This Visit   ? ?  ? Cardiovascular and Mediastinum  ? Essential hypertension - Primary  ? Intractable migraine with aura without status migrainosus  ? Relevant Medications  ? butalbital-acetaminophen-caffeine (FIORICET) 50-325-40 MG tablet  ?  ? Other  ? Systemic lupus erythematosus ([place])  ? Body mass index (BMI) of 34.0-34.9 in adult  ?  ? ?Return in about 6 weeks (around 01/23/2022) for blood pressure. migraines .  ?   ? ? ? ? ?Ronnell Freshwater, NP  ?[facility] ?[phone] (phone) ?[phone] (fax) ? ?[facility]  ?

## 2021-12-19 ENCOUNTER — Encounter: Payer: Self-pay | Admitting: Nurse Practitioner

## 2021-12-21 NOTE — Patient Instructions (Signed)
Fat and Cholesterol Restricted Eating Plan Getting too much fat and cholesterol in your diet may cause health problems. Choosing the right foods helps keep your fat and cholesterol at normal levels. This can keep you from getting certain diseases. Your doctor may recommend an eating plan that includes: Total fat: ______% or less of total calories a day. This is ______g of fat a day. Saturated fat: ______% or less of total calories a day. This is ______g of saturated fat a day. Cholesterol: less than _________mg a day. Fiber: ______g a day. What are tips for following this plan? General tips Work with your doctor to lose weight if you need to. Avoid: Foods with added sugar. Fried foods. Foods with trans fat or partially hydrogenated oils. This includes some margarines and baked goods. If you drink alcohol: Limit how much you have to: 0-1 drink a day for women who are not pregnant. 0-2 drinks a day for men. Know how much alcohol is in a drink. In the [place] one drink equals one 12 oz bottle of beer (355 mL), one 5 oz glass of wine (148 mL), or one 1 oz glass of hard liquor (44 mL). Reading food labels Check food labels for: Trans fats. Partially hydrogenated oils. Saturated fat (g) in each serving. Cholesterol (mg) in each serving. Fiber (g) in each serving. Choose foods with healthy fats, such as: Monounsaturated fats and polyunsaturated fats. These include olive and canola oil, flaxseeds, walnuts, almonds, and seeds. Omega-3 fats. These are found in certain fish, flaxseed oil, and ground flaxseeds. Choose grain products that have whole grains. Look for the word "whole" as the first word in the ingredient list. Cooking Cook foods using low-fat methods. These include baking, boiling, grilling, and broiling. Eat more home-cooked foods. Eat at restaurants and buffets less often. Eat less fast food. Avoid cooking using saturated fats, such as butter, cream, palm oil, palm kernel oil, and  coconut oil. Meal planning  At meals, divide your plate into four equal parts: Fill one-half of your plate with vegetables, green salads, and fruit. Fill one-fourth of your plate with whole grains. Fill one-fourth of your plate with low-fat (lean) protein foods. Eat fish that is high in omega-3 fats at least two times a week. This includes mackerel, tuna, sardines, and salmon. Eat foods that are high in fiber, such as whole grains, beans, apples, pears, berries, broccoli, carrots, peas, and barley. What foods should I eat? Fruits All fresh, canned (in natural juice), or frozen fruits. Vegetables Fresh or frozen vegetables (raw, steamed, roasted, or grilled). Green salads. Grains Whole grains, such as whole wheat or whole grain breads, crackers, cereals, and pasta. Unsweetened oatmeal, bulgur, barley, quinoa, or brown rice. Corn or whole wheat flour tortillas. Meats and other protein foods Ground beef (85% or leaner), grass-fed beef, or beef trimmed of fat. Skinless chicken or turkey. Ground chicken or turkey. Pork trimmed of fat. All fish and seafood. Egg whites. Dried beans, peas, or lentils. Unsalted nuts or seeds. Unsalted canned beans. Nut butters without added sugar or oil. Dairy Low-fat or nonfat dairy products, such as skim or 1% milk, 2% or reduced-fat cheeses, low-fat and fat-free ricotta or cottage cheese, or plain low-fat and nonfat yogurt. Fats and oils Tub margarine without trans fats. Light or reduced-fat mayonnaise and salad dressings. Avocado. Olive, canola, sesame, or safflower oils. The items listed above may not be a complete list of foods and beverages you can eat. Contact a dietitian for more information. What foods   should I avoid? Fruits Canned fruit in heavy syrup. Fruit in cream or butter sauce. Fried fruit. Vegetables Vegetables cooked in cheese, cream, or butter sauce. Fried vegetables. Grains White bread. White pasta. White rice. Cornbread. Bagels, pastries,  and croissants. Crackers and snack foods that contain trans fat and hydrogenated oils. Meats and other protein foods Fatty cuts of meat. Ribs, chicken wings, bacon, sausage, bologna, salami, chitterlings, fatback, hot dogs, bratwurst, and packaged lunch meats. Liver and organ meats. Whole eggs and egg yolks. Chicken and turkey with skin. Fried meat. Dairy Whole or 2% milk, cream, half-and-half, and cream cheese. Whole milk cheeses. Whole-fat or sweetened yogurt. Full-fat cheeses. Nondairy creamers and whipped toppings. Processed cheese, cheese spreads, and cheese curds. Fats and oils Butter, stick margarine, lard, shortening, ghee, or bacon fat. Coconut, palm kernel, and palm oils. Beverages Alcohol. Sugar-sweetened drinks such as sodas, lemonade, and fruit drinks. Sweets and desserts Corn syrup, sugars, honey, and molasses. Candy. Jam and jelly. Syrup. Sweetened cereals. Cookies, pies, cakes, donuts, muffins, and ice cream. The items listed above may not be a complete list of foods and beverages you should avoid. Contact a dietitian for more information. Summary Choosing the right foods helps keep your fat and cholesterol at normal levels. This can keep you from getting certain diseases. At meals, fill one-half of your plate with vegetables, green salads, and fruits. Eat high fiber foods, like whole grains, beans, apples, pears, berries, carrots, peas, and barley. Limit added sugar, saturated fats, alcohol, and fried foods. This information is not intended to replace advice given to you by your health care provider. Make sure you discuss any questions you have with your health care provider. Document Revised: 02/08/2021 Document Reviewed: 02/08/2021 Elsevier Patient Education  2022 [employer]  

## 2021-12-24 ENCOUNTER — Other Ambulatory Visit: Payer: Self-pay | Admitting: Internal Medicine

## 2021-12-24 ENCOUNTER — Encounter: Payer: Self-pay | Admitting: Nurse Practitioner

## 2021-12-24 DIAGNOSIS — D497 Neoplasm of unspecified behavior of endocrine glands and other parts of nervous system: Secondary | ICD-10-CM

## 2021-12-25 ENCOUNTER — Ambulatory Visit: Payer: BC Managed Care – PPO | Admitting: Nurse Practitioner

## 2021-12-27 ENCOUNTER — Other Ambulatory Visit: Payer: Self-pay | Admitting: Nurse Practitioner

## 2021-12-27 DIAGNOSIS — F411 Generalized anxiety disorder: Secondary | ICD-10-CM

## 2021-12-30 ENCOUNTER — Encounter: Payer: Self-pay | Admitting: Nurse Practitioner

## 2021-12-31 ENCOUNTER — Telehealth (INDEPENDENT_AMBULATORY_CARE_PROVIDER_SITE_OTHER): Payer: BC Managed Care – PPO | Admitting: Nurse Practitioner

## 2021-12-31 ENCOUNTER — Other Ambulatory Visit: Payer: Self-pay

## 2021-12-31 ENCOUNTER — Encounter: Payer: Self-pay | Admitting: Nurse Practitioner

## 2021-12-31 VITALS — Ht 70.0 in | Wt 243.0 lb

## 2021-12-31 DIAGNOSIS — J0141 Acute recurrent pansinusitis: Secondary | ICD-10-CM

## 2021-12-31 DIAGNOSIS — Z6834 Body mass index (BMI) 34.0-34.9, adult: Secondary | ICD-10-CM

## 2021-12-31 MED ORDER — AZITHROMYCIN 250 MG PO TABS: ORAL_TABLET | ORAL | 0 refills | Status: DC

## 2021-12-31 NOTE — Progress Notes (Signed)
Virtual Visit via Telephone Note ? ?I connected with Linda Vargas on 12/31/21 at  9:10 AM EDT by telephone and verified that I am speaking with the correct person using two identifiers. ? ?Location: ?Patient: workplace  ?Provider: [facility]   ?  ?I discussed the limitations, risks, security and privacy concerns of performing an evaluation and management service by telephone and the availability of in person appointments. I also discussed with the patient that there may be a patient responsible charge related to this service. The patient expressed understanding and agreed to proceed. ? ? ?History of Present Illness: ?The patient reports sinus drainage, post nasal drip, congestion, sneezing, coughing, ear pain, and headache. She denies fever, nausea, or vomiting. She does have history of chronic sinusitis and allergies. She has been using flonase and previously prescribed medication for allergies. These have helped some. She states that symptoms started on Saturday and have been gradually been getting worse.  ?Of note, patient was able to return to work on 12/24/2021.  ?  ?Observations/Objective: ? ?The patient is alert and oriented. She is pleasant and answers all questions appropriately. Breathing is non-labored. She is in no acute distress at this time.  She is nasally congested. A dry cough is noted during the visit.  ? ?Today's Vitals  ? 12/31/21 0918  ?Weight: 243 lb (110.2 kg)  ?Height: '5\' 10"'$  (1.778 m)  ? ?Body mass index is 34.87 kg/m?.  ? ?Assessment and Plan: ?1. Acute recurrent pansinusitis ?Start z-pack. Take as directed for 5 days.  Rest and increase fluids. Continue using OTC medication to control symptoms.   ?- azithromycin (ZITHROMAX) 250 MG tablet; z-pack - take as directed for 5 days  Dispense: 6 tablet; Refill: 0 ? ?2. Body mass index (BMI) of 34.0-34.9 in adult ?Discussed lowering calorie intake to 1500 calories per day and incorporating exercise into daily  routine to help lose weight.  ? ? ?Follow Up Instructions: ? ?  ?I discussed the assessment and treatment plan with the patient. The patient was provided an opportunity to ask questions and all were answered. The patient agreed with the plan and demonstrated an understanding of the instructions. ?  ?The patient was advised to call back or seek an in-person evaluation if the symptoms worsen or if the condition fails to improve as anticipated. ? ?I provided 10 minutes of non-face-to-face time during this encounter. ? ? ?Ronnell Freshwater, NP  ?

## 2022-01-23 ENCOUNTER — Ambulatory Visit: Payer: BC Managed Care – PPO | Admitting: Nurse Practitioner

## 2022-02-03 ENCOUNTER — Ambulatory Visit: Payer: BC Managed Care – PPO | Admitting: Nurse Practitioner

## 2022-02-03 ENCOUNTER — Encounter: Payer: Self-pay | Admitting: Nurse Practitioner

## 2022-02-03 NOTE — Telephone Encounter (Signed)
[email or web address]

## 2022-02-10 ENCOUNTER — Ambulatory Visit (INDEPENDENT_AMBULATORY_CARE_PROVIDER_SITE_OTHER): Payer: BC Managed Care – PPO | Admitting: Nurse Practitioner

## 2022-02-10 ENCOUNTER — Encounter: Payer: Self-pay | Admitting: Nurse Practitioner

## 2022-02-10 VITALS — BP 155/95 | HR 76 | Temp 97.7°F | Ht 70.0 in | Wt 235.0 lb

## 2022-02-10 DIAGNOSIS — R5383 Other fatigue: Secondary | ICD-10-CM

## 2022-02-10 DIAGNOSIS — G43119 Migraine with aura, intractable, without status migrainosus: Secondary | ICD-10-CM | POA: Diagnosis not present

## 2022-02-10 DIAGNOSIS — I10 Essential (primary) hypertension: Secondary | ICD-10-CM

## 2022-02-10 DIAGNOSIS — Z6833 Body mass index (BMI) 33.0-33.9, adult: Secondary | ICD-10-CM

## 2022-02-10 DIAGNOSIS — R112 Nausea with vomiting, unspecified: Secondary | ICD-10-CM

## 2022-02-10 MED ORDER — AMLODIPINE BESYLATE 5 MG PO TABS
5.0000 mg | ORAL_TABLET | Freq: Every day | ORAL | 3 refills | Status: DC
Start: 2022-02-10 — End: 2023-05-14

## 2022-02-10 NOTE — Progress Notes (Signed)
Established patient visit ? ? ?Patient: Linda Vargas   DOB: Sep 08, 1976   46 y.o. Female  MRN: [id number] ?Visit Date: 02/10/2022 ? ? ?No chief complaint on file. ? ?Subjective  ?  ?HPI  ?Patient here for follow up visit . ?-blood pressure elevated today. Was started on amlodipine 2.'5mg'$  daily per neurology. Blood pressure still moderately elevated.  ?-migraine headaches. Currently on emgality. Still getting migraines but still having them regularly. Has noted she is getting nauseated. This is especially bad when migraines are resolving. She does take fioricet as needed to help migraines. This may be causing some of the nausea. She does take zofran when needed if nausea gets severe.  ?-does have allergic rhinitis and sinusitis. Post nasal drip may contribute to nauseas as well.  ?-she will follow up with neurology tomorrow.  ?-seeing both neurology and endocrinology. Endocrinology will see her back yearly.  ? ?Medications: ?Outpatient Medications Prior to Visit  ?Medication Sig  ? [DISCONTINUED] amLODipine (NORVASC) 2.5 MG tablet Take 2.5 mg by mouth daily. Started per neurology to help with BP and migraine headaches  ? acetaminophen (TYLENOL) 500 MG tablet Take 1,000 mg by mouth every 8 (eight) hours as needed for headache.   ? albuterol (VENTOLIN HFA) 108 (90 Base) MCG/ACT inhaler Inhale 2 puffs into the lungs 3 (three) times daily as needed for wheezing or shortness of breath.  ? atenolol (TENORMIN) 50 MG tablet Take 1.5 tablets (75 mg total) by mouth daily.  ? Azelastine HCl 0.15 % SOLN INL 2 PFS IEN QD  ? azithromycin (ZITHROMAX) 250 MG tablet z-pack - take as directed for 5 days  ? butalbital-acetaminophen-caffeine (FIORICET) 50-325-40 MG tablet butalbital-acetaminophen-caffeine 50 mg-325 mg-40 mg tablet ? TAKE 1 TABLET BY MOUTH EVERY 4 HOURS FOR UP TO 10 DAYS AS NEEDED FOR PAIN  ? clobetasol cream (TEMOVATE) 4.33 % Apply 1 application topically 2 (two) times daily.  ? doxycycline (VIBRA-TABS) 100 MG tablet  Take 1 tablet (100 mg total) by mouth 2 (two) times daily.  ? DULoxetine (CYMBALTA) 30 MG capsule Take 1 capsule (30 mg total) by mouth 2 (two) times daily.  ? EMGALITY 120 MG/ML SOAJ Inject 1 mL into the skin every 30 (thirty) days.  ? EPIPEN 2-PAK 0.3 MG/0.3ML SOAJ injection INJECT INTRAMUSCULARLY AS DIRECTED FOR ANAPHYLATIC REACTIONS  ? ergocalciferol (DRISDOL) 1.25 MG (50000 UT) capsule Take 1 capsule (50,000 Units total) by mouth once a week.  ? hydroxychloroquine (PLAQUENIL) 200 MG tablet Take 200 mg by mouth 2 (two) times daily.  ? hydroxychloroquine (PLAQUENIL) 200 MG tablet Take by mouth.  ? hydrOXYzine (ATARAX) 25 MG tablet TAKE 1 TABLET(25 MG) BY MOUTH EVERY 8 HOURS AS NEEDED FOR ANXIETY  ? losartan (COZAAR) 50 MG tablet TAKE 1 TABLET(50 MG) BY MOUTH DAILY  ? ondansetron (ZOFRAN ODT) 8 MG disintegrating tablet Take 1 tablet (8 mg total) by mouth every 8 (eight) hours as needed for nausea or vomiting.  ? rizatriptan (MAXALT) 10 MG tablet Take 1 tablet (10 mg total) by mouth as needed for migraine. May repeat in 2 hours if needed  ? ?No facility-administered medications prior to visit.  ? ? ?Review of Systems  ?Constitutional:  Positive for fatigue. Negative for activity change, appetite change, chills and fever.  ?HENT:  Positive for postnasal drip. Negative for congestion, rhinorrhea, sinus pressure, sinus pain, sneezing and sore throat.   ?Eyes: Negative.   ?Respiratory:  Negative for cough, chest tightness, shortness of breath and wheezing.   ?Cardiovascular:  Negative  for chest pain and palpitations.  ?     Blood pressure elevated today   ?Gastrointestinal:  Negative for abdominal pain, constipation, diarrhea, nausea and vomiting.  ?Endocrine: Negative for cold intolerance, heat intolerance, polydipsia and polyuria.  ?Genitourinary:  Negative for dyspareunia, dysuria, flank pain, frequency and urgency.  ?Musculoskeletal:  Negative for arthralgias, back pain and myalgias.  ?Skin:  Negative for rash.   ?Allergic/Immunologic: Positive for environmental allergies.  ?Neurological:  Positive for headaches. Negative for dizziness and weakness.  ?Hematological:  Negative for adenopathy.  ?Psychiatric/Behavioral:  The patient is nervous/anxious.   ? ? ? ? Objective  ?  ? ?Today's Vitals  ? 02/10/22 1317 02/10/22 1356  ?BP: (!) 157/97 (!) 155/95  ?Pulse: 76   ?Temp: 97.7 ?F (36.5 ?C)   ?SpO2: 100%   ?Weight: 235 lb (106.6 kg)   ?Height: '5\' 10"'$  (1.778 m)   ? ?Body mass index is 33.72 kg/m?.  ? ?BP Readings from Last 3 Encounters:  ?02/10/22 (!) 155/95  ?12/12/21 (!) 144/86  ?11/06/21 (!) 147/86  ?  ?Wt Readings from Last 3 Encounters:  ?02/10/22 235 lb (106.6 kg)  ?12/31/21 243 lb (110.2 kg)  ?12/12/21 243 lb (110.2 kg)  ?  ?Physical Exam ?Vitals and nursing note reviewed.  ?Constitutional:   ?   Appearance: Normal appearance. She is well-developed.  ?HENT:  ?   Head: Normocephalic and atraumatic.  ?   Right Ear: Tympanic membrane, ear canal and external ear normal.  ?   Left Ear: Tympanic membrane, ear canal and external ear normal.  ?   Nose: Nose normal.  ?   Mouth/Throat:  ?   Mouth: Mucous membranes are moist.  ?   Pharynx: Oropharynx is clear.  ?Eyes:  ?   Extraocular Movements: Extraocular movements intact.  ?   Conjunctiva/sclera: Conjunctivae normal.  ?   Pupils: Pupils are equal, round, and reactive to light.  ?Cardiovascular:  ?   Rate and Rhythm: Normal rate and regular rhythm.  ?   Pulses: Normal pulses.  ?   Heart sounds: Normal heart sounds.  ?Pulmonary:  ?   Effort: Pulmonary effort is normal.  ?   Breath sounds: Normal breath sounds.  ?Abdominal:  ?   Palpations: Abdomen is soft.  ?Musculoskeletal:     ?   General: Normal range of motion.  ?   Cervical back: Normal range of motion and neck supple.  ?Lymphadenopathy:  ?   Cervical: No cervical adenopathy.  ?Skin: ?   General: Skin is warm and dry.  ?   Capillary Refill: Capillary refill takes less than 2 seconds.  ?Neurological:  ?   General: No focal  deficit present.  ?   Mental Status: She is alert and oriented to person, place, and time.  ?Psychiatric:     ?   Mood and Affect: Mood normal.     ?   Behavior: Behavior normal.     ?   Thought Content: Thought content normal.     ?   Judgment: Judgment normal.  ?  ? Assessment & Plan  ?  ?1. Essential hypertension ?Increased amlodipine to '5mg'$  daily. Continue atenolol and losartan as previously prescribed. Patient to continue monitoring blood pressure at home. Will contact office if blood pressure continues to run 130/90 or greater.  ?- amLODipine (NORVASC) 5 MG tablet; Take 1 tablet (5 mg total) by mouth daily. Started per neurology to help with BP and migraine headaches  Dispense: 30 tablet; Refill: 3 ? ?  2. Intractable migraine with aura without status migrainosus ?Improving. Patient does have appointment with neurology tomorrow.  ? ?3. Nausea and vomiting, unspecified vomiting type ?May take zofran as needed and as previously prescribed.  ? ?4. Fatigue, unspecified type ?Improving.  ? ?5. Body mass index (BMI) of 33.0-33.9 in adult ?Improving with eight pound weight loss since last visit with me. Discussed lowering calorie intake to 1500 calories per day and incorporating exercise into daily routine to help lose weight. Will discuss medications for weight loss at next visit.  ? ? ?Problem List Items Addressed This Visit   ? ?  ? Cardiovascular and Mediastinum  ? Essential hypertension - Primary  ? Relevant Medications  ? amLODipine (NORVASC) 5 MG tablet  ? Intractable migraine with aura without status migrainosus  ? Relevant Medications  ? amLODipine (NORVASC) 5 MG tablet  ?  ? Digestive  ? Nausea and vomiting  ?  ? Other  ? Fatigue  ? Body mass index (BMI) of 33.0-33.9 in adult  ?  ? ?Return in about 4 weeks (around 03/10/2022) for migraines, blood pressure - discuss weight manaagement medication.  ?   ? ? ? ? ?Ronnell Freshwater, NP  ?[facility] ?[phone] (phone) ?[phone]  (fax) ? ?[facility]  ?

## 2022-02-10 NOTE — Patient Instructions (Signed)
Migraine Headache ?A migraine headache is a very strong throbbing pain on one side or both sides of your head. This type of headache can also cause other symptoms. It can last from 4 hours to 3 days. Talk with your doctor about what things may bring on (trigger) this condition. ?What are the causes? ?The exact cause of this condition is not known. This condition may be triggered or caused by: ?Drinking alcohol. ?Smoking. ?Taking medicines, such as: ?Medicine used to treat chest pain (nitroglycerin). ?Birth control pills. ?Estrogen. ?Some blood pressure medicines. ?Eating or drinking certain products. ?Doing physical activity. ?Other things that may trigger a migraine headache include: ?Having a menstrual period. ?Pregnancy. ?Hunger. ?Stress. ?Not getting enough sleep or getting too much sleep. ?Weather changes. ?Tiredness (fatigue). ?What increases the risk? ?Being 25-55 years old. ?Being female. ?Having a family history of migraine headaches. ?Being Caucasian. ?Having depression or anxiety. ?Being very overweight. ?What are the signs or symptoms? ?A throbbing pain. This pain may: ?Happen in any area of the head, such as on one side or both sides. ?Make it hard to do daily activities. ?Get worse with physical activity. ?Get worse around bright lights or loud noises. ?Other symptoms may include: ?Feeling sick to your stomach (nauseous). ?Vomiting. ?Dizziness. ?Being sensitive to bright lights, loud noises, or smells. ?Before you get a migraine headache, you may get warning signs (an aura). An aura may include: ?Seeing flashing lights or having blind spots. ?Seeing bright spots, halos, or zigzag lines. ?Having tunnel vision or blurred vision. ?Having numbness or a tingling feeling. ?Having trouble talking. ?Having weak muscles. ?Some people have symptoms after a migraine headache (postdromal phase), such as: ?Tiredness. ?Trouble thinking (concentrating). ?How is this treated? ?Taking medicines that: ?Relieve  pain. ?Relieve the feeling of being sick to your stomach. ?Prevent migraine headaches. ?Treatment may also include: ?Having acupuncture. ?Avoiding foods that bring on migraine headaches. ?Learning ways to control your body functions (biofeedback). ?Therapy to help you know and deal with negative thoughts (cognitive behavioral therapy). ?Follow these instructions at home: ?Medicines ?Take over-the-counter and prescription medicines only as told by your doctor. ?Ask your doctor if the medicine prescribed to you: ?Requires you to avoid driving or using heavy machinery. ?Can cause trouble pooping (constipation). You may need to take these steps to prevent or treat trouble pooping: ?Drink enough fluid to keep your pee (urine) pale yellow. ?Take over-the-counter or prescription medicines. ?Eat foods that are high in fiber. These include beans, whole grains, and fresh fruits and vegetables. ?Limit foods that are high in fat and sugar. These include fried or sweet foods. ?Lifestyle ?Do not drink alcohol. ?Do not use any products that contain nicotine or tobacco, such as cigarettes, e-cigarettes, and chewing tobacco. If you need help quitting, ask your doctor. ?Get at least 8 hours of sleep every night. ?Limit and deal with stress. ?General instructions ? ?  ? ?Keep a journal to find out what may bring on your migraine headaches. For example, write down: ?What you eat and drink. ?How much sleep you get. ?Any change in what you eat or drink. ?Any change in your medicines. ?If you have a migraine headache: ?Avoid things that make your symptoms worse, such as bright lights. ?It may help to lie down in a dark, quiet room. ?Do not drive or use heavy machinery. ?Ask your doctor what activities are safe for you. ?Keep all follow-up visits as told by your doctor. This is important. ?Contact a doctor if: ?You get a migraine   headache that is different or worse than others you have had. ?You have more than 15 headache days in one  month. ?Get help right away if: ?Your migraine headache gets very bad. ?Your migraine headache lasts longer than 72 hours. ?You have a fever. ?You have a stiff neck. ?You have trouble seeing. ?Your muscles feel weak or like you cannot control them. ?You start to lose your balance a lot. ?You start to have trouble walking. ?You pass out (faint). ?You have a seizure. ?Summary ?A migraine headache is a very strong throbbing pain on one side or both sides of your head. These headaches can also cause other symptoms. ?This condition may be treated with medicines and changes to your lifestyle. ?Keep a journal to find out what may bring on your migraine headaches. ?Contact a doctor if you get a migraine headache that is different or worse than others you have had. ?Contact your doctor if you have more than 15 headache days in a month. ?This information is not intended to replace advice given to you by your health care provider. Make sure you discuss any questions you have with your health care provider. ?Document Revised: 01/21/2019 Document Reviewed: 11/11/2018 ?Elsevier Patient Education ? 2023 [employer] ? ?

## 2022-02-10 NOTE — Telephone Encounter (Signed)
Paperwork completed and returned to her during visit 02/10/2022.

## 2022-02-23 ENCOUNTER — Telehealth: Payer: BC Managed Care – PPO | Admitting: Physician Assistant

## 2022-02-23 DIAGNOSIS — B9789 Other viral agents as the cause of diseases classified elsewhere: Secondary | ICD-10-CM | POA: Diagnosis not present

## 2022-02-23 DIAGNOSIS — J019 Acute sinusitis, unspecified: Secondary | ICD-10-CM | POA: Diagnosis not present

## 2022-02-24 ENCOUNTER — Encounter: Payer: Self-pay | Admitting: Nurse Practitioner

## 2022-02-24 ENCOUNTER — Telehealth (INDEPENDENT_AMBULATORY_CARE_PROVIDER_SITE_OTHER): Payer: BC Managed Care – PPO | Admitting: Nurse Practitioner

## 2022-02-24 VITALS — Ht 70.0 in | Wt 235.0 lb

## 2022-02-24 DIAGNOSIS — J0141 Acute recurrent pansinusitis: Secondary | ICD-10-CM

## 2022-02-24 MED ORDER — CEFUROXIME AXETIL 500 MG PO TABS: 500.0000 mg | ORAL_TABLET | Freq: Two times a day (BID) | ORAL | 0 refills | Status: AC

## 2022-02-24 MED ORDER — FLUTICASONE PROPIONATE 50 MCG/ACT NA SUSP: 2.0000 | Freq: Every day | NASAL | 0 refills | Status: AC

## 2022-02-24 MED ORDER — METHYLPREDNISOLONE 4 MG PO TBPK: ORAL_TABLET | ORAL | 0 refills | Status: DC

## 2022-02-24 NOTE — Progress Notes (Signed)
Virtual Visit via Telephone Note  I connected with Linda Vargas on 03/10/22 at  3:50 PM EDT by telephone and verified that I am speaking with the correct person using two identifiers.  Location: Patient: home Provider: [facility]     I discussed the limitations, risks, security and privacy concerns of performing an evaluation and management service by telephone and the availability of in person appointments. I also discussed with the patient that there may be a patient responsible charge related to this service. The patient expressed understanding and agreed to proceed.   History of Present Illness: Patient states she has had nasal congestion, headache, cough, sore throat, and generalized fatigue for the last several days.  States eyes feel very swollen and puffy.  Has tried over-the-counter medication. They have not given her any relief.  She does feel nausea but has not had vomiting episodes.  Has been difficult to get up and function due to severity of symptoms.  She has taken a home test for COVID-19 which was negative yesterday.  Observations/Objective: The patient is alert and oriented. She is pleasant and answers all questions appropriately. Breathing is non-labored. She is in no acute distress at this time.  She is nasally congested.  She does have a loose sounding cough which is nonproductive. Today's Vitals   02/24/22 1519  Weight: 235 lb (106.6 kg)  Height: '5\' 10"'$  (1.778 m)   Body mass index is 33.72 kg/m.   Assessment and Plan: 1. Acute recurrent pansinusitis Start Ceftin 500 mg twice daily for next 30 days.  Add Medrol taper.  Take as directed for 6 days. Rest and increase fluids. Continue using OTC medication to control symptoms.   - cefUROXime (CEFTIN) 500 MG tablet; Take 1 tablet (500 mg total) by mouth 2 (two) times daily with a meal for 7 days.  Dispense: 14 tablet; Refill: 0 - methylPREDNISolone (MEDROL) 4 MG TBPK tablet; Take by  mouth as directed for 6 days  Dispense: 21 tablet; Refill: 0   Follow Up Instructions:    I discussed the assessment and treatment plan with the patient. The patient was provided an opportunity to ask questions and all were answered. The patient agreed with the plan and demonstrated an understanding of the instructions.   The patient was advised to call back or seek an in-person evaluation if the symptoms worsen or if the condition fails to improve as anticipated.  I provided 15 minutes of non-face-to-face time during this encounter.   Ronnell Freshwater, NP

## 2022-02-24 NOTE — Progress Notes (Addendum)

## 2022-02-24 NOTE — Progress Notes (Signed)
I have spent 5 minutes in review of e-visit questionnaire, review and updating patient chart, medical decision making and response to patient.   Ahliya Glatt Cody Ginelle Bays, PA-C    

## 2022-03-12 ENCOUNTER — Encounter: Payer: Self-pay | Admitting: Nurse Practitioner

## 2022-03-12 ENCOUNTER — Ambulatory Visit: Payer: BC Managed Care – PPO | Admitting: Nurse Practitioner

## 2022-03-24 ENCOUNTER — Other Ambulatory Visit: Payer: Self-pay

## 2022-03-24 DIAGNOSIS — R110 Nausea: Secondary | ICD-10-CM

## 2022-03-24 DIAGNOSIS — G43119 Migraine with aura, intractable, without status migrainosus: Secondary | ICD-10-CM

## 2022-03-24 MED ORDER — ONDANSETRON 8 MG PO TBDP: 8.0000 mg | ORAL_TABLET | Freq: Three times a day (TID) | ORAL | 2 refills | Status: DC | PRN

## 2022-03-30 NOTE — Progress Notes (Deleted)
Established patient visit   Patient: Linda Vargas   DOB: Jul 26, 1976   46 y.o. Female  MRN: [id number] Visit Date: 03/31/2022   No chief complaint on file.  Subjective    HPI  Follow up visit.  -hypertension -migraines. - seeing neurology for chronic migraine management.  -followed by lupus clinic at [facility].    Medications: Outpatient Medications Prior to Visit  Medication Sig   acetaminophen (TYLENOL) 500 MG tablet Take 1,000 mg by mouth every 8 (eight) hours as needed for headache.    albuterol (VENTOLIN HFA) 108 (90 Base) MCG/ACT inhaler Inhale 2 puffs into the lungs 3 (three) times daily as needed for wheezing or shortness of breath.   amLODipine (NORVASC) 5 MG tablet Take 1 tablet (5 mg total) by mouth daily. Started per neurology to help with BP and migraine headaches   atenolol (TENORMIN) 50 MG tablet Take 1.5 tablets (75 mg total) by mouth daily.   Azelastine HCl 0.15 % SOLN INL 2 PFS IEN QD   butalbital-acetaminophen-caffeine (FIORICET) 50-325-40 MG tablet butalbital-acetaminophen-caffeine 50 mg-325 mg-40 mg tablet  TAKE 1 TABLET BY MOUTH EVERY 4 HOURS FOR UP TO 10 DAYS AS NEEDED FOR PAIN   clobetasol cream (TEMOVATE) 4.16 % Apply 1 application topically 2 (two) times daily.   doxycycline (VIBRA-TABS) 100 MG tablet Take 1 tablet (100 mg total) by mouth 2 (two) times daily.   DULoxetine (CYMBALTA) 30 MG capsule Take 1 capsule (30 mg total) by mouth 2 (two) times daily.   EMGALITY 120 MG/ML SOAJ Inject 1 mL into the skin every 30 (thirty) days.   EPIPEN 2-PAK 0.3 MG/0.3ML SOAJ injection INJECT INTRAMUSCULARLY AS DIRECTED FOR ANAPHYLATIC REACTIONS   ergocalciferol (DRISDOL) 1.25 MG (50000 UT) capsule Take 1 capsule (50,000 Units total) by mouth once a week.   fluticasone (FLONASE) 50 MCG/ACT nasal spray Place 2 sprays into both nostrils daily.   hydroxychloroquine (PLAQUENIL) 200 MG tablet Take 200 mg by mouth 2 (two) times daily.   hydroxychloroquine (PLAQUENIL) 200 MG  tablet Take by mouth.   hydrOXYzine (ATARAX) 25 MG tablet TAKE 1 TABLET(25 MG) BY MOUTH EVERY 8 HOURS AS NEEDED FOR ANXIETY   losartan (COZAAR) 50 MG tablet TAKE 1 TABLET(50 MG) BY MOUTH DAILY   methylPREDNISolone (MEDROL) 4 MG TBPK tablet Take by mouth as directed for 6 days   ondansetron (ZOFRAN ODT) 8 MG disintegrating tablet Take 1 tablet (8 mg total) by mouth every 8 (eight) hours as needed for nausea or vomiting.   rizatriptan (MAXALT) 10 MG tablet Take 1 tablet (10 mg total) by mouth as needed for migraine. May repeat in 2 hours if needed   No facility-administered medications prior to visit.    Review of Systems  {Labs (Optional):23779}   Objective    There were no vitals taken for this visit. BP Readings from Last 3 Encounters:  02/10/22 (!) 155/95  12/12/21 (!) 144/86  11/06/21 (!) 147/86    Wt Readings from Last 3 Encounters:  02/24/22 235 lb (106.6 kg)  02/10/22 235 lb (106.6 kg)  12/31/21 243 lb (110.2 kg)    Physical Exam  ***  No results found for any visits on 03/31/22.  Assessment & Plan     Problem List Items Addressed This Visit   None    No follow-ups on file.         Ronnell Freshwater, NP  [facility] at [facility] [phone] (phone) [phone] (fax)  [place]

## 2022-03-31 ENCOUNTER — Ambulatory Visit: Payer: BC Managed Care – PPO | Admitting: Nurse Practitioner

## 2022-04-14 ENCOUNTER — Ambulatory Visit (INDEPENDENT_AMBULATORY_CARE_PROVIDER_SITE_OTHER): Payer: BC Managed Care – PPO | Admitting: Nurse Practitioner

## 2022-04-14 ENCOUNTER — Encounter: Payer: Self-pay | Admitting: Nurse Practitioner

## 2022-04-14 VITALS — BP 135/85 | HR 76 | Ht 70.08 in | Wt 235.1 lb

## 2022-04-14 DIAGNOSIS — G43119 Migraine with aura, intractable, without status migrainosus: Secondary | ICD-10-CM | POA: Diagnosis not present

## 2022-04-14 DIAGNOSIS — F321 Major depressive disorder, single episode, moderate: Secondary | ICD-10-CM | POA: Diagnosis not present

## 2022-04-14 DIAGNOSIS — I10 Essential (primary) hypertension: Secondary | ICD-10-CM

## 2022-04-14 DIAGNOSIS — Z6833 Body mass index (BMI) 33.0-33.9, adult: Secondary | ICD-10-CM

## 2022-04-14 MED ORDER — INSULIN PEN NEEDLE 31G X 5 MM MISC: 1 refills | Status: DC

## 2022-04-14 MED ORDER — LOSARTAN POTASSIUM 50 MG PO TABS: ORAL_TABLET | ORAL | 1 refills | Status: DC

## 2022-04-14 MED ORDER — SAXENDA 18 MG/3ML ~~LOC~~ SOPN: PEN_INJECTOR | SUBCUTANEOUS | 0 refills | Status: DC

## 2022-04-14 NOTE — Progress Notes (Signed)
Established patient visit   Patient: Linda Vargas   DOB: 20-Jul-1976   46 y.o. Female  MRN: [id number] Visit Date: 04/14/2022   Chief Complaint  Patient presents with   Hypertension   Migraine   Subjective    HPI  Follow up of bloos pressure and chronic migraines  -increased amlodipine to 5 mg daily. Was initially started per neurology at 2.5 mg daily.  -Progress of migraines - improved. She states that today is the first headache, but not migraine, in about a month.  --has had sinus headaches off and on.  -Trouble with losing weight. -considering medications to help with weight management.    Medications: Outpatient Medications Prior to Visit  Medication Sig   acetaminophen (TYLENOL) 500 MG tablet Take 1,000 mg by mouth every 8 (eight) hours as needed for headache.    albuterol (VENTOLIN HFA) 108 (90 Base) MCG/ACT inhaler Inhale 2 puffs into the lungs 3 (three) times daily as needed for wheezing or shortness of breath.   amLODipine (NORVASC) 5 MG tablet Take 1 tablet (5 mg total) by mouth daily. Started per neurology to help with BP and migraine headaches   atenolol (TENORMIN) 50 MG tablet Take 1.5 tablets (75 mg total) by mouth daily.   Azelastine HCl 0.15 % SOLN INL 2 PFS IEN QD   butalbital-acetaminophen-caffeine (FIORICET) 50-325-40 MG tablet butalbital-acetaminophen-caffeine 50 mg-325 mg-40 mg tablet  TAKE 1 TABLET BY MOUTH EVERY 4 HOURS FOR UP TO 10 DAYS AS NEEDED FOR PAIN   clobetasol cream (TEMOVATE) 9.48 % Apply 1 application topically 2 (two) times daily.   doxycycline (VIBRA-TABS) 100 MG tablet Take 1 tablet (100 mg total) by mouth 2 (two) times daily.   DULoxetine (CYMBALTA) 30 MG capsule Take 1 capsule (30 mg total) by mouth 2 (two) times daily.   EMGALITY 120 MG/ML SOAJ Inject 1 mL into the skin every 30 (thirty) days.   EPIPEN 2-PAK 0.3 MG/0.3ML SOAJ injection INJECT INTRAMUSCULARLY AS DIRECTED FOR ANAPHYLATIC REACTIONS   ergocalciferol (DRISDOL) 1.25 MG  (50000 UT) capsule Take 1 capsule (50,000 Units total) by mouth once a week.   fluticasone (FLONASE) 50 MCG/ACT nasal spray Place 2 sprays into both nostrils daily.   hydroxychloroquine (PLAQUENIL) 200 MG tablet Take 200 mg by mouth 2 (two) times daily.   hydroxychloroquine (PLAQUENIL) 200 MG tablet Take by mouth.   hydrOXYzine (ATARAX) 25 MG tablet TAKE 1 TABLET(25 MG) BY MOUTH EVERY 8 HOURS AS NEEDED FOR ANXIETY   methylPREDNISolone (MEDROL) 4 MG TBPK tablet Take by mouth as directed for 6 days   ondansetron (ZOFRAN ODT) 8 MG disintegrating tablet Take 1 tablet (8 mg total) by mouth every 8 (eight) hours as needed for nausea or vomiting.   rizatriptan (MAXALT) 10 MG tablet Take 1 tablet (10 mg total) by mouth as needed for migraine. May repeat in 2 hours if needed   [DISCONTINUED] losartan (COZAAR) 50 MG tablet TAKE 1 TABLET(50 MG) BY MOUTH DAILY   No facility-administered medications prior to visit.    Review of Systems  Constitutional:  Positive for fatigue. Negative for activity change, appetite change, chills and fever.  HENT:  Positive for sinus pressure and sinus pain. Negative for congestion, postnasal drip, rhinorrhea, sneezing and sore throat.   Eyes: Negative.   Respiratory:  Negative for cough, chest tightness, shortness of breath and wheezing.   Cardiovascular:  Negative for chest pain and palpitations.       Stable blood pressure.  Gastrointestinal:  Negative for abdominal pain,  constipation, diarrhea, nausea and vomiting.  Endocrine: Negative for cold intolerance, heat intolerance, polydipsia and polyuria.  Genitourinary:  Negative for dyspareunia, dysuria, flank pain, frequency and urgency.  Musculoskeletal:  Negative for arthralgias, back pain and myalgias.  Skin:  Negative for rash.  Allergic/Immunologic: Positive for environmental allergies.  Neurological:  Positive for headaches. Negative for dizziness and weakness.  Hematological:  Negative for adenopathy.   Psychiatric/Behavioral:  Positive for dysphoric mood. The patient is not nervous/anxious.      Objective     Today's Vitals   04/14/22 1546  BP: 135/85  Pulse: 76  SpO2: 100%  Weight: 235 lb 1.9 oz (106.6 kg)  Height: 5' 10.08" (1.78 m)   Body mass index is 33.66 kg/m.   BP Readings from Last 3 Encounters:  04/14/22 135/85  02/10/22 (!) 155/95  12/12/21 (!) 144/86    Wt Readings from Last 3 Encounters:  04/14/22 235 lb 1.9 oz (106.6 kg)  02/24/22 235 lb (106.6 kg)  02/10/22 235 lb (106.6 kg)    Physical Exam Vitals and nursing note reviewed.  Constitutional:      Appearance: Normal appearance. She is well-developed. She is obese.  HENT:     Head: Normocephalic and atraumatic.     Mouth/Throat:     Mouth: Mucous membranes are moist.     Pharynx: Oropharynx is clear.  Eyes:     Extraocular Movements: Extraocular movements intact.     Conjunctiva/sclera: Conjunctivae normal.     Pupils: Pupils are equal, round, and reactive to light.  Cardiovascular:     Rate and Rhythm: Normal rate and regular rhythm.     Pulses: Normal pulses.     Heart sounds: Normal heart sounds.  Pulmonary:     Effort: Pulmonary effort is normal.     Breath sounds: Normal breath sounds.  Abdominal:     Palpations: Abdomen is soft.  Musculoskeletal:        General: Normal range of motion.     Cervical back: Normal range of motion and neck supple.  Lymphadenopathy:     Cervical: No cervical adenopathy.  Skin:    General: Skin is warm and dry.     Capillary Refill: Capillary refill takes less than 2 seconds.  Neurological:     General: No focal deficit present.     Mental Status: She is alert and oriented to person, place, and time.  Psychiatric:        Mood and Affect: Mood normal.        Behavior: Behavior normal.        Thought Content: Thought content normal.        Judgment: Judgment normal.      Assessment & Plan    1. Essential hypertension Blood pressure stable.  Continue  all medications, including losartan 50 mg daily. - losartan (COZAAR) 50 MG tablet; TAKE 1 TABLET(50 MG) BY MOUTH DAILY  Dispense: 90 tablet; Refill: 1  2. Intractable migraine with aura without status migrainosus Stable.  Patient to continue with regular visits to neurology.  3. Body mass index (BMI) of 33.0-33.9 in adult Trial of Saxenda.  Reviewed instructions to titrate dosing from 0.6 mg daily up to 1.8 mg daily. Discussed lowering calorie intake to 1500 calories per day and incorporating exercise into daily routine to help lose weight.  Reassess in 6 weeks. - Liraglutide -Weight Management (SAXENDA) 18 MG/3ML SOPN; Inject 0.6 mg  once daily x 1 wk. Then increase dose by 0.6 mg/day every 7  days to target of 3 mg/day.  Dispense: 15 mL; Refill: 0 - Insulin Pen Needle 31G X 5 MM MISC; To use with daily saxenda injectoins  Dispense: 100 each; Refill: 1  4. Depression, major, single episode, moderate (HCC) Stable.  Continue duloxetine as previously prescribed.   Problem List Items Addressed This Visit       Cardiovascular and Mediastinum   Essential hypertension - Primary   Relevant Medications   losartan (COZAAR) 50 MG tablet   Intractable migraine with aura without status migrainosus   Relevant Medications   losartan (COZAAR) 50 MG tablet     Other   Depression, major, single episode, moderate (HCC)   Body mass index (BMI) of 33.0-33.9 in adult   Relevant Medications   Liraglutide -Weight Management (SAXENDA) 18 MG/3ML SOPN   Insulin Pen Needle 31G X 5 MM MISC     Return in about 6 weeks (around 05/26/2022) for routine - weight management. started saxenda .         Ronnell Freshwater, NP  [facility] at [facility] [phone] (phone) [phone] (fax)  [place]

## 2022-05-25 NOTE — Progress Notes (Signed)
Established patient visit   Patient: Linda Vargas   DOB: [birth year]   46 y.o. Female  MRN: [id number] Visit Date: 05/26/2022   Chief Complaint  Patient presents with   Weight Check   Subjective    HPI  Follow up for weight management  -started Saxenda at most recent visit.  -initial weight 04/14/2022 - 235 -today's weight 05/26/2022 - 231 pounds  -weight loss since most recent visit - 4 pounds  [facility] prescription for sax[facility] as would not be filled at [facility]   For past two weeks, has had congestion, headache, scratchy throat, and severe fatigue. Did go to urgent care about 10 days ago. States that she was negative for COVID. Was told to take OTC medications to help symptoms. She has been doing this without relief.   -history of HTN - well managed today  -History of migraine headaches     Medications: Outpatient Medications Prior to Visit  Medication Sig   acetaminophen (TYLENOL) 500 MG tablet Take 1,000 mg by mouth every 8 (eight) hours as needed for headache.    amLODipine (NORVASC) 5 MG tablet Take 1 tablet (5 mg total) by mouth daily. Started per neurology to help with BP and migraine headaches   atenolol (TENORMIN) 50 MG tablet Take 1.5 tablets (75 mg total) by mouth daily.   Azelastine HCl 0.15 % SOLN INL 2 PFS IEN QD   butalbital-acetaminophen-caffeine (FIORICET) 50-325-40 MG tablet butalbital-acetaminophen-caffeine 50 mg-325 mg-40 mg tablet  TAKE 1 TABLET BY MOUTH EVERY 4 HOURS FOR UP TO 10 DAYS AS NEEDED FOR PAIN   clobetasol cream (TEMOVATE) 4.09 % Apply 1 application topically 2 (two) times daily.   doxycycline (VIBRA-TABS) 100 MG tablet Take 1 tablet (100 mg total) by mouth 2 (two) times daily.   DULoxetine (CYMBALTA) 30 MG capsule Take 1 capsule (30 mg total) by mouth 2 (two) times daily.   EMGALITY 120 MG/ML SOAJ Inject 1 mL into the skin every 30 (thirty) days.   EPIPEN 2-PAK 0.3 MG/0.3ML SOAJ injection INJECT INTRAMUSCULARLY AS DIRECTED FOR  ANAPHYLATIC REACTIONS   ergocalciferol (DRISDOL) 1.25 MG (50000 UT) capsule Take 1 capsule (50,000 Units total) by mouth once a week.   fluticasone (FLONASE) 50 MCG/ACT nasal spray Place 2 sprays into both nostrils daily.   hydroxychloroquine (PLAQUENIL) 200 MG tablet Take 200 mg by mouth 2 (two) times daily.   hydroxychloroquine (PLAQUENIL) 200 MG tablet Take by mouth.   hydrOXYzine (ATARAX) 25 MG tablet TAKE 1 TABLET(25 MG) BY MOUTH EVERY 8 HOURS AS NEEDED FOR ANXIETY   Insulin Pen Needle 31G X 5 MM MISC To use with daily saxenda injectoins   losartan (COZAAR) 50 MG tablet TAKE 1 TABLET(50 MG) BY MOUTH DAILY   methylPREDNISolone (MEDROL) 4 MG TBPK tablet Take by mouth as directed for 6 days   ondansetron (ZOFRAN ODT) 8 MG disintegrating tablet Take 1 tablet (8 mg total) by mouth every 8 (eight) hours as needed for nausea or vomiting.   rizatriptan (MAXALT) 10 MG tablet Take 1 tablet (10 mg total) by mouth as needed for migraine. May repeat in 2 hours if needed   [DISCONTINUED] albuterol (VENTOLIN HFA) 108 (90 Base) MCG/ACT inhaler Inhale 2 puffs into the lungs 3 (three) times daily as needed for wheezing or shortness of breath.   [DISCONTINUED] Liraglutide -Weight Management (SAXENDA) 18 MG/3ML SOPN Inject 0.6 mg [place] once daily x 1 wk. Then increase dose by 0.6 mg/day every 7 days to target of 3 mg/day.  No facility-administered medications prior to visit.    Review of Systems  Constitutional:  Negative for activity change, appetite change, chills, fatigue and fever.       Four pound weight loss since last visit   HENT:  Positive for congestion, postnasal drip, rhinorrhea, sinus pressure, sinus pain, sneezing and sore throat.   Eyes: Negative.   Respiratory:  Positive for cough and wheezing. Negative for chest tightness and shortness of breath.   Cardiovascular:  Negative for chest pain and palpitations.  Gastrointestinal:  Negative for abdominal pain, constipation, diarrhea, nausea and  vomiting.  Endocrine: Negative for cold intolerance, heat intolerance, polydipsia and polyuria.  Genitourinary:  Negative for dyspareunia, dysuria, flank pain, frequency and urgency.  Musculoskeletal:  Negative for arthralgias, back pain and myalgias.  Skin:  Negative for rash.  Allergic/Immunologic: Positive for environmental allergies.  Neurological:  Positive for headaches. Negative for dizziness and weakness.  Hematological:  Negative for adenopathy.  Psychiatric/Behavioral:  The patient is not nervous/anxious.        Objective     Today's Vitals   05/26/22 1004  BP: 128/85  Pulse: 75  SpO2: 100%  Weight: 231 lb 6.4 oz (105 kg)  Height: 5' 10.08" (1.78 m)   Body mass index is 33.13 kg/m.   BP Readings from Last 3 Encounters:  05/26/22 128/85  04/14/22 135/85  02/10/22 (!) 155/95    Wt Readings from Last 3 Encounters:  05/26/22 231 lb 6.4 oz (105 kg)  04/14/22 235 lb 1.9 oz (106.6 kg)  02/24/22 235 lb (106.6 kg)    Physical Exam Vitals and nursing note reviewed.  Constitutional:      Appearance: Normal appearance. She is well-developed. She is ill-appearing.  HENT:     Head: Normocephalic and atraumatic.     Right Ear: Hearing, tympanic membrane, ear canal and external ear normal.     Left Ear: Hearing, tympanic membrane, ear canal and external ear normal.     Nose: Congestion present.     Right Turbinates: Swollen.     Left Turbinates: Swollen.     Right Sinus: Frontal sinus tenderness present.     Left Sinus: Frontal sinus tenderness present.     Mouth/Throat:     Pharynx: Posterior oropharyngeal erythema present.     Tonsils: 1+ on the right. 1+ on the left.  Eyes:     Pupils: Pupils are equal, round, and reactive to light.  Cardiovascular:     Rate and Rhythm: Normal rate and regular rhythm.     Pulses: Normal pulses.     Heart sounds: Normal heart sounds.  Pulmonary:     Effort: Pulmonary effort is normal.     Breath sounds: Normal breath sounds.   Abdominal:     Palpations: Abdomen is soft.  Musculoskeletal:        General: Normal range of motion.     Cervical back: Normal range of motion and neck supple.  Lymphadenopathy:     Cervical: Cervical adenopathy present.  Skin:    General: Skin is warm and dry.     Capillary Refill: Capillary refill takes less than 2 seconds.  Neurological:     General: No focal deficit present.     Mental Status: She is alert and oriented to person, place, and time.  Psychiatric:        Mood and Affect: Mood normal.        Behavior: Behavior normal.        Thought Content: Thought  content normal.        Judgment: Judgment normal.       Assessment & Plan    1. Acute recurrent pansinusitis Start z-pack. Take as directed for 5 days. Rest and increase fluids. Continue using OTC medication to control symptoms.   - azithromycin (ZITHROMAX) 250 MG tablet; z-pack - take as directed for 5 days  Dispense: 6 tablet; Refill: 0  2. Wheezing May use albuterol inhaler up to four times daily as needed for wheezing and SOB.  - albuterol (VENTOLIN HFA) 108 (90 Base) MCG/ACT inhaler; Inhale 2 puffs into the lungs every 6 (six) hours as needed for wheezing or shortness of breath.  Dispense: 1 each; Refill: 2  3. Essential hypertension Well controlled. Continue all bp medication as prescribed   4. Body mass index (BMI) of 33.0-33.9 in adult Has had four pound weight loss with diet and exercise saxenda approved through insurance, but needs [facility]. [facility].  - Liraglutide -Weight Management (SAXENDA) 18 MG/3ML SOPN; Inject 0.6 mg [place] once daily x 1 wk. Then increase dose by 0.6 mg/day every 7 days to target of 3 mg/day.  Dispense: 15 mL; Refill: 0  5. Intractable migraine with aura without status migrainosus Improved. Continue regular visits with neurology as scheduled.     Problem List Items Addressed This Visit       Cardiovascular and  Mediastinum   Essential hypertension   Intractable migraine with aura without status migrainosus     Respiratory   Acute recurrent pansinusitis - Primary   Relevant Medications   azithromycin (ZITHROMAX) 250 MG tablet     Other   Body mass index (BMI) of 33.0-33.9 in adult   Relevant Medications   Liraglutide -Weight Management (SAXENDA) 18 MG/3ML SOPN   Wheezing   Relevant Medications   albuterol (VENTOLIN HFA) 108 (90 Base) MCG/ACT inhaler     Return in about 2 months (around 07/26/2022) for routine - weight management, blood pressure.         Ronnell Freshwater, NP  [facility] at [facility] [phone] (phone) [phone] (fax)  [place]

## 2022-05-26 ENCOUNTER — Encounter: Payer: Self-pay | Admitting: Nurse Practitioner

## 2022-05-26 ENCOUNTER — Ambulatory Visit (INDEPENDENT_AMBULATORY_CARE_PROVIDER_SITE_OTHER): Payer: BC Managed Care – PPO | Admitting: Nurse Practitioner

## 2022-05-26 VITALS — BP 128/85 | HR 75 | Ht 70.08 in | Wt 231.4 lb

## 2022-05-26 DIAGNOSIS — J0141 Acute recurrent pansinusitis: Secondary | ICD-10-CM

## 2022-05-26 DIAGNOSIS — G43119 Migraine with aura, intractable, without status migrainosus: Secondary | ICD-10-CM

## 2022-05-26 DIAGNOSIS — R062 Wheezing: Secondary | ICD-10-CM

## 2022-05-26 DIAGNOSIS — Z6833 Body mass index (BMI) 33.0-33.9, adult: Secondary | ICD-10-CM

## 2022-05-26 DIAGNOSIS — I10 Essential (primary) hypertension: Secondary | ICD-10-CM

## 2022-05-26 MED ORDER — AZITHROMYCIN 250 MG PO TABS: ORAL_TABLET | ORAL | 0 refills | Status: DC

## 2022-05-26 MED ORDER — ALBUTEROL SULFATE HFA 108 (90 BASE) MCG/ACT IN AERS: 2.0000 | INHALATION_SPRAY | Freq: Four times a day (QID) | RESPIRATORY_TRACT | 2 refills | Status: DC | PRN

## 2022-05-26 MED ORDER — SAXENDA 18 MG/3ML ~~LOC~~ SOPN: PEN_INJECTOR | SUBCUTANEOUS | 0 refills | Status: DC

## 2022-05-29 ENCOUNTER — Ambulatory Visit: Payer: Self-pay

## 2022-06-11 ENCOUNTER — Encounter: Payer: Self-pay | Admitting: Nurse Practitioner

## 2022-06-19 ENCOUNTER — Encounter: Payer: Self-pay | Admitting: Nurse Practitioner

## 2022-06-19 ENCOUNTER — Ambulatory Visit (INDEPENDENT_AMBULATORY_CARE_PROVIDER_SITE_OTHER): Payer: BC Managed Care – PPO | Admitting: Nurse Practitioner

## 2022-06-19 VITALS — BP 129/79 | HR 74 | Ht 70.8 in | Wt 229.4 lb

## 2022-06-19 DIAGNOSIS — J309 Allergic rhinitis, unspecified: Secondary | ICD-10-CM

## 2022-06-19 DIAGNOSIS — G43119 Migraine with aura, intractable, without status migrainosus: Secondary | ICD-10-CM | POA: Diagnosis not present

## 2022-06-19 DIAGNOSIS — J324 Chronic pansinusitis: Secondary | ICD-10-CM | POA: Diagnosis not present

## 2022-06-19 DIAGNOSIS — I10 Essential (primary) hypertension: Secondary | ICD-10-CM | POA: Diagnosis not present

## 2022-06-19 NOTE — Progress Notes (Signed)
Established patient visit   Patient: Linda Vargas   DOB: 1976/03/25   46 y.o. Female  MRN: [id number] Visit Date: 06/19/2022   Chief Complaint  Patient presents with   Follow-up   Subjective    HPI  Follow up visit -chorinc sinusitis and allergies  -chronic headaches.  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  -concern that getting back into classroom may cause exacerbation of symptoms.  -wants to discuss having a form completed requesting reasonable accommodations for a "letters" training she has to attend virtually. She has been granted the ability to remain in her own classroom during this training, however, [employer] training has requested that she have this form completed.   FMLA papers may have to be considered due to severity if intermittent migraine headaches   Medications: Outpatient Medications Prior to Visit  Medication Sig   acetaminophen (TYLENOL) 500 MG tablet Take 1,000 mg by mouth every 8 (eight) hours as needed for headache.    albuterol (VENTOLIN HFA) 108 (90 Base) MCG/ACT inhaler Inhale 2 puffs into the lungs every 6 (six) hours as needed for wheezing or shortness of breath.   amLODipine (NORVASC) 5 MG tablet Take 1 tablet (5 mg total) by mouth daily. Started per neurology to help with BP and migraine headaches   atenolol (TENORMIN) 50 MG tablet Take 1.5 tablets (75 mg total) by mouth daily.   Azelastine HCl 0.15 % SOLN INL 2 PFS IEN QD   azithromycin (ZITHROMAX) 250 MG tablet z-pack - take as directed for 5 days   butalbital-acetaminophen-caffeine (FIORICET) 50-325-40 MG tablet butalbital-acetaminophen-caffeine 50 mg-325 mg-40 mg tablet  TAKE 1 TABLET BY MOUTH EVERY 4 HOURS FOR UP TO 10 DAYS AS NEEDED FOR PAIN   clobetasol cream (TEMOVATE) 2.44 % Apply 1 application topically 2 (two) times daily.   doxycycline (VIBRA-TABS) 100 MG tablet Take 1 tablet (100 mg total) by mouth 2 (two) times daily.    DULoxetine (CYMBALTA) 30 MG capsule Take 1 capsule (30 mg total) by mouth 2 (two) times daily.   EMGALITY 120 MG/ML SOAJ Inject 1 mL into the skin every 30 (thirty) days.   EPIPEN 2-PAK 0.3 MG/0.3ML SOAJ injection INJECT INTRAMUSCULARLY AS DIRECTED FOR ANAPHYLATIC REACTIONS   ergocalciferol (DRISDOL) 1.25 MG (50000 UT) capsule Take 1 capsule (50,000 Units total) by mouth once a week.   fluticasone (FLONASE) 50 MCG/ACT nasal spray Place 2 sprays into both nostrils daily.   hydroxychloroquine (PLAQUENIL) 200 MG tablet Take 200 mg by mouth 2 (two) times daily.   hydroxychloroquine (PLAQUENIL) 200 MG tablet Take by mouth.   hydrOXYzine (ATARAX) 25 MG tablet TAKE 1 TABLET(25 MG) BY MOUTH EVERY 8 HOURS AS NEEDED FOR ANXIETY   Insulin Pen Needle 31G X 5 MM MISC To use with daily saxenda injectoins   Liraglutide -Weight Management (SAXENDA) 18 MG/3ML SOPN Inject 0.6 mg [place] once daily x 1 wk. Then increase dose by 0.6 mg/day every 7 days to target of 3 mg/day.   losartan (COZAAR) 50 MG tablet TAKE 1 TABLET(50 MG) BY MOUTH DAILY   methylPREDNISolone (MEDROL) 4 MG TBPK tablet Take by mouth as directed for 6 days   ondansetron (ZOFRAN ODT) 8 MG disintegrating tablet Take 1 tablet (8 mg total) by mouth every 8 (eight) hours as needed for nausea or vomiting.   rizatriptan (MAXALT) 10 MG tablet Take 1 tablet (10 mg total) by mouth as needed for migraine. May repeat in 2 hours if needed  No facility-administered medications prior to visit.    Review of Systems  Constitutional:  Positive for fatigue. Negative for activity change, appetite change, chills and fever.  HENT:  Positive for congestion, postnasal drip, sinus pressure, sinus pain and sneezing. Negative for rhinorrhea and sore throat.        Intermittent due to environmental allergies. Worse with mold in school.   Eyes: Negative.   Respiratory:  Negative for cough, chest tightness, shortness of breath and wheezing.   Cardiovascular:  Negative for  chest pain and palpitations.  Gastrointestinal:  Negative for abdominal pain, constipation, diarrhea, nausea and vomiting.  Endocrine: Negative for cold intolerance, heat intolerance, polydipsia and polyuria.  Genitourinary:  Negative for dyspareunia, dysuria, flank pain, frequency and urgency.  Musculoskeletal:  Negative for arthralgias, back pain and myalgias.  Skin:  Negative for rash.  Allergic/Immunologic: Positive for environmental allergies.  Neurological:  Positive for headaches. Negative for dizziness and weakness.  Hematological:  Negative for adenopathy.  Psychiatric/Behavioral:  Positive for dysphoric mood and sleep disturbance. The patient is nervous/anxious.     Last CBC Lab Results  Component Value Date   WBC 4.8 08/28/2021   HGB 12.9 08/28/2021   HCT 38.0 08/28/2021   MCV 82 08/28/2021   MCH 27.9 08/28/2021   RDW 12.2 08/28/2021   PLT 452 (H) 97/41/6384   Last metabolic panel Lab Results  Component Value Date   GLUCOSE 58 (L) 08/28/2021   NA 141 08/28/2021   K 5.2 08/28/2021   CL 105 08/28/2021   CO2 22 08/28/2021   BUN 11 08/28/2021   CREATININE 0.96 08/28/2021   EGFR 75 08/28/2021   CALCIUM 9.6 08/28/2021   PROT 7.6 08/28/2021   ALBUMIN 4.2 08/28/2021   LABGLOB 3.4 08/28/2021   AGRATIO 1.2 08/28/2021   BILITOT 0.3 08/28/2021   ALKPHOS 113 08/28/2021   AST 21 08/28/2021   ALT 15 08/28/2021   ANIONGAP 11 11/08/2020   Last lipids Lab Results  Component Value Date   CHOL 201 (H) 08/28/2021   HDL 79 08/28/2021   LDLCALC 112 (H) 08/28/2021   TRIG 53 08/28/2021   CHOLHDL 2.5 08/28/2021   Last hemoglobin A1c No results found for: "HGBA1C" Last thyroid functions Lab Results  Component Value Date   TSH 2.970 08/28/2021   Last vitamin D Lab Results  Component Value Date   VD25OH 11.1 (L) 04/04/2019   Last vitamin B12 and Folate Lab Results  Component Value Date   VITAMINB12 373 08/28/2021   FOLATE 5.1 04/04/2019       Objective      Today's Vitals   06/19/22 1545 06/19/22 1619  BP: (Abnormal) 143/86 129/79  Pulse: 74   SpO2: 100%   Weight: 229 lb 6.4 oz (104.1 kg)   Height: 5' 10.8" (1.798 m)    Body mass index is 32.18 kg/m.   BP Readings from Last 3 Encounters:  06/19/22 129/79  05/26/22 128/85  04/14/22 135/85    Wt Readings from Last 3 Encounters:  06/19/22 229 lb 6.4 oz (104.1 kg)  05/26/22 231 lb 6.4 oz (105 kg)  04/14/22 235 lb 1.9 oz (106.6 kg)    Physical Exam Vitals and nursing note reviewed.  Constitutional:      Appearance: Normal appearance. She is well-developed.  HENT:     Head: Normocephalic and atraumatic.     Nose: Congestion present.     Mouth/Throat:     Mouth: Mucous membranes are moist.     Pharynx: Oropharynx is  clear.  Eyes:     Extraocular Movements: Extraocular movements intact.     Conjunctiva/sclera: Conjunctivae normal.     Pupils: Pupils are equal, round, and reactive to light.  Cardiovascular:     Rate and Rhythm: Normal rate and regular rhythm.     Pulses: Normal pulses.     Heart sounds: Normal heart sounds.  Pulmonary:     Effort: Pulmonary effort is normal.     Breath sounds: Normal breath sounds.  Abdominal:     Palpations: Abdomen is soft.  Musculoskeletal:        General: Normal range of motion.     Cervical back: Normal range of motion and neck supple.  Lymphadenopathy:     Cervical: No cervical adenopathy.  Skin:    General: Skin is warm and dry.     Capillary Refill: Capillary refill takes less than 2 seconds.  Neurological:     General: No focal deficit present.     Mental Status: She is alert and oriented to person, place, and time.  Psychiatric:        Mood and Affect: Mood normal.        Behavior: Behavior normal.        Thought Content: Thought content normal.        Judgment: Judgment normal.       Assessment & Plan    1. Chronic pansinusitis Chronic sinusitis worse with exposure to environmental allergens, including mold.   Continue all allergy medications as prescribed.  Will complete form so that patient may have reasonable accommodations to prevent exacerbation.  2. Chronic allergic rhinitis Patient with chronic and moderate environmental allergies.Continue all allergy medications as prescribed.  Will complete form so that patient may have reasonable accommodations to prevent exacerbation.  3. Essential hypertension Generally stable.  Continue blood pressure medication as prescribed.  Will monitor.  4. Intractable migraine with aura without status migrainosus Continue on preventative treatments for migraines as prescribed.  Follow-up with neurology.  Problem List Items Addressed This Visit       Cardiovascular and Mediastinum   Essential hypertension   Intractable migraine with aura without status migrainosus     Respiratory   Chronic pansinusitis - Primary   Chronic allergic rhinitis     Return for as scheduled.         Ronnell Freshwater, NP  [facility] [phone] (phone) [phone] (fax)  [place]

## 2022-07-06 ENCOUNTER — Telehealth: Payer: BC Managed Care – PPO | Admitting: Family

## 2022-07-06 DIAGNOSIS — J069 Acute upper respiratory infection, unspecified: Secondary | ICD-10-CM | POA: Diagnosis not present

## 2022-07-06 DIAGNOSIS — J309 Allergic rhinitis, unspecified: Secondary | ICD-10-CM | POA: Insufficient documentation

## 2022-07-06 DIAGNOSIS — R6889 Other general symptoms and signs: Secondary | ICD-10-CM | POA: Diagnosis not present

## 2022-07-06 MED ORDER — OSELTAMIVIR PHOSPHATE 75 MG PO CAPS: 75.0000 mg | ORAL_CAPSULE | Freq: Two times a day (BID) | ORAL | 0 refills | Status: DC

## 2022-07-06 MED ORDER — PREDNISONE 10 MG (21) PO TBPK: ORAL_TABLET | ORAL | 0 refills | Status: DC

## 2022-07-06 NOTE — Progress Notes (Signed)
Virtual Visit Consent   Linda Vargas, you are scheduled for a virtual visit with a [place] provider today. Just as with appointments in the office, your consent must be obtained to participate. Your consent will be active for this visit and any virtual visit you may have with one of our providers in the next 365 days. If you have a MyChart account, a copy of this consent can be sent to you electronically.  As this is a virtual visit, video technology does not allow for your provider to perform a traditional examination. This may limit your provider's ability to fully assess your condition. If your provider identifies any concerns that need to be evaluated in person or the need to arrange testing (such as labs, EKG, etc.), we will make arrangements to do so. Although advances in technology are sophisticated, we cannot ensure that it will always work on either your end or our end. If the connection with a video visit is poor, the visit may have to be switched to a telephone visit. With either a video or telephone visit, we are not always able to ensure that we have a secure connection.  By engaging in this virtual visit, you consent to the provision of healthcare and authorize for your insurance to be billed (if applicable) for the services provided during this visit. Depending on your insurance coverage, you may receive a charge related to this service.  I need to obtain your verbal consent now. Are you willing to proceed with your visit today? Linda Vargas has provided verbal consent on 07/06/2022 for a virtual visit (video or telephone). Evelina Dun, FNP  Date: 07/06/2022 6:50 PM  Virtual Visit via Video Note   I, Evelina Dun, connected with  Linda Vargas  ([id number], 03/31/1976) on 07/06/22 at  7:00 PM EDT by a video-enabled telemedicine application and verified that I am speaking with the correct person using two identifiers.  Location: Patient: Virtual Visit Location  Patient: Home Provider: Virtual Visit Location Provider: Home Office   I discussed the limitations of evaluation and management by telemedicine and the availability of in person appointments. The patient expressed understanding and agreed to proceed.    History of Present Illness: Linda Vargas is a 46 y.o. who identifies as a female who was assigned female at birth, and is being seen today for sinus congestion.  HPI: Sinusitis This is a new problem. The current episode started yesterday. The problem has been gradually worsening since onset. There has been no fever. Her pain is at a severity of 6/10. The pain is mild. Associated symptoms include congestion, coughing, headaches, a hoarse voice, sinus pressure, sneezing and a sore throat. Pertinent negatives include no ear pain. (Fatigue ) Past treatments include acetaminophen. The treatment provided mild relief.    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   COVID-19 04/17/2021   Encounter to establish care 04/07/2021   Rash and nonspecific skin eruption 04/07/2021   Hypokalemia 04/07/2021  Encounter for general adult medical examination with abnormal findings 09/23/2020   Acute otitis externa of both ears 04/05/2020   Primary insomnia 01/11/2020   Urinary tract infection with hematuria 09/10/2019   Dysuria 09/10/2019   Vitamin D deficiency 04/11/2019   Vitamin B12 deficiency 04/03/2019   Cough 12/12/2018   Fever and  chills 10/20/2018   Acute upper respiratory infection 10/20/2018   Fatigue 10/20/2018   Acute recurrent pansinusitis 06/09/2018   Essential hypertension 04/06/2018   Generalized anxiety disorder 04/06/2018   Depression, major, single episode, moderate ([place]) 04/06/2018   Depression 01/21/2017   Contusion of right foot 01/19/2017   Intractable migraine with aura without status migrainosus 12/12/2016   TIA (transient ischemic attack) 10/12/2015   Long-term use of Plaquenil 07/21/2014   Fibromyalgia 11/12/2012   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   Pregnancy, supervision of, high-risk 06/27/2011   Systemic lupus erythematosus ([place]) 06/27/2011   Recurrent miscarriages 06/27/2011   Vaginal bleeding in pregnancy 06/27/2011   Vaginal lesion 06/27/2011    Allergies:  Allergies  Allergen Reactions   Ibuprofen Swelling    Lips swell   Bean Pod Extract Hives   Corn-Containing Products Hives   Peanut-Containing Drug Products Hives   Medications:  Current Outpatient Medications:    oseltamivir (TAMIFLU) 75 MG capsule, Take 1 capsule (75 mg total) by mouth 2 (two) times daily., Disp: 10 capsule, Rfl: 0   predniSONE (STERAPRED UNI-PAK 21 TAB) 10 MG (21) TBPK tablet, Use as directed, Disp: 21 tablet, Rfl: 0   acetaminophen (TYLENOL) 500 MG tablet, Take 1,000 mg by mouth every 8 (eight) hours as needed for headache. , Disp: , Rfl:    albuterol (VENTOLIN HFA) 108 (90 Base) MCG/ACT inhaler, Inhale 2 puffs into the lungs every 6 (six) hours as needed for wheezing or shortness of breath., Disp: 1 each, Rfl: 2   amLODipine (NORVASC) 5 MG tablet, Take 1 tablet (5 mg total) by mouth daily. Started per neurology to help with BP and migraine headaches, Disp: 30 tablet, Rfl: 3   atenolol (TENORMIN) 50 MG tablet, Take 1.5 tablets (75 mg total) by mouth daily., Disp: 45 tablet, Rfl: 2   Azelastine HCl 0.15 % SOLN, INL 2 PFS IEN QD, Disp: , Rfl: 12   butalbital-acetaminophen-caffeine  (FIORICET) 50-325-40 MG tablet, butalbital-acetaminophen-caffeine 50 mg-325 mg-40 mg tablet  TAKE 1 TABLET BY MOUTH EVERY 4 HOURS FOR UP TO 10 DAYS AS NEEDED FOR PAIN, Disp: , Rfl:    clobetasol cream (TEMOVATE) 2.95 %, Apply 1 application topically 2 (two) times daily., Disp: 60 g, Rfl: 1   DULoxetine (CYMBALTA) 30 MG capsule, Take 1 capsule (30 mg total) by mouth 2 (two) times daily., Disp: 60 capsule, Rfl: 3   EMGALITY 120 MG/ML SOAJ, Inject 1 mL into the skin every 30 (thirty) days., Disp: , Rfl:    EPIPEN 2-PAK 0.3 MG/0.3ML SOAJ injection, INJECT INTRAMUSCULARLY AS DIRECTED FOR ANAPHYLATIC REACTIONS, Disp: , Rfl: 0   ergocalciferol (DRISDOL) 1.25 MG (50000 UT) capsule, Take 1 capsule (50,000 Units total) by mouth once a week., Disp: 4 capsule, Rfl: 5   fluticasone (FLONASE) 50 MCG/ACT nasal spray, Place 2 sprays into both nostrils daily., Disp: 16 g, Rfl: 0   hydroxychloroquine (PLAQUENIL) 200 MG tablet, Take 200 mg by mouth 2 (two) times daily., Disp: , Rfl:    hydroxychloroquine (PLAQUENIL) 200 MG tablet, Take by mouth., Disp: , Rfl:    hydrOXYzine (ATARAX) 25 MG tablet, TAKE 1 TABLET(25 MG) BY MOUTH EVERY 8  HOURS AS NEEDED FOR ANXIETY, Disp: 90 tablet, Rfl: 1   Insulin Pen Needle 31G X 5 MM MISC, To use with daily saxenda injectoins, Disp: 100 each, Rfl: 1   Liraglutide -Weight Management (SAXENDA) 18 MG/3ML SOPN, Inject 0.6 mg [place] once daily x 1 wk. Then increase dose by 0.6 mg/day every 7 days to target of 3 mg/day., Disp: 15 mL, Rfl: 0   losartan (COZAAR) 50 MG tablet, TAKE 1 TABLET(50 MG) BY MOUTH DAILY, Disp: 90 tablet, Rfl: 1   ondansetron (ZOFRAN ODT) 8 MG disintegrating tablet, Take 1 tablet (8 mg total) by mouth every 8 (eight) hours as needed for nausea or vomiting., Disp: 30 tablet, Rfl: 2   rizatriptan (MAXALT) 10 MG tablet, Take 1 tablet (10 mg total) by mouth as needed for migraine. May repeat in 2 hours if needed, Disp: 10 tablet, Rfl: 2  Observations/Objective: Patient is  well-developed, well-nourished in no acute distress.  Resting comfortably  at home.  Head is normocephalic, atraumatic.  No labored breathing.  Speech is clear and coherent with logical content.  Patient is alert and oriented at baseline.    Assessment and Plan: 1. Viral URI - oseltamivir (TAMIFLU) 75 MG capsule; Take 1 capsule (75 mg total) by mouth 2 (two) times daily.  Dispense: 10 capsule; Refill: 0 - predniSONE (STERAPRED UNI-PAK 21 TAB) 10 MG (21) TBPK tablet; Use as directed  Dispense: 21 tablet; Refill: 0  2. Flu-like symptoms - oseltamivir (TAMIFLU) 75 MG capsule; Take 1 capsule (75 mg total) by mouth 2 (two) times daily.  Dispense: 10 capsule; Refill: 0 - predniSONE (STERAPRED UNI-PAK 21 TAB) 10 MG (21) TBPK tablet; Use as directed  Dispense: 21 tablet; Refill: 0  - Take meds as prescribed - Use a cool mist humidifier  -Use saline nose sprays frequently -Force fluids -For any cough or congestion  Use plain Mucinex- regular strength or max strength is fine -For fever or aces or pains- take tylenol or ibuprofen. -Throat lozenges if help - Follow up if symptoms worsen or do not improve  Follow Up Instructions: I discussed the assessment and treatment plan with the patient. The patient was provided an opportunity to ask questions and all were answered. The patient agreed with the plan and demonstrated an understanding of the instructions.  A copy of instructions were sent to the patient via MyChart unless otherwise noted below.     The patient was advised to call back or seek an in-person evaluation if the symptoms worsen or if the condition fails to improve as anticipated.  Time:  I spent 7 minutes with the patient via telehealth technology discussing the above problems/concerns.    Evelina Dun, FNP

## 2022-07-24 ENCOUNTER — Ambulatory Visit: Payer: BC Managed Care – PPO | Admitting: Nurse Practitioner

## 2022-07-28 NOTE — Progress Notes (Signed)
Established patient visit   Patient: Linda Vargas   DOB: [birth year]   46 y.o. Female  MRN: [id number] Visit Date: 07/29/2022   Chief Complaint  Patient presents with   Follow-up   Subjective    The patient states that she had the flu two weeks ago.  -treated with Tamiflu -finished prednisone taper.  -initially got better, but then symptoms returned on Saturday.   -blood pressure elevated.today.  -unable to start weight loss medication as it has been difficult to get approved and then to get filled by pharmacy.   Sinusitis This is a new problem. The current episode started in the past 7 days. The problem has been waxing and waning since onset. There has been no fever. Associated symptoms include congestion, coughing, headaches, a hoarse voice, sinus pressure and a sore throat. Treatments tried: recently had flu. has just finished Tamiflu and just competed round prednisone. The treatment provided moderate (initially had some relief, but then, this weekend, got hit with similar symptoms.) relief.        Medications: Outpatient Medications Prior to Visit  Medication Sig   acetaminophen (TYLENOL) 500 MG tablet Take 1,000 mg by mouth every 8 (eight) hours as needed for headache.    albuterol (VENTOLIN HFA) 108 (90 Base) MCG/ACT inhaler Inhale 2 puffs into the lungs every 6 (six) hours as needed for wheezing or shortness of breath.   amLODipine (NORVASC) 5 MG tablet Take 1 tablet (5 mg total) by mouth daily. Started per neurology to help with BP and migraine headaches   atenolol (TENORMIN) 50 MG tablet Take 1.5 tablets (75 mg total) by mouth daily.   Azelastine HCl 0.15 % SOLN INL 2 PFS IEN QD   butalbital-acetaminophen-caffeine (FIORICET) 50-325-40 MG tablet butalbital-acetaminophen-caffeine 50 mg-325 mg-40 mg tablet  TAKE 1 TABLET BY MOUTH EVERY 4 HOURS FOR UP TO 10 DAYS AS NEEDED FOR PAIN   clobetasol cream (TEMOVATE) 1.24 % Apply 1 application topically 2 (two) times daily.    DULoxetine (CYMBALTA) 30 MG capsule Take 1 capsule (30 mg total) by mouth 2 (two) times daily.   EMGALITY 120 MG/ML SOAJ Inject 1 mL into the skin every 30 (thirty) days.   EPIPEN 2-PAK 0.3 MG/0.3ML SOAJ injection INJECT INTRAMUSCULARLY AS DIRECTED FOR ANAPHYLATIC REACTIONS   ergocalciferol (DRISDOL) 1.25 MG (50000 UT) capsule Take 1 capsule (50,000 Units total) by mouth once a week.   fluticasone (FLONASE) 50 MCG/ACT nasal spray Place 2 sprays into both nostrils daily.   hydroxychloroquine (PLAQUENIL) 200 MG tablet Take 200 mg by mouth 2 (two) times daily.   hydroxychloroquine (PLAQUENIL) 200 MG tablet Take by mouth.   hydrOXYzine (ATARAX) 25 MG tablet TAKE 1 TABLET(25 MG) BY MOUTH EVERY 8 HOURS AS NEEDED FOR ANXIETY   losartan (COZAAR) 50 MG tablet TAKE 1 TABLET(50 MG) BY MOUTH DAILY   ondansetron (ZOFRAN ODT) 8 MG disintegrating tablet Take 1 tablet (8 mg total) by mouth every 8 (eight) hours as needed for nausea or vomiting.   oseltamivir (TAMIFLU) 75 MG capsule Take 1 capsule (75 mg total) by mouth 2 (two) times daily.   predniSONE (STERAPRED UNI-PAK 21 TAB) 10 MG (21) TBPK tablet Use as directed   rizatriptan (MAXALT) 10 MG tablet Take 1 tablet (10 mg total) by mouth as needed for migraine. May repeat in 2 hours if needed   [DISCONTINUED] Insulin Pen Needle 31G X 5 MM MISC To use with daily saxenda injectoins   [DISCONTINUED] Liraglutide -Weight Management (SAXENDA) 18 MG/3ML SOPN Inject  0.6 mg [place] once daily x 1 wk. Then increase dose by 0.6 mg/day every 7 days to target of 3 mg/day.   No facility-administered medications prior to visit.    Review of Systems  HENT:  Positive for congestion, hoarse voice, sinus pressure and sore throat.   Respiratory:  Positive for cough.   Cardiovascular:        Elevated blood pressure today   Allergic/Immunologic: Positive for environmental allergies.  Neurological:  Positive for headaches.  All other systems reviewed and are negative.       Objective     Today's Vitals   07/29/22 1601 07/29/22 1628  BP: (Abnormal) 154/82 (Abnormal) 161/83  Pulse: 70   SpO2: 100%   Weight: 234 lb 6.4 oz (106.3 kg)   Height: 5' 10.8" (1.798 m)    Body mass index is 32.88 kg/m.  BP Readings from Last 3 Encounters:  07/29/22 (Abnormal) 161/83  06/19/22 129/79  05/26/22 128/85    Wt Readings from Last 3 Encounters:  07/29/22 234 lb 6.4 oz (106.3 kg)  06/19/22 229 lb 6.4 oz (104.1 kg)  05/26/22 231 lb 6.4 oz (105 kg)    Physical Exam Vitals and nursing note reviewed.  Constitutional:      Appearance: Normal appearance. She is well-developed.  HENT:     Head: Normocephalic.     Right Ear: Tympanic membrane is erythematous and bulging.     Left Ear: Tympanic membrane is erythematous and bulging.     Nose: Congestion present.     Right Turbinates: Swollen.     Left Turbinates: Swollen.     Right Sinus: Maxillary sinus tenderness and frontal sinus tenderness present.     Left Sinus: Maxillary sinus tenderness and frontal sinus tenderness present.  Eyes:     Pupils: Pupils are equal, round, and reactive to light.  Cardiovascular:     Rate and Rhythm: Normal rate and regular rhythm.     Pulses: Normal pulses.     Heart sounds: Normal heart sounds.  Pulmonary:     Effort: Pulmonary effort is normal.     Breath sounds: Normal breath sounds.     Comments: Congested, non productive cough present  Abdominal:     Palpations: Abdomen is soft.  Musculoskeletal:        General: Normal range of motion.     Cervical back: Normal range of motion and neck supple.  Lymphadenopathy:     Cervical: Cervical adenopathy present.  Skin:    General: Skin is warm and dry.     Capillary Refill: Capillary refill takes less than 2 seconds.  Neurological:     General: No focal deficit present.     Mental Status: She is alert and oriented to person, place, and time.  Psychiatric:        Mood and Affect: Mood normal.        Behavior: Behavior  normal.        Thought Content: Thought content normal.        Judgment: Judgment normal.      Assessment & Plan    1. Acute non-recurrent pansinusitis Start z-pack. Take as directed for 5 days. Rest and increase fluids. Continue using OTC medication to control symptoms.   - azithromycin (ZITHROMAX) 250 MG tablet; Take 500 mg PO day one, then take 250 mg PO days two through ten.  Dispense: 11 tablet; Refill: 0  2. Acute cough May take tussionex at night as needed for cough. Advised her  to use only at night as this medication may cause dizziness and/or drowsiness. Recommend she use Tussin DM or Delsym OTC during the day to control cough.  She voiced understanding and agreement with all instructions.  - chlorpheniramine-HYDROcodone (TUSSIONEX) 10-8 MG/5ML; Take 5 mLs by mouth at bedtime as needed for cough.  Dispense: 115 mL; Refill: 0  3. Chronic allergic rhinitis Take all allergy medications as previously prescribed   4. Essential hypertension Generally stable. Patient just finished prednisone taper and this may contribute to elevated blood pressure.   5. Intractable migraine with aura without status migrainosus Continue regular visits with neurology as scheduled.      Problem List Items Addressed This Visit       Cardiovascular and Mediastinum   Essential hypertension   Intractable migraine with aura without status migrainosus     Respiratory   Acute non-recurrent pansinusitis - Primary   Relevant Medications   azithromycin (ZITHROMAX) 250 MG tablet   chlorpheniramine-HYDROcodone (TUSSIONEX) 10-8 MG/5ML   Chronic allergic rhinitis     Other   Cough   Relevant Medications   chlorpheniramine-HYDROcodone (TUSSIONEX) 10-8 MG/5ML     Return in about 2 months (around 09/28/2022) for blood pressure.         Ronnell Freshwater, NP  [facility] [phone] (phone) [phone] (fax)  [place]

## 2022-07-29 ENCOUNTER — Telehealth (INDEPENDENT_AMBULATORY_CARE_PROVIDER_SITE_OTHER): Payer: BC Managed Care – PPO | Admitting: Nurse Practitioner

## 2022-07-29 ENCOUNTER — Encounter: Payer: Self-pay | Admitting: Nurse Practitioner

## 2022-07-29 VITALS — BP 161/83 | HR 70 | Ht 70.8 in | Wt 234.4 lb

## 2022-07-29 DIAGNOSIS — J0140 Acute pansinusitis, unspecified: Secondary | ICD-10-CM | POA: Diagnosis not present

## 2022-07-29 DIAGNOSIS — I10 Essential (primary) hypertension: Secondary | ICD-10-CM | POA: Diagnosis not present

## 2022-07-29 DIAGNOSIS — G43119 Migraine with aura, intractable, without status migrainosus: Secondary | ICD-10-CM

## 2022-07-29 DIAGNOSIS — J309 Allergic rhinitis, unspecified: Secondary | ICD-10-CM

## 2022-07-29 DIAGNOSIS — R051 Acute cough: Secondary | ICD-10-CM

## 2022-07-29 MED ORDER — HYDROCOD POLI-CHLORPHE POLI ER 10-8 MG/5ML PO SUER: 5.0000 mL | Freq: Every evening | ORAL | 0 refills | Status: DC | PRN

## 2022-07-29 MED ORDER — AZITHROMYCIN 250 MG PO TABS: ORAL_TABLET | ORAL | 0 refills | Status: DC

## 2022-08-07 ENCOUNTER — Encounter: Payer: Self-pay | Admitting: Nurse Practitioner

## 2022-09-08 ENCOUNTER — Other Ambulatory Visit: Payer: Self-pay | Admitting: Family

## 2022-09-08 MED ORDER — PREDNISONE 20 MG PO TABS: 40.0000 mg | ORAL_TABLET | Freq: Every day | ORAL | 0 refills | Status: AC

## 2022-09-26 DIAGNOSIS — M25473 Effusion, unspecified ankle: Secondary | ICD-10-CM | POA: Insufficient documentation

## 2022-10-01 ENCOUNTER — Telehealth: Payer: Self-pay | Admitting: *Deleted

## 2022-10-01 ENCOUNTER — Ambulatory Visit: Payer: BC Managed Care – PPO | Admitting: Nurse Practitioner

## 2022-10-01 NOTE — Telephone Encounter (Signed)
LVM for patient to call office.  She left a message stating she needed to cancel her appointment due to child being sick and would like to reschedule.  If she calls back please assist in getting this cancelled and rescheduled.Sharone Picchi Zimmerman Rumple, CMA

## 2022-10-07 ENCOUNTER — Encounter: Payer: Self-pay | Admitting: Nurse Practitioner

## 2022-10-08 ENCOUNTER — Ambulatory Visit (INDEPENDENT_AMBULATORY_CARE_PROVIDER_SITE_OTHER): Payer: BC Managed Care – PPO | Admitting: Nurse Practitioner

## 2022-10-08 ENCOUNTER — Encounter: Payer: Self-pay | Admitting: Nurse Practitioner

## 2022-10-08 VITALS — BP 129/84 | HR 92 | Temp 100.6°F | Ht 70.8 in | Wt 223.1 lb

## 2022-10-08 DIAGNOSIS — J101 Influenza due to other identified influenza virus with other respiratory manifestations: Secondary | ICD-10-CM | POA: Insufficient documentation

## 2022-10-08 DIAGNOSIS — J189 Pneumonia, unspecified organism: Secondary | ICD-10-CM | POA: Insufficient documentation

## 2022-10-08 DIAGNOSIS — R6889 Other general symptoms and signs: Secondary | ICD-10-CM

## 2022-10-08 DIAGNOSIS — R051 Acute cough: Secondary | ICD-10-CM

## 2022-10-08 LAB — POCT INFLUENZA A/B
Influenza A, POC: NEGATIVE
Influenza B, POC: POSITIVE — AB

## 2022-10-08 MED ORDER — OSELTAMIVIR PHOSPHATE 75 MG PO CAPS: 75.0000 mg | ORAL_CAPSULE | Freq: Two times a day (BID) | ORAL | 0 refills | Status: DC

## 2022-10-08 MED ORDER — PREDNISONE 10 MG (48) PO TBPK: ORAL_TABLET | ORAL | 0 refills | Status: DC

## 2022-10-08 MED ORDER — CEFUROXIME AXETIL 500 MG PO TABS: 500.0000 mg | ORAL_TABLET | Freq: Two times a day (BID) | ORAL | 0 refills | Status: DC

## 2022-10-08 NOTE — Progress Notes (Signed)
Established patient visit   Patient: Linda Vargas   DOB: [birth year]   46 y.o. Female  MRN: [id number] Visit Date: 10/08/2022  Chief Complaint  Patient presents with   Cough   Subjective    Was treated back in November  -flu -bronchitis  Did get better, but states that she really hasn't felt that great since.  New symptoms started Christmas day.  -cough -congestion  -fever  -nausea  -diarrhea  -symptoms are gradually getting worse   URI  This is a recurrent problem. The current episode started in the past 7 days. The problem has been waxing and waning. The maximum temperature recorded prior to her arrival was 100.4 - 100.9 F. The fever has been present for 1 to 2 days. Associated symptoms include chest pain, congestion, coughing, diarrhea, headaches, joint pain, nausea, neck pain, rhinorrhea, a sore throat, swollen glands and wheezing. Pertinent negatives include no abdominal pain, dysuria, rash, sinus pain, sneezing or vomiting. She has tried acetaminophen, decongestant, steam and sleep for the symptoms. The treatment provided no relief.     Medications: Outpatient Medications Prior to Visit  Medication Sig   acetaminophen (TYLENOL) 500 MG tablet Take 1,000 mg by mouth every 8 (eight) hours as needed for headache.    albuterol (VENTOLIN HFA) 108 (90 Base) MCG/ACT inhaler Inhale 2 puffs into the lungs every 6 (six) hours as needed for wheezing or shortness of breath.   amLODipine (NORVASC) 5 MG tablet Take 1 tablet (5 mg total) by mouth daily. Started per neurology to help with BP and migraine headaches   atenolol (TENORMIN) 50 MG tablet Take 1.5 tablets (75 mg total) by mouth daily.   Azelastine HCl 0.15 % SOLN INL 2 PFS IEN QD   butalbital-acetaminophen-caffeine (FIORICET) 50-325-40 MG tablet butalbital-acetaminophen-caffeine 50 mg-325 mg-40 mg tablet  TAKE 1 TABLET BY MOUTH EVERY 4 HOURS FOR UP TO 10 DAYS AS NEEDED FOR PAIN   chlorpheniramine-HYDROcodone (TUSSIONEX) 10-8  MG/5ML Take 5 mLs by mouth at bedtime as needed for cough.   clobetasol cream (TEMOVATE) 2.83 % Apply 1 application topically 2 (two) times daily.   DULoxetine (CYMBALTA) 30 MG capsule Take 1 capsule (30 mg total) by mouth 2 (two) times daily.   EMGALITY 120 MG/ML SOAJ Inject 1 mL into the skin every 30 (thirty) days.   EPIPEN 2-PAK 0.3 MG/0.3ML SOAJ injection INJECT INTRAMUSCULARLY AS DIRECTED FOR ANAPHYLATIC REACTIONS   ergocalciferol (DRISDOL) 1.25 MG (50000 UT) capsule Take 1 capsule (50,000 Units total) by mouth once a week.   fluticasone (FLONASE) 50 MCG/ACT nasal spray Place 2 sprays into both nostrils daily.   hydroxychloroquine (PLAQUENIL) 200 MG tablet Take 200 mg by mouth 2 (two) times daily.   hydroxychloroquine (PLAQUENIL) 200 MG tablet Take by mouth.   hydrOXYzine (ATARAX) 25 MG tablet TAKE 1 TABLET(25 MG) BY MOUTH EVERY 8 HOURS AS NEEDED FOR ANXIETY   losartan (COZAAR) 50 MG tablet TAKE 1 TABLET(50 MG) BY MOUTH DAILY   [DISCONTINUED] oseltamivir (TAMIFLU) 75 MG capsule Take 1 capsule (75 mg total) by mouth 2 (two) times daily.   [DISCONTINUED] azithromycin (ZITHROMAX) 250 MG tablet Take 500 mg PO day one, then take 250 mg PO days two through ten.   [DISCONTINUED] ondansetron (ZOFRAN ODT) 8 MG disintegrating tablet Take 1 tablet (8 mg total) by mouth every 8 (eight) hours as needed for nausea or vomiting.   [DISCONTINUED] rizatriptan (MAXALT) 10 MG tablet Take 1 tablet (10 mg total) by mouth as needed for migraine. May repeat  in 2 hours if needed   No facility-administered medications prior to visit.    Review of Systems  Constitutional:  Positive for activity change, appetite change, chills, fatigue and fever.  HENT:  Positive for congestion, postnasal drip, rhinorrhea and sore throat. Negative for sinus pressure, sinus pain and sneezing.   Eyes: Negative.   Respiratory:  Positive for cough and wheezing. Negative for chest tightness and shortness of breath.   Cardiovascular:   Positive for chest pain. Negative for palpitations.  Gastrointestinal:  Positive for diarrhea and nausea. Negative for abdominal pain, constipation and vomiting.  Endocrine: Negative for cold intolerance, heat intolerance, polydipsia and polyuria.  Genitourinary:  Negative for dyspareunia, dysuria, flank pain, frequency and urgency.  Musculoskeletal:  Positive for arthralgias, joint pain, joint swelling, myalgias and neck pain. Negative for back pain.  Skin:  Negative for rash.  Allergic/Immunologic: Negative for environmental allergies.  Neurological:  Positive for headaches. Negative for dizziness and weakness.  Hematological:  Positive for adenopathy.  Psychiatric/Behavioral:  The patient is not nervous/anxious.      Objective     Today's Vitals   10/08/22 1320  BP: 129/84  Pulse: 92  Temp: (Abnormal) 100.6 F (38.1 C)  SpO2: 100%  Weight: 223 lb 1.9 oz (101.2 kg)  Height: 5' 10.8" (1.798 m)   Body mass index is 31.29 kg/m.   Physical Exam Vitals and nursing note reviewed.  Constitutional:      Appearance: Normal appearance. She is well-developed. She is ill-appearing.     Comments: The patient feels warm and clammy to the touch   HENT:     Head: Normocephalic and atraumatic.     Nose: Congestion present.     Mouth/Throat:     Pharynx: Oropharyngeal exudate present.  Eyes:     Extraocular Movements: Extraocular movements intact.     Conjunctiva/sclera: Conjunctivae normal.     Pupils: Pupils are equal, round, and reactive to light.  Cardiovascular:     Rate and Rhythm: Normal rate and regular rhythm.     Pulses: Normal pulses.     Heart sounds: Normal heart sounds.  Pulmonary:     Effort: Pulmonary effort is normal.     Breath sounds: Rhonchi present.     Comments: Harsh, congested, non productive cough  Abdominal:     Palpations: Abdomen is soft.  Musculoskeletal:        General: Normal range of motion.     Cervical back: Normal range of motion and neck  supple.  Lymphadenopathy:     Cervical: Cervical adenopathy present.  Skin:    General: Skin is warm and dry.     Capillary Refill: Capillary refill takes less than 2 seconds.  Neurological:     General: No focal deficit present.     Mental Status: She is alert and oriented to person, place, and time.  Psychiatric:        Mood and Affect: Mood normal.        Behavior: Behavior normal.        Thought Content: Thought content normal.        Judgment: Judgment normal.      Results for orders placed or performed in visit on 10/08/22  POCT Influenza A/B  Result Value Ref Range   Influenza A, POC Negative Negative   Influenza B, POC Positive (A) Negative    Assessment & Plan     1. Influenza B Positive for flu B today. Treat with tamiflu 75 mg twice  daily for 5 days. Rest and increase fluids. Continue using OTC medication to control symptoms.   - oseltamivir (TAMIFLU) 75 MG capsule; Take 1 capsule (75 mg total) by mouth 2 (two) times daily.  Dispense: 10 capsule; Refill: 0  2. Walking pneumonia Patient with wheezing and rhonchi in right lower lobe of the lungs. Harsh cogh. Treat with ceftin 500 mg twice daily for 10 days. Add prednisone taper. Take as directed for 12 days. Rest and increase fluids. Continue using OTC medication to control symptoms.   - cefUROXime (CEFTIN) 500 MG tablet; Take 1 tablet (500 mg total) by mouth 2 (two) times daily with a meal.  Dispense: 20 tablet; Refill: 0 - predniSONE (STERAPRED UNI-PAK 48 TAB) 10 MG (48) TBPK tablet; 12 day taper - take by mouth as directed for 12 days  Dispense: 48 tablet; Refill: 0  3. Acute cough Recommend she use previously prescribed tussionex cough suppressant. She may also take mucinex or Mucinex DM during the day to help with congestion and cough.   4. Flu-like symptoms Flu test positive for influenza B  - POCT Influenza A/B - oseltamivir (TAMIFLU) 75 MG capsule; Take 1 capsule (75 mg total) by mouth 2 (two) times daily.   Dispense: 10 capsule; Refill: 0   Return for prn worsening or persistent symptoms.        Ronnell Freshwater, NP  [facility] [phone] (phone) [phone] (fax)  [place]

## 2022-10-17 ENCOUNTER — Encounter: Payer: Self-pay | Admitting: Nurse Practitioner

## 2022-10-23 ENCOUNTER — Ambulatory Visit: Payer: BC Managed Care – PPO | Admitting: Podiatry

## 2022-10-23 ENCOUNTER — Ambulatory Visit (INDEPENDENT_AMBULATORY_CARE_PROVIDER_SITE_OTHER): Payer: BC Managed Care – PPO

## 2022-10-23 ENCOUNTER — Encounter: Payer: Self-pay | Admitting: Podiatry

## 2022-10-23 ENCOUNTER — Other Ambulatory Visit: Payer: Self-pay | Admitting: Podiatry

## 2022-10-23 VITALS — BP 170/100 | HR 89

## 2022-10-23 DIAGNOSIS — M778 Other enthesopathies, not elsewhere classified: Secondary | ICD-10-CM

## 2022-10-23 DIAGNOSIS — D2372 Other benign neoplasm of skin of left lower limb, including hip: Secondary | ICD-10-CM | POA: Diagnosis not present

## 2022-10-23 DIAGNOSIS — M7752 Other enthesopathy of left foot: Secondary | ICD-10-CM | POA: Diagnosis not present

## 2022-10-23 MED ORDER — TRIAMCINOLONE ACETONIDE 40 MG/ML IJ SUSP
20.0000 mg | Freq: Once | INTRAMUSCULAR | Status: AC
  Administered 2022-10-23: 20 mg

## 2022-10-23 MED ORDER — DEXAMETHASONE SODIUM PHOSPHATE 120 MG/30ML IJ SOLN
2.0000 mg | Freq: Once | INTRAMUSCULAR | Status: AC
  Administered 2022-10-23: 2 mg via INTRA_ARTICULAR

## 2022-10-23 NOTE — Progress Notes (Signed)
Subjective:  Patient ID: Linda Vargas, female    DOB: [birth year],  MRN: [id number] HPI Chief Complaint  Patient presents with   Foot Pain    Sub 5th MPJ left - callused area, extremely tender with walking, burning  1st MPJ left - bunion deformity x years, corrected right years ago, starting to get more achy and uncomfortable in shoes   New Patient (Initial Visit)    Est pt 2019    47 y.o. female presents with the above complaint.   ROS: Denies fever chills nausea vomit muscle aches pains calf pain back pain chest pain shortness of breath.  States that she has been sick with viral and bacterial infection since school started in August.  Has recently developed lupus flares which has not been able to calm down.  She is currently seeing the rheumatology department at [facility] for this.  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   Fibromyalgia    Hypertension    Lupus ([place])    Migraine    Seasonal allergic rhinitis    Past Surgical History:  Procedure Laterality Date   CHOLECYSTECTOMY  2012   DILATION AND CURETTAGE OF UTERUS  2011   DILATION AND CURETTAGE OF UTERUS  01/17/2012   Procedure: DILATATION AND CURETTAGE;  Surgeon: Marylynn Pearson, MD;  Location: [facility];  Service: Gynecology;  Laterality: N/A;  Dilatation and curratage, insertion of Bakri Balloon   FOOT SURGERY     HARDWARE REMOVAL Right 02/18/2018   Procedure: HARDWARE REMOVAL RIGHT WRIST;  Surgeon: Hessie Knows, MD;  Location: [facility];  Service: Orthopedics;  Laterality: Right;   OPEN REDUCTION INTERNAL FIXATION (ORIF) DISTAL RADIAL FRACTURE Right 11/26/2017   Procedure: OPEN REDUCTION INTERNAL FIXATION (ORIF) DISTAL RADIAL FRACTURE;  Surgeon: Hessie Knows, MD;  Location: [facility];  Service: Orthopedics;  Laterality: Right;   TONSILLECTOMY     WRIST SURGERY Right 02/18/2018    Current Outpatient Medications:    ondansetron (ZOFRAN-ODT) 8 MG  disintegrating tablet, Take by mouth., Disp: , Rfl:    acetaminophen (TYLENOL) 500 MG tablet, Take 1,000 mg by mouth every 8 (eight) hours as needed for headache. , Disp: , Rfl:    albuterol (VENTOLIN HFA) 108 (90 Base) MCG/ACT inhaler, Inhale 2 puffs into the lungs every 6 (six) hours as needed for wheezing or shortness of breath., Disp: 1 each, Rfl: 2   amLODipine (NORVASC) 5 MG tablet, Take 1 tablet (5 mg total) by mouth daily. Started per neurology to help with BP and migraine headaches, Disp: 30 tablet, Rfl: 3   atenolol (TENORMIN) 50 MG tablet, Take 1.5 tablets (75 mg total) by mouth daily., Disp: 45 tablet, Rfl: 2   Azelastine HCl 0.15 % SOLN, INL 2 PFS IEN QD, Disp: , Rfl: 12   butalbital-acetaminophen-caffeine (FIORICET) 50-325-40 MG tablet, butalbital-acetaminophen-caffeine 50 mg-325 mg-40 mg tablet  TAKE 1 TABLET BY MOUTH EVERY 4 HOURS FOR UP TO 10 DAYS AS NEEDED FOR PAIN, Disp: , Rfl:    cefUROXime (CEFTIN) 500 MG tablet, Take 1 tablet (500 mg total) by mouth 2 (two) times daily with a meal., Disp: 20 tablet, Rfl: 0   chlorpheniramine-HYDROcodone (TUSSIONEX) 10-8 MG/5ML, Take 5 mLs by mouth at bedtime as needed for cough., Disp: 115 mL, Rfl: 0   clobetasol cream (TEMOVATE) 1.94 %, Apply 1 application topically 2 (two) times daily., Disp: 60 g, Rfl: 1   DULoxetine (CYMBALTA) 30 MG capsule, Take 1  capsule (30 mg total) by mouth 2 (two) times daily., Disp: 60 capsule, Rfl: 3   EMGALITY 120 MG/ML SOAJ, Inject 1 mL into the skin every 30 (thirty) days., Disp: , Rfl:    EPIPEN 2-PAK 0.3 MG/0.3ML SOAJ injection, INJECT INTRAMUSCULARLY AS DIRECTED FOR ANAPHYLATIC REACTIONS, Disp: , Rfl: 0   ergocalciferol (DRISDOL) 1.25 MG (50000 UT) capsule, Take 1 capsule (50,000 Units total) by mouth once a week., Disp: 4 capsule, Rfl: 5   fluticasone (FLONASE) 50 MCG/ACT nasal spray, Place 2 sprays into both nostrils daily., Disp: 16 g, Rfl: 0   hydrochlorothiazide (HYDRODIURIL) 25 MG tablet, , Disp: , Rfl:     hydroxychloroquine (PLAQUENIL) 200 MG tablet, Take 200 mg by mouth 2 (two) times daily., Disp: , Rfl:    hydroxychloroquine (PLAQUENIL) 200 MG tablet, Take by mouth., Disp: , Rfl:    hydrOXYzine (ATARAX) 25 MG tablet, TAKE 1 TABLET(25 MG) BY MOUTH EVERY 8 HOURS AS NEEDED FOR ANXIETY, Disp: 90 tablet, Rfl: 1   losartan (COZAAR) 50 MG tablet, TAKE 1 TABLET(50 MG) BY MOUTH DAILY, Disp: 90 tablet, Rfl: 1   oseltamivir (TAMIFLU) 75 MG capsule, Take 1 capsule (75 mg total) by mouth 2 (two) times daily., Disp: 10 capsule, Rfl: 0   predniSONE (STERAPRED UNI-PAK 48 TAB) 10 MG (48) TBPK tablet, 12 day taper - take by mouth as directed for 12 days, Disp: 48 tablet, Rfl: 0  Allergies  Allergen Reactions   Ibuprofen Swelling    Lips swell   Bean Pod Extract Hives   Corn-Containing Products Hives   Peanut-Containing Drug Products Hives   Latex Other (See Comments)   Review of Systems Objective:   Vitals:   10/23/22 1005  BP: (!) 170/100  Pulse: 89    General: Well developed, nourished, in no acute distress, alert and oriented x3   Dermatological: Skin is warm, dry and supple bilateral. Nails x 10 are well maintained; remaining integument appears unremarkable at this time. There are no open sores, no preulcerative lesions, no rash or signs of infection present.  Benign skin lesion beneath the fifth metatarsal head of the left foot appears to be a poor keratomas and does not appear to be verrucoid in nature.  Vascular: Dorsalis Pedis artery and Posterior Tibial artery pedal pulses are 2/4 bilateral with immedate capillary fill time. Pedal hair growth present. No varicosities and no lower extremity edema present bilateral.   Neruologic: Grossly intact via light touch bilateral. Vibratory intact via tuning fork bilateral. Protective threshold with Semmes Wienstein monofilament intact to all pedal sites bilateral. Patellar and Achilles deep tendon reflexes 2+ bilateral. No Babinski or clonus noted  bilateral.   Musculoskeletal: No gross boney pedal deformities bilateral. No pain, crepitus, or limitation noted with foot and ankle range of motion bilateral. Muscular strength 5/5 in all groups tested bilateral.  Great range of motion of the first metatarsal phalangeal joint of the left foot.  No crepitation.  She does have tenderness and pain on palpation to the dorsal medial aspect of the first metatarsal phalangeal joint.  She has a palpable bursa beneath the fifth metatarsal head of the left foot with mild overlying erythema no open lesions or wounds.  Gait: Unassisted, Nonantalgic.    Radiographs:  Radiographs taken today demonstrate a rectus foot good mineralization no acute findings.  Mild increase in the first and metatarsal angle with hallux abductus angle increased.  She has a very small dorsal spur first metatarsophalangeal joint.  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    Plan: Discussed etiology pathology conservative surgical therapies I injected around the fifth metatarsal with dexamethasone local anesthetic to help with the bursitis.  We sharply debrided the painful lesions beneath the fifth metatarsal head.  I also injected the capsulitis neuritis of the first metatarsophalangeal joint left foot with 2 mg of dexamethasone and local anesthetic.  I will follow-up with her on an as-needed basis highly recommend that she get her lupus under control prior to any type of foot surgery.     Siarra Gilkerson T. [place], [place]

## 2022-10-24 ENCOUNTER — Encounter: Payer: Self-pay | Admitting: Nurse Practitioner

## 2022-10-27 ENCOUNTER — Ambulatory Visit: Payer: BC Managed Care – PPO | Admitting: Internal Medicine

## 2022-10-27 NOTE — Progress Notes (Deleted)
Patient ID: Linda Vargas, female   DOB: 1976-01-03, 47 y.o.   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  Last visit 1 year ago.  Interim history: Since last visit, she lost 25 pounds! She feels well, without new headaches or visual changes. She was sick with bacterial and viral URI since 12/2021.  Also, she had capsulitis of the left foot and saw Dr. Milinda Pointer with podiatry.  She also had SLE flares and had prednisone frequently.  Reviewed and addended history: Pt. has been dx with a pituitary adenoma in 07/2021, during investigation for migraine with aura by [facility] neurology.  On nortriptyline for the headaches.  She had COVID-19 in 04/2021 and she developed daily migraines afterwards.    She has a history of lupus and has been on chronic steroids (tapers, injections) since 2012.  She has had more steroids around the time of her COVID-19 infection. Since having had COVID, she felt more tired and reportedly gained 40 pounds.  She has wide, colorless stretch marks.  No proximal muscle weakness.  She has a history of HTN.  She denies changing in shoe size or ring size, increased sweating, or jaw enlargement.  No breast tension or galactorrhea.  Her menstrual cycles have become heavy, painful, and she has 1-3 a month.  Also, occasional hot flashes along with cold intolerance.  She had ophthalmology evaluation after diagnosis.  She was found to have an abnormal visual field.  Reportedly, no glaucoma.  Brain MRI (without contrast, 07/20/2021): Hyperintense focus in the left pituitary gland: possible pituitary microadenoma versus developmental cyst.  It was commented that this was new since her MRI of 2017. Apparent 2 mm round T2 hyperintense focus within the left aspect of the pituitary gland     I reviewed previous pertinent  investigation: Component     Latest Ref Rng & Units 10/30/2021  IGF-I, LC/MS     52 - 328 ng/mL 173  Z-Score (Female)     -2.0 - 2.0 SD 0.4  C206 ACTH     6 - 50 pg/mL 20  Cortisol, Plasma     ug/dL 11.3  Growth Hormone     < OR = 7.1 ng/mL <0.1   Component     Latest Ref Rng & Units 08/28/2021  TSH     0.450 - 4.500 uIU/mL 2.970  T4,Free(Direct)     0.82 - 1.77 ng/dL 1.25  LH     mIU/mL 11.8  FSH     mIU/mL 14.3  Estradiol     pg/mL 43.7  Prolactin     4.8 - 23.3 ng/mL 12.1    Pt. also has a history of HTN, GERD, depression, fibromyalgia, and SLE - sees rheumatology -repeated courses of prednisone (tapers, injection).  No FH of pituitary tumors, hypercalcemia, pancreatic or adrenal tumors.   She has family history of Diabetes, HTN, HL, heart disease, thyroid disease.  No exposure to Radiation. No h/o cancer. She had VF abnormalities; also has blurry vision after her Covid19 episode.  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   Fibromyalgia    Hypertension    Lupus ([place])    Migraine    Seasonal allergic rhinitis    Past Surgical History:  Procedure Laterality  Date   CHOLECYSTECTOMY  2012   DILATION AND CURETTAGE OF UTERUS  2011   DILATION AND CURETTAGE OF UTERUS  01/17/2012   Procedure: DILATATION AND CURETTAGE;  Surgeon: Marylynn Pearson, MD;  Location: [facility];  Service: Gynecology;  Laterality: N/A;  Dilatation and curratage, insertion of Bakri Balloon   FOOT SURGERY     HARDWARE REMOVAL Right 02/18/2018   Procedure: HARDWARE REMOVAL RIGHT WRIST;  Surgeon: Hessie Knows, MD;  Location: [facility];  Service: Orthopedics;  Laterality: Right;   OPEN REDUCTION INTERNAL FIXATION (ORIF) DISTAL RADIAL FRACTURE Right 11/26/2017   Procedure: OPEN REDUCTION INTERNAL FIXATION (ORIF) DISTAL RADIAL FRACTURE;  Surgeon: Hessie Knows, MD;  Location: [facility];  Service: Orthopedics;  Laterality:  Right;   TONSILLECTOMY     WRIST SURGERY Right 02/18/2018   Social History   Socioeconomic History   Marital status: Single    Spouse name: Not on file   Number of children: Not on file   Years of education: Not on file   Highest education level: Not on file  Occupational History   Not on file  Tobacco Use   Smoking status: Never   Smokeless tobacco: Never  Vaping Use   Vaping Use: Never used  Substance and Sexual Activity   Alcohol use: No   Drug use: No   Sexual activity: Yes    Birth control/protection: I.U.D.  Other Topics Concern   Not on file  Social History Narrative   Lives at home.   Social Determinants of Health   Financial Resource Strain: Not on file  Food Insecurity: Not on file  Transportation Needs: Not on file  Physical Activity: Not on file  Stress: Not on file  Social Connections: Not on file  Intimate Partner Violence: Not on file   Current Outpatient Medications on File Prior to Visit  Medication Sig Dispense Refill   acetaminophen (TYLENOL) 500 MG tablet Take 1,000 mg by mouth every 8 (eight) hours as needed for headache.      albuterol (VENTOLIN HFA) 108 (90 Base) MCG/ACT inhaler Inhale 2 puffs into the lungs every 6 (six) hours as needed for wheezing or shortness of breath. 1 each 2   amLODipine (NORVASC) 5 MG tablet Take 1 tablet (5 mg total) by mouth daily. Started per neurology to help with BP and migraine headaches 30 tablet 3   atenolol (TENORMIN) 50 MG tablet Take 1.5 tablets (75 mg total) by mouth daily. 45 tablet 2   Azelastine HCl 0.15 % SOLN INL 2 PFS IEN QD  12   butalbital-acetaminophen-caffeine (FIORICET) 50-325-40 MG tablet butalbital-acetaminophen-caffeine 50 mg-325 mg-40 mg tablet  TAKE 1 TABLET BY MOUTH EVERY 4 HOURS FOR UP TO 10 DAYS AS NEEDED FOR PAIN     cefUROXime (CEFTIN) 500 MG tablet Take 1 tablet (500 mg total) by mouth 2 (two) times daily with a meal. 20 tablet 0   chlorpheniramine-HYDROcodone (TUSSIONEX) 10-8 MG/5ML  Take 5 mLs by mouth at bedtime as needed for cough. 115 mL 0   clobetasol cream (TEMOVATE) AB-123456789 % Apply 1 application topically 2 (two) times daily. 60 g 1   DULoxetine (CYMBALTA) 30 MG capsule Take 1 capsule (30 mg total) by mouth 2 (two) times daily. 60 capsule 3   EMGALITY 120 MG/ML SOAJ Inject 1 mL into the skin every 30 (thirty) days.     EPIPEN 2-PAK 0.3 MG/0.3ML SOAJ injection INJECT INTRAMUSCULARLY AS DIRECTED FOR ANAPHYLATIC REACTIONS  0   ergocalciferol (DRISDOL) 1.25 MG (50000 UT) capsule  Take 1 capsule (50,000 Units total) by mouth once a week. 4 capsule 5   fluticasone (FLONASE) 50 MCG/ACT nasal spray Place 2 sprays into both nostrils daily. 16 g 0   hydrochlorothiazide (HYDRODIURIL) 25 MG tablet      hydroxychloroquine (PLAQUENIL) 200 MG tablet Take 200 mg by mouth 2 (two) times daily.     hydroxychloroquine (PLAQUENIL) 200 MG tablet Take by mouth.     hydrOXYzine (ATARAX) 25 MG tablet TAKE 1 TABLET(25 MG) BY MOUTH EVERY 8 HOURS AS NEEDED FOR ANXIETY 90 tablet 1   losartan (COZAAR) 50 MG tablet TAKE 1 TABLET(50 MG) BY MOUTH DAILY 90 tablet 1   ondansetron (ZOFRAN-ODT) 8 MG disintegrating tablet Take by mouth.     oseltamivir (TAMIFLU) 75 MG capsule Take 1 capsule (75 mg total) by mouth 2 (two) times daily. 10 capsule 0   predniSONE (STERAPRED UNI-PAK 48 TAB) 10 MG (48) TBPK tablet 12 day taper - take by mouth as directed for 12 days 48 tablet 0   No current facility-administered medications on file prior to visit.   Allergies  Allergen Reactions   Ibuprofen Swelling    Lips swell   Bean Pod Extract Hives   Corn-Containing Products Hives   Peanut-Containing Drug Products Hives   Latex Other (See Comments)   Family History  Problem Relation Age of Onset   Hypertension Mother    Hypertension Father    Diabetes Father    Heart disease Maternal Grandmother    Heart disease Paternal Grandmother    Anesthesia problems Neg Hx    PE: LMP 10/04/2022  Wt Readings from Last 3  Encounters:  10/08/22 223 lb 1.9 oz (101.2 kg)  07/29/22 234 lb 6.4 oz (106.3 kg)  06/19/22 229 lb 6.4 oz (104.1 kg)   Constitutional: overweight, in NAD Eyes:  EOMI, no exophthalmos ENT: no neck masses, no cervical lymphadenopathy Cardiovascular: RRR, No MRG Respiratory: CTA B Musculoskeletal: no deformities Skin:no rashes Neurological: no tremor with outstretched hands  ASSESSMENT: 1. Pituitary incidentaloma  PLAN:  1. Patient with pituitary mass incidentally discovered during investigation for migraines.  The mass is very small, 2 mm in size, in the left side of her pituitary.  We discussed that this is not a brain tumor - these tumors are extremely rarely malignant, with the vast majority of them being benign and well-behaved. -We discussed that a pituitary mass could be Cystic Nodular-in her case, possibly with cystic degeneration since this had a high T2 signal - Not producing hormones, not compressing the pituitary gland or the optic chiasm - Not producing hormones, but compressing adjacent structures -including the pituitary gland (causing hypopituitarism) or the optic chiasm (causing visual field cuts).  She did have an abnormal visual field, however, we reviewed together the images and I pointed out that her pituitary tumor is very small, does not appear to be extending outside the sella turcica, and is situated quite far from the optic chiasm to cause a mass effect on the optic chiasm - Producing hormones: - Prolactin (prolactinoma) -prolactin level was normal, she has no breastmilk discharge - ACTH (Cushing's disease) -she reported 40 pound weight gain at last visit, however, reviewing her chart from 04/01/2021 to 11/01/2021, her weight increased by 6 pounds by medical scale.  She does not have portable stretch marks, but her stretch marks are white, possibly in the setting of steroid treatment.  Also, she has central distribution of fat.  She has a history of hypertension.   ACTH  and cortisol levels were normal, though. - growth hormone (acromegaly) - no enlarged jaw, changing size of rings or changing shoe sizes.  Also, GH and IGF-I were normal - LH or FSH (gonadotropin secreting tumor) -menstrual cycles are her monthly, but she occasionally has more bleeding in 1 month.  LH, FSH, estradiol were normal. - TSH (TSH secreting tumor) (very rare) -TFTs were normal -We checked her pituitary hormones at last visit and they were all normal and we will repeat these today.  However, going further, I do not feel that we absolutely need to repeat them if she is asymptomatic. -After checking the labs: if the above labs are normal, we can repeat a pituitary MRI in a year to check for growth. If there is no growth, we can manage the pituitary tumor expectantly, without surgical intervention. If the above labs are abnormal, depending on the hormone that it is producing, we may treat this medically (cabergoline/bromocriptine for prolactinoma) or surgically for the other tumors. -No need for pituitary surgery for such a small pituitary tumor without hormone production -Plan to obtain another pituitary MRI now -if stable, no further investigation is needed -I will see her back in 5 years  Philemon Kingdom, MD PhD [facility] Endocrinology

## 2022-10-28 NOTE — Telephone Encounter (Signed)
Called pt she is advised that her paperwork has been faxed

## 2022-11-01 ENCOUNTER — Encounter: Payer: Self-pay | Admitting: Nurse Practitioner

## 2022-11-11 ENCOUNTER — Encounter: Payer: Self-pay | Admitting: Nurse Practitioner

## 2022-11-11 ENCOUNTER — Ambulatory Visit: Payer: BC Managed Care – PPO | Admitting: Nurse Practitioner

## 2022-11-12 ENCOUNTER — Encounter: Payer: Self-pay | Admitting: Nurse Practitioner

## 2022-11-12 ENCOUNTER — Ambulatory Visit: Payer: BC Managed Care – PPO | Admitting: Nurse Practitioner

## 2022-11-12 VITALS — BP 155/90 | HR 80 | Ht 70.8 in | Wt 223.0 lb

## 2022-11-12 DIAGNOSIS — M3219 Other organ or system involvement in systemic lupus erythematosus: Secondary | ICD-10-CM

## 2022-11-12 DIAGNOSIS — I10 Essential (primary) hypertension: Secondary | ICD-10-CM

## 2022-11-12 DIAGNOSIS — J189 Pneumonia, unspecified organism: Secondary | ICD-10-CM | POA: Diagnosis not present

## 2022-11-12 DIAGNOSIS — G43119 Migraine with aura, intractable, without status migrainosus: Secondary | ICD-10-CM | POA: Diagnosis not present

## 2022-11-12 DIAGNOSIS — F321 Major depressive disorder, single episode, moderate: Secondary | ICD-10-CM

## 2022-11-12 NOTE — Progress Notes (Signed)
Established patient visit   Patient: Linda Vargas   DOB: [birth year]   47 y.o. Female  MRN: [id number] Visit Date: 11/12/2022   Chief Complaint  Patient presents with   Hypertension   Subjective    HPI  Follow up  -blood pressure --had episode last month when blood pressure spiked after being well managed  -she was in pain and sick at the time -blood pressure still moderately elevated today -she does have headache   She has been out of work since before Christmas  -supposed to go back to work 11/18/2022.  -in my opinion, she does need additional time off.  -I will extend her time off through 11/21/2022. She should be able to return to work 11/24/2022 without restrictions.    Medications: Outpatient Medications Prior to Visit  Medication Sig   acetaminophen (TYLENOL) 500 MG tablet Take 1,000 mg by mouth every 8 (eight) hours as needed for headache.    albuterol (VENTOLIN HFA) 108 (90 Base) MCG/ACT inhaler Inhale 2 puffs into the lungs every 6 (six) hours as needed for wheezing or shortness of breath.   amLODipine (NORVASC) 5 MG tablet Take 1 tablet (5 mg total) by mouth daily. Started per neurology to help with BP and migraine headaches   atenolol (TENORMIN) 50 MG tablet Take 1.5 tablets (75 mg total) by mouth daily.   Azelastine HCl 0.15 % SOLN INL 2 PFS IEN QD   butalbital-acetaminophen-caffeine (FIORICET) 50-325-40 MG tablet butalbital-acetaminophen-caffeine 50 mg-325 mg-40 mg tablet  TAKE 1 TABLET BY MOUTH EVERY 4 HOURS FOR UP TO 10 DAYS AS NEEDED FOR PAIN   cefUROXime (CEFTIN) 500 MG tablet Take 1 tablet (500 mg total) by mouth 2 (two) times daily with a meal.   chlorpheniramine-HYDROcodone (TUSSIONEX) 10-8 MG/5ML Take 5 mLs by mouth at bedtime as needed for cough.   clobetasol cream (TEMOVATE) AB-[id number] % Apply 1 application topically 2 (two) times daily.   DULoxetine (CYMBALTA) 30 MG capsule Take 1 capsule (30 mg total) by mouth 2 (two) times daily.   EMGALITY 120 MG/ML SOAJ  Inject 1 mL into the skin every 30 (thirty) days.   EPIPEN 2-PAK 0.3 MG/0.3ML SOAJ injection INJECT INTRAMUSCULARLY AS DIRECTED FOR ANAPHYLATIC REACTIONS   ergocalciferol (DRISDOL) 1.25 MG (50000 UT) capsule Take 1 capsule (50,000 Units total) by mouth once a week.   fluticasone (FLONASE) 50 MCG/ACT nasal spray Place 2 sprays into both nostrils daily.   hydrochlorothiazide (HYDRODIURIL) 25 MG tablet    hydroxychloroquine (PLAQUENIL) 200 MG tablet Take 200 mg by mouth 2 (two) times daily.   hydroxychloroquine (PLAQUENIL) 200 MG tablet Take by mouth.   hydrOXYzine (ATARAX) 25 MG tablet TAKE 1 TABLET(25 MG) BY MOUTH EVERY 8 HOURS AS NEEDED FOR ANXIETY   losartan (COZAAR) 50 MG tablet TAKE 1 TABLET(50 MG) BY MOUTH DAILY   ondansetron (ZOFRAN-ODT) 8 MG disintegrating tablet Take by mouth.   [DISCONTINUED] oseltamivir (TAMIFLU) 75 MG capsule Take 1 capsule (75 mg total) by mouth 2 (two) times daily.   [DISCONTINUED] predniSONE (STERAPRED UNI-PAK 48 TAB) 10 MG (48) TBPK tablet 12 day taper - take by mouth as directed for 12 days   No facility-administered medications prior to visit.    Review of Systems  Constitutional:  Positive for activity change and fatigue. Negative for appetite change, chills and fever.  HENT:  Positive for congestion, postnasal drip, rhinorrhea, sinus pressure and sinus pain. Negative for sneezing and sore throat.   Eyes: Negative.   Respiratory:  Positive for cough.  Negative for chest tightness, shortness of breath and wheezing.   Cardiovascular:  Negative for chest pain and palpitations.       Bleated blood pressure  Gastrointestinal:  Negative for abdominal pain, constipation, diarrhea, nausea and vomiting.  Endocrine: Negative for cold intolerance, heat intolerance, polydipsia and polyuria.  Genitourinary:  Negative for dyspareunia, dysuria, flank pain, frequency and urgency.  Musculoskeletal:  Negative for arthralgias, back pain and myalgias.  Skin:  Negative for  rash.  Allergic/Immunologic: Positive for environmental allergies.  Neurological:  Positive for headaches. Negative for dizziness and weakness.  Hematological:  Negative for adenopathy.  Psychiatric/Behavioral:  Positive for dysphoric mood. The patient is nervous/anxious.        Objective     Today's Vitals   11/12/22 1058 11/12/22 1134  BP: (Abnormal) 164/98 (Abnormal) 155/90  Pulse: 80   SpO2: 99%   Weight: 223 lb (101.2 kg)   Height: 5' 10.8" (1.798 m)    Body mass index is 31.28 kg/m.  BP Readings from Last 3 Encounters:  11/12/22 (Abnormal) 155/90  10/23/22 (Abnormal) 170/100  10/08/22 129/84    Wt Readings from Last 3 Encounters:  11/12/22 223 lb (101.2 kg)  10/08/22 223 lb 1.9 oz (101.2 kg)  07/29/22 234 lb 6.4 oz (106.3 kg)    Physical Exam Vitals and nursing note reviewed.  Constitutional:      Appearance: Normal appearance. She is well-developed. She is ill-appearing.  HENT:     Head: Normocephalic and atraumatic.     Right Ear: Tympanic membrane, ear canal and external ear normal.     Left Ear: Tympanic membrane, ear canal and external ear normal.     Nose: Congestion present.     Mouth/Throat:     Mouth: Mucous membranes are moist.     Pharynx: Oropharynx is clear.  Eyes:     Extraocular Movements: Extraocular movements intact.     Conjunctiva/sclera: Conjunctivae normal.     Pupils: Pupils are equal, round, and reactive to light.  Cardiovascular:     Rate and Rhythm: Normal rate and regular rhythm.     Pulses: Normal pulses.     Heart sounds: Normal heart sounds.  Pulmonary:     Effort: Pulmonary effort is normal.     Breath sounds: Normal breath sounds.  Abdominal:     Palpations: Abdomen is soft.  Musculoskeletal:        General: Normal range of motion.     Cervical back: Normal range of motion and neck supple.  Lymphadenopathy:     Cervical: Cervical adenopathy present.  Skin:    General: Skin is warm and dry.     Capillary Refill:  Capillary refill takes less than 2 seconds.  Neurological:     General: No focal deficit present.     Mental Status: She is alert and oriented to person, place, and time.  Psychiatric:        Mood and Affect: Mood normal.        Behavior: Behavior normal.        Thought Content: Thought content normal.        Judgment: Judgment normal.      Assessment & Plan    1. Walking pneumonia Gradually improving, though not back to baseline. In my opinion, she does need additional time off.  -I will extend her time off through 11/21/2022. She should be able to return to work 11/24/2022 without restrictions.  2. Essential hypertension Blood pressure elevated today, though, historically well controlled.  No changes in blood pressure medication made today.   3. Intractable migraine with aura without status migrainosus Continue regular visits with neurology to manage chronic migraine headaches   4. Systemic lupus erythematosus with other organ involvement, unspecified SLE type ([place]) Continue regular visits with rheumatology/lupus specialist as scheduled.   5. Depression, major, single episode, moderate (HCC) Higher PHQ 2/9 score today. Increased stress due to acute  illness which is becoming more chronic and causing problems with employment.  Currently on [place].  No changes made today. Reassess in 3 months. She understands she can contact the office if symptoms worsen or do not improve.   Problem List Items Addressed This Visit       Cardiovascular and Mediastinum   Essential hypertension   Intractable migraine with aura without status migrainosus     Respiratory   Walking pneumonia - Primary     Other   Systemic lupus erythematosus (HCC)   Depression, major, single episode, moderate (HCC)     Return in about 3 months (around 02/10/2023) for blood pressure, mood.         Ronnell Freshwater, NP  [facility] [phone] (phone) [phone] (fax)  [place]

## 2022-11-12 NOTE — Patient Instructions (Signed)
Take additional dose norvasc 5 mg tablet daily for blood pressure of 140/80.  2. Conitnue other blood pressure medications as prescribed

## 2022-11-14 ENCOUNTER — Other Ambulatory Visit: Payer: Self-pay | Admitting: Obstetrics and Gynecology

## 2022-11-14 DIAGNOSIS — R928 Other abnormal and inconclusive findings on diagnostic imaging of breast: Secondary | ICD-10-CM

## 2022-11-19 LAB — HM PAP SMEAR: HPV, high-risk: NEGATIVE

## 2022-12-06 ENCOUNTER — Other Ambulatory Visit: Payer: Self-pay | Admitting: Nurse Practitioner

## 2022-12-06 DIAGNOSIS — F411 Generalized anxiety disorder: Secondary | ICD-10-CM

## 2022-12-16 ENCOUNTER — Ambulatory Visit: Payer: BC Managed Care – PPO | Admitting: Internal Medicine

## 2022-12-26 ENCOUNTER — Other Ambulatory Visit: Payer: Self-pay

## 2022-12-26 DIAGNOSIS — R112 Nausea with vomiting, unspecified: Secondary | ICD-10-CM

## 2022-12-26 MED ORDER — ONDANSETRON 8 MG PO TBDP: 8.0000 mg | ORAL_TABLET | Freq: Two times a day (BID) | ORAL | 1 refills | Status: DC

## 2022-12-29 ENCOUNTER — Ambulatory Visit: Payer: BC Managed Care – PPO | Admitting: Internal Medicine

## 2023-01-02 ENCOUNTER — Other Ambulatory Visit: Payer: BC Managed Care – PPO

## 2023-01-12 ENCOUNTER — Telehealth: Payer: Self-pay

## 2023-01-12 NOTE — Telephone Encounter (Signed)
Returned patients call to schedule her colonoscopy.  I was unable to reach her because her voicemail is currently full.  Thanks,  [place], [place]

## 2023-01-12 NOTE — Telephone Encounter (Signed)
Patient calling to schedule colonoscopy. Requesting call back.  

## 2023-01-13 ENCOUNTER — Encounter: Payer: Self-pay | Admitting: Internal Medicine

## 2023-01-13 ENCOUNTER — Ambulatory Visit: Payer: BC Managed Care – PPO | Admitting: Internal Medicine

## 2023-01-13 VITALS — BP 132/100 | HR 79 | Ht 70.8 in | Wt 226.4 lb

## 2023-01-13 DIAGNOSIS — D497 Neoplasm of unspecified behavior of endocrine glands and other parts of nervous system: Secondary | ICD-10-CM

## 2023-01-13 LAB — TSH: TSH: 2.12 u[IU]/mL (ref 0.35–5.50)

## 2023-01-13 LAB — T3, FREE: T3, Free: 3.3 pg/mL (ref 2.3–4.2)

## 2023-01-13 LAB — CORTISOL: Cortisol, Plasma: 9.5 ug/dL

## 2023-01-13 LAB — T4, FREE: Free T4: 0.87 ng/dL (ref 0.60–1.60)

## 2023-01-13 NOTE — Patient Instructions (Signed)
Please stop at the lab. ° °Please come back for a follow-up appointment in 2 years. ° °

## 2023-01-13 NOTE — Progress Notes (Signed)
Patient ID: Linda Vargas, female   DOB: [birth year], 47 y.o.   MRN: [id number]  HPI  Linda Vargas is a 47 y.o.-year-old female, initially referred by her PCP, Linda Alcide, NP [facility]), returning for follow-up for pituitary microadenoma.  Last visit 1 year and 3 months ago.  Interim history: She had some dizziness >> she was recently treated for a ear infection. She also has some hearing loss. She has HAs which she attributes to allergies.  These are stable. She had PNA and fluB in 09/2022 >> was on Prednisone >> now off steroids. She lost ~25 lbs since last OV.  Her blood pressure improved lately.  Reviewed hx -at our first visit she described: Pt. has been dx with a pituitary adenoma in 07/2021, during investigation for migraine with aura by [facility] neurology.  She is on nortriptyline for the headaches.  She mentions that she had COVID-19 in 04/2021 and she has had migraines every day since.    She has a history of lupus and has been on chronic steroids (tapers, injections) since 2012.  She has had more steroids around the time of her COVID-19 infection. Since having had COVID, she is more tired and feels she gained 40 pounds.  She has wide, colorless stretch marks.  No proximal muscle weakness.  She has a history of HTN.  She denies changing in shoe size or ring size, increased sweating, or jaw enlargement.  No breast tension or galactorrhea.  Her menstrual cycles have become heavy, painful, and she has 1-3 a month.  Also, occasional hot flashes along with cold intolerance.  She had ophthalmology evaluation in 2023.  She was found to have an abnormal visual field.  Reportedly, no glaucoma.  Brain MRI (without contrast, 07/20/2021): Hyperintense focus in the left pituitary gland: possible pituitary microadenoma versus developmental cyst.  It was commented that this was new since her MRI of 2017. Apparent 2 mm round T2 hyperintense focus within the left aspect of the pituitary  gland     I reviewed previous pertinent investigation: Component     Latest Ref Rng & Units 10/30/2021  IGF-I, LC/MS     52 - 328 ng/mL 173  Z-Score (Female)     -2.0 - 2.0 SD 0.4  C206 ACTH     6 - 50 pg/mL 20  Cortisol, Plasma     ug/dL 11.3  Growth Hormone     < OR = 7.1 ng/mL <0.1   Component     Latest Ref Rng & Units 08/28/2021  TSH     0.450 - 4.500 uIU/mL 2.970  T4,Free(Direct)     0.82 - 1.77 ng/dL 1.25  LH     mIU/mL 11.8  FSH     mIU/mL 14.3  Estradiol     pg/mL 43.7  Prolactin     4.8 - 23.3 ng/mL 12.1    No FH of pituitary tumors, hypercalcemia, pancreatic or adrenal tumors.   She has family history of Diabetes, HTN, HL, heart disease, thyroid disease. Pt. also has a history of HTN, GERD, depression, fibromyalgia, and SLE - sees rheumatology - takes a lot of Prednisone (tapers, injection). No history of cancer or exposure to Radiation. She had VF abnormalities; also has blurry vision after her Covid19 episode.  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   Fibromyalgia    Hypertension    Lupus (  [place])    Migraine    Seasonal allergic rhinitis    Past Surgical History:  Procedure Laterality Date   CHOLECYSTECTOMY  2012   DILATION AND CURETTAGE OF UTERUS  2011   DILATION AND CURETTAGE OF UTERUS  01/17/2012   Procedure: DILATATION AND CURETTAGE;  Surgeon: Linda Pearson, MD;  Location: [facility];  Service: Gynecology;  Laterality: N/A;  Dilatation and curratage, insertion of Bakri Balloon   FOOT SURGERY     HARDWARE REMOVAL Right 02/18/2018   Procedure: HARDWARE REMOVAL RIGHT WRIST;  Surgeon: Linda Knows, MD;  Location: [facility];  Service: Orthopedics;  Laterality: Right;   OPEN REDUCTION INTERNAL FIXATION (ORIF) DISTAL RADIAL FRACTURE Right 11/26/2017   Procedure: OPEN REDUCTION INTERNAL FIXATION (ORIF) DISTAL RADIAL FRACTURE;  Surgeon: Linda Knows, MD;  Location: [facility];   Service: Orthopedics;  Laterality: Right;   TONSILLECTOMY     WRIST SURGERY Right 02/18/2018   Social History   Socioeconomic History   Marital status: Single    Spouse name: Not on file   Number of children: Not on file   Years of education: Not on file   Highest education level: Not on file  Occupational History   Not on file  Tobacco Use   Smoking status: Never   Smokeless tobacco: Never  Vaping Use   Vaping Use: Never used  Substance and Sexual Activity   Alcohol use: No   Drug use: No   Sexual activity: Yes    Birth control/protection: I.U.D.  Other Topics Concern   Not on file  Social History Narrative   Lives at home.   Social Determinants of Health   Financial Resource Strain: Not on file  Food Insecurity: Not on file  Transportation Needs: Not on file  Physical Activity: Not on file  Stress: Not on file  Social Connections: Not on file  Intimate Partner Violence: Not on file   Current Outpatient Medications on File Prior to Visit  Medication Sig Dispense Refill   acetaminophen (TYLENOL) 500 MG tablet Take 1,000 mg by mouth every 8 (eight) hours as needed for headache.      albuterol (VENTOLIN HFA) 108 (90 Base) MCG/ACT inhaler Inhale 2 puffs into the lungs every 6 (six) hours as needed for wheezing or shortness of breath. 1 each 2   amLODipine (NORVASC) 5 MG tablet Take 1 tablet (5 mg total) by mouth daily. Started per neurology to help with BP and migraine headaches 30 tablet 3   atenolol (TENORMIN) 50 MG tablet Take 1.5 tablets (75 mg total) by mouth daily. 45 tablet 2   Azelastine HCl 0.15 % SOLN INL 2 PFS IEN QD  12   butalbital-acetaminophen-caffeine (FIORICET) 50-325-40 MG tablet butalbital-acetaminophen-caffeine 50 mg-325 mg-40 mg tablet  TAKE 1 TABLET BY MOUTH EVERY 4 HOURS FOR UP TO 10 DAYS AS NEEDED FOR PAIN     cefUROXime (CEFTIN) 500 MG tablet Take 1 tablet (500 mg total) by mouth 2 (two) times daily with a meal. 20 tablet 0    chlorpheniramine-HYDROcodone (TUSSIONEX) 10-8 MG/5ML Take 5 mLs by mouth at bedtime as needed for cough. 115 mL 0   clobetasol cream (TEMOVATE) [id number] % Apply 1 application topically 2 (two) times daily. 60 g 1   DULoxetine (CYMBALTA) 30 MG capsule Take 1 capsule (30 mg total) by mouth 2 (two) times daily. 60 capsule 3   EMGALITY 120 MG/ML SOAJ Inject 1 mL into the skin every 30 (thirty) days.     EPIPEN 2-PAK 0.3 MG/0.3ML  SOAJ injection INJECT INTRAMUSCULARLY AS DIRECTED FOR ANAPHYLATIC REACTIONS  0   ergocalciferol (DRISDOL) 1.25 MG (50000 UT) capsule Take 1 capsule (50,000 Units total) by mouth once a week. 4 capsule 5   fluticasone (FLONASE) 50 MCG/ACT nasal spray Place 2 sprays into both nostrils daily. 16 g 0   hydrochlorothiazide (HYDRODIURIL) 25 MG tablet      hydroxychloroquine (PLAQUENIL) 200 MG tablet Take 200 mg by mouth 2 (two) times daily.     hydroxychloroquine (PLAQUENIL) 200 MG tablet Take by mouth.     hydrOXYzine (ATARAX) 25 MG tablet TAKE 1 TABLET(25 MG) BY MOUTH EVERY 8 HOURS AS NEEDED FOR ANXIETY 90 tablet 0   losartan (COZAAR) 50 MG tablet TAKE 1 TABLET(50 MG) BY MOUTH DAILY 90 tablet 1   ondansetron (ZOFRAN-ODT) 8 MG disintegrating tablet Take 1 tablet (8 mg total) by mouth 2 (two) times daily. 30 tablet 1   No current facility-administered medications on file prior to visit.   Allergies  Allergen Reactions   Ibuprofen Swelling    Lips swell   Bean Pod Extract Hives   Corn-Containing Products Hives   Peanut-Containing Drug Products Hives   Latex Other (See Comments)   Family History  Problem Relation Age of Onset   Hypertension Mother    Hypertension Father    Diabetes Father    Heart disease Maternal Grandmother    Heart disease Paternal Grandmother    Anesthesia problems Neg Hx    PE: BP (!) 132/100 (BP Location: Left Arm, Patient Position: Sitting, Cuff Size: Normal)   Pulse 79   Ht 5' 10.8" (1.798 m)   Wt 226 lb 6.4 oz (102.7 kg)   SpO2 99%   BMI  31.76 kg/m  Wt Readings from Last 3 Encounters:  01/13/23 226 lb 6.4 oz (102.7 kg)  11/12/22 223 lb (101.2 kg)  10/08/22 223 lb 1.9 oz (101.2 kg)   Constitutional: overweight, in NAD Eyes:  EOMI, no exophthalmos ENT: no neck masses, no cervical lymphadenopathy Cardiovascular: RRR, No MRG Respiratory: CTA B Musculoskeletal: no deformities Skin:no rashes Neurological: no tremor with outstretched hands  ASSESSMENT: 1. Pituitary incidentaloma  PLAN:  1. Patient returning after was found to have a very small pituitary mass, incidentally discovered during investigation for migraines. -Reviewing her MRI report from 07/2021, she had a 2 mm hyperintense focus in the left pituitary gland, either a microadenoma or a developmental cyst.  It was commented that this was new since 2017.  However, this was a noncontrasted MRI and we discussed at that time that we will need to repeat the MRI with contrast.  Will plan to do this now. -I did explain that this is not a brain tumor and these masses are extremely rarely malignant -We discussed that the pituitary adenoma can be: A cyst  A nodule (possibly with cystic degeneration since this had a high T2 signal) - Not producing hormones, not compressing the pituitary gland or the optic chiasm - Not producing hormones, but compressing adjacent structures -including the pituitary gland (causing hypopituitarism) or the optic chiasm (causing visual field cuts). She did have an abnormal visual field, however, we reviewed together the images and I pointed out that her pituitary tumor is very small, and does not appear to be extending outside the sella turcica, so it is quite far from the optic chiasm - I would not expect a mass-effect from - Producing hormones: - Prolactin (prolactinoma) - however, she has monthly menstrual cycles and no breast milk discharge.  Prolactin levels were normal. - ACTH (Cushing's disease) -at last visit, she reported recent weight gain  (reportedly 40 pounds, however, reviewing her chart, she gained 6 pounds between 04/01/2021 and 11/01/2021), with central distribution of fat, and also wide stretch marks, but it was possible that this is in the setting chronic steroids treatment.  She has a history of hypertension. - growth hormone (acromegaly) - she denies an enlarged jaw, increased shoe size, or ring size - LH or FSH (gonadotropin secreting tumor) -menstrual cycles are regular, but with occasional more bleeding.  Late, FSH, estradiol levels have been normal - TSH (TSH secreting tumor) (very rare) -TSH and free T4 levels have been normal -Her pituitary hormones were evaluated twice, in 2022 and 2023 and investigation was entirely negative -Will recheck this today  -At today's visit, as mentioned above, we will also repeat the MRI and if the pituitary mass is stable, I plan to see her back in 2 years for reevaluation -If her tumor is hormonally silent, there is no need for tumor resection.  We did discuss that this is usually done by transsphenoidal surgery -I plan to see the patient back in 2 years  Orders Placed This Encounter  Procedures   MR Brain W Wo Contrast   Prolactin   TSH   T4, free   T3, free   Cortisol   ACTH   Insulin-like growth factor   - Total time spent for the visit: 25 min, in obtaining medical information from the chart, reviewing her  previous labs, imaging evaluations, and treatments, reviewing her symptoms, counseling her about her condition (please see the discussed topics above), and developing a plan to further investigate and treat it.  Component     Latest Ref Rng 01/13/2023  T4,Free(Direct)     0.60 - 1.60 ng/dL 3.33   TSH     5.45 - 6.25 uIU/mL 2.12   Prolactin     ng/mL 8.3   Cortisol, Plasma     ug/dL 9.5   W389 ACTH     6 - 50 pg/mL 22   IGF-I, LC/MS     52 - 328 ng/mL 207   Z-Score (Female)     -2.0 - 2.0 SD 0.8   Triiodothyronine,Free,Serum     2.3 - 4.2 pg/mL 3.3   All labs  are normal.  Carlus Pavlov, MD PhD [facility] Endocrinology

## 2023-01-20 LAB — PROLACTIN: Prolactin: 8.3 ng/mL

## 2023-01-20 LAB — ACTH: C206 ACTH: 22 pg/mL (ref 6–50)

## 2023-01-20 LAB — INSULIN-LIKE GROWTH FACTOR
IGF-I, LC/MS: 207 ng/mL (ref 52–328)
Z-Score (Female): 0.8 SD (ref ?–2.0)

## 2023-02-10 ENCOUNTER — Ambulatory Visit: Payer: BC Managed Care – PPO | Admitting: Nurse Practitioner

## 2023-02-17 ENCOUNTER — Other Ambulatory Visit: Payer: BC Managed Care – PPO

## 2023-03-17 ENCOUNTER — Ambulatory Visit: Payer: BC Managed Care – PPO | Admitting: Nurse Practitioner

## 2023-03-24 ENCOUNTER — Other Ambulatory Visit: Payer: BC Managed Care – PPO

## 2023-03-26 ENCOUNTER — Encounter: Payer: Self-pay | Admitting: Nurse Practitioner

## 2023-03-26 ENCOUNTER — Ambulatory Visit: Payer: BC Managed Care – PPO | Admitting: Nurse Practitioner

## 2023-03-30 ENCOUNTER — Encounter: Payer: Self-pay | Admitting: Nurse Practitioner

## 2023-03-30 ENCOUNTER — Ambulatory Visit: Payer: BC Managed Care – PPO | Admitting: Nurse Practitioner

## 2023-03-30 VITALS — BP 174/98 | HR 79 | Ht 70.8 in | Wt 228.1 lb

## 2023-03-30 DIAGNOSIS — E538 Deficiency of other specified B group vitamins: Secondary | ICD-10-CM

## 2023-03-30 DIAGNOSIS — I10 Essential (primary) hypertension: Secondary | ICD-10-CM | POA: Diagnosis not present

## 2023-03-30 DIAGNOSIS — G43119 Migraine with aura, intractable, without status migrainosus: Secondary | ICD-10-CM

## 2023-03-30 DIAGNOSIS — E559 Vitamin D deficiency, unspecified: Secondary | ICD-10-CM | POA: Diagnosis not present

## 2023-03-30 MED ORDER — CYANOCOBALAMIN 1000 MCG/ML IJ SOLN
1000.0000 ug | Freq: Once | INTRAMUSCULAR | 0 refills | Status: DC
Start: 2023-03-30 — End: 2023-03-30

## 2023-03-30 MED ORDER — ERGOCALCIFEROL 1.25 MG (50000 UT) PO CAPS
50000.0000 [IU] | ORAL_CAPSULE | ORAL | 5 refills | Status: DC
Start: 2023-03-30 — End: 2024-09-07

## 2023-03-30 MED ORDER — PROPRANOLOL HCL ER 120 MG PO CP24
120.0000 mg | ORAL_CAPSULE | Freq: Every day | ORAL | 2 refills | Status: DC
Start: 2023-03-30 — End: 2023-05-14

## 2023-03-30 MED ORDER — CYANOCOBALAMIN 1000 MCG/ML IJ SOLN
1000.0000 ug | Freq: Once | INTRAMUSCULAR | Status: AC
Start: 2023-03-30 — End: 2023-03-30
  Administered 2023-03-30: 1000 ug via INTRAMUSCULAR

## 2023-03-30 NOTE — Progress Notes (Signed)
Established patient visit   Patient: Linda Vargas   DOB: 1976/03/14   47 y.o. Female  MRN: [id number] Visit Date: 03/30/2023   Chief Complaint  Patient presents with   Medical Management of Chronic Issues   Subjective    HPI  Follow up  -htn --has history of being labile -migraine headaches --now seeing neurology for this.  --taking Emgality monthly.  --frequency and severity of migraines has really improved.  --states  that she is having about 1 headache day per month.  -SLE --does have lupus specialist at [facility].  -GAD/depression --taking cymbalta 30 mg twice daily  --hydroxyzine 25 mg three times daily if needed   -recently had labs drawn per lupus provider.  -has moderate B12 deficiency -vitamin D deficiency despite taking Drisdol weekly.    Medications: Outpatient Medications Prior to Visit  Medication Sig   acetaminophen (TYLENOL) 500 MG tablet Take 1,000 mg by mouth every 8 (eight) hours as needed for headache.    albuterol (VENTOLIN HFA) 108 (90 Base) MCG/ACT inhaler Inhale 2 puffs into the lungs every 6 (six) hours as needed for wheezing or shortness of breath.   amLODipine (NORVASC) 5 MG tablet Take 1 tablet (5 mg total) by mouth daily. Started per neurology to help with BP and migraine headaches   Azelastine HCl 0.15 % SOLN INL 2 PFS IEN QD   butalbital-acetaminophen-caffeine (FIORICET) 50-325-40 MG tablet butalbital-acetaminophen-caffeine 50 mg-325 mg-40 mg tablet  TAKE 1 TABLET BY MOUTH EVERY 4 HOURS FOR UP TO 10 DAYS AS NEEDED FOR PAIN   chlorpheniramine-HYDROcodone (TUSSIONEX) 10-8 MG/5ML Take 5 mLs by mouth at bedtime as needed for cough.   clobetasol cream (TEMOVATE) 0.05 % Apply 1 application topically 2 (two) times daily.   DULoxetine (CYMBALTA) 30 MG capsule Take 1 capsule (30 mg total) by mouth 2 (two) times daily.   EPIPEN 2-PAK 0.3 MG/0.3ML SOAJ injection INJECT INTRAMUSCULARLY AS DIRECTED FOR ANAPHYLATIC REACTIONS   fluticasone (FLONASE) 50  MCG/ACT nasal spray Place 2 sprays into both nostrils daily.   hydrochlorothiazide (HYDRODIURIL) 25 MG tablet    hydroxychloroquine (PLAQUENIL) 200 MG tablet Take 200 mg by mouth 2 (two) times daily.   hydroxychloroquine (PLAQUENIL) 200 MG tablet Take by mouth.   hydrOXYzine (ATARAX) 25 MG tablet TAKE 1 TABLET(25 MG) BY MOUTH EVERY 8 HOURS AS NEEDED FOR ANXIETY   losartan (COZAAR) 50 MG tablet TAKE 1 TABLET(50 MG) BY MOUTH DAILY   ondansetron (ZOFRAN-ODT) 8 MG disintegrating tablet Take 1 tablet (8 mg total) by mouth 2 (two) times daily.   [DISCONTINUED] atenolol (TENORMIN) 50 MG tablet Take 1.5 tablets (75 mg total) by mouth daily.   [DISCONTINUED] ergocalciferol (DRISDOL) 1.25 MG (50000 UT) capsule Take 1 capsule (50,000 Units total) by mouth once a week.   EMGALITY 120 MG/ML SOAJ Inject 1 mL into the skin every 30 (thirty) days. (Patient not taking: Reported on 03/30/2023)   [DISCONTINUED] cefUROXime (CEFTIN) 500 MG tablet Take 1 tablet (500 mg total) by mouth 2 (two) times daily with a meal.   No facility-administered medications prior to visit.    Review of Systems See HPI    Last CBC Lab Results  Component Value Date   WBC 4.8 08/28/2021   HGB 12.9 08/28/2021   HCT 38.0 08/28/2021   MCV 82 08/28/2021   MCH 27.9 08/28/2021   RDW 12.2 08/28/2021   PLT 452 (H) 08/28/2021   Last metabolic panel Lab Results  Component Value Date   GLUCOSE 58 (L) 08/28/2021  NA 141 08/28/2021   K 5.2 08/28/2021   CL 105 08/28/2021   CO2 22 08/28/2021   BUN 11 08/28/2021   CREATININE 0.96 08/28/2021   EGFR 75 08/28/2021   CALCIUM 9.6 08/28/2021   PROT 7.6 08/28/2021   ALBUMIN 4.2 08/28/2021   LABGLOB 3.4 08/28/2021   AGRATIO 1.2 08/28/2021   BILITOT 0.3 08/28/2021   ALKPHOS 113 08/28/2021   AST 21 08/28/2021   ALT 15 08/28/2021   ANIONGAP 11 11/08/2020   Last lipids Lab Results  Component Value Date   CHOL 201 (H) 08/28/2021   HDL 79 08/28/2021   LDLCALC 112 (H) 08/28/2021    TRIG 53 08/28/2021   CHOLHDL 2.5 08/28/2021    Last thyroid functions Lab Results  Component Value Date   TSH 2.12 01/13/2023   Last vitamin D Lab Results  Component Value Date   VD25OH 11.1 (L) 04/04/2019       Objective     Today's Vitals   03/30/23 1619 03/30/23 1655  BP: (Abnormal) 165/95 (Abnormal) 174/98  Pulse: 79   SpO2: 100%   Weight: 228 lb 1.9 oz (103.5 kg)   Height: 5' 10.8" (1.798 m)    Body mass index is 32 kg/m.  BP Readings from Last 3 Encounters:  03/30/23 (Abnormal) 174/98  01/13/23 (Abnormal) 132/100  11/12/22 (Abnormal) 155/90    Wt Readings from Last 3 Encounters:  03/30/23 228 lb 1.9 oz (103.5 kg)  01/13/23 226 lb 6.4 oz (102.7 kg)  11/12/22 223 lb (101.2 kg)    Physical Exam Vitals and nursing note reviewed.  Constitutional:      Appearance: Normal appearance. She is well-developed.  HENT:     Head: Normocephalic and atraumatic.     Nose: Nose normal.     Mouth/Throat:     Mouth: Mucous membranes are moist.     Pharynx: Oropharynx is clear.  Eyes:     Extraocular Movements: Extraocular movements intact.     Conjunctiva/sclera: Conjunctivae normal.     Pupils: Pupils are equal, round, and reactive to light.  Neck:     Vascular: No carotid bruit.  Cardiovascular:     Rate and Rhythm: Normal rate and regular rhythm.     Pulses: Normal pulses.     Heart sounds: Normal heart sounds.  Pulmonary:     Effort: Pulmonary effort is normal.     Breath sounds: Normal breath sounds.  Abdominal:     Palpations: Abdomen is soft.  Musculoskeletal:        General: Normal range of motion.     Cervical back: Normal range of motion and neck supple.  Lymphadenopathy:     Cervical: No cervical adenopathy.  Skin:    General: Skin is warm and dry.     Capillary Refill: Capillary refill takes less than 2 seconds.  Neurological:     General: No focal deficit present.     Mental Status: She is alert and oriented to person, place, and time.   Psychiatric:        Mood and Affect: Mood normal.        Behavior: Behavior normal.        Thought Content: Thought content normal.        Judgment: Judgment normal.     Assessment & Plan    Essential hypertension Assessment & Plan: Increase propranolol ER to 120 mg daily.  -continue other BP medication as prescribed.  -limit daily intake of salt and increase water intake.  -  reassess in 6 weeks   Orders: -     Propranolol HCl ER; Take 1 capsule (120 mg total) by mouth daily.  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  -continue Emgality monthly.  -follow up with neurology as scheduled.   Orders: -     Propranolol HCl ER; Take 1 capsule (120 mg total) by mouth daily.  Dispense: 30 capsule; Refill: 2  Vitamin B12 deficiency Assessment & Plan: Vitamin b12 injection given during today's visit.  -will have her set up for monthly b12 injections.   Orders: -     Cyanocobalamin  Vitamin D deficiency Assessment & Plan: Increase dose drisdol to twice weekly  Orders: -     Ergocalciferol; Take 1 capsule (50,000 Units total) by mouth 2 (two) times a week.  Dispense: 8 capsule; Refill: 5     Return in about 6 weeks (around 05/11/2023) for blood pressure - changed atenolol to propranolol ER 120 mg daily - see below .         Carlean Jews, NP  [facility] at [facility] [phone] (phone) [phone] (fax)  [facility]

## 2023-04-01 ENCOUNTER — Inpatient Hospital Stay: Admission: RE | Admit: 2023-04-01 | Payer: BC Managed Care – PPO | Source: Ambulatory Visit

## 2023-04-08 ENCOUNTER — Encounter: Payer: Self-pay | Admitting: Podiatry

## 2023-04-12 NOTE — Assessment & Plan Note (Signed)
Increase dose drisdol to twice weekly

## 2023-04-12 NOTE — Assessment & Plan Note (Signed)
Increase propranolol ER to 120 mg daily.  -continue other BP medication as prescribed.  -limit daily intake of salt and increase water intake.  -reassess in 6 weeks

## 2023-04-12 NOTE — Assessment & Plan Note (Signed)
Improved.  -continue Emgality monthly.  -follow up with neurology as scheduled.

## 2023-04-12 NOTE — Assessment & Plan Note (Signed)
Vitamin b12 injection given during today's visit.  -will have her set up for monthly b12 injections.

## 2023-04-22 ENCOUNTER — Telehealth: Payer: BC Managed Care – PPO | Admitting: Family Medicine

## 2023-04-22 DIAGNOSIS — L509 Urticaria, unspecified: Secondary | ICD-10-CM | POA: Diagnosis not present

## 2023-04-22 DIAGNOSIS — L299 Pruritus, unspecified: Secondary | ICD-10-CM | POA: Diagnosis not present

## 2023-04-22 MED ORDER — PREDNISONE 10 MG (21) PO TBPK
ORAL_TABLET | ORAL | 0 refills | Status: DC
Start: 2023-04-22 — End: 2023-05-14

## 2023-04-22 NOTE — Progress Notes (Signed)
Virtual Visit Consent   Linda Vargas, you are scheduled for a virtual visit with a [facility] provider today. Just as with appointments in the office, your consent must be obtained to participate. Your consent will be active for this visit and any virtual visit you may have with one of our providers in the next 365 days. If you have a MyChart account, a copy of this consent can be sent to you electronically.  As this is a virtual visit, video technology does not allow for your provider to perform a traditional examination. This may limit your provider's ability to fully assess your condition. If your provider identifies any concerns that need to be evaluated in person or the need to arrange testing (such as labs, EKG, etc.), we will make arrangements to do so. Although advances in technology are sophisticated, we cannot ensure that it will always work on either your end or our end. If the connection with a video visit is poor, the visit may have to be switched to a telephone visit. With either a video or telephone visit, we are not always able to ensure that we have a secure connection.  By engaging in this virtual visit, you consent to the provision of healthcare and authorize for your insurance to be billed (if applicable) for the services provided during this visit. Depending on your insurance coverage, you may receive a charge related to this service.  I need to obtain your verbal consent now. Are you willing to proceed with your visit today? [name] has provided verbal consent on 04/22/2023 for a virtual visit (video or telephone). Freddy Finner, NP  Date: 04/22/2023 8:27 AM  Virtual Visit via Video Note   I, Freddy Finner, connected with  Linda Vargas  ([id number], 1976-04-01) on 04/22/23 at  8:30 AM EDT by a video-enabled telemedicine application and verified that I am speaking with the correct person using two identifiers.  Location: Patient: Virtual Visit Location  Patient: Home Provider: Virtual Visit Location Provider: Home Office   I discussed the limitations of evaluation and management by telemedicine and the availability of in person appointments. The patient expressed understanding and agreed to proceed.    History of Present Illness: Linda Vargas is a 47 y.o. who identifies as a female who was assigned female at birth, and is being seen today for hives.  Onset was last week- after traveling [place] and stayed in [place] but this place had carpet- and she started having allergy flare up Associated symptoms are hives, itching, sneezing Modifying factors are nasal spray, atarax, claritin, OTC benadryl cream and pill Denies chest pain, shortness of breath, fevers, chills   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   Body mass index (BMI) of 33.0-33.9 in adult 08/28/2021   Pituitary lesion (HCC) 07/24/2021   White matter disease 07/24/2021   Chronic pansinusitis 07/24/2021   Nausea and vomiting 07/01/2021   COVID-19 long hauler 06/19/2021   COVID-19 04/17/2021   Encounter to establish care 04/07/2021   Rash and nonspecific skin eruption 04/07/2021   Hypokalemia 04/07/2021   Encounter for general adult  medical examination with abnormal findings 09/23/2020   Acute otitis externa of both ears 04/05/2020   Primary insomnia 01/11/2020   Urinary tract infection with hematuria 09/10/2019   Dysuria 09/10/2019   Vitamin D deficiency 04/11/2019   Vitamin B12 deficiency 04/03/2019   Cough 12/12/2018   Fever and chills 10/20/2018   Acute upper respiratory  infection 10/20/2018   Fatigue 10/20/2018   Acute non-recurrent pansinusitis 06/09/2018   Essential hypertension 04/06/2018   Generalized anxiety disorder 04/06/2018   Depression, major, single episode, moderate (HCC) 04/06/2018   Depression 01/21/2017   Contusion of right foot 01/19/2017   Intractable migraine with aura without status migrainosus 12/12/2016   TIA (transient ischemic attack) 10/12/2015   Long-term use of Plaquenil 07/21/2014   Fibromyalgia 11/12/2012   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 , Disp: , Rfl:    albuterol (VENTOLIN HFA) 108 (90 Base) MCG/ACT inhaler, Inhale 2 puffs into the lungs every 6 (six) hours as needed for wheezing or shortness of breath., Disp: 1 each, Rfl: 2   amLODipine (NORVASC) 5 MG tablet, Take 1 tablet (5 mg total) by mouth daily. Started per neurology to help with BP and migraine headaches, Disp: 30 tablet, Rfl: 3   Azelastine HCl 0.15 % SOLN, INL 2 PFS IEN QD, Disp: , Rfl: 12   butalbital-acetaminophen-caffeine (FIORICET) 50-325-40 MG tablet, butalbital-acetaminophen-caffeine 50 mg-325 mg-40 mg tablet  TAKE 1 TABLET BY MOUTH EVERY 4 HOURS FOR UP TO 10 DAYS AS NEEDED FOR PAIN, Disp: , Rfl:    chlorpheniramine-HYDROcodone (TUSSIONEX) 10-8 MG/5ML, Take 5 mLs by mouth at bedtime as needed for cough., Disp: 115 mL, Rfl: 0   clobetasol cream (TEMOVATE) 0.05 %,  Apply 1 application topically 2 (two) times daily., Disp: 60 g, Rfl: 1   DULoxetine (CYMBALTA) 30 MG capsule, Take 1 capsule (30 mg total) by mouth 2 (two) times daily., Disp: 60 capsule, Rfl: 3   EMGALITY 120 MG/ML SOAJ, Inject 1 mL into the skin every 30 (thirty) days. (Patient not taking: Reported on 03/30/2023), Disp: , Rfl:    EPIPEN 2-PAK 0.3 MG/0.3ML SOAJ injection, INJECT INTRAMUSCULARLY AS DIRECTED FOR ANAPHYLATIC REACTIONS, Disp: , Rfl: 0   ergocalciferol (DRISDOL) 1.25 MG (50000 UT) capsule, Take 1 capsule (50,000 Units total) by mouth 2 (two) times a week., Disp: 8 capsule, Rfl: 5   fluticasone (FLONASE) 50 MCG/ACT nasal spray, Place 2 sprays into both nostrils daily., Disp: 16 g, Rfl: 0   hydrochlorothiazide (HYDRODIURIL) 25 MG tablet, , Disp: , Rfl:    hydroxychloroquine (PLAQUENIL) 200 MG tablet, Take 200 mg by mouth 2 (two) times daily., Disp: , Rfl:    hydroxychloroquine (PLAQUENIL) 200 MG tablet, Take by mouth., Disp: , Rfl:    hydrOXYzine (ATARAX) 25 MG tablet, TAKE 1 TABLET(25 MG) BY MOUTH EVERY 8 HOURS AS NEEDED FOR ANXIETY, Disp: 90 tablet, Rfl: 0   losartan (COZAAR) 50 MG tablet, TAKE 1 TABLET(50 MG)  BY MOUTH DAILY, Disp: 90 tablet, Rfl: 1   ondansetron (ZOFRAN-ODT) 8 MG disintegrating tablet, Take 1 tablet (8 mg total) by mouth 2 (two) times daily., Disp: 30 tablet, Rfl: 1   propranolol ER (INDERAL LA) 120 MG 24 hr capsule, Take 1 capsule (120 mg total) by mouth daily., Disp: 30 capsule, Rfl: 2  Observations/Objective: Patient is well-developed, well-nourished in no acute distress.  Resting comfortably  at home.  Head is normocephalic, atraumatic.  No labored breathing.  Speech is clear and coherent with logical content.  Patient is alert and oriented at baseline.    Assessment and Plan:  1. Hives  - predniSONE (STERAPRED UNI-PAK 21 TAB) 10 MG (21) TBPK tablet; Take as directed  Dispense: 21 tablet; Refill: 0  2. Itching  -continue to use OTC oral  antihistamine -start pred dose pack to help get hives uncontrol -follow up in person if not improving  Reviewed side effects, risks and benefits of medication.    Patient acknowledged agreement and understanding of the plan.   Past Medical, Surgical, Social History, Allergies, and Medications have been Reviewed.    Follow Up Instructions: I discussed the assessment and treatment plan with the patient. The patient was provided an opportunity to ask questions and all were answered. The patient agreed with the plan and demonstrated an understanding of the instructions.  A copy of instructions were sent to the patient via MyChart unless otherwise noted below.     The patient was advised to call back or seek an in-person evaluation if the symptoms worsen or if the condition fails to improve as anticipated.  Time:  I spent 10 minutes with the patient via telehealth technology discussing the above problems/concerns.    Freddy Finner, NP

## 2023-04-22 NOTE — Patient Instructions (Signed)
Linda Vargas, thank you for joining Freddy Finner, NP for today's virtual visit.  While this provider is not your primary care provider (PCP), if your PCP is located in our provider database this encounter information will be shared with them immediately following your visit.   A [facility] MyChart account gives you access to today's visit and all your visits, tests, and labs performed at [facility] " click here if you don't have a  MyChart account or go to [email or web address]  Consent: (Patient) Linda Vargas provided verbal consent for this virtual visit at the beginning of the encounter.  Current Medications:  Current Outpatient Medications:    acetaminophen (TYLENOL) 500 MG tablet, Take 1,000 mg by mouth every 8 (eight) hours as needed for headache. , Disp: , Rfl:    albuterol (VENTOLIN HFA) 108 (90 Base) MCG/ACT inhaler, Inhale 2 puffs into the lungs every 6 (six) hours as needed for wheezing or shortness of breath., Disp: 1 each, Rfl: 2   amLODipine (NORVASC) 5 MG tablet, Take 1 tablet (5 mg total) by mouth daily. Started per neurology to help with BP and migraine headaches, Disp: 30 tablet, Rfl: 3   Azelastine HCl 0.15 % SOLN, INL 2 PFS IEN QD, Disp: , Rfl: 12   butalbital-acetaminophen-caffeine (FIORICET) 50-325-40 MG tablet, butalbital-acetaminophen-caffeine 50 mg-325 mg-40 mg tablet  TAKE 1 TABLET BY MOUTH EVERY 4 HOURS FOR UP TO 10 DAYS AS NEEDED FOR PAIN, Disp: , Rfl:    chlorpheniramine-HYDROcodone (TUSSIONEX) 10-8 MG/5ML, Take 5 mLs by mouth at bedtime as needed for cough., Disp: 115 mL, Rfl: 0   clobetasol cream (TEMOVATE) 0.05 %, Apply 1 application topically 2 (two) times daily., Disp: 60 g, Rfl: 1   DULoxetine (CYMBALTA) 30 MG capsule, Take 1 capsule (30 mg total) by mouth 2 (two) times daily., Disp: 60 capsule, Rfl: 3   EMGALITY 120 MG/ML SOAJ, Inject 1 mL into the skin every 30 (thirty) days. (Patient not taking: Reported on  03/30/2023), Disp: , Rfl:    EPIPEN 2-PAK 0.3 MG/0.3ML SOAJ injection, INJECT INTRAMUSCULARLY AS DIRECTED FOR ANAPHYLATIC REACTIONS, Disp: , Rfl: 0   ergocalciferol (DRISDOL) 1.25 MG (50000 UT) capsule, Take 1 capsule (50,000 Units total) by mouth 2 (two) times a week., Disp: 8 capsule, Rfl: 5   fluticasone (FLONASE) 50 MCG/ACT nasal spray, Place 2 sprays into both nostrils daily., Disp: 16 g, Rfl: 0   hydrochlorothiazide (HYDRODIURIL) 25 MG tablet, , Disp: , Rfl:    hydroxychloroquine (PLAQUENIL) 200 MG tablet, Take 200 mg by mouth 2 (two) times daily., Disp: , Rfl:    hydroxychloroquine (PLAQUENIL) 200 MG tablet, Take by mouth., Disp: , Rfl:    hydrOXYzine (ATARAX) 25 MG tablet, TAKE 1 TABLET(25 MG) BY MOUTH EVERY 8 HOURS AS NEEDED FOR ANXIETY, Disp: 90 tablet, Rfl: 0   losartan (COZAAR) 50 MG tablet, TAKE 1 TABLET(50 MG) BY MOUTH DAILY, Disp: 90 tablet, Rfl: 1   ondansetron (ZOFRAN-ODT) 8 MG disintegrating tablet, Take 1 tablet (8 mg total) by mouth 2 (two) times daily., Disp: 30 tablet, Rfl: 1   predniSONE (STERAPRED UNI-PAK 21 TAB) 10 MG (21) TBPK tablet, Take as directed, Disp: 21 tablet, Rfl: 0   propranolol ER (INDERAL LA) 120 MG 24 hr capsule, Take 1 capsule (120 mg total) by mouth daily., Disp: 30 capsule, Rfl: 2   Medications ordered in this encounter:  Meds ordered this encounter  Medications   predniSONE (STERAPRED UNI-PAK 21 TAB) 10 MG (21) TBPK tablet  Sig: Take as directed    Dispense:  21 tablet    Refill:  0    Order Specific Question:   Supervising Provider    Answer:   Merrilee Jansky [id number]     *If you need refills on other medications prior to your next appointment, please contact your pharmacy*  Follow-Up: Call back or seek an in-person evaluation if the symptoms worsen or if the condition fails to improve as anticipated.  [facility] [phone]  Other Instructions Hives Hives are itchy, red, swollen areas on your skin. They can show up  on any part of your body. They often go away within 24 hours (acute hives). If you get new hives after the old ones fade and this goes on for many days or weeks, it is called chronic hives. Hives do not spread from person to person (are not contagious). Hives can happen when your body reacts to something that you are allergic to (allergen). These are sometimes called triggers. You can get hives right after being around a trigger, or hours later. What are the causes? Food allergies. Insect bites or stings. Allergies to pollen or pets. Spending time in sunlight, heat, or cold. Exercise. Stress. Other causes, such as: Viruses. This includes the common cold. Infections caused by germs (bacteria). Some medicines. Chemicals or latex. Allergy shots. Blood transfusions. In some cases, the cause is not known. What increases the risk? Being female. Being allergic to foods, such as: Citrus fruits. Milk. Eggs. Peanuts. Tree nuts. Shellfish. Being allergic to: Medicines. Latex. Insects. Animals. Pollen. What are the signs or symptoms?  Itchy, red or white bumps or spots on your skin. These areas may: Swell and get bigger. Change in shape and location. Stand alone or connect to each other over a large area of skin. Sting or hurt. Turn white when pressed in the center (blanch). In very bad cases, your hands, feet, and face may also swell. This may happen if hives start deeper in your skin. How is this treated? Treatment for hives depends on your symptoms. You may need to: Use cool, wet cloths (cool compresses) or take cool showers to stop the itching. Take or apply medicines to: Help with itching (antihistamines). Lessen swelling (corticosteroids). Treat infection (antibiotics). Have a medicine called omalizumab given to you as a shot. You may need this if your hives do not get better with other treatments. In very bad cases, you may need to use a device filled with medicine that  gives an emergency shot of epinephrine (auto-injector pen) to stop a very bad allergic reaction (anaphylactic reaction). Follow these instructions at home: Medicines Take or apply over-the-counter and prescription medicines only as told by your doctor. If you were prescribed antibiotics, use them as told by your doctor. Do not stop using them even if you start to feel better. Skin care Put cool, wet cloths on the hives. Do not scratch your skin. Do not rub your skin. General instructions Do not take hot showers or baths. This can make itching worse. Do not wear tight clothes. Use sunscreen. Wear clothes that cover your skin when you are outside. Avoid triggers that cause your hives. Keep a journal to help track what causes your hives. Write down: What medicines you take. What you eat and drink. What you put on your skin. Keep all follow-up visits. Your doctor will need to make sure treatment is working. Contact a doctor if: Your symptoms do not get better with medicine.  Your joints hurt or swell. You have a fever. You have pain in your belly (abdomen). Get help right away if: Your tongue or lips swell. Your eyelids are swollen. Your chest or throat feels tight. You have trouble breathing or swallowing. These symptoms may be an emergency. Get help right away. Call 911. Do not wait to see if the symptoms will go away. Do not drive yourself to the hospital. This information is not intended to replace advice given to you by your health care provider. Make sure you discuss any questions you have with your health care provider. Document Revised: 06/17/2022 Document Reviewed: 06/17/2022 Elsevier Patient Education  2024 [employer]   If you have been instructed to have an in-person evaluation today at a local Urgent Care facility, please use the link below. It will take you to a list of all of our available [facility], including address, phone number and hours of  operation. Please do not delay care.  [facility]  If you or a family member do not have a primary care provider, use the link below to schedule a visit and establish care. When you choose a [facility] primary care physician or advanced practice provider, you gain a long-term partner in health. Find a Primary Care Provider  Learn more about [facility] in-office and virtual care options: [facility] - Get Care Now

## 2023-04-29 ENCOUNTER — Ambulatory Visit: Payer: BC Managed Care – PPO

## 2023-04-30 ENCOUNTER — Telehealth: Payer: BC Managed Care – PPO | Admitting: Physician Assistant

## 2023-04-30 DIAGNOSIS — J019 Acute sinusitis, unspecified: Secondary | ICD-10-CM

## 2023-04-30 DIAGNOSIS — B9689 Other specified bacterial agents as the cause of diseases classified elsewhere: Secondary | ICD-10-CM | POA: Diagnosis not present

## 2023-04-30 MED ORDER — AMOXICILLIN-POT CLAVULANATE 875-125 MG PO TABS
1.0000 | ORAL_TABLET | Freq: Two times a day (BID) | ORAL | 0 refills | Status: DC
Start: 2023-04-30 — End: 2023-07-19

## 2023-04-30 NOTE — Progress Notes (Signed)
Virtual Visit Consent   Linda Vargas, you are scheduled for a virtual visit with a [facility] provider today. Just as with appointments in the office, your consent must be obtained to participate. Your consent will be active for this visit and any virtual visit you may have with one of our providers in the next 365 days. If you have a MyChart account, a copy of this consent can be sent to you electronically.  As this is a virtual visit, video technology does not allow for your provider to perform a traditional examination. This may limit your provider's ability to fully assess your condition. If your provider identifies any concerns that need to be evaluated in person or the need to arrange testing (such as labs, EKG, etc.), we will make arrangements to do so. Although advances in technology are sophisticated, we cannot ensure that it will always work on either your end or our end. If the connection with a video visit is poor, the visit may have to be switched to a telephone visit. With either a video or telephone visit, we are not always able to ensure that we have a secure connection.  By engaging in this virtual visit, you consent to the provision of healthcare and authorize for your insurance to be billed (if applicable) for the services provided during this visit. Depending on your insurance coverage, you may receive a charge related to this service.  I need to obtain your verbal consent now. Are you willing to proceed with your visit today? CYSTAL SHANNAHAN has provided verbal consent on 04/30/2023 for a virtual visit (video or telephone). Margaretann Loveless, PA-C  Date: 04/30/2023 3:14 PM  Virtual Visit via Video Note   I, Margaretann Loveless, connected with  Linda Vargas  ([id number], 1976/04/19) on 04/30/23 at  3:15 PM EDT by a video-enabled telemedicine application and verified that I am speaking with the correct person using two identifiers.  Location: Patient: Virtual  Visit Location Patient: Home Provider: Virtual Visit Location Provider: Home Office   I discussed the limitations of evaluation and management by telemedicine and the availability of in person appointments. The patient expressed understanding and agreed to proceed.    History of Present Illness: Linda Vargas is a 47 y.o. who identifies as a female who was assigned female at birth, and is being seen today for possible sinus infection.  HPI: Sinusitis This is a new problem. The current episode started in the past 7 days (Seen at [facility] yesterday, 04/29/23, and diagnosed with Viral URI; advised to take Coricidin, negative Covid 19 testing; symptoms worsened overnight). The problem has been gradually worsening since onset. There has been no fever. Associated symptoms include congestion, coughing, ear pain, headaches, a hoarse voice, sinus pressure (in face) and a sore throat (yesterday was scratchy, today resolved). Pertinent negatives include no chills or shortness of breath. (myalgias) Treatments tried: coricidin, tylenol, mucinex, daily allergy medications. The treatment provided no relief.     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   Body mass index (BMI) of 33.0-33.9 in adult 08/28/2021   Pituitary lesion (HCC)  07/24/2021   White matter disease 07/24/2021   Chronic pansinusitis 07/24/2021   Nausea and vomiting 07/01/2021   COVID-19 long hauler 06/19/2021   COVID-19 04/17/2021   Encounter to establish care 04/07/2021   Rash and nonspecific skin eruption 04/07/2021   Hypokalemia 04/07/2021   Encounter  for general adult medical examination with abnormal findings 09/23/2020   Acute otitis externa of both ears 04/05/2020   Primary insomnia 01/11/2020   Urinary tract infection with hematuria 09/10/2019   Dysuria 09/10/2019   Vitamin D deficiency 04/11/2019   Vitamin B12 deficiency 04/03/2019   Cough 12/12/2018   Fever and chills 10/20/2018   Acute upper respiratory infection 10/20/2018   Fatigue 10/20/2018   Acute non-recurrent pansinusitis 06/09/2018   Essential hypertension 04/06/2018   Generalized anxiety disorder 04/06/2018   Depression, major, single episode, moderate (HCC) 04/06/2018   Depression 01/21/2017   Contusion of right foot 01/19/2017   Intractable migraine with aura without status migrainosus 12/12/2016   TIA (transient ischemic attack) 10/12/2015   Long-term use of Plaquenil 07/21/2014   Fibromyalgia 11/12/2012   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   Pregnancy, supervision of, high-risk 06/27/2011   Systemic lupus erythematosus (HCC) 06/27/2011   Recurrent miscarriages 06/27/2011   Vaginal bleeding in pregnancy 06/27/2011   Vaginal lesion 06/27/2011    Allergies:  Allergies  Allergen Reactions   Ibuprofen Swelling    Lips swell   Bean Pod Extract Hives   Corn-Containing Products Hives   Peanut-Containing Drug Products Hives   Latex Other (See Comments)   Medications:  Current Outpatient Medications:    amoxicillin-clavulanate (AUGMENTIN) 875-125 MG tablet, Take 1 tablet by mouth 2 (two) times daily., Disp: 20 tablet, Rfl: 0   acetaminophen (TYLENOL) 500 MG tablet, Take 1,000 mg by mouth every 8 (eight) hours as needed for headache. , Disp: , Rfl:    albuterol (VENTOLIN HFA) 108 (90 Base) MCG/ACT inhaler, Inhale 2 puffs into the lungs every 6 (six) hours as needed for wheezing or shortness of breath., Disp: 1 each, Rfl: 2   amLODipine (NORVASC) 5 MG tablet, Take 1 tablet (5 mg total) by mouth daily. Started per neurology to help with  BP and migraine headaches, Disp: 30 tablet, Rfl: 3   Azelastine HCl 0.15 % SOLN, INL 2 PFS IEN QD, Disp: , Rfl: 12   butalbital-acetaminophen-caffeine (FIORICET) 50-325-40 MG tablet, butalbital-acetaminophen-caffeine 50 mg-325 mg-40 mg tablet  TAKE 1 TABLET BY MOUTH EVERY 4 HOURS FOR UP TO 10 DAYS AS NEEDED FOR PAIN, Disp: , Rfl:    chlorpheniramine-HYDROcodone (TUSSIONEX) 10-8 MG/5ML, Take 5 mLs by mouth at bedtime as needed for cough., Disp: 115 mL, Rfl: 0   clobetasol cream (TEMOVATE) 0.05 %, Apply 1 application topically 2 (two) times daily., Disp: 60 g, Rfl: 1   DULoxetine (CYMBALTA) 30 MG capsule, Take 1 capsule (30 mg total) by mouth 2 (two) times daily., Disp: 60 capsule, Rfl: 3   EMGALITY 120 MG/ML SOAJ, Inject 1 mL into the skin every 30 (thirty) days. (Patient not taking: Reported on 03/30/2023), Disp: , Rfl:    EPIPEN 2-PAK 0.3 MG/0.3ML SOAJ injection, INJECT INTRAMUSCULARLY AS DIRECTED FOR ANAPHYLATIC REACTIONS, Disp: , Rfl: 0   ergocalciferol (DRISDOL) 1.25 MG (50000 UT) capsule, Take 1 capsule (50,000 Units total) by mouth 2 (two) times a week., Disp: 8 capsule, Rfl: 5   fluticasone (FLONASE) 50 MCG/ACT nasal spray, Place 2 sprays into both nostrils daily., Disp: 16 g, Rfl: 0   hydrochlorothiazide (HYDRODIURIL) 25 MG tablet, ,  Disp: , Rfl:    hydroxychloroquine (PLAQUENIL) 200 MG tablet, Take 200 mg by mouth 2 (two) times daily., Disp: , Rfl:    hydroxychloroquine (PLAQUENIL) 200 MG tablet, Take by mouth., Disp: , Rfl:    hydrOXYzine (ATARAX) 25 MG tablet, TAKE 1 TABLET(25 MG) BY MOUTH EVERY 8 HOURS AS NEEDED FOR ANXIETY, Disp: 90 tablet, Rfl: 0   losartan (COZAAR) 50 MG tablet, TAKE 1 TABLET(50 MG) BY MOUTH DAILY, Disp: 90 tablet, Rfl: 1   ondansetron (ZOFRAN-ODT) 8 MG disintegrating tablet, Take 1 tablet (8 mg total) by mouth 2 (two) times daily., Disp: 30 tablet, Rfl: 1   predniSONE (STERAPRED UNI-PAK 21 TAB) 10 MG (21) TBPK tablet, Take as directed, Disp: 21 tablet, Rfl: 0    propranolol ER (INDERAL LA) 120 MG 24 hr capsule, Take 1 capsule (120 mg total) by mouth daily., Disp: 30 capsule, Rfl: 2  Observations/Objective: Patient is well-developed, well-nourished in no acute distress.  Resting comfortably at home.  Head is normocephalic, atraumatic.  No labored breathing.  Speech is clear and coherent with logical content.  Patient is alert and oriented at baseline.    Assessment and Plan: 1. Acute bacterial sinusitis - amoxicillin-clavulanate (AUGMENTIN) 875-125 MG tablet; Take 1 tablet by mouth 2 (two) times daily.  Dispense: 20 tablet; Refill: 0  - Worsening symptoms that have not responded to OTC medications.  - Will give Augmentin - Continue allergy medications.  - Steam and humidifier can help - Stay well hydrated and get plenty of rest.  - Seek in person evaluation if no symptom improvement or if symptoms worsen   Follow Up Instructions: I discussed the assessment and treatment plan with the patient. The patient was provided an opportunity to ask questions and all were answered. The patient agreed with the plan and demonstrated an understanding of the instructions.  A copy of instructions were sent to the patient via MyChart unless otherwise noted below.    The patient was advised to call back or seek an in-person evaluation if the symptoms worsen or if the condition fails to improve as anticipated.  Time:  I spent 8 minutes with the patient via telehealth technology discussing the above problems/concerns.    Margaretann Loveless, PA-C

## 2023-04-30 NOTE — Patient Instructions (Signed)
Linda Vargas, thank you for joining Margaretann Loveless, PA-C for today's virtual visit.  While this provider is not your primary care provider (PCP), if your PCP is located in our provider database this encounter information will be shared with them immediately following your visit.   A [facility] MyChart account gives you access to today's visit and all your visits, tests, and labs performed at [facility] " click here if you don't have a [facility] MyChart account or go to [email or web address]  Consent: (Patient) Linda Vargas provided verbal consent for this virtual visit at the beginning of the encounter.  Current Medications:  Current Outpatient Medications:    amoxicillin-clavulanate (AUGMENTIN) 875-125 MG tablet, Take 1 tablet by mouth 2 (two) times daily., Disp: 20 tablet, Rfl: 0   acetaminophen (TYLENOL) 500 MG tablet, Take 1,000 mg by mouth every 8 (eight) hours as needed for headache. , Disp: , Rfl:    albuterol (VENTOLIN HFA) 108 (90 Base) MCG/ACT inhaler, Inhale 2 puffs into the lungs every 6 (six) hours as needed for wheezing or shortness of breath., Disp: 1 each, Rfl: 2   amLODipine (NORVASC) 5 MG tablet, Take 1 tablet (5 mg total) by mouth daily. Started per neurology to help with BP and migraine headaches, Disp: 30 tablet, Rfl: 3   Azelastine HCl 0.15 % SOLN, INL 2 PFS IEN QD, Disp: , Rfl: 12   butalbital-acetaminophen-caffeine (FIORICET) 50-325-40 MG tablet, butalbital-acetaminophen-caffeine 50 mg-325 mg-40 mg tablet  TAKE 1 TABLET BY MOUTH EVERY 4 HOURS FOR UP TO 10 DAYS AS NEEDED FOR PAIN, Disp: , Rfl:    chlorpheniramine-HYDROcodone (TUSSIONEX) 10-8 MG/5ML, Take 5 mLs by mouth at bedtime as needed for cough., Disp: 115 mL, Rfl: 0   clobetasol cream (TEMOVATE) 0.05 %, Apply 1 application topically 2 (two) times daily., Disp: 60 g, Rfl: 1   DULoxetine (CYMBALTA) 30 MG capsule, Take 1 capsule (30 mg total) by mouth 2 (two) times daily., Disp: 60  capsule, Rfl: 3   EMGALITY 120 MG/ML SOAJ, Inject 1 mL into the skin every 30 (thirty) days. (Patient not taking: Reported on 03/30/2023), Disp: , Rfl:    EPIPEN 2-PAK 0.3 MG/0.3ML SOAJ injection, INJECT INTRAMUSCULARLY AS DIRECTED FOR ANAPHYLATIC REACTIONS, Disp: , Rfl: 0   ergocalciferol (DRISDOL) 1.25 MG (50000 UT) capsule, Take 1 capsule (50,000 Units total) by mouth 2 (two) times a week., Disp: 8 capsule, Rfl: 5   fluticasone (FLONASE) 50 MCG/ACT nasal spray, Place 2 sprays into both nostrils daily., Disp: 16 g, Rfl: 0   hydrochlorothiazide (HYDRODIURIL) 25 MG tablet, , Disp: , Rfl:    hydroxychloroquine (PLAQUENIL) 200 MG tablet, Take 200 mg by mouth 2 (two) times daily., Disp: , Rfl:    hydroxychloroquine (PLAQUENIL) 200 MG tablet, Take by mouth., Disp: , Rfl:    hydrOXYzine (ATARAX) 25 MG tablet, TAKE 1 TABLET(25 MG) BY MOUTH EVERY 8 HOURS AS NEEDED FOR ANXIETY, Disp: 90 tablet, Rfl: 0   losartan (COZAAR) 50 MG tablet, TAKE 1 TABLET(50 MG) BY MOUTH DAILY, Disp: 90 tablet, Rfl: 1   ondansetron (ZOFRAN-ODT) 8 MG disintegrating tablet, Take 1 tablet (8 mg total) by mouth 2 (two) times daily., Disp: 30 tablet, Rfl: 1   predniSONE (STERAPRED UNI-PAK 21 TAB) 10 MG (21) TBPK tablet, Take as directed, Disp: 21 tablet, Rfl: 0   propranolol ER (INDERAL LA) 120 MG 24 hr capsule, Take 1 capsule (120 mg total) by mouth daily., Disp: 30 capsule, Rfl: 2   Medications ordered in this  encounter:  Meds ordered this encounter  Medications   amoxicillin-clavulanate (AUGMENTIN) 875-125 MG tablet    Sig: Take 1 tablet by mouth 2 (two) times daily.    Dispense:  20 tablet    Refill:  0    Order Specific Question:   Supervising Provider    Answer:   Merrilee Jansky [id number]     *If you need refills on other medications prior to your next appointment, please contact your pharmacy*  Follow-Up: Call back or seek an in-person evaluation if the symptoms worsen or if the condition fails to improve as  anticipated.   Virtual Care [phone]  Other Instructions Sinus Infection, Adult A sinus infection, also called sinusitis, is inflammation of your sinuses. Sinuses are hollow spaces in the bones around your face. Your sinuses are located: Around your eyes. In the middle of your forehead. Behind your nose. In your cheekbones. Mucus normally drains out of your sinuses. When your nasal tissues become inflamed or swollen, mucus can become trapped or blocked. This allows bacteria, viruses, and fungi to grow, which leads to infection. Most infections of the sinuses are caused by a virus. A sinus infection can develop quickly. It can last for up to 4 weeks (acute) or for more than 12 weeks (chronic). A sinus infection often develops after a cold. What are the causes? This condition is caused by anything that creates swelling in the sinuses or stops mucus from draining. This includes: Allergies. Asthma. Infection from bacteria or viruses. Deformities or blockages in your nose or sinuses. Abnormal growths in the nose (nasal polyps). Pollutants, such as chemicals or irritants in the air. Infection from fungi. This is rare. What increases the risk? You are more likely to develop this condition if you: Have a weak body defense system (immune system). Do a lot of swimming or diving. Overuse nasal sprays. Smoke. What are the signs or symptoms? The main symptoms of this condition are pain and a feeling of pressure around the affected sinuses. Other symptoms include: Stuffy nose or congestion that makes it difficult to breathe through your nose. Thick yellow or greenish drainage from your nose. Tenderness, swelling, and warmth over the affected sinuses. A cough that may get worse at night. Decreased sense of smell and taste. Extra mucus that collects in the throat or the back of the nose (postnasal drip) causing a sore throat or bad breath. Tiredness (fatigue). Fever. How is  this diagnosed? This condition is diagnosed based on: Your symptoms. Your medical history. A physical exam. Tests to find out if your condition is acute or chronic. This may include: Checking your nose for nasal polyps. Viewing your sinuses using a device that has a light (endoscope). Testing for allergies or bacteria. Imaging tests, such as an MRI or CT scan. In rare cases, a bone biopsy may be done to rule out more serious types of fungal sinus disease. How is this treated? Treatment for a sinus infection depends on the cause and whether your condition is chronic or acute. If caused by a virus, your symptoms should go away on their own within 10 days. You may be given medicines to relieve symptoms. They include: Medicines that shrink swollen nasal passages (decongestants). A spray that eases inflammation of the nostrils (topical intranasal corticosteroids). Rinses that help get rid of thick mucus in your nose (nasal saline washes). Medicines that treat allergies (antihistamines). Over-the-counter pain relievers. If caused by bacteria, your health care provider may recommend waiting to see  if your symptoms improve. Most bacterial infections will get better without antibiotic medicine. You may be given antibiotics if you have: A severe infection. A weak immune system. If caused by narrow nasal passages or nasal polyps, surgery may be needed. Follow these instructions at home: Medicines Take, use, or apply over-the-counter and prescription medicines only as told by your health care provider. These may include nasal sprays. If you were prescribed an antibiotic medicine, take it as told by your health care provider. Do not stop taking the antibiotic even if you start to feel better. Hydrate and humidify  Drink enough fluid to keep your urine pale yellow. Staying hydrated will help to thin your mucus. Use a cool mist humidifier to keep the humidity level in your home above 50%. Inhale  steam for 10-15 minutes, 3-4 times a day, or as told by your health care provider. You can do this in the bathroom while a hot shower is running. Limit your exposure to cool or dry air. Rest Rest as much as possible. Sleep with your head raised (elevated). Make sure you get enough sleep each night. General instructions  Apply a warm, moist washcloth to your face 3-4 times a day or as told by your health care provider. This will help with discomfort. Use nasal saline washes as often as told by your health care provider. Wash your hands often with soap and water to reduce your exposure to germs. If soap and water are not available, use hand sanitizer. Do not smoke. Avoid being around people who are smoking (secondhand smoke). Keep all follow-up visits. This is important. Contact a health care provider if: You have a fever. Your symptoms get worse. Your symptoms do not improve within 10 days. Get help right away if: You have a severe headache. You have persistent vomiting. You have severe pain or swelling around your face or eyes. You have vision problems. You develop confusion. Your neck is stiff. You have trouble breathing. These symptoms may be an emergency. Get help right away. Call 911. Do not wait to see if the symptoms will go away. Do not drive yourself to the hospital. Summary A sinus infection is soreness and inflammation of your sinuses. Sinuses are hollow spaces in the bones around your face. This condition is caused by nasal tissues that become inflamed or swollen. The swelling traps or blocks the flow of mucus. This allows bacteria, viruses, and fungi to grow, which leads to infection. If you were prescribed an antibiotic medicine, take it as told by your health care provider. Do not stop taking the antibiotic even if you start to feel better. Keep all follow-up visits. This is important. This information is not intended to replace advice given to you by your health care  provider. Make sure you discuss any questions you have with your health care provider. Document Revised: 09/03/2021 Document Reviewed: 09/03/2021 Elsevier Patient Education  2024 Elsevier Inc.    If you have been instructed to have an in-person evaluation today at a local Urgent Care facility, please use the link below. It will take you to a list of all of our available [facility], including address, phone number and hours of operation. Please do not delay care.  [facility]  If you or a family member do not have a primary care provider, use the link below to schedule a visit and establish care. When you choose a [facility] primary care physician or advanced practice provider, you gain  a long-term partner in health. Find a Primary Care Provider  Learn more about [facility]'s in-office and virtual care options: [facility] - Get Care Now

## 2023-05-07 ENCOUNTER — Ambulatory Visit: Payer: BC Managed Care – PPO | Admitting: Podiatry

## 2023-05-11 ENCOUNTER — Ambulatory Visit: Payer: BC Managed Care – PPO | Admitting: Podiatry

## 2023-05-11 ENCOUNTER — Encounter: Payer: Self-pay | Admitting: Podiatry

## 2023-05-11 DIAGNOSIS — M7752 Other enthesopathy of left foot: Secondary | ICD-10-CM

## 2023-05-11 DIAGNOSIS — D2372 Other benign neoplasm of skin of left lower limb, including hip: Secondary | ICD-10-CM

## 2023-05-11 DIAGNOSIS — M778 Other enthesopathies, not elsewhere classified: Secondary | ICD-10-CM

## 2023-05-11 MED ORDER — DEXAMETHASONE SODIUM PHOSPHATE 120 MG/30ML IJ SOLN
2.0000 mg | Freq: Once | INTRAMUSCULAR | Status: AC
Start: 2023-05-11 — End: 2023-05-11
  Administered 2023-05-11: 2 mg via INTRA_ARTICULAR

## 2023-05-11 NOTE — Progress Notes (Signed)
She presents today after having not seen her since January.  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  She is complaining of painful subfifth lesion left.  Objective: Vital signs are stable alert and oriented x 3.  Pulses are palpable.  She has palpable bursa beneath the fifth metatarsal head of the left foot with a large cornified benign skin lesion is exquisitely painful.  Assessment: Subfifth metatarsal bursitis pes planovalgus and benign skin lesion.  Plan: Debrided the benign skin lesions today bilaterally I also injected the bursa with 4 mg of dexamethasone and local anesthetic she tolerated that procedure well without complications and we are going to get her scheduled to have orthotics scanned.  We will be offloading the first and the fifth metatarsals on both orthotics.

## 2023-05-11 NOTE — Addendum Note (Signed)
Addended by: Lottie Rater E on: 05/11/2023 10:14 AM   Modules accepted: Orders

## 2023-05-12 ENCOUNTER — Ambulatory Visit: Payer: BC Managed Care – PPO | Admitting: Podiatry

## 2023-05-13 ENCOUNTER — Ambulatory Visit (INDEPENDENT_AMBULATORY_CARE_PROVIDER_SITE_OTHER): Payer: BC Managed Care – PPO | Admitting: Family Medicine

## 2023-05-13 DIAGNOSIS — E538 Deficiency of other specified B group vitamins: Secondary | ICD-10-CM | POA: Diagnosis not present

## 2023-05-13 MED ORDER — CYANOCOBALAMIN 1000 MCG/ML IJ SOLN
1000.0000 ug | INTRAMUSCULAR | Status: DC
Start: 2023-05-13 — End: 2024-09-07
  Administered 2023-05-13 – 2023-11-11 (×3): 1000 ug via INTRAMUSCULAR

## 2023-05-13 NOTE — Progress Notes (Signed)
After obtaining consent, and per orders of  Edstrom, PA, injection of B12 given by Tonny Bollman. Patient instructed to remain in clinic for 20 minutes afterwards, and to report any adverse reaction to me immediately.

## 2023-05-14 ENCOUNTER — Encounter: Payer: Self-pay | Admitting: Family Medicine

## 2023-05-14 ENCOUNTER — Ambulatory Visit: Payer: BC Managed Care – PPO | Admitting: Family Medicine

## 2023-05-14 VITALS — BP 144/88 | HR 68 | Resp 18 | Ht 70.8 in | Wt 224.0 lb

## 2023-05-14 DIAGNOSIS — R112 Nausea with vomiting, unspecified: Secondary | ICD-10-CM | POA: Diagnosis not present

## 2023-05-14 DIAGNOSIS — R21 Rash and other nonspecific skin eruption: Secondary | ICD-10-CM

## 2023-05-14 DIAGNOSIS — G43119 Migraine with aura, intractable, without status migrainosus: Secondary | ICD-10-CM | POA: Diagnosis not present

## 2023-05-14 DIAGNOSIS — I10 Essential (primary) hypertension: Secondary | ICD-10-CM

## 2023-05-14 MED ORDER — MONTELUKAST SODIUM 10 MG PO TABS: 10.0000 mg | ORAL_TABLET | Freq: Every day | ORAL | 3 refills | Status: DC

## 2023-05-14 MED ORDER — AMLODIPINE BESYLATE 5 MG PO TABS
5.0000 mg | ORAL_TABLET | Freq: Every day | ORAL | 1 refills | Status: DC
Start: 2023-05-14 — End: 2024-09-07

## 2023-05-14 MED ORDER — ONDANSETRON 8 MG PO TBDP: 8.0000 mg | ORAL_TABLET | Freq: Two times a day (BID) | ORAL | 12 refills | Status: DC

## 2023-05-14 MED ORDER — LOSARTAN POTASSIUM 50 MG PO TABS
ORAL_TABLET | ORAL | 1 refills | Status: DC
Start: 2023-05-14 — End: 2024-09-07

## 2023-05-14 MED ORDER — PROPRANOLOL HCL ER 120 MG PO CP24
120.0000 mg | ORAL_CAPSULE | Freq: Every day | ORAL | 1 refills | Status: DC
Start: 2023-05-14 — End: 2024-09-07

## 2023-05-14 NOTE — Assessment & Plan Note (Signed)
Continue Allegra hives as recommended by allergist.  Also initiating trial of montelukast 10 mg daily.  Patient also has a history of corn containing products which may include losartan and amlodipine.  This is worth investigating at her next follow-up.

## 2023-05-14 NOTE — Progress Notes (Signed)
Established Patient Office Visit  Subjective   Patient ID: [name], female    DOB: [birth year]  Age: 47 y.o. MRN: [id number]  Chief Complaint  Patient presents with   Hypertension    HPI Linda Vargas is a 47 y.o. female presenting today for follow up of hypertension.  She is also complaining of widespread hives.  She is not able to see her allergist for another couple of months, but they did recommend Allegra hives which she takes each morning.  It wears off by the end of the day and makes it difficult to sleep. Hypertension: Pt denies chest pain, SOB, dizziness, edema, syncope, fatigue or heart palpitations. Taking losartan and propranolol, reports excellent compliance with treatment. Denies side effects.  Although amlodipine is still on her medication list, she has not been taking it as she believed that propranolol was replacing the amlodipine.  ROS Negative unless otherwise noted in HPI   Objective:     BP (!) 144/88 (BP Location: Right Arm, Patient Position: Sitting, Cuff Size: Large)   Pulse 68   Resp 18   Ht 5' 10.8" (1.798 m)   Wt 224 lb (101.6 kg)   SpO2 100%   BMI 31.42 kg/m   Physical Exam Constitutional:      General: She is not in acute distress.    Appearance: Normal appearance.  HENT:     Head: Normocephalic and atraumatic.  Cardiovascular:     Rate and Rhythm: Normal rate and regular rhythm.     Heart sounds: No murmur heard.    No friction rub. No gallop.  Pulmonary:     Effort: Pulmonary effort is normal. No respiratory distress.     Breath sounds: No wheezing, rhonchi or rales.  Skin:    General: Skin is warm and dry.  Neurological:     Mental Status: She is alert and oriented to person, place, and time.     Assessment & Plan:  Essential hypertension Assessment & Plan: Patient currently not taking amlodipine.  I recommended continuing losartan 50 mg daily, propranolol 120 mg daily, and restarting amlodipine 5 mg daily.  If  tolerating well after about 2 weeks but blood pressure remaining above goal of 130/80, increase to amlodipine 10 mg daily.  She has tried hydrochlorothiazide in the past but it was discontinued due to hyperkalemia.  We do also have the option of increasing losartan to the maximum dose of 100 mg daily.  Will continue to monitor.  Orders: -     amLODIPine Besylate; Take 1 tablet (5 mg total) by mouth daily. Started per neurology to help with BP and migraine headaches  Dispense: 90 tablet; Refill: 1 -     Losartan Potassium; TAKE 1 TABLET(50 MG) BY MOUTH DAILY  Dispense: 90 tablet; Refill: 1 -     Propranolol HCl ER; Take 1 capsule (120 mg total) by mouth daily.  Dispense: 90 capsule; Refill: 1  Nausea and vomiting, unspecified vomiting type -     Ondansetron; Take 1 tablet (8 mg total) by mouth 2 (two) times daily.  Dispense: 30 tablet; Refill: 12  Rash and nonspecific skin eruption Assessment & Plan: Continue Allegra hives as recommended by allergist.  Also initiating trial of montelukast 10 mg daily.  Patient also has a history of corn containing products which may include losartan and amlodipine.  This is worth investigating at her next follow-up.  Orders: -     Montelukast Sodium; Take 1 tablet (10 mg total)  by mouth at bedtime.  Dispense: 90 tablet; Refill: 3  Intractable migraine with aura without status migrainosus Assessment & Plan: Well-controlled.  Continue propranolol 120 mg daily.  Orders: -     Propranolol HCl ER; Take 1 capsule (120 mg total) by mouth daily.  Dispense: 90 capsule; Refill: 1    Return in about 8 weeks (around 07/09/2023) for follow-up for HTN.    Melida Quitter, PA

## 2023-05-14 NOTE — Assessment & Plan Note (Signed)
Patient currently not taking amlodipine.  I recommended continuing losartan 50 mg daily, propranolol 120 mg daily, and restarting amlodipine 5 mg daily.  If tolerating well after about 2 weeks but blood pressure remaining above goal of 130/80, increase to amlodipine 10 mg daily.  She has tried hydrochlorothiazide in the past but it was discontinued due to hyperkalemia.  We do also have the option of increasing losartan to the maximum dose of 100 mg daily.  Will continue to monitor.

## 2023-05-14 NOTE — Assessment & Plan Note (Signed)
Well-controlled.  Continue propranolol 120 mg daily.

## 2023-05-15 ENCOUNTER — Other Ambulatory Visit: Payer: BC Managed Care – PPO

## 2023-06-16 ENCOUNTER — Ambulatory Visit: Payer: BC Managed Care – PPO

## 2023-06-29 ENCOUNTER — Ambulatory Visit (INDEPENDENT_AMBULATORY_CARE_PROVIDER_SITE_OTHER): Payer: BC Managed Care – PPO | Admitting: Family Medicine

## 2023-06-29 DIAGNOSIS — E538 Deficiency of other specified B group vitamins: Secondary | ICD-10-CM

## 2023-06-29 NOTE — Progress Notes (Signed)
After obtaining consent, and per orders of [facility], injection of B12 given by Tonny Bollman. Patient instructed to remain in clinic for 20 minutes afterwards, and to report any adverse reaction to me immediately.

## 2023-07-01 ENCOUNTER — Encounter: Payer: Self-pay | Admitting: Pharmacist

## 2023-07-09 ENCOUNTER — Ambulatory Visit: Payer: BC Managed Care – PPO | Admitting: Family Medicine

## 2023-07-19 ENCOUNTER — Telehealth: Payer: BC Managed Care – PPO | Admitting: Physician Assistant

## 2023-07-19 DIAGNOSIS — B9689 Other specified bacterial agents as the cause of diseases classified elsewhere: Secondary | ICD-10-CM | POA: Diagnosis not present

## 2023-07-19 DIAGNOSIS — J019 Acute sinusitis, unspecified: Secondary | ICD-10-CM

## 2023-07-19 MED ORDER — AMOXICILLIN-POT CLAVULANATE 875-125 MG PO TABS: 1.0000 | ORAL_TABLET | Freq: Two times a day (BID) | ORAL | 0 refills | Status: DC

## 2023-07-19 NOTE — Progress Notes (Signed)
Virtual Visit Consent   [name], you are scheduled for a virtual visit with a [facility] provider today. Just as with appointments in the office, your consent must be obtained to participate. Your consent will be active for this visit and any virtual visit you may have with one of our providers in the next 365 days. If you have a MyChart account, a copy of this consent can be sent to you electronically.  As this is a virtual visit, video technology does not allow for your provider to perform a traditional examination. This may limit your provider's ability to fully assess your condition. If your provider identifies any concerns that need to be evaluated in person or the need to arrange testing (such as labs, EKG, etc.), we will make arrangements to do so. Although advances in technology are sophisticated, we cannot ensure that it will always work on either your end or our end. If the connection with a video visit is poor, the visit may have to be switched to a telephone visit. With either a video or telephone visit, we are not always able to ensure that we have a secure connection.  By engaging in this virtual visit, you consent to the provision of healthcare and authorize for your insurance to be billed (if applicable) for the services provided during this visit. Depending on your insurance coverage, you may receive a charge related to this service.  I need to obtain your verbal consent now. Are you willing to proceed with your visit today? [name] has provided verbal consent on 07/19/2023 for a virtual visit (video or telephone). Margaretann Loveless, PA-C  Date: 07/19/2023 9:03 AM  Virtual Visit via Video Note   I, Margaretann Loveless, connected with  Linda Vargas  ([id number], 31-Jan-1976) on 07/19/23 at  9:00 AM EDT by a video-enabled telemedicine application and verified that I am speaking with the correct person using two identifiers.  Location: Patient: Virtual  Visit Location Patient: Home Provider: Virtual Visit Location Provider: Home Office   I discussed the limitations of evaluation and management by telemedicine and the availability of in person appointments. The patient expressed understanding and agreed to proceed.    History of Present Illness: Linda Vargas is a 47 y.o. who identifies as a female who was assigned female at birth, and is being seen today for sinus infection.  HPI: Sinusitis This is a new problem. The current episode started in the past 7 days. The problem has been gradually worsening since onset. Maximum temperature: 99.1. The fever has been present for Less than 1 day. The pain is moderate. Associated symptoms include congestion, coughing (mild), headaches, sinus pressure and a sore throat (scratchy, initially, now improved). Pertinent negatives include no chills, ear pain or hoarse voice. (Rhinorrhea, post nasal drainage) Treatments tried: coricidin hbp, mucinex, azelastine nasal, allergy medications. The treatment provided no relief.    Started allergy injections in the last 2 weeks.  Problems:  Patient Active Problem List   Diagnosis Date Noted   Swelling of ankle joint 09/26/2022   Chronic allergic rhinitis 07/06/2022   Wheezing 05/26/2022   Menorrhagia with irregular cycle 08/28/2021   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   Body mass index (BMI) of 33.0-33.9 in adult 08/28/2021   Pituitary lesion (HCC) 07/24/2021   White matter disease 07/24/2021   Chronic pansinusitis 07/24/2021   Nausea and vomiting 07/01/2021   COVID-19 long hauler 06/19/2021   Rash and nonspecific skin eruption 04/07/2021  Primary insomnia 01/11/2020   Vitamin D deficiency 04/11/2019   Vitamin B12 deficiency 04/03/2019   Fever and chills 10/20/2018   Fatigue 10/20/2018   Essential hypertension 04/06/2018   Generalized anxiety disorder 04/06/2018   Depression, major, single episode, moderate (HCC) 01/21/2017   Intractable  migraine with aura without status migrainosus 12/12/2016   TIA (transient ischemic attack) 10/12/2015   Long-term use of Plaquenil 07/21/2014   Fibromyalgia 11/12/2012   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 , Disp: , Rfl:    albuterol (VENTOLIN HFA) 108 (90 Base) MCG/ACT inhaler, Inhale 2 puffs into the lungs every 6 (six) hours as needed for wheezing or shortness of breath., Disp: 1 each, Rfl: 2   amLODipine (NORVASC) 5 MG tablet, Take 1 tablet (5 mg total) by mouth daily. Started per neurology to help with BP and migraine headaches, Disp: 90 tablet, Rfl: 1   Azelastine HCl 0.15 % SOLN, INL 2 PFS IEN QD, Disp: , Rfl: 12   butalbital-acetaminophen-caffeine (FIORICET) 50-325-40 MG tablet, butalbital-acetaminophen-caffeine 50 mg-325 mg-40 mg tablet  TAKE 1 TABLET BY MOUTH EVERY 4 HOURS FOR UP TO 10 DAYS AS NEEDED FOR PAIN, Disp: , Rfl:    clobetasol cream (TEMOVATE) 0.05 %, Apply 1 application topically 2 (two) times daily., Disp: 60 g, Rfl: 1   cyanocobalamin (VITAMIN B12) 1000 MCG/ML injection, Inject into the muscle., Disp: , Rfl:    DULoxetine (CYMBALTA) 30 MG capsule, Take 1 capsule (30 mg total) by mouth 2 (two) times daily., Disp: 60 capsule, Rfl: 3   EPIPEN 2-PAK 0.3 MG/0.3ML SOAJ injection, INJECT INTRAMUSCULARLY AS DIRECTED FOR ANAPHYLATIC REACTIONS, Disp: , Rfl: 0   ergocalciferol (DRISDOL) 1.25 MG (50000 UT) capsule, Take 1  capsule (50,000 Units total) by mouth 2 (two) times a week., Disp: 8 capsule, Rfl: 5   fluticasone (FLONASE) 50 MCG/ACT nasal spray, Place 2 sprays into both nostrils daily., Disp: 16 g, Rfl: 0   hydrochlorothiazide (HYDRODIURIL) 25 MG tablet, , Disp: , Rfl:    hydroxychloroquine (PLAQUENIL) 200 MG tablet, Take 200 mg by mouth 2 (two) times daily., Disp: , Rfl:    hydroxychloroquine (PLAQUENIL) 200 MG tablet, Take by mouth., Disp: , Rfl:    hydrOXYzine (ATARAX) 25 MG tablet, TAKE 1 TABLET(25 MG) BY MOUTH EVERY 8 HOURS AS NEEDED FOR ANXIETY, Disp: 90 tablet, Rfl: 0   losartan (COZAAR) 50 MG tablet, TAKE 1 TABLET(50 MG) BY MOUTH DAILY, Disp: 90 tablet, Rfl: 1   montelukast (SINGULAIR) 10 MG tablet, Take 1 tablet (10 mg total) by mouth at bedtime., Disp: 90 tablet, Rfl: 3   ondansetron (ZOFRAN-ODT) 8 MG disintegrating tablet, Take 1 tablet (8 mg total) by mouth 2 (two) times daily., Disp: 30 tablet, Rfl: 12   propranolol ER (INDERAL LA) 120 MG 24 hr capsule, Take 1 capsule (120 mg total) by mouth daily., Disp: 90 capsule, Rfl: 1  Current Facility-Administered Medications:    cyanocobalamin (VITAMIN B12) injection 1,000  mcg, 1,000 mcg, Intramuscular, Q30 days, Saralyn Pilar A, PA, 1,000 mcg at 06/29/23 1546  Observations/Objective: Patient is well-developed, well-nourished in no acute distress.  Resting comfortably at home.  Head is normocephalic, atraumatic.  No labored breathing.  Speech is clear and coherent with Linda content.  Patient is alert and oriented at baseline.    Assessment and Plan: 1. Acute bacterial sinusitis - amoxicillin-clavulanate (AUGMENTIN) 875-125 MG tablet; Take 1 tablet by mouth 2 (two) times daily.  Dispense: 14 tablet; Refill: 0  - Worsening symptoms that have not responded to OTC medications.  - Will give Augmentin - Continue allergy medications.  - Steam and humidifier can help - Stay well hydrated and get plenty of rest.  - Seek in person evaluation if  no symptom improvement or if symptoms worsen   Follow Up Instructions: I discussed the assessment and treatment plan with the patient. The patient was provided an opportunity to ask questions and all were answered. The patient agreed with the plan and demonstrated an understanding of the instructions.  A copy of instructions were sent to the patient via MyChart unless otherwise noted below.    The patient was advised to call back or seek an in-person evaluation if the symptoms worsen or if the condition fails to improve as anticipated.   Margaretann Loveless, PA-C

## 2023-07-19 NOTE — Patient Instructions (Signed)
Lutricia Horsfall, thank you for joining Margaretann Loveless, PA-C for today's virtual visit.  While this provider is not your primary care provider (PCP), if your PCP is located in our provider database this encounter information will be shared with them immediately following your visit.   A [facility] MyChart account gives you access to today's visit and all your visits, tests, and labs performed at [facility] " click here if you don't have a [facility] MyChart account or go to [email or web address]  Consent: (Patient) Lutricia Horsfall provided verbal consent for this virtual visit at the beginning of the encounter.  Current Medications:  Current Outpatient Medications:    amoxicillin-clavulanate (AUGMENTIN) 875-125 MG tablet, Take 1 tablet by mouth 2 (two) times daily., Disp: 14 tablet, Rfl: 0   acetaminophen (TYLENOL) 500 MG tablet, Take 1,000 mg by mouth every 8 (eight) hours as needed for headache. , Disp: , Rfl:    albuterol (VENTOLIN HFA) 108 (90 Base) MCG/ACT inhaler, Inhale 2 puffs into the lungs every 6 (six) hours as needed for wheezing or shortness of breath., Disp: 1 each, Rfl: 2   amLODipine (NORVASC) 5 MG tablet, Take 1 tablet (5 mg total) by mouth daily. Started per neurology to help with BP and migraine headaches, Disp: 90 tablet, Rfl: 1   Azelastine HCl 0.15 % SOLN, INL 2 PFS IEN QD, Disp: , Rfl: 12   butalbital-acetaminophen-caffeine (FIORICET) 50-325-40 MG tablet, butalbital-acetaminophen-caffeine 50 mg-325 mg-40 mg tablet  TAKE 1 TABLET BY MOUTH EVERY 4 HOURS FOR UP TO 10 DAYS AS NEEDED FOR PAIN, Disp: , Rfl:    clobetasol cream (TEMOVATE) 0.05 %, Apply 1 application topically 2 (two) times daily., Disp: 60 g, Rfl: 1   cyanocobalamin (VITAMIN B12) 1000 MCG/ML injection, Inject into the muscle., Disp: , Rfl:    DULoxetine (CYMBALTA) 30 MG capsule, Take 1 capsule (30 mg total) by mouth 2 (two) times daily., Disp: 60 capsule, Rfl: 3   EPIPEN 2-PAK 0.3  MG/0.3ML SOAJ injection, INJECT INTRAMUSCULARLY AS DIRECTED FOR ANAPHYLATIC REACTIONS, Disp: , Rfl: 0   ergocalciferol (DRISDOL) 1.25 MG (50000 UT) capsule, Take 1 capsule (50,000 Units total) by mouth 2 (two) times a week., Disp: 8 capsule, Rfl: 5   fluticasone (FLONASE) 50 MCG/ACT nasal spray, Place 2 sprays into both nostrils daily., Disp: 16 g, Rfl: 0   hydrochlorothiazide (HYDRODIURIL) 25 MG tablet, , Disp: , Rfl:    hydroxychloroquine (PLAQUENIL) 200 MG tablet, Take 200 mg by mouth 2 (two) times daily., Disp: , Rfl:    hydroxychloroquine (PLAQUENIL) 200 MG tablet, Take by mouth., Disp: , Rfl:    hydrOXYzine (ATARAX) 25 MG tablet, TAKE 1 TABLET(25 MG) BY MOUTH EVERY 8 HOURS AS NEEDED FOR ANXIETY, Disp: 90 tablet, Rfl: 0   losartan (COZAAR) 50 MG tablet, TAKE 1 TABLET(50 MG) BY MOUTH DAILY, Disp: 90 tablet, Rfl: 1   montelukast (SINGULAIR) 10 MG tablet, Take 1 tablet (10 mg total) by mouth at bedtime., Disp: 90 tablet, Rfl: 3   ondansetron (ZOFRAN-ODT) 8 MG disintegrating tablet, Take 1 tablet (8 mg total) by mouth 2 (two) times daily., Disp: 30 tablet, Rfl: 12   propranolol ER (INDERAL LA) 120 MG 24 hr capsule, Take 1 capsule (120 mg total) by mouth daily., Disp: 90 capsule, Rfl: 1  Current Facility-Administered Medications:    cyanocobalamin (VITAMIN B12) injection 1,000 mcg, 1,000 mcg, Intramuscular, Q30 days, Saralyn Pilar A, PA, 1,000 mcg at 06/29/23 1546   Medications ordered in this encounter:  Meds  ordered this encounter  Medications   amoxicillin-clavulanate (AUGMENTIN) 875-125 MG tablet    Sig: Take 1 tablet by mouth 2 (two) times daily.    Dispense:  14 tablet    Refill:  0    Order Specific Question:   Supervising Provider    Answer:   Merrilee Jansky [id number]     *If you need refills on other medications prior to your next appointment, please contact your pharmacy*  Follow-Up: Call back or seek an in-person evaluation if the symptoms worsen or if the condition fails  to improve as anticipated.  [facility] [phone]  Other Instructions Sinus Infection, Adult A sinus infection, also called sinusitis, is inflammation of your sinuses. Sinuses are hollow spaces in the bones around your face. Your sinuses are located: Around your eyes. In the middle of your forehead. Behind your nose. In your cheekbones. Mucus normally drains out of your sinuses. When your nasal tissues become inflamed or swollen, mucus can become trapped or blocked. This allows bacteria, viruses, and fungi to grow, which leads to infection. Most infections of the sinuses are caused by a virus. A sinus infection can develop quickly. It can last for up to 4 weeks (acute) or for more than 12 weeks (chronic). A sinus infection often develops after a cold. What are the causes? This condition is caused by anything that creates swelling in the sinuses or stops mucus from draining. This includes: Allergies. Asthma. Infection from bacteria or viruses. Deformities or blockages in your nose or sinuses. Abnormal growths in the nose (nasal polyps). Pollutants, such as chemicals or irritants in the air. Infection from fungi. This is rare. What increases the risk? You are more likely to develop this condition if you: Have a weak body defense system (immune system). Do a lot of swimming or diving. Overuse nasal sprays. Smoke. What are the signs or symptoms? The main symptoms of this condition are pain and a feeling of pressure around the affected sinuses. Other symptoms include: Stuffy nose or congestion that makes it difficult to breathe through your nose. Thick yellow or greenish drainage from your nose. Tenderness, swelling, and warmth over the affected sinuses. A cough that may get worse at night. Decreased sense of smell and taste. Extra mucus that collects in the throat or the back of the nose (postnasal drip) causing a sore throat or bad breath. Tiredness  (fatigue). Fever. How is this diagnosed? This condition is diagnosed based on: Your symptoms. Your medical history. A physical exam. Tests to find out if your condition is acute or chronic. This may include: Checking your nose for nasal polyps. Viewing your sinuses using a device that has a light (endoscope). Testing for allergies or bacteria. Imaging tests, such as an MRI or CT scan. In rare cases, a bone biopsy may be done to rule out more serious types of fungal sinus disease. How is this treated? Treatment for a sinus infection depends on the cause and whether your condition is chronic or acute. If caused by a virus, your symptoms should go away on their own within 10 days. You may be given medicines to relieve symptoms. They include: Medicines that shrink swollen nasal passages (decongestants). A spray that eases inflammation of the nostrils (topical intranasal corticosteroids). Rinses that help get rid of thick mucus in your nose (nasal saline washes). Medicines that treat allergies (antihistamines). Over-the-counter pain relievers. If caused by bacteria, your health care provider may recommend waiting to see if your symptoms  improve. Most bacterial infections will get better without antibiotic medicine. You may be given antibiotics if you have: A severe infection. A weak immune system. If caused by narrow nasal passages or nasal polyps, surgery may be needed. Follow these instructions at home: Medicines Take, use, or apply over-the-counter and prescription medicines only as told by your health care provider. These may include nasal sprays. If you were prescribed an antibiotic medicine, take it as told by your health care provider. Do not stop taking the antibiotic even if you start to feel better. Hydrate and humidify  Drink enough fluid to keep your urine pale yellow. Staying hydrated will help to thin your mucus. Use a cool mist humidifier to keep the humidity level in your  home above 50%. Inhale steam for 10-15 minutes, 3-4 times a day, or as told by your health care provider. You can do this in the bathroom while a hot shower is running. Limit your exposure to cool or dry air. Rest Rest as much as possible. Sleep with your head raised (elevated). Make sure you get enough sleep each night. General instructions  Apply a warm, moist washcloth to your face 3-4 times a day or as told by your health care provider. This will help with discomfort. Use nasal saline washes as often as told by your health care provider. Wash your hands often with soap and water to reduce your exposure to germs. If soap and water are not available, use hand sanitizer. Do not smoke. Avoid being around people who are smoking (secondhand smoke). Keep all follow-up visits. This is important. Contact a health care provider if: You have a fever. Your symptoms get worse. Your symptoms do not improve within 10 days. Get help right away if: You have a severe headache. You have persistent vomiting. You have severe pain or swelling around your face or eyes. You have vision problems. You develop confusion. Your neck is stiff. You have trouble breathing. These symptoms may be an emergency. Get help right away. Call 911. Do not wait to see if the symptoms will go away. Do not drive yourself to the hospital. Summary A sinus infection is soreness and inflammation of your sinuses. Sinuses are hollow spaces in the bones around your face. This condition is caused by nasal tissues that become inflamed or swollen. The swelling traps or blocks the flow of mucus. This allows bacteria, viruses, and fungi to grow, which leads to infection. If you were prescribed an antibiotic medicine, take it as told by your health care provider. Do not stop taking the antibiotic even if you start to feel better. Keep all follow-up visits. This is important. This information is not intended to replace advice given to  you by your health care provider. Make sure you discuss any questions you have with your health care provider. Document Revised: 09/03/2021 Document Reviewed: 09/03/2021 Elsevier Patient Education  2024 [employer]    If you have been instructed to have an in-person evaluation today at a local Urgent Care facility, please use the link below. It will take you to a list of all of our available [facility], including address, phone number and hours of operation. Please do not delay care.  [facility]  If you or a family member do not have a primary care provider, use the link below to schedule a visit and establish care. When you choose a  primary care physician or advanced practice provider, you gain a long-term partner  in health. Find a Primary Care Provider  Learn more about [facility] in-office and virtual care options: [facility] Get Care Now

## 2023-07-30 ENCOUNTER — Ambulatory Visit: Payer: BC Managed Care – PPO

## 2023-07-30 ENCOUNTER — Ambulatory Visit: Payer: BC Managed Care – PPO | Admitting: Family Medicine

## 2023-08-02 ENCOUNTER — Encounter: Payer: Self-pay | Admitting: Family Medicine

## 2023-09-28 ENCOUNTER — Ambulatory Visit: Payer: BC Managed Care – PPO | Admitting: Family Medicine

## 2023-09-28 ENCOUNTER — Encounter: Payer: Self-pay | Admitting: Family Medicine

## 2023-10-05 ENCOUNTER — Ambulatory Visit: Payer: BC Managed Care – PPO

## 2023-10-12 ENCOUNTER — Ambulatory Visit (INDEPENDENT_AMBULATORY_CARE_PROVIDER_SITE_OTHER): Payer: BC Managed Care – PPO | Admitting: Family Medicine

## 2023-10-12 VITALS — BP 150/83 | HR 68 | Temp 98.0°F | Ht 70.0 in | Wt 224.0 lb

## 2023-10-12 DIAGNOSIS — E538 Deficiency of other specified B group vitamins: Secondary | ICD-10-CM

## 2023-10-12 MED ORDER — CYANOCOBALAMIN 1000 MCG/ML IJ SOLN
1000.0000 ug | Freq: Once | INTRAMUSCULAR | Status: AC
Start: 2023-10-12 — End: 2023-10-12
  Administered 2023-10-12: 1000 ug via INTRAMUSCULAR

## 2023-10-12 NOTE — Progress Notes (Signed)
Patient in clinic today for vitamin b-12 injection. Patient tolerated injection well.

## 2023-10-27 ENCOUNTER — Other Ambulatory Visit: Payer: Self-pay | Admitting: Obstetrics and Gynecology

## 2023-10-27 DIAGNOSIS — R928 Other abnormal and inconclusive findings on diagnostic imaging of breast: Secondary | ICD-10-CM

## 2023-11-11 ENCOUNTER — Ambulatory Visit: Payer: 59 | Admitting: Family Medicine

## 2023-11-11 ENCOUNTER — Encounter: Payer: Self-pay | Admitting: Family Medicine

## 2023-11-11 VITALS — BP 130/80 | HR 72 | Ht 70.0 in | Wt 230.0 lb

## 2023-11-11 DIAGNOSIS — I10 Essential (primary) hypertension: Secondary | ICD-10-CM | POA: Diagnosis not present

## 2023-11-11 DIAGNOSIS — E538 Deficiency of other specified B group vitamins: Secondary | ICD-10-CM | POA: Diagnosis not present

## 2023-11-11 DIAGNOSIS — Z6833 Body mass index (BMI) 33.0-33.9, adult: Secondary | ICD-10-CM

## 2023-11-11 DIAGNOSIS — E559 Vitamin D deficiency, unspecified: Secondary | ICD-10-CM | POA: Diagnosis not present

## 2023-11-11 NOTE — Patient Instructions (Signed)
Continue to monitor your blood pressure at home.  I typically recommend to check it 1-2 hours after taking your blood pressure medicine to ensure that blood pressure is being adequately controlled.  Our goal is for the top number to be less than 140 majority of the time, and for the bottom number to be less than 90 majority.  You can also bring your blood pressure monitor to your next appointment to see how it compares to the machine in our office.  Once you have a chance to check with your rheumatologist, just let us know if you decide to get the tetanus booster!  We do have it in our office, you so you can get it at your next office visit or as a nurse visit.

## 2023-11-11 NOTE — Assessment & Plan Note (Signed)
BP goal <140/90.  Stable, at home and rechecked manually.  Continue losartan 50 mg daily, propranolol 120 mg daily, and restarting amlodipine 5 mg daily.  Will continue to monitor.

## 2023-11-11 NOTE — Progress Notes (Unsigned)
Established Patient Office Visit  Subjective   Patient ID: Linda Vargas, female    DOB: 1976/10/10  Age: 48 y.o. MRN: [id number]  Chief Complaint  Patient presents with   Follow-up    HPI Linda Vargas is a 48 y.o. female presenting today for follow up of hypertension and for her B12 injection. Pt denies chest pain, SOB, dizziness, edema, syncope, fatigue or heart palpitations. Taking amlodipine, losartan, and propranolol, reports excellent compliance with treatment. Denies side effects.  She had an appointment with OB/GYN earlier today for annual and Pap smear, her blood pressure there when checked manually was 126/72.  She states at home blood pressure can vary.  Outpatient Medications Prior to Visit  Medication Sig   acetaminophen (TYLENOL) 500 MG tablet Take 1,000 mg by mouth every 8 (eight) hours as needed for headache.    albuterol (VENTOLIN HFA) 108 (90 Base) MCG/ACT inhaler Inhale 2 puffs into the lungs every 6 (six) hours as needed for wheezing or shortness of breath.   amLODipine (NORVASC) 5 MG tablet Take 1 tablet (5 mg total) by mouth daily. Started per neurology to help with BP and migraine headaches   Azelastine HCl 0.15 % SOLN INL 2 PFS IEN QD   butalbital-acetaminophen-caffeine (FIORICET) 50-325-40 MG tablet butalbital-acetaminophen-caffeine 50 mg-325 mg-40 mg tablet  TAKE 1 TABLET BY MOUTH EVERY 4 HOURS FOR UP TO 10 DAYS AS NEEDED FOR PAIN   clobetasol cream (TEMOVATE) 0.05 % Apply 1 application topically 2 (two) times daily.   cyanocobalamin (VITAMIN B12) 1000 MCG/ML injection Inject into the muscle.   DULoxetine (CYMBALTA) 30 MG capsule Take 1 capsule (30 mg total) by mouth 2 (two) times daily.   EPIPEN 2-PAK 0.3 MG/0.3ML SOAJ injection INJECT INTRAMUSCULARLY AS DIRECTED FOR ANAPHYLATIC REACTIONS   ergocalciferol (DRISDOL) 1.25 MG (50000 UT) capsule Take 1 capsule (50,000 Units total) by mouth 2 (two) times a week.   fluticasone (FLONASE) 50 MCG/ACT nasal  spray Place 2 sprays into both nostrils daily.   hydroxychloroquine (PLAQUENIL) 200 MG tablet Take 200 mg by mouth 2 (two) times daily.   hydroxychloroquine (PLAQUENIL) 200 MG tablet Take by mouth.   hydrOXYzine (ATARAX) 25 MG tablet TAKE 1 TABLET(25 MG) BY MOUTH EVERY 8 HOURS AS NEEDED FOR ANXIETY   losartan (COZAAR) 50 MG tablet TAKE 1 TABLET(50 MG) BY MOUTH DAILY   montelukast (SINGULAIR) 10 MG tablet Take 1 tablet (10 mg total) by mouth at bedtime.   ondansetron (ZOFRAN-ODT) 8 MG disintegrating tablet Take 1 tablet (8 mg total) by mouth 2 (two) times daily.   propranolol ER (INDERAL LA) 120 MG 24 hr capsule Take 1 capsule (120 mg total) by mouth daily.   [DISCONTINUED] amoxicillin-clavulanate (AUGMENTIN) 875-125 MG tablet Take 1 tablet by mouth 2 (two) times daily.   [DISCONTINUED] hydrochlorothiazide (HYDRODIURIL) 25 MG tablet    Facility-Administered Medications Prior to Visit  Medication Dose Route Frequency Provider   cyanocobalamin (VITAMIN B12) injection 1,000 mcg  1,000 mcg Intramuscular Q30 days Saralyn Pilar A, PA    ROS Negative unless otherwise noted in HPI   Objective:     BP 130/80   Pulse 72   Ht 5\' 10"  (1.778 m)   Wt 230 lb (104.3 kg)   SpO2 100%   BMI 33.00 kg/m   Physical Exam Constitutional:      General: She is not in acute distress.    Appearance: Normal appearance.  HENT:     Head: Normocephalic and atraumatic.  Cardiovascular:  Rate and Rhythm: Normal rate and regular rhythm.     Heart sounds: No murmur heard.    No friction rub. No gallop.  Pulmonary:     Effort: Pulmonary effort is normal. No respiratory distress.     Breath sounds: No wheezing, rhonchi or rales.  Skin:    General: Skin is warm and dry.  Neurological:     Mental Status: She is alert and oriented to person, place, and time.      Assessment & Plan:  Essential hypertension Assessment & Plan: BP goal <140/90.  Stable, at home and rechecked manually.  Continue losartan  50 mg daily, propranolol 120 mg daily, and restarting amlodipine 5 mg daily.  Will continue to monitor.  Orders: -     CBC with Differential/Platelet; Future -     Comprehensive metabolic panel; Future  Vitamin B12 deficiency -     Vitamin B12; Future  Vitamin D deficiency -     VITAMIN D 25 Hydroxy (Vit-D Deficiency, Fractures); Future  Body mass index (BMI) of 33.0-33.9 in adult -     CBC with Differential/Platelet; Future -     Comprehensive metabolic panel; Future -     Hemoglobin A1c; Future -     Lipid panel; Future -     TSH Rfx on Abnormal to Free T4; Future -     VITAMIN D 25 Hydroxy (Vit-D Deficiency, Fractures); Future -     Vitamin B12; Future  Receiving B12 injection today.  Return in about 4 months (around 03/10/2024) for annual physical, fasting labs 1 week before (including B12).    Melida Quitter, PA

## 2023-11-12 ENCOUNTER — Ambulatory Visit: Payer: Self-pay

## 2023-11-12 ENCOUNTER — Ambulatory Visit
Admission: RE | Admit: 2023-11-12 | Discharge: 2023-11-12 | Disposition: A | Discharge status: Home / Self Care | Payer: Self-pay | Source: Ambulatory Visit | Attending: Obstetrics and Gynecology | Admitting: Obstetrics and Gynecology

## 2023-11-12 DIAGNOSIS — R928 Other abnormal and inconclusive findings on diagnostic imaging of breast: Secondary | ICD-10-CM

## 2023-11-19 ENCOUNTER — Ambulatory Visit: Payer: Self-pay | Admitting: Family Medicine

## 2023-11-19 ENCOUNTER — Encounter: Payer: Self-pay | Admitting: Family Medicine

## 2023-12-06 ENCOUNTER — Other Ambulatory Visit: Payer: Self-pay | Admitting: Nurse Practitioner

## 2023-12-06 DIAGNOSIS — F411 Generalized anxiety disorder: Secondary | ICD-10-CM

## 2023-12-07 NOTE — Telephone Encounter (Signed)
 Linda Vargas. You are listed as her primary care. Herbert Seta

## 2023-12-16 ENCOUNTER — Ambulatory Visit: Payer: 59

## 2023-12-22 ENCOUNTER — Ambulatory Visit

## 2023-12-25 ENCOUNTER — Telehealth: Admitting: Family Medicine

## 2023-12-25 DIAGNOSIS — R6889 Other general symptoms and signs: Secondary | ICD-10-CM | POA: Diagnosis not present

## 2023-12-25 MED ORDER — OSELTAMIVIR PHOSPHATE 75 MG PO CAPS: 75.0000 mg | ORAL_CAPSULE | Freq: Two times a day (BID) | ORAL | 0 refills | Status: AC

## 2023-12-25 NOTE — Patient Instructions (Signed)

## 2023-12-25 NOTE — Progress Notes (Signed)
 Virtual Visit Consent   [name], you are scheduled for a virtual visit with a [facility] provider today. Just as with appointments in the office, your consent must be obtained to participate. Your consent will be active for this visit and any virtual visit you may have with one of our providers in the next 365 days. If you have a MyChart account, a copy of this consent can be sent to you electronically.  As this is a virtual visit, video technology does not allow for your provider to perform a traditional examination. This may limit your provider's ability to fully assess your condition. If your provider identifies any concerns that need to be evaluated in person or the need to arrange testing (such as labs, EKG, etc.), we will make arrangements to do so. Although advances in technology are sophisticated, we cannot ensure that it will always work on either your end or our end. If the connection with a video visit is poor, the visit may have to be switched to a telephone visit. With either a video or telephone visit, we are not always able to ensure that we have a secure connection.  By engaging in this virtual visit, you consent to the provision of healthcare and authorize for your insurance to be billed (if applicable) for the services provided during this visit. Depending on your insurance coverage, you may receive a charge related to this service.  I need to obtain your verbal consent now. Are you willing to proceed with your visit today? Linda Vargas has provided verbal consent on 12/25/2023 for a virtual visit (video or telephone). Georgana Curio, FNP  Date: 12/25/2023 6:30 PM   Virtual Visit via Video Note   I, Georgana Curio, connected with  Linda Vargas  ([id number], 12-18-1975) on 12/25/23 at  6:30 PM EDT by a video-enabled telemedicine application and verified that I am speaking with the correct person using two identifiers.  Location: Patient: Virtual Visit Location  Patient: Home Provider: Virtual Visit Location Provider: Home Office   I discussed the limitations of evaluation and management by telemedicine and the availability of in person appointments. The patient expressed understanding and agreed to proceed.    History of Present Illness: Linda Vargas is a 47 y.o. 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 Body aches, chills, headache. Sx started today. She says she took tamiflu without problems last year.   HPI: HPI  Problems:  Patient Active Problem List   Diagnosis Date Noted   Chronic allergic rhinitis 07/06/2022   Menorrhagia with irregular cycle 08/28/2021   Body mass index (BMI) of 33.0-33.9 in adult 08/28/2021   White matter disease 07/24/2021   Pituitary microadenoma (HCC) 07/24/2021   COVID-19 long hauler 06/19/2021   Rash and nonspecific skin eruption 04/07/2021   Primary insomnia 01/11/2020   Vitamin D deficiency 04/11/2019   Vitamin B12 deficiency 04/03/2019   Essential hypertension 04/06/2018   Generalized anxiety disorder 04/06/2018   Depression, major, single episode, moderate (HCC) 01/21/2017   Intractable migraine with aura without status migrainosus 12/12/2016   Long-term use of Plaquenil 07/21/2014   Fibromyalgia 11/12/2012   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 , Disp: , Rfl:    albuterol (VENTOLIN HFA) 108 (90 Base) MCG/ACT inhaler, Inhale 2 puffs into the lungs every 6 (six) hours as needed  for wheezing or shortness of breath., Disp: 1 each, Rfl: 2   amLODipine (NORVASC) 5 MG tablet, Take 1 tablet (5 mg total) by mouth daily. Started per neurology to help with BP and migraine headaches, Disp: 90 tablet, Rfl: 1   Azelastine HCl 0.15 % SOLN, INL 2 PFS IEN QD, Disp: , Rfl: 12   butalbital-acetaminophen-caffeine (FIORICET) 50-325-40 MG tablet, butalbital-acetaminophen-caffeine 50 mg-325 mg-40 mg tablet  TAKE 1 TABLET BY MOUTH EVERY 4 HOURS FOR UP TO 10 DAYS AS NEEDED FOR PAIN, Disp: , Rfl:    clobetasol cream (TEMOVATE) 0.05 %, Apply 1 application topically 2 (two) times daily., Disp: 60 g, Rfl: 1   cyanocobalamin (VITAMIN B12) 1000 MCG/ML injection, Inject into the muscle., Disp: , Rfl:    DULoxetine (CYMBALTA) 30 MG capsule, Take 1 capsule (30 mg total) by mouth 2 (two) times daily., Disp: 60 capsule, Rfl: 3   EPIPEN 2-PAK 0.3 MG/0.3ML SOAJ injection, INJECT INTRAMUSCULARLY AS DIRECTED FOR ANAPHYLATIC REACTIONS, Disp: , Rfl: 0   ergocalciferol (DRISDOL) 1.25 MG (50000 UT) capsule, Take 1 capsule (50,000 Units total) by mouth 2 (two) times a week., Disp: 8 capsule, Rfl: 5   fluticasone (FLONASE) 50 MCG/ACT nasal spray, Place 2 sprays into both nostrils daily., Disp: 16 g, Rfl: 0   hydroxychloroquine (PLAQUENIL) 200 MG tablet, Take 200 mg by mouth 2 (two) times daily., Disp: , Rfl:    hydroxychloroquine (PLAQUENIL) 200 MG tablet, Take by mouth., Disp: , Rfl:    hydrOXYzine (ATARAX) 25 MG tablet, TAKE 1 TABLET(25 MG) BY MOUTH EVERY 8 HOURS AS NEEDED FOR ANXIETY, Disp: 90 tablet, Rfl: 0   losartan (COZAAR) 50 MG tablet, TAKE 1 TABLET(50 MG) BY MOUTH DAILY, Disp: 90 tablet, Rfl: 1   montelukast (SINGULAIR) 10 MG tablet, Take 1 tablet (10 mg total) by mouth at bedtime., Disp: 90 tablet, Rfl: 3   ondansetron (ZOFRAN-ODT) 8 MG disintegrating tablet, Take 1 tablet (8 mg total) by mouth 2 (two) times daily., Disp: 30 tablet, Rfl: 12   propranolol ER (INDERAL LA) 120 MG 24 hr capsule, Take 1 capsule  (120 mg total) by mouth daily., Disp: 90 capsule, Rfl: 1  Current Facility-Administered Medications:    cyanocobalamin (VITAMIN B12) injection 1,000 mcg, 1,000 mcg, Intramuscular, Q30 days, Saralyn Pilar A, PA, 1,000 mcg at 11/11/23 1604  Observations/Objective: Patient is well-developed, well-nourished in no acute distress.  Resting comfortably  at home.  Head is normocephalic, atraumatic.  No labored breathing.  Speech is clear and coherent with logical content.  Patient is alert and oriented at baseline.    Assessment and Plan: 1. Influenza-like symptoms (Primary)  Increase fluids, tylenol or ibuprofen as directed, UC as needed.   Follow Up Instructions: I discussed the assessment and treatment plan with the patient. The patient was provided an opportunity to ask questions and all were answered. The patient agreed with the plan and demonstrated an understanding of the instructions.  A copy of instructions were sent to the patient via MyChart unless otherwise noted below.     The patient was advised to call back or seek an in-person evaluation if  the symptoms worsen or if the condition fails to improve as anticipated.    Georgana Curio, FNP

## 2023-12-30 ENCOUNTER — Encounter: Payer: Self-pay | Admitting: Emergency Medicine

## 2023-12-30 ENCOUNTER — Ambulatory Visit
Admission: EM | Admit: 2023-12-30 | Discharge: 2023-12-30 | Disposition: A | Discharge status: Home / Self Care | Attending: Emergency Medicine | Admitting: Emergency Medicine

## 2023-12-30 ENCOUNTER — Other Ambulatory Visit: Payer: Self-pay

## 2023-12-30 DIAGNOSIS — J0100 Acute maxillary sinusitis, unspecified: Secondary | ICD-10-CM | POA: Diagnosis not present

## 2023-12-30 MED ORDER — PREDNISONE 10 MG (21) PO TBPK
ORAL_TABLET | Freq: Every day | ORAL | 0 refills | Status: DC
Start: 2023-12-30 — End: 2024-07-07

## 2023-12-30 MED ORDER — AMOXICILLIN-POT CLAVULANATE 875-125 MG PO TABS: 1.0000 | ORAL_TABLET | Freq: Two times a day (BID) | ORAL | 0 refills | Status: DC

## 2023-12-30 NOTE — Discharge Instructions (Signed)
 Today you are being treated for sinus infection  Begin Augmentin every morning and every evening for 7 days to clear bacteria  Begin prednisone every morning with food as directed to help with sinus pressure    You can take Tylenol and/or Ibuprofen as needed for fever reduction and pain relief.   For cough: honey 1/2 to 1 teaspoon (you can dilute the honey in water or another fluid).  You can also use guaifenesin and dextromethorphan for cough. You can use a humidifier for chest congestion and cough.  If you don't have a humidifier, you can sit in the bathroom with the hot shower running.      For sore throat: try warm salt water gargles, cepacol lozenges, throat spray, warm tea or water with lemon/honey, popsicles or ice, or OTC cold relief medicine for throat discomfort.   For congestion: take a daily anti-histamine like Zyrtec, Claritin, and a oral decongestant, such as pseudoephedrine.  You can also use Flonase 1-2 sprays in each nostril daily.   It is important to stay hydrated: drink plenty of fluids (water, gatorade/powerade/pedialyte, juices, or teas) to keep your throat moisturized and help further relieve irritation/discomfort.

## 2023-12-30 NOTE — ED Provider Notes (Signed)
 Linda Vargas    CSN: [id number] Arrival date & time: 12/30/23  1222      History   Chief Complaint Chief Complaint  Patient presents with   URI    HPI Linda Vargas is a 48 y.o. female.   Patient presents for evaluation of fever peaking at 100.1, nasal congestion, a productive cough with yellow sputum, intermittent headaches, sinus pressure across the cheeks, and a dry irritated throat present for 6 days.  Completed e-visit and initial start of symptoms, prophylactically treated with Tamiflu, additionally has been taking Coricidin, Mucinex, Tylenol, daily antihistamine and Flonase.  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   Pituitary microadenoma (HCC) 07/24/2021   COVID-19 long hauler 06/19/2021   Rash and nonspecific skin eruption 04/07/2021   Primary insomnia 01/11/2020   Vitamin D deficiency 04/11/2019   Vitamin B12 deficiency 04/03/2019   Essential hypertension 04/06/2018   Generalized anxiety disorder 04/06/2018   Depression, major, single episode, moderate (HCC) 01/21/2017   Intractable migraine with aura without status migrainosus 12/12/2016   Long-term use of Plaquenil 07/21/2014   Fibromyalgia 11/12/2012   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   Systemic lupus erythematosus (HCC) 06/27/2011    Past Surgical History:  Procedure Laterality Date   CHOLECYSTECTOMY  2012   DILATION AND CURETTAGE OF UTERUS  2011   DILATION AND CURETTAGE OF UTERUS   01/17/2012   Procedure: DILATATION AND CURETTAGE;  Surgeon: Zelphia Cairo, MD;  Location: [facility];  Service: Gynecology;  Laterality: N/A;  Dilatation and curratage, insertion of Bakri Balloon   FOOT SURGERY     HARDWARE REMOVAL Right 02/18/2018   Procedure: HARDWARE REMOVAL RIGHT WRIST;  Surgeon: Kennedy Bucker, MD;  Location: [facility];  Service: Orthopedics;  Laterality: Right;   OPEN REDUCTION INTERNAL FIXATION (ORIF) DISTAL RADIAL FRACTURE Right 11/26/2017   Procedure: OPEN REDUCTION INTERNAL FIXATION (ORIF) DISTAL RADIAL FRACTURE;  Surgeon: Kennedy Bucker, MD;  Location: [facility];  Service: Orthopedics;  Laterality: Right;   TONSILLECTOMY     WRIST SURGERY Right 02/18/2018    OB History     Gravida  4   Para  1   Term  1   Preterm  0   AB  2   Living  1      SAB  1   IAB  1   Ectopic  0   Multiple  0   Live Births  1            Home Medications    Prior to Admission medications   Medication Sig Start Date End Date Taking? Authorizing Provider  amoxicillin-clavulanate (AUGMENTIN) 875-125 MG tablet Take 1 tablet by mouth every 12 (twelve) hours. 12/30/23  Yes Onyekachi Gathright R, NP  predniSONE (STERAPRED UNI-PAK 21 TAB) 10 MG (21) TBPK tablet Take by mouth daily. Take 6 tabs by mouth daily  for 1 days, then 5 tabs for 1 days, then 4 tabs for 1 days,  then 3 tabs for 1 days, 2 tabs for 1 days, then 1 tab by mouth daily for 1 days 12/30/23  Yes Daymein Nunnery R, NP  acetaminophen (TYLENOL) 500 MG tablet Take 1,000 mg by mouth every 8 (eight) hours as needed for headache.     [provider]  albuterol (VENTOLIN HFA) 108 (90 Base) MCG/ACT inhaler Inhale 2 puffs into the lungs every 6 (six) hours as needed for wheezing or shortness of breath. 05/26/22   Carlean Jews, NP  amLODipine (NORVASC) 5 MG tablet Take 1 tablet (5 mg total) by mouth daily. Started per neurology to help with BP and migraine headaches 05/14/23   Saralyn Pilar A, PA  Azelastine HCl 0.15 %  SOLN INL 2 PFS IEN QD 02/15/18   [provider]  butalbital-acetaminophen-caffeine (FIORICET) 50-325-40 MG tablet butalbital-acetaminophen-caffeine 50 mg-325 mg-40 mg tablet  TAKE 1 TABLET BY MOUTH EVERY 4 HOURS FOR UP TO 10 DAYS AS NEEDED FOR PAIN    [provider]  clobetasol cream (TEMOVATE) 0.05 % Apply 1 application topically 2 (two) times daily. 03/27/21   Carlean Jews, NP  cyanocobalamin (VITAMIN B12) 1000 MCG/ML injection Inject into the muscle. 03/30/23   [provider]  DULoxetine (CYMBALTA) 30 MG capsule Take 1 capsule (30 mg total) by mouth 2 (two) times daily. 03/27/21   Carlean Jews, NP  EPIPEN 2-PAK 0.3 MG/0.3ML SOAJ injection INJECT INTRAMUSCULARLY AS DIRECTED FOR ANAPHYLATIC REACTIONS 11/30/15   [provider]  ergocalciferol (DRISDOL) 1.25 MG (50000 UT) capsule Take 1 capsule (50,000 Units total) by mouth 2 (two) times a week. 03/30/23   Carlean Jews, NP  fluticasone (FLONASE) 50 MCG/ACT nasal spray Place 2 sprays into both nostrils daily. 02/24/22   Waldon Merl, PA-C  hydroxychloroquine (PLAQUENIL) 200 MG tablet Take 200 mg by mouth 2 (two) times daily.    [provider]  hydroxychloroquine (PLAQUENIL) 200 MG tablet Take by mouth. 12/06/21   [provider]  hydrOXYzine (ATARAX) 25 MG tablet TAKE 1 TABLET(25 MG) BY MOUTH EVERY 8 HOURS AS NEEDED FOR ANXIETY 12/07/23   Saralyn Pilar A, PA  losartan (COZAAR) 50 MG tablet TAKE 1 TABLET(50 MG) BY MOUTH DAILY 05/14/23   Saralyn Pilar A, PA  montelukast (SINGULAIR) 10 MG tablet Take 1 tablet (10 mg total) by mouth at bedtime. 05/14/23   Saralyn Pilar A, PA  ondansetron (ZOFRAN-ODT) 8 MG disintegrating tablet Take 1 tablet (8 mg total) by mouth 2 (two) times daily. 05/14/23   Melida Quitter, PA  oseltamivir (TAMIFLU) 75 MG capsule Take 1 capsule (75 mg total) by mouth 2 (two) times daily for 5 days. 12/25/23 12/30/23  Delorse Lek, FNP  propranolol ER (INDERAL LA) 120  MG 24 hr capsule Take 1 capsule (120 mg total) by mouth daily. 05/14/23   Melida Quitter, PA    Family History Family History  Problem Relation Age of Onset   Hypertension Mother    Hypertension Father    Diabetes Father    Heart disease Maternal Grandmother    Heart disease Paternal Grandmother    Anesthesia problems Neg Hx     Social History Social History   Tobacco Use   Smoking status: Never    Passive exposure: Never   Smokeless tobacco: Never  Vaping Use   Vaping status: Never Used  Substance Use Topics   Alcohol use: No   Drug use: No     Allergies   Ibuprofen, Bean pod  extract, Corn-containing products, Peanut-containing drug products, and Latex   Review of Systems Review of Systems   Physical Exam Triage Vital Signs ED Triage Vitals  Encounter Vitals Group     BP 12/30/23 1300 (!) 174/105     Systolic BP Percentile --      Diastolic BP Percentile --      Pulse Rate 12/30/23 1300 66     Resp 12/30/23 1300 18     Temp 12/30/23 1300 99.5 F (37.5 C)     Temp Source 12/30/23 1300 Oral     SpO2 12/30/23 1300 98 %     Weight --      Height --      Head Circumference --      Peak Flow --      Pain Score 12/30/23 1301 6     Pain Loc --      Pain Education --      Exclude from Growth Chart --    No data found.  Updated Vital Signs BP (!) 174/105 (BP Location: Left Arm)   Pulse 66   Temp 99.5 F (37.5 C) (Oral)   Resp 18   LMP 12/04/2023 (Approximate)   SpO2 98%   Breastfeeding No   Visual Acuity Right Eye Distance:   Left Eye Distance:   Bilateral Distance:    Right Eye Near:   Left Eye Near:    Bilateral Near:     Physical Exam Constitutional:      Appearance: Normal appearance.  HENT:     Head: Normocephalic.     Right Ear: Tympanic membrane, ear canal and external ear normal.     Left Ear: Tympanic membrane, ear canal and external ear normal.     Nose: Congestion present.     Right Sinus: No maxillary sinus tenderness.      Left Sinus: Maxillary sinus tenderness present.     Mouth/Throat:     Pharynx: No oropharyngeal exudate or posterior oropharyngeal erythema.  Eyes:     Extraocular Movements: Extraocular movements intact.  Cardiovascular:     Rate and Rhythm: Normal rate and regular rhythm.     Pulses: Normal pulses.     Heart sounds: Normal heart sounds.  Pulmonary:     Effort: Pulmonary effort is normal.     Breath sounds: Normal breath sounds.  Neurological:     Mental Status: She is alert.      UC Treatments / Results  Labs (all labs ordered are listed, but only abnormal results are displayed) Labs Reviewed - No data to display  EKG   Radiology No results found.  Procedures Procedures (including critical care time)  Medications Ordered in UC Medications - No data to display  Initial Impression / Assessment and Plan / UC Course  I have reviewed the triage vital signs and the nursing notes.  Pertinent labs & imaging results that were available during my care of the patient were reviewed by me and considered in my medical decision making (see chart for details).  Acute nonrecurrent maxillary sinusitis  Patient is in no signs of distress nor toxic appearing.  Vital signs are stable.  Low suspicion for pneumonia, pneumothorax or bronchitis and therefore will defer imaging.  Consistent with a sinusitis, history of reoccurring.  Prophylactically placed on Augmentin as well as prescribed prednisone for symptomatic treatment.May use additional over-the-counter medications as needed for supportive care.  May follow-up with urgent care as needed if symptoms persist or worsen.  Note given.  Final Clinical Impressions(s) / UC Diagnoses   Final diagnoses:  Acute non-recurrent maxillary sinusitis     Discharge Instructions      Today you are being treated for sinus infection  Begin Augmentin every morning and every evening for 7 days to clear bacteria  Begin prednisone every morning  with food as directed to help with sinus pressure    You can take Tylenol and/or Ibuprofen as needed for fever reduction and pain relief.   For cough: honey 1/2 to 1 teaspoon (you can dilute the honey in water or another fluid).  You can also use guaifenesin and dextromethorphan for cough. You can use a humidifier for chest congestion and cough.  If you don't have a humidifier, you can sit in the bathroom with the hot shower running.      For sore throat: try warm salt water gargles, cepacol lozenges, throat spray, warm tea or water with lemon/honey, popsicles or ice, or OTC cold relief medicine for throat discomfort.   For congestion: take a daily anti-histamine like Zyrtec, Claritin, and a oral decongestant, such as pseudoephedrine.  You can also use Flonase 1-2 sprays in each nostril daily.   It is important to stay hydrated: drink plenty of fluids (water, gatorade/powerade/pedialyte, juices, or teas) to keep your throat moisturized and help further relieve irritation/discomfort.    ED Prescriptions     Medication Sig Dispense Auth. Provider   amoxicillin-clavulanate (AUGMENTIN) 875-125 MG tablet Take 1 tablet by mouth every 12 (twelve) hours. 14 tablet Delford Wingert R, NP   predniSONE (STERAPRED UNI-PAK 21 TAB) 10 MG (21) TBPK tablet Take by mouth daily. Take 6 tabs by mouth daily  for 1 days, then 5 tabs for 1 days, then 4 tabs for 1 days, then 3 tabs for 1 days, 2 tabs for 1 days, then 1 tab by mouth daily for 1 days 21 tablet Thresa Dozier, Elita Boone, NP      PDMP not reviewed this encounter.   Valinda Hoar, NP 12/30/23 1353

## 2023-12-30 NOTE — ED Triage Notes (Addendum)
 Patient presents to [facility] for evaluation of URI symptoms since Friday of last week, had a tele visit, diagnosed with flu, has been taking Tamiflu and Mucinex and coricidin, tylenol . Ha not been improving, coughing up yellow mucous, headache.  Hx of recurrent sinus infection.  Heaviness in chest after she coughs, or with exertion.  Difficulty sleeping due to not being able to get comfortable.  Patient finished tamiflu yesterday

## 2024-02-10 ENCOUNTER — Telehealth: Payer: Self-pay

## 2024-02-10 NOTE — Telephone Encounter (Signed)
 Copied from CRM [id number]. Topic: Appointments - Appointment Scheduling >> Feb 10, 2024 10:50 AM Linda Vargas wrote: Patient called today to schedule an appointment as her current PCP is no longer with the practice. She is requesting to transfer her care to a new PCP. After reviewing the schedules of providers currently accepting new patients, she was scheduled with NP Deborra Falter for October 7 and added to the waitlist. Patient expressed interest in being seen sooner if any earlier availability opens up, as she would like to follow up on her blood pressure and other concerns. She also mentioned that she will contact another clinic to check for earlier availability but plans to keep this appointment unless an earlier option becomes available.

## 2024-02-12 NOTE — Telephone Encounter (Signed)
 Noted.

## 2024-03-10 ENCOUNTER — Ambulatory Visit: Payer: 59 | Admitting: Family Medicine

## 2024-03-26 ENCOUNTER — Other Ambulatory Visit: Payer: Self-pay | Admitting: Nurse Practitioner

## 2024-03-26 DIAGNOSIS — R062 Wheezing: Secondary | ICD-10-CM

## 2024-03-28 ENCOUNTER — Other Ambulatory Visit: Payer: Self-pay

## 2024-03-28 DIAGNOSIS — R062 Wheezing: Secondary | ICD-10-CM

## 2024-03-28 MED ORDER — ALBUTEROL SULFATE HFA 108 (90 BASE) MCG/ACT IN AERS: 2.0000 | INHALATION_SPRAY | Freq: Four times a day (QID) | RESPIRATORY_TRACT | 2 refills | Status: AC | PRN

## 2024-04-08 ENCOUNTER — Telehealth: Admitting: Physician Assistant

## 2024-04-08 DIAGNOSIS — J0191 Acute recurrent sinusitis, unspecified: Secondary | ICD-10-CM | POA: Diagnosis not present

## 2024-04-08 MED ORDER — AMOXICILLIN-POT CLAVULANATE 875-125 MG PO TABS
1.0000 | ORAL_TABLET | Freq: Two times a day (BID) | ORAL | 0 refills | Status: DC
Start: 2024-04-08 — End: 2024-07-07

## 2024-04-08 NOTE — Patient Instructions (Signed)
 Linda Vargas, thank you for joining [name], PA-C for today's virtual visit.  While this provider is not your primary care provider (PCP), if your PCP is located in our provider database this encounter information will be shared with them immediately following your visit.   A [facility] MyChart account gives you access to today's visit and all your visits, tests, and labs performed at [facility]  click here if you don't have a  MyChart account or go to [email or web address]  Consent: (Patient) Linda Vargas provided verbal consent for this virtual visit at the beginning of the encounter.  Current Medications:  Current Outpatient Medications:    amoxicillin -clavulanate (AUGMENTIN ) 875-125 MG tablet, Take 1 tablet by mouth 2 (two) times daily., Disp: 20 tablet, Rfl: 0   acetaminophen  (TYLENOL ) 500 MG tablet, Take 1,000 mg by mouth every 8 (eight) hours as needed for headache. , Disp: , Rfl:    albuterol  (VENTOLIN  HFA) 108 (90 Base) MCG/ACT inhaler, Inhale 2 puffs into the lungs every 6 (six) hours as needed for wheezing or shortness of breath., Disp: 8.5 g, Rfl: 2   amLODipine  (NORVASC ) 5 MG tablet, Take 1 tablet (5 mg total) by mouth daily. Started per neurology to help with BP and migraine headaches, Disp: 90 tablet, Rfl: 1   Azelastine HCl 0.15 % SOLN, INL 2 PFS IEN QD, Disp: , Rfl: 12   butalbital -acetaminophen -caffeine  (FIORICET ) 50-325-40 MG tablet, butalbital -acetaminophen -caffeine  50 mg-325 mg-40 mg tablet  TAKE 1 TABLET BY MOUTH EVERY 4 HOURS FOR UP TO 10 DAYS AS NEEDED FOR PAIN, Disp: , Rfl:    clobetasol  cream (TEMOVATE ) 0.05 %, Apply 1 application topically 2 (two) times daily., Disp: 60 g, Rfl: 1   cyanocobalamin  (VITAMIN B12) 1000 MCG/ML injection, Inject into the muscle., Disp: , Rfl:    DULoxetine  (CYMBALTA ) 30 MG capsule, Take 1 capsule (30 mg total) by mouth 2 (two) times daily., Disp: 60 capsule, Rfl: 3   EPIPEN  2-PAK 0.3 MG/0.3ML  SOAJ injection, INJECT INTRAMUSCULARLY AS DIRECTED FOR ANAPHYLATIC REACTIONS, Disp: , Rfl: 0   ergocalciferol  (DRISDOL ) 1.25 MG (50000 UT) capsule, Take 1 capsule (50,000 Units total) by mouth 2 (two) times a week., Disp: 8 capsule, Rfl: 5   fluticasone  (FLONASE ) 50 MCG/ACT nasal spray, Place 2 sprays into both nostrils daily., Disp: 16 g, Rfl: 0   hydroxychloroquine  (PLAQUENIL ) 200 MG tablet, Take 200 mg by mouth 2 (two) times daily., Disp: , Rfl:    hydroxychloroquine  (PLAQUENIL ) 200 MG tablet, Take by mouth., Disp: , Rfl:    hydrOXYzine  (ATARAX ) 25 MG tablet, TAKE 1 TABLET(25 MG) BY MOUTH EVERY 8 HOURS AS NEEDED FOR ANXIETY, Disp: 90 tablet, Rfl: 0   losartan  (COZAAR ) 50 MG tablet, TAKE 1 TABLET(50 MG) BY MOUTH DAILY, Disp: 90 tablet, Rfl: 1   montelukast  (SINGULAIR ) 10 MG tablet, Take 1 tablet (10 mg total) by mouth at bedtime., Disp: 90 tablet, Rfl: 3   ondansetron  (ZOFRAN -ODT) 8 MG disintegrating tablet, Take 1 tablet (8 mg total) by mouth 2 (two) times daily., Disp: 30 tablet, Rfl: 12   predniSONE  (STERAPRED UNI-PAK 21 TAB) 10 MG (21) TBPK tablet, Take by mouth daily. Take 6 tabs by mouth daily  for 1 days, then 5 tabs for 1 days, then 4 tabs for 1 days, then 3 tabs for 1 days, 2 tabs for 1 days, then 1 tab by mouth daily for 1 days, Disp: 21 tablet, Rfl: 0   propranolol  ER (INDERAL  LA) 120 MG 24 hr  capsule, Take 1 capsule (120 mg total) by mouth daily., Disp: 90 capsule, Rfl: 1  Current Facility-Administered Medications:    cyanocobalamin  (VITAMIN B12) injection 1,000 mcg, 1,000 mcg, Intramuscular, Q30 days, Wallace Search A, PA, 1,000 mcg at 11/11/23 1604   Medications ordered in this encounter:  Meds ordered this encounter  Medications   amoxicillin -clavulanate (AUGMENTIN ) 875-125 MG tablet    Sig: Take 1 tablet by mouth 2 (two) times daily.    Dispense:  20 tablet    Refill:  0    Supervising Provider:   LAMPTEY, PHILIP O [[id number]]     *If you need refills on other medications  prior to your next appointment, please contact your pharmacy*  Follow-Up: Call back or seek an in-person evaluation if the symptoms worsen or if the condition fails to improve as anticipated.  [facility] [phone]  Other Instructions Continue with Mucinex .  Recommend Flonase  nasal spray.  Can take Ibuprofen or Tylenol  as needed.  If symptoms last longer than 7-10 days can initiate antibiotics.    If you have been instructed to have an in-person evaluation today at a local Urgent Care facility, please use the link below. It will take you to a list of all of our available [facility], including address, phone number and hours of operation. Please do not delay care.  [facility]  If you or a family member do not have a primary care provider, use the link below to schedule a visit and establish care. When you choose a [facility] primary care physician or advanced practice provider, you gain a long-term partner in health. Find a Primary Care Provider  Learn more about [facility] in-office and virtual care options: [facility] - Get Care Now

## 2024-04-08 NOTE — Progress Notes (Signed)
 Virtual Visit Consent   Madisynn D Kring, you are scheduled for a virtual visit with a [facility] provider today. Just as with appointments in the office, your consent must be obtained to participate. Your consent will be active for this visit and any virtual visit you may have with one of our providers in the next 365 days. If you have a MyChart account, a copy of this consent can be sent to you electronically.  As this is a virtual visit, video technology does not allow for your provider to perform a traditional examination. This may limit your provider's ability to fully assess your condition. If your provider identifies any concerns that need to be evaluated in person or the need to arrange testing (such as labs, EKG, etc.), we will make arrangements to do so. Although advances in technology are sophisticated, we cannot ensure that it will always work on either your end or our end. If the connection with a video visit is poor, the visit may have to be switched to a telephone visit. With either a video or telephone visit, we are not always able to ensure that we have a secure connection.  By engaging in this virtual visit, you consent to the provision of healthcare and authorize for your insurance to be billed (if applicable) for the services provided during this visit. Depending on your insurance coverage, you may receive a charge related to this service.  I need to obtain your verbal consent now. Are you willing to proceed with your visit today? Linda Vargas has provided verbal consent on 04/08/2024 for a virtual visit (video or telephone). [name], PA-C  Date: 04/08/2024 6:04 PM   Virtual Visit via Video Note   I, [name], connected with  Linda Vargas  ([id number], Dec 09, 1975) on 04/08/24 at  6:00 PM EDT by a video-enabled telemedicine application and verified that I am speaking with the correct person using two identifiers.  Location: Patient: Virtual Visit  Location Patient: Home Provider: Virtual Visit Location Provider: Home Office   I discussed the limitations of evaluation and management by telemedicine and the availability of in person appointments. The patient expressed understanding and agreed to proceed.    History of Present Illness: Linda Vargas is a 48 y.o. who identifies as a female who was assigned female at birth, and is being seen today for sinus pressure, congestion, and headache that started a few days.  She reports h/o of recurrent sinus infection.  She is currently taking mucinex  and tylenol , sinuglair.  Denies fevers.  Reports some body aches.   HPI: HPI  Problems:  Patient Active Problem List   Diagnosis Date Noted   Chronic allergic rhinitis 07/06/2022   Menorrhagia with irregular cycle 08/28/2021   Body mass index (BMI) of 33.0-33.9 in adult 08/28/2021   White matter disease 07/24/2021   Pituitary microadenoma (HCC) 07/24/2021   COVID-19 long hauler 06/19/2021   Rash and nonspecific skin eruption 04/07/2021   Primary insomnia 01/11/2020   Vitamin D  deficiency 04/11/2019   Vitamin B12 deficiency 04/03/2019   Essential hypertension 04/06/2018   Generalized anxiety disorder 04/06/2018   Depression, major, single episode, moderate (HCC) 01/21/2017   Intractable migraine with aura without status migrainosus 12/12/2016   Long-term use of Plaquenil  07/21/2014   Fibromyalgia 11/12/2012   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 , Disp: , Rfl:    albuterol  (VENTOLIN  HFA) 108 (90 Base) MCG/ACT inhaler, Inhale 2 puffs into the lungs every 6 (six) hours as needed for wheezing or shortness of breath., Disp: 8.5 g, Rfl: 2   amLODipine  (NORVASC ) 5 MG tablet, Take 1 tablet (5 mg total) by mouth daily. Started per neurology to help with BP and migraine headaches, Disp: 90 tablet, Rfl: 1   Azelastine HCl 0.15 % SOLN, INL 2 PFS IEN QD, Disp: , Rfl: 12   butalbital -acetaminophen -caffeine  (FIORICET ) 50-325-40 MG tablet, butalbital -acetaminophen -caffeine  50 mg-325 mg-40 mg tablet  TAKE 1 TABLET BY MOUTH EVERY 4 HOURS FOR UP TO 10 DAYS AS NEEDED FOR PAIN, Disp: , Rfl:    clobetasol  cream (TEMOVATE ) 0.05 %, Apply 1 application topically 2 (two) times daily., Disp: 60 g, Rfl: 1   cyanocobalamin  (VITAMIN B12) 1000 MCG/ML injection, Inject into the muscle., Disp: , Rfl:    DULoxetine  (CYMBALTA ) 30 MG capsule, Take 1 capsule (30 mg total) by mouth 2 (two) times daily., Disp: 60 capsule, Rfl: 3   EPIPEN  2-PAK 0.3 MG/0.3ML SOAJ injection, INJECT INTRAMUSCULARLY AS DIRECTED FOR ANAPHYLATIC REACTIONS, Disp: , Rfl: 0   ergocalciferol  (DRISDOL ) 1.25 MG (50000 UT) capsule, Take 1 capsule (50,000 Units total) by mouth 2 (two) times a week., Disp: 8 capsule, Rfl: 5   fluticasone  (FLONASE ) 50 MCG/ACT nasal spray, Place 2 sprays into both nostrils daily., Disp: 16 g, Rfl: 0   hydroxychloroquine  (PLAQUENIL ) 200 MG tablet, Take 200 mg by mouth 2 (two) times daily., Disp: , Rfl:    hydroxychloroquine  (PLAQUENIL ) 200 MG tablet, Take by mouth., Disp: , Rfl:    hydrOXYzine  (ATARAX ) 25 MG tablet, TAKE 1 TABLET(25 MG) BY MOUTH EVERY 8 HOURS AS NEEDED FOR ANXIETY, Disp: 90 tablet, Rfl: 0   losartan  (COZAAR ) 50 MG tablet, TAKE 1 TABLET(50 MG) BY MOUTH DAILY, Disp: 90 tablet, Rfl: 1   montelukast  (SINGULAIR ) 10 MG tablet, Take 1 tablet (10 mg total) by mouth at bedtime., Disp: 90 tablet,  Rfl: 3   ondansetron  (ZOFRAN -ODT) 8 MG disintegrating tablet, Take 1 tablet (8 mg total) by mouth 2 (two) times daily., Disp: 30 tablet, Rfl: 12   predniSONE  (STERAPRED UNI-PAK 21 TAB) 10 MG (21) TBPK tablet, Take by mouth daily. Take 6 tabs by mouth daily  for 1 days, then 5 tabs for 1 days, then 4 tabs for 1 days, then 3 tabs for 1 days, 2 tabs for 1 days, then 1 tab by mouth daily for 1 days, Disp: 21 tablet, Rfl: 0   propranolol  ER (INDERAL  LA) 120 MG 24 hr capsule, Take 1 capsule (120 mg total) by mouth daily., Disp: 90 capsule, Rfl: 1  Current Facility-Administered Medications:    cyanocobalamin  (VITAMIN B12) injection 1,000 mcg, 1,000 mcg, Intramuscular, Q30 days, Wallace Search A, PA, 1,000 mcg at 11/11/23 1604  Observations/Objective: Patient is well-developed, well-nourished in no acute distress.  Resting comfortably  at home.  Head is normocephalic, atraumatic.  No labored breathing.  Speech is clear and coherent with logical content.  Patient is alert and  oriented at baseline.    Assessment and Plan: 1. Acute recurrent sinusitis, unspecified location (Primary)  Recommend supportive care.  Will send in antibiotic, advised pt to hold and only start if symptoms last longer than 7-10 day.   Follow Up Instructions: I discussed the assessment and treatment plan with the patient. The patient was provided an opportunity to ask questions and all were answered. The patient agreed with the plan and demonstrated an understanding of the instructions.  A copy of instructions were sent to the patient via MyChart unless otherwise noted below.     The patient was advised to call back or seek an in-person evaluation if the symptoms worsen or if the condition fails to improve as anticipated.    [name], PA-C

## 2024-04-27 ENCOUNTER — Ambulatory Visit

## 2024-06-15 ENCOUNTER — Other Ambulatory Visit: Payer: Self-pay | Admitting: Family Medicine

## 2024-06-15 DIAGNOSIS — R112 Nausea with vomiting, unspecified: Secondary | ICD-10-CM

## 2024-06-21 ENCOUNTER — Ambulatory Visit

## 2024-07-07 ENCOUNTER — Telehealth: Admitting: Physician Assistant

## 2024-07-07 DIAGNOSIS — B9689 Other specified bacterial agents as the cause of diseases classified elsewhere: Secondary | ICD-10-CM

## 2024-07-07 DIAGNOSIS — J019 Acute sinusitis, unspecified: Secondary | ICD-10-CM | POA: Diagnosis not present

## 2024-07-07 DIAGNOSIS — T3695XA Adverse effect of unspecified systemic antibiotic, initial encounter: Secondary | ICD-10-CM | POA: Diagnosis not present

## 2024-07-07 DIAGNOSIS — B379 Candidiasis, unspecified: Secondary | ICD-10-CM

## 2024-07-07 MED ORDER — FLUCONAZOLE 150 MG PO TABS: 150.0000 mg | ORAL_TABLET | ORAL | 0 refills | Status: DC | PRN

## 2024-07-07 MED ORDER — AMOXICILLIN-POT CLAVULANATE 875-125 MG PO TABS: 1.0000 | ORAL_TABLET | Freq: Two times a day (BID) | ORAL | 0 refills | Status: DC

## 2024-07-07 MED ORDER — PREDNISONE 10 MG (21) PO TBPK: ORAL_TABLET | ORAL | 0 refills | Status: DC

## 2024-07-07 NOTE — Patient Instructions (Signed)
 Dorman JONETTA Ellen, thank you for joining Delon CHRISTELLA Dickinson, PA-C for today's virtual visit.  While this provider is not your primary care provider (PCP), if your PCP is located in our provider database this encounter information will be shared with them immediately following your visit.   A [facility] MyChart account gives you access to today's visit and all your visits, tests, and labs performed at [facility]  click here if you don't have a [facility] MyChart account or go to [email or web address]  Consent: (Patient) Dorman JONETTA Ellen provided verbal consent for this virtual visit at the beginning of the encounter.  Current Medications:  Current Outpatient Medications:    amoxicillin -clavulanate (AUGMENTIN ) 875-125 MG tablet, Take 1 tablet by mouth 2 (two) times daily., Disp: 14 tablet, Rfl: 0   fluconazole  (DIFLUCAN ) 150 MG tablet, Take 1 tablet (150 mg total) by mouth every 3 (three) days as needed., Disp: 2 tablet, Rfl: 0   predniSONE  (STERAPRED UNI-PAK 21 TAB) 10 MG (21) TBPK tablet, 6 day taper ; take as directed on package instructions, Disp: 21 tablet, Rfl: 0   acetaminophen  (TYLENOL ) 500 MG tablet, Take 1,000 mg by mouth every 8 (eight) hours as needed for headache. , Disp: , Rfl:    albuterol  (VENTOLIN  HFA) 108 (90 Base) MCG/ACT inhaler, Inhale 2 puffs into the lungs every 6 (six) hours as needed for wheezing or shortness of breath., Disp: 8.5 g, Rfl: 2   amLODipine  (NORVASC ) 5 MG tablet, Take 1 tablet (5 mg total) by mouth daily. Started per neurology to help with BP and migraine headaches, Disp: 90 tablet, Rfl: 1   Azelastine HCl 0.15 % SOLN, INL 2 PFS IEN QD, Disp: , Rfl: 12   butalbital -acetaminophen -caffeine  (FIORICET ) 50-325-40 MG tablet, butalbital -acetaminophen -caffeine  50 mg-325 mg-40 mg tablet  TAKE 1 TABLET BY MOUTH EVERY 4 HOURS FOR UP TO 10 DAYS AS NEEDED FOR PAIN, Disp: , Rfl:    clobetasol  cream (TEMOVATE ) 0.05 %, Apply 1 application topically 2  (two) times daily., Disp: 60 g, Rfl: 1   cyanocobalamin  (VITAMIN B12) 1000 MCG/ML injection, Inject into the muscle., Disp: , Rfl:    DULoxetine  (CYMBALTA ) 30 MG capsule, Take 1 capsule (30 mg total) by mouth 2 (two) times daily., Disp: 60 capsule, Rfl: 3   EPIPEN  2-PAK 0.3 MG/0.3ML SOAJ injection, INJECT INTRAMUSCULARLY AS DIRECTED FOR ANAPHYLATIC REACTIONS, Disp: , Rfl: 0   ergocalciferol  (DRISDOL ) 1.25 MG (50000 UT) capsule, Take 1 capsule (50,000 Units total) by mouth 2 (two) times a week., Disp: 8 capsule, Rfl: 5   fluticasone  (FLONASE ) 50 MCG/ACT nasal spray, Place 2 sprays into both nostrils daily., Disp: 16 g, Rfl: 0   hydroxychloroquine  (PLAQUENIL ) 200 MG tablet, Take 200 mg by mouth 2 (two) times daily., Disp: , Rfl:    hydroxychloroquine  (PLAQUENIL ) 200 MG tablet, Take by mouth., Disp: , Rfl:    hydrOXYzine  (ATARAX ) 25 MG tablet, TAKE 1 TABLET(25 MG) BY MOUTH EVERY 8 HOURS AS NEEDED FOR ANXIETY, Disp: 90 tablet, Rfl: 0   losartan  (COZAAR ) 50 MG tablet, TAKE 1 TABLET(50 MG) BY MOUTH DAILY, Disp: 90 tablet, Rfl: 1   montelukast  (SINGULAIR ) 10 MG tablet, Take 1 tablet (10 mg total) by mouth at bedtime., Disp: 90 tablet, Rfl: 3   ondansetron  (ZOFRAN -ODT) 8 MG disintegrating tablet, DISSOLVE 1 TABLET(8 MG) ON THE TONGUE TWICE DAILY, Disp: 30 tablet, Rfl: 2   propranolol  ER (INDERAL  LA) 120 MG 24 hr capsule, Take 1 capsule (120 mg total) by mouth daily., Disp: 90 capsule,  Rfl: 1  Current Facility-Administered Medications:    cyanocobalamin  (VITAMIN B12) injection 1,000 mcg, 1,000 mcg, Intramuscular, Q30 days, Wallace Search A, PA, 1,000 mcg at 11/11/23 1604   Medications ordered in this encounter:  Meds ordered this encounter  Medications   amoxicillin -clavulanate (AUGMENTIN ) 875-125 MG tablet    Sig: Take 1 tablet by mouth 2 (two) times daily.    Dispense:  14 tablet    Refill:  0    Supervising Provider:   BLAISE ALEENE KIDD [[id number]]   predniSONE  (STERAPRED UNI-PAK 21 TAB) 10 MG (21)  TBPK tablet    Sig: 6 day taper ; take as directed on package instructions    Dispense:  21 tablet    Refill:  0    Supervising Provider:   LAMPTEY, PHILIP O [[id number]]   fluconazole  (DIFLUCAN ) 150 MG tablet    Sig: Take 1 tablet (150 mg total) by mouth every 3 (three) days as needed.    Dispense:  2 tablet    Refill:  0    Supervising Provider:   LAMPTEY, PHILIP O [[id number]]     *If you need refills on other medications prior to your next appointment, please contact your pharmacy*  Follow-Up: Call back or seek an in-person evaluation if the symptoms worsen or if the condition fails to improve as anticipated.  [facility] [phone]  Other Instructions Sinus Infection, Adult A sinus infection, also called sinusitis, is inflammation of your sinuses. Sinuses are hollow spaces in the bones around your face. Your sinuses are located: Around your eyes. In the middle of your forehead. Behind your nose. In your cheekbones. Mucus normally drains out of your sinuses. When your nasal tissues become inflamed or swollen, mucus can become trapped or blocked. This allows bacteria, viruses, and fungi to grow, which leads to infection. Most infections of the sinuses are caused by a virus. A sinus infection can develop quickly. It can last for up to 4 weeks (acute) or for more than 12 weeks (chronic). A sinus infection often develops after a cold. What are the causes? This condition is caused by anything that creates swelling in the sinuses or stops mucus from draining. This includes: Allergies. Asthma. Infection from bacteria or viruses. Deformities or blockages in your nose or sinuses. Abnormal growths in the nose (nasal polyps). Pollutants, such as chemicals or irritants in the air. Infection from fungi. This is rare. What increases the risk? You are more likely to develop this condition if you: Have a weak body defense system (immune system). Do a lot of swimming or  diving. Overuse nasal sprays. Smoke. What are the signs or symptoms? The main symptoms of this condition are pain and a feeling of pressure around the affected sinuses. Other symptoms include: Stuffy nose or congestion that makes it difficult to breathe through your nose. Thick yellow or greenish drainage from your nose. Tenderness, swelling, and warmth over the affected sinuses. A cough that may get worse at night. Decreased sense of smell and taste. Extra mucus that collects in the throat or the back of the nose (postnasal drip) causing a sore throat or bad breath. Tiredness (fatigue). Fever. How is this diagnosed? This condition is diagnosed based on: Your symptoms. Your medical history. A physical exam. Tests to find out if your condition is acute or chronic. This may include: Checking your nose for nasal polyps. Viewing your sinuses using a device that has a light (endoscope). Testing for allergies or bacteria. Imaging tests, such  as an MRI or CT scan. In rare cases, a bone biopsy may be done to rule out more serious types of fungal sinus disease. How is this treated? Treatment for a sinus infection depends on the cause and whether your condition is chronic or acute. If caused by a virus, your symptoms should go away on their own within 10 days. You may be given medicines to relieve symptoms. They include: Medicines that shrink swollen nasal passages (decongestants). A spray that eases inflammation of the nostrils (topical intranasal corticosteroids). Rinses that help get rid of thick mucus in your nose (nasal saline washes). Medicines that treat allergies (antihistamines). Over-the-counter pain relievers. If caused by bacteria, your health care provider may recommend waiting to see if your symptoms improve. Most bacterial infections will get better without antibiotic medicine. You may be given antibiotics if you have: A severe infection. A weak immune system. If caused by  narrow nasal passages or nasal polyps, surgery may be needed. Follow these instructions at home: Medicines Take, use, or apply over-the-counter and prescription medicines only as told by your health care provider. These may include nasal sprays. If you were prescribed an antibiotic medicine, take it as told by your health care provider. Do not stop taking the antibiotic even if you start to feel better. Hydrate and humidify  Drink enough fluid to keep your urine pale yellow. Staying hydrated will help to thin your mucus. Use a cool mist humidifier to keep the humidity level in your home above 50%. Inhale steam for 10-15 minutes, 3-4 times a day, or as told by your health care provider. You can do this in the bathroom while a hot shower is running. Limit your exposure to cool or dry air. Rest Rest as much as possible. Sleep with your head raised (elevated). Make sure you get enough sleep each night. General instructions  Apply a warm, moist washcloth to your face 3-4 times a day or as told by your health care provider. This will help with discomfort. Use nasal saline washes as often as told by your health care provider. Wash your hands often with soap and water to reduce your exposure to germs. If soap and water are not available, use hand sanitizer. Do not smoke. Avoid being around people who are smoking (secondhand smoke). Keep all follow-up visits. This is important. Contact a health care provider if: You have a fever. Your symptoms get worse. Your symptoms do not improve within 10 days. Get help right away if: You have a severe headache. You have persistent vomiting. You have severe pain or swelling around your face or eyes. You have vision problems. You develop confusion. Your neck is stiff. You have trouble breathing. These symptoms may be an emergency. Get help right away. Call 911. Do not wait to see if the symptoms will go away. Do not drive yourself to the  hospital. Summary A sinus infection is soreness and inflammation of your sinuses. Sinuses are hollow spaces in the bones around your face. This condition is caused by nasal tissues that become inflamed or swollen. The swelling traps or blocks the flow of mucus. This allows bacteria, viruses, and fungi to grow, which leads to infection. If you were prescribed an antibiotic medicine, take it as told by your health care provider. Do not stop taking the antibiotic even if you start to feel better. Keep all follow-up visits. This is important. This information is not intended to replace advice given to you by your health care provider.  Make sure you discuss any questions you have with your health care provider. Document Revised: 09/03/2021 Document Reviewed: 09/03/2021 Elsevier Patient Education  2024 [employer]   If you have been instructed to have an in-person evaluation today at a local Urgent Care facility, please use the link below. It will take you to a list of all of our available [facility], including address, phone number and hours of operation. Please do not delay care.  [facility]  If you or a family member do not have a primary care provider, use the link below to schedule a visit and establish care. When you choose a [facility] primary care physician or advanced practice provider, you gain a long-term partner in health. Find a Primary Care Provider  Learn more about 's in-office and virtual care options:  - Get Care Now

## 2024-07-07 NOTE — Progress Notes (Signed)
 Virtual Visit Consent   [name], you are scheduled for a virtual visit with a  provider today. Just as with appointments in the office, your consent must be obtained to participate. Your consent will be active for this visit and any virtual visit you may have with one of our providers in the next 365 days. If you have a MyChart account, a copy of this consent can be sent to you electronically.  As this is a virtual visit, video technology does not allow for your provider to perform a traditional examination. This may limit your provider's ability to fully assess your condition. If your provider identifies any concerns that need to be evaluated in person or the need to arrange testing (such as labs, EKG, etc.), we will make arrangements to do so. Although advances in technology are sophisticated, we cannot ensure that it will always work on either your end or our end. If the connection with a video visit is poor, the visit may have to be switched to a telephone visit. With either a video or telephone visit, we are not always able to ensure that we have a secure connection.  By engaging in this virtual visit, you consent to the provision of healthcare and authorize for your insurance to be billed (if applicable) for the services provided during this visit. Depending on your insurance coverage, you may receive a charge related to this service.  I need to obtain your verbal consent now. Are you willing to proceed with your visit today? SHAWAN Vargas has provided verbal consent on 07/07/2024 for a virtual visit (video or telephone). Delon CHRISTELLA Dickinson, PA-C  Date: 07/07/2024 4:25 PM   Virtual Visit via Video Note   I, Delon CHRISTELLA Dickinson, connected with  Linda Vargas  ([id number], 14-Apr-1976) on 07/07/24 at  4:00 PM EDT by a video-enabled telemedicine application and verified that I am speaking with the correct person using two identifiers.  Location: Patient: Virtual  Visit Location Patient: Home Provider: Virtual Visit Location Provider: Home Office   I discussed the limitations of evaluation and management by telemedicine and the availability of in person appointments. The patient expressed understanding and agreed to proceed.    History of Present Illness: Linda Vargas is a 48 y.o. who identifies as a female who was assigned female at birth, and is being seen today for sinus congestion.  HPI: URI  This is a new problem. The current episode started 1 to 4 weeks ago (Had Covid on 06/20/24 (tested positive that day); These symptoms started last week with allergy like symptoms then worsened over the last few days). The problem has been unchanged. Maximum temperature: hot and cold flashes. The fever has been present for Less than 1 day. Associated symptoms include congestion, coughing, diarrhea (2 days ago, intermittent), ear pain, headaches, rhinorrhea, sinus pain, sneezing and a sore throat (scratchy to sore). Pertinent negatives include no nausea, plugged ear sensation, vomiting or wheezing. Treatments tried: tylenol , mucinex , azelastine, flonase , singulair . The treatment provided no relief.  PMH: Lupus   Problems:  Patient Active Problem List   Diagnosis Date Noted   Chronic allergic rhinitis 07/06/2022   Menorrhagia with irregular cycle 08/28/2021   Body mass index (BMI) of 33.0-33.9 in adult 08/28/2021   White matter disease 07/24/2021   Pituitary microadenoma (HCC) 07/24/2021   COVID-19 long hauler 06/19/2021   Rash and nonspecific skin eruption 04/07/2021   Primary insomnia 01/11/2020   Vitamin D  deficiency 04/11/2019  Vitamin B12 deficiency 04/03/2019   Essential hypertension 04/06/2018   Generalized anxiety disorder 04/06/2018   Depression, major, single episode, moderate (HCC) 01/21/2017   Intractable migraine with aura without status migrainosus 12/12/2016   Long-term use of Plaquenil  07/21/2014   Fibromyalgia 11/12/2012    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   Systemic lupus erythematosus (HCC) 06/27/2011    Allergies:  Allergies  Allergen Reactions   Ibuprofen Swelling    Lips swell   Bean Pod Extract Hives   Corn-Containing Products Hives   Peanut-Containing Drug Products Hives   Latex Other (See Comments)   Medications:  Current Outpatient Medications:    amoxicillin -clavulanate (AUGMENTIN ) 875-125 MG tablet, Take 1 tablet by mouth 2 (two) times daily., Disp: 14 tablet, Rfl: 0   fluconazole  (DIFLUCAN ) 150 MG tablet, Take 1 tablet (150 mg total) by mouth every 3 (three) days as needed., Disp: 2 tablet, Rfl: 0   predniSONE  (STERAPRED UNI-PAK 21 TAB) 10 MG (21) TBPK tablet, 6 day taper ; take as directed on package instructions, Disp: 21 tablet, Rfl: 0   acetaminophen  (TYLENOL ) 500 MG tablet, Take 1,000 mg by mouth every 8 (eight) hours as needed for headache. , Disp: , Rfl:    albuterol  (VENTOLIN  HFA) 108 (90 Base) MCG/ACT inhaler, Inhale 2 puffs into the lungs every 6 (six) hours as needed for wheezing or shortness of breath., Disp: 8.5 g, Rfl: 2   amLODipine  (NORVASC ) 5 MG tablet, Take 1 tablet (5 mg total) by mouth daily. Started per neurology to help with BP and migraine headaches, Disp: 90 tablet, Rfl: 1   Azelastine HCl 0.15 % SOLN, INL 2 PFS IEN QD, Disp: , Rfl: 12   butalbital -acetaminophen -caffeine  (FIORICET ) 50-325-40 MG tablet, butalbital -acetaminophen -caffeine  50 mg-325 mg-40 mg tablet  TAKE 1 TABLET BY MOUTH EVERY 4 HOURS FOR UP TO 10 DAYS AS NEEDED FOR PAIN, Disp: , Rfl:    clobetasol  cream (TEMOVATE ) 0.05 %, Apply 1 application topically 2 (two) times daily., Disp: 60 g, Rfl: 1   cyanocobalamin  (VITAMIN B12) 1000 MCG/ML injection, Inject into the muscle., Disp: , Rfl:    DULoxetine  (CYMBALTA ) 30 MG capsule, Take 1 capsule (30 mg total) by mouth 2 (two) times daily., Disp: 60 capsule, Rfl: 3   EPIPEN  2-PAK 0.3 MG/0.3ML SOAJ injection, INJECT INTRAMUSCULARLY AS DIRECTED FOR  ANAPHYLATIC REACTIONS, Disp: , Rfl: 0   ergocalciferol  (DRISDOL ) 1.25 MG (50000 UT) capsule, Take 1 capsule (50,000 Units total) by mouth 2 (two) times a week., Disp: 8 capsule, Rfl: 5   fluticasone  (FLONASE ) 50 MCG/ACT nasal spray, Place 2 sprays into both nostrils daily., Disp: 16 g, Rfl: 0   hydroxychloroquine  (PLAQUENIL ) 200 MG tablet, Take 200 mg by mouth 2 (two) times daily., Disp: , Rfl:    hydroxychloroquine  (PLAQUENIL ) 200 MG tablet, Take by mouth., Disp: , Rfl:    hydrOXYzine  (ATARAX ) 25 MG tablet, TAKE 1 TABLET(25 MG) BY MOUTH EVERY 8 HOURS AS NEEDED FOR ANXIETY, Disp: 90 tablet, Rfl: 0   losartan  (COZAAR ) 50 MG tablet, TAKE 1 TABLET(50 MG) BY MOUTH DAILY, Disp: 90 tablet, Rfl: 1   montelukast  (SINGULAIR ) 10 MG tablet, Take 1 tablet (10 mg total) by mouth at bedtime., Disp: 90 tablet, Rfl: 3   ondansetron  (ZOFRAN -ODT) 8 MG disintegrating tablet, DISSOLVE 1 TABLET(8 MG) ON THE TONGUE TWICE DAILY, Disp: 30 tablet, Rfl: 2   propranolol  ER (INDERAL  LA) 120 MG 24 hr capsule, Take 1 capsule (120 mg total) by mouth daily., Disp: 90 capsule, Rfl: 1  Current Facility-Administered  Medications:    cyanocobalamin  (VITAMIN B12) injection 1,000 mcg, 1,000 mcg, Intramuscular, Q30 days, Wallace Search A, PA, 1,000 mcg at 11/11/23 1604  Observations/Objective: Patient is well-developed, well-nourished in no acute distress.  Resting comfortably at home.  Head is normocephalic, atraumatic.  No labored breathing.  Speech is clear and coherent with logical content.  Patient is alert and oriented at baseline.    Assessment and Plan: 1. Acute bacterial sinusitis (Primary) - amoxicillin -clavulanate (AUGMENTIN ) 875-125 MG tablet; Take 1 tablet by mouth 2 (two) times daily.  Dispense: 14 tablet; Refill: 0 - predniSONE  (STERAPRED UNI-PAK 21 TAB) 10 MG (21) TBPK tablet; 6 day taper ; take as directed on package instructions  Dispense: 21 tablet; Refill: 0  2. Antibiotic-induced yeast infection -  fluconazole  (DIFLUCAN ) 150 MG tablet; Take 1 tablet (150 mg total) by mouth every 3 (three) days as needed.  Dispense: 2 tablet; Refill: 0  - Worsening symptoms that have not responded to OTC medications.  - Will give Augmentin  and Prednisone  - Continue allergy medications.  - Steam and humidifier can help - Stay well hydrated and get plenty of rest.  - Seek in person evaluation if no symptom improvement or if symptoms worsen   Follow Up Instructions: I discussed the assessment and treatment plan with the patient. The patient was provided an opportunity to ask questions and all were answered. The patient agreed with the plan and demonstrated an understanding of the instructions.  A copy of instructions were sent to the patient via MyChart unless otherwise noted below.    The patient was advised to call back or seek an in-person evaluation if the symptoms worsen or if the condition fails to improve as anticipated.    Delon CHRISTELLA Dickinson, PA-C

## 2024-07-19 ENCOUNTER — Encounter: Admitting: Nurse Practitioner

## 2024-08-23 ENCOUNTER — Encounter

## 2024-09-07 ENCOUNTER — Ambulatory Visit

## 2024-09-07 VITALS — BP 150/78 | HR 62 | Temp 98.1°F | Ht 69.0 in | Wt 227.0 lb

## 2024-09-07 DIAGNOSIS — E538 Deficiency of other specified B group vitamins: Secondary | ICD-10-CM

## 2024-09-07 DIAGNOSIS — D352 Benign neoplasm of pituitary gland: Secondary | ICD-10-CM

## 2024-09-07 DIAGNOSIS — M3219 Other organ or system involvement in systemic lupus erythematosus: Secondary | ICD-10-CM | POA: Diagnosis not present

## 2024-09-07 DIAGNOSIS — M797 Fibromyalgia: Secondary | ICD-10-CM

## 2024-09-07 DIAGNOSIS — F411 Generalized anxiety disorder: Secondary | ICD-10-CM

## 2024-09-07 DIAGNOSIS — Z6833 Body mass index (BMI) 33.0-33.9, adult: Secondary | ICD-10-CM

## 2024-09-07 DIAGNOSIS — I10 Essential (primary) hypertension: Secondary | ICD-10-CM

## 2024-09-07 DIAGNOSIS — F5101 Primary insomnia: Secondary | ICD-10-CM

## 2024-09-07 DIAGNOSIS — J309 Allergic rhinitis, unspecified: Secondary | ICD-10-CM

## 2024-09-07 DIAGNOSIS — Z1211 Encounter for screening for malignant neoplasm of colon: Secondary | ICD-10-CM

## 2024-09-07 DIAGNOSIS — G43119 Migraine with aura, intractable, without status migrainosus: Secondary | ICD-10-CM | POA: Diagnosis not present

## 2024-09-07 DIAGNOSIS — R350 Frequency of micturition: Secondary | ICD-10-CM

## 2024-09-07 DIAGNOSIS — E559 Vitamin D deficiency, unspecified: Secondary | ICD-10-CM

## 2024-09-07 DIAGNOSIS — K219 Gastro-esophageal reflux disease without esophagitis: Secondary | ICD-10-CM

## 2024-09-07 DIAGNOSIS — R21 Rash and other nonspecific skin eruption: Secondary | ICD-10-CM

## 2024-09-07 DIAGNOSIS — Z0000 Encounter for general adult medical examination without abnormal findings: Secondary | ICD-10-CM

## 2024-09-07 DIAGNOSIS — R112 Nausea with vomiting, unspecified: Secondary | ICD-10-CM

## 2024-09-07 MED ORDER — DESVENLAFAXINE SUCCINATE ER 25 MG PO TB24: 25.0000 mg | ORAL_TABLET | Freq: Every day | ORAL | 2 refills | Status: DC

## 2024-09-07 MED ORDER — HYDROXYZINE HCL 25 MG PO TABS: 25.0000 mg | ORAL_TABLET | Freq: Three times a day (TID) | ORAL | 0 refills | Status: AC | PRN

## 2024-09-07 MED ORDER — FAMOTIDINE 20 MG PO TABS: 20.0000 mg | ORAL_TABLET | Freq: Two times a day (BID) | ORAL | 1 refills | Status: AC

## 2024-09-07 MED ORDER — PROPRANOLOL HCL ER 120 MG PO CP24: 120.0000 mg | ORAL_CAPSULE | Freq: Every day | ORAL | 1 refills | Status: AC

## 2024-09-07 MED ORDER — MONTELUKAST SODIUM 10 MG PO TABS: 10.0000 mg | ORAL_TABLET | Freq: Every day | ORAL | 3 refills | Status: AC

## 2024-09-07 MED ORDER — LOSARTAN POTASSIUM 50 MG PO TABS: ORAL_TABLET | ORAL | 1 refills | Status: AC

## 2024-09-07 NOTE — Patient Instructions (Signed)
 VISIT SUMMARY: Today, we addressed your anxiety, hypertension, frequent urination, and other ongoing health issues. We made adjustments to your medications and provided referrals for further evaluation and management.  YOUR PLAN: SYSTEMIC LUPUS ERYTHEMATOSUS: Chronic condition with joint pain and lupus fog. -Continue taking hydroxychloroquine  200 mg twice daily. -You have been referred to [facility] Rheumatology for follow-up and management.  ESSENTIAL HYPERTENSION: Your blood pressure readings at home range from 132/79 to 140/80 mmHg. -Continue taking propranolol  and losartan  as prescribed. -We provided a blood pressure log for you to monitor your readings at home. -We rechecked your blood pressure before you left the clinic.  GENERALIZED ANXIETY DISORDER: Increased anxiety over the past two months due to stressors. -Start taking Pristiq  as prescribed, with a 30-day supply and refills. -Take Pristiq  at the same time every day. -We discussed potential side effects and risks of Pristiq . -Use half a tablet of hydroxyzine  at bedtime to help with sleep.  MIGRAINE: Your migraines are well-controlled with propranolol . -Continue taking propranolol  for migraine prevention.  FIBROMYALGIA: Chronic joint pain overlapping with lupus. -You have been referred to [facility]ology for further evaluation and management.  PITUITARY MICROADENOMA: Microadenoma under surveillance with no growth. -Continue surveillance with endocrinology.  ALLERGIC RHINITIS AND MULTIPLE ALLERGIES: Chronic condition managed with montelukast  and hydroxyzine . -Continue taking montelukast  and hydroxyzine  as prescribed. -Take hydroxyzine  at bedtime if needed for allergies.  VITAMIN B12 DEFICIENCY: B12 deficiency post-cholecystectomy. -We ordered a B12 level with your next blood work.  FREQUENT URINATION: Frequent urination possibly related to stress, perimenopause, or other factors. -We tested your urine for a  UTI. -We ordered blood work including A1c, kidney function, and electrolytes. -Follow up with your OB/GYN for potential topical estrogen therapy if symptoms persist.  RECURRENT NAUSEA: Recurrent nausea of unclear cause. -Continue taking Zofran  as needed for nausea.  HEARTBURN/GERD SYMPTOMS: Heartburn possibly exacerbated by diet and anxiety. -Start taking famotidine  20 mg tablets as needed. -Follow the dietary and lifestyle modification guidance provided for GERD management.  GENERAL HEALTH MAINTENANCE: Routine health maintenance discussed. -We ordered a tetanus vaccine. -We ordered a colonoscopy with [facility]. -We scheduled fasting blood work for A1c, cholesterol, thyroid , and B12 levels.  If you have any problems before your next visit feel free to message me via MyChart (minor issues or questions) or call the office, otherwise you may reach out to schedule an office visit.  Thank you! Saddie Sacks, PA-C

## 2024-09-11 DIAGNOSIS — R350 Frequency of micturition: Secondary | ICD-10-CM | POA: Insufficient documentation

## 2024-09-11 DIAGNOSIS — Z0000 Encounter for general adult medical examination without abnormal findings: Secondary | ICD-10-CM | POA: Insufficient documentation

## 2024-09-11 DIAGNOSIS — K219 Gastro-esophageal reflux disease without esophagitis: Secondary | ICD-10-CM | POA: Insufficient documentation

## 2024-09-11 NOTE — Assessment & Plan Note (Signed)
 Advised to cut hydroxyzine  in half before bedtime to reduce AM grogginess.

## 2024-09-11 NOTE — Assessment & Plan Note (Signed)
 Routine health maintenance discussed. Mammogram and Pap smear up to date. Declined flu shot. Tetanus vaccine due. Colonoscopy scheduled. - Ordered tetanus vaccine. - Ordered colonoscopy with [facility]. - Scheduled fasting blood work for A1c, cholesterol, thyroid , and B12 levels.

## 2024-09-11 NOTE — Assessment & Plan Note (Signed)
 Recent exacerbation over the last 2 months due to stressors. Has previously tried Prozac , Effexor , and Cymbalta  in the past but discontinued these either due to inefficacy or reported weight gain.  . Pristiq  chosen for weight-neutral profile and symptom relief. - Prescribed Pristiq  with 30-day supply and refills. - Advised taking Pristiq  at the same time daily. - Discussed potential side effects and risks of Pristiq . - Advised using half tablet of hydroxyzine  at bedtime for sleep.

## 2024-09-11 NOTE — Assessment & Plan Note (Signed)
 Well-controlled.  Continue propranolol  120 mg daily for ppx.

## 2024-09-11 NOTE — Assessment & Plan Note (Signed)
 Managed with hydroxychloroquine . Transitioning rheumatology care due to previous rheumatologist recently retiring. Symptoms stable currently.  - Referred to [facility] for follow-up and management. - Continue hydroxychloroquine  200 mg twice daily fro now

## 2024-09-11 NOTE — Assessment & Plan Note (Signed)
 Chronic condition managed with montelukast  and hydroxyzine . - Refilled montelukast  and hydroxyzine . - Advised taking hydroxyzine  at bedtime if needed for allergies.

## 2024-09-11 NOTE — Assessment & Plan Note (Signed)
 Rechecking B12 levels with labs. If low, will restart B12 injections.

## 2024-09-11 NOTE — Assessment & Plan Note (Signed)
 BP goal <140/90. Above goal in office and on recheck. Home blood pressure averages reportedly 130's/80's mmHg. Elevated clinic readings likely situational. Managed with propranolol  and losartan . - Refilled propranolol  and losartan  with 90-day supplies. - Provided blood pressure log for home monitoring. - Follow up in 6 weeks to reassess BP. If BP remains elevated, will increase losartan  to 100 mg.

## 2024-09-11 NOTE — Progress Notes (Signed)
 Established Patient Office Visit  Subjective   Patient ID: Linda Vargas, female    DOB: 04-Nov-1975  Age: 48 y.o. MRN: [id number]  Chief Complaint  Patient presents with   Hypertension    HPI  History of Present Illness   Linda Vargas is a 48 year old female with anxiety, migraines, and hypertension who presents for medication management and evaluation of frequent urination.  Anxiety and sleep disturbance - Long-standing anxiety, recently worsened due to seasonal changes and increased work and family stress - Experiences anxiety attacks, particularly in the mornings - Significant insomnia, with difficulty falling and staying asleep - Previous medications include Cymbalta , Effexor , and Prozac , with variable efficacy and side effects - Takes hydroxyzine  for anxiety and allergies  Migraine HA - No recent migraines - Takes propranolol  120 mg daily for both blood pressure control and migraine prophylaxis  Hypertension management - Hypertension managed with propranolol  120 mg and losartan  50 mg daily - Amlodipine  discontinued since last visit with previous provider - Home blood pressure readings range from 132/79 to 140/80  Urinary frequency - Frequent urination every two hours, associated with menstrual cycle. She has noticed this going on for the last 3-4 months.  - No new changes to medications - No increase in fluid intake - No dysuria or urinary pain  Musculoskeletal pain and autoimmune disease - History of fibromyalgia and lupus, with ongoing joint pain difficult to attribute to either condition - Takes hydroxychloroquine  200 mg twice daily for lupus - Currently between rheumatologists due to transitions at lupus clinic  Pituitary microadenoma - History of pituitary microadenoma discovered 13-14 years ago during migraine workup - Monitored by endocrinology with no change in size or behavior  Vitamin b12 deficiency - History of B12 deficiency - Considering  resuming B12 injections pending laboratory results. Requesting recheck of her vitamin B12 levels with labs  Gastrointestinal symptoms - Recurrent nausea, uses Zofran  as needed - Episodes of heartburn, especially after fried foods or sodas - Limited response to over-the-counter antacids - History of cholecystectomy; no other significant abdominal surgeries  Allergic disorders and asthma - History of seasonal allergies, asthma, and multiple allergies (including ibuprofen, certain foods, and latex) - Uses EpiPen  for anaphylactic reactions - Takes Singulair  for allergies and asthma          ROS Per HPI.    Objective:     BP (!) 150/78   Pulse 62   Temp 98.1 F (36.7 C) (Oral)   Ht 5' 9 (1.753 m)   Wt 227 lb (103 kg)   SpO2 100%   BMI 33.52 kg/m    Physical Exam Constitutional:      General: She is not in acute distress.    Appearance: Normal appearance.  Eyes:     Pupils: Pupils are equal, round, and reactive to light.  Cardiovascular:     Rate and Rhythm: Normal rate and regular rhythm.     Heart sounds: Normal heart sounds. No murmur heard.    No friction rub. No gallop.  Pulmonary:     Effort: Pulmonary effort is normal. No respiratory distress.     Breath sounds: Normal breath sounds.  Abdominal:     Palpations: Abdomen is soft.     Tenderness: There is no abdominal tenderness.  Musculoskeletal:        General: No swelling.     Cervical back: Neck supple.  Lymphadenopathy:     Cervical: No cervical adenopathy.  Skin:    General: Skin  is warm and dry.  Neurological:     General: No focal deficit present.     Mental Status: She is alert.  Psychiatric:        Mood and Affect: Mood normal.        Behavior: Behavior normal.        Thought Content: Thought content normal.      No results found for any visits on 09/07/24.  Last CBC Lab Results  Component Value Date   WBC 4.8 08/28/2021   HGB 12.9 08/28/2021   HCT 38.0 08/28/2021   MCV 82  08/28/2021   MCH 27.9 08/28/2021   RDW 12.2 08/28/2021   PLT 452 (H) 08/28/2021   Last metabolic panel Lab Results  Component Value Date   GLUCOSE 58 (L) 08/28/2021   NA 141 08/28/2021   K 5.2 08/28/2021   CL 105 08/28/2021   CO2 22 08/28/2021   BUN 11 08/28/2021   CREATININE 0.96 08/28/2021   EGFR 75 08/28/2021   CALCIUM  9.6 08/28/2021   PROT 7.6 08/28/2021   ALBUMIN 4.2 08/28/2021   LABGLOB 3.4 08/28/2021   AGRATIO 1.2 08/28/2021   BILITOT 0.3 08/28/2021   ALKPHOS 113 08/28/2021   AST 21 08/28/2021   ALT 15 08/28/2021   ANIONGAP 11 11/08/2020   Last lipids Lab Results  Component Value Date   CHOL 201 (H) 08/28/2021   HDL 79 08/28/2021   LDLCALC 112 (H) 08/28/2021   TRIG 53 08/28/2021   CHOLHDL 2.5 08/28/2021   Last hemoglobin A1c No results found for: HGBA1C Last thyroid  functions Lab Results  Component Value Date   TSH 2.12 01/13/2023   FREET4 0.87 01/13/2023   Last vitamin D  Lab Results  Component Value Date   VD25OH 11.1 (L) 04/04/2019      The ASCVD Risk score (Arnett DK, et al., 2019) failed to calculate for the following reasons:   Cannot find a previous HDL lab   Cannot find a previous total cholesterol lab    Assessment & Plan:   Systemic lupus erythematosus with other organ involvement, unspecified SLE type [facility]) Assessment & Plan: Managed with hydroxychloroquine . Transitioning rheumatology care due to previous rheumatologist recently retiring. Symptoms stable currently.  - Referred to [facility] Rheumatology for follow-up and management. - Continue hydroxychloroquine  200 mg twice daily fro now   Orders: -     Ambulatory referral to Rheumatology  Essential hypertension Assessment & Plan: BP goal <140/90. Above goal in office and on recheck. Home blood pressure averages reportedly 130's/80's mmHg. Elevated clinic readings likely situational. Managed with propranolol  and losartan . - Refilled propranolol  and losartan  with 90-day  supplies. - Provided blood pressure log for home monitoring. - Follow up in 6 weeks to reassess BP. If BP remains elevated, will increase losartan  to 100 mg.    Orders: -     Propranolol  HCl ER; Take 1 capsule (120 mg total) by mouth daily.  Dispense: 90 capsule; Refill: 1 -     Losartan  Potassium; TAKE 1 TABLET(50 MG) BY MOUTH DAILY  Dispense: 90 tablet; Refill: 1  Intractable migraine with aura without status migrainosus Assessment & Plan: Well-controlled.  Continue propranolol  120 mg daily for ppx.   Orders: -     Propranolol  HCl ER; Take 1 capsule (120 mg total) by mouth daily.  Dispense: 90 capsule; Refill: 1  Rash and nonspecific skin eruption -     Montelukast  Sodium; Take 1 tablet (10 mg total) by mouth at bedtime.  Dispense: 90 tablet; Refill:  3  Generalized anxiety disorder Assessment & Plan: Recent exacerbation over the last 2 months due to stressors. Has previously tried Prozac , Effexor , and Cymbalta  in the past but discontinued these either due to inefficacy or reported weight gain.  . Pristiq  chosen for weight-neutral profile and symptom relief. - Prescribed Pristiq  with 30-day supply and refills. - Advised taking Pristiq  at the same time daily. - Discussed potential side effects and risks of Pristiq . - Advised using half tablet of hydroxyzine  at bedtime for sleep.  Orders: -     hydrOXYzine  HCl; Take 1 tablet (25 mg total) by mouth every 8 (eight) hours as needed.  Dispense: 90 tablet; Refill: 0  Nausea and vomiting, unspecified vomiting type  Screen for colon cancer -     Ambulatory referral to Gastroenterology  Body mass index (BMI) of 33.0-33.9 in adult -     VITAMIN D  25 Hydroxy (Vit-D Deficiency, Fractures); Future -     TSH; Future -     Hemoglobin A1c; Future -     Lipid panel; Future -     Comprehensive metabolic panel with GFR; Future -     CBC with Differential/Platelet; Future  Vitamin D  deficiency -     VITAMIN D  25 Hydroxy (Vit-D Deficiency,  Fractures); Future  Vitamin B12 deficiency Assessment & Plan: Rechecking B12 levels with labs. If low, will restart B12 injections.  Orders: -     B12 and Folate Panel; Future  Frequent urination Assessment & Plan: Frequent urination possibly related to stress, perimenopause, or other factors. - Ordered blood work including A1c, kidney function, and electrolytes. - Advised follow-up with OB/GYN for potential topical estrogen therapy if symptoms persist.   Orders: -     POCT URINALYSIS DIP (CLINITEK)  Primary insomnia Assessment & Plan: Advised to cut hydroxyzine  in half before bedtime to reduce AM grogginess.    Fibromyalgia Assessment & Plan: Chronic joint pain overlapping with lupus. Previous Cymbalta  trial not tolerated. - Referred to [facility] for further evaluation and management. - Suspect if patient tolerates Pristiq  well, may notice some improvement in joint pain.   Pituitary microadenoma [facility]) Assessment & Plan: Microadenoma under surveillance with no growth. Managed by endocrinology. - Continue surveillance with endocrinology.    Chronic allergic rhinitis Assessment & Plan: Chronic condition managed with montelukast  and hydroxyzine . - Refilled montelukast  and hydroxyzine . - Advised taking hydroxyzine  at bedtime if needed for allergies.   Gastroesophageal reflux disease without esophagitis Assessment & Plan: Heartburn possibly exacerbated by diet and anxiety. Limited relief from OTC treatments. - Prescribed famotidine  20 mg tablets, to be taken as needed. - Provided dietary and lifestyle modification guidance for GERD management. - Continue with Zofran  as needed for acute nausea.   Health care maintenance Assessment & Plan: Routine health maintenance discussed. Mammogram and Pap smear up to date. Declined flu shot. Tetanus vaccine due. Colonoscopy scheduled. - Ordered tetanus vaccine. - Ordered colonoscopy with [facility]. - Scheduled  fasting blood work for A1c, cholesterol, thyroid , and B12 levels.    Other orders -     Desvenlafaxine  Succinate ER; Take 1 tablet (25 mg total) by mouth daily.  Dispense: 30 tablet; Refill: 2 -     Famotidine ; Take 1 tablet (20 mg total) by mouth 2 (two) times daily.  Dispense: 90 tablet; Refill: 1    Return in about 6 weeks (around 10/19/2024) for Mood, HTN.    Linda JULIANNA Sacks, PA-C

## 2024-09-11 NOTE — Assessment & Plan Note (Signed)
 Chronic joint pain overlapping with lupus. Previous Cymbalta  trial not tolerated. - Referred to [facility] for further evaluation and management. - Suspect if patient tolerates Pristiq  well, may notice some improvement in joint pain.

## 2024-09-11 NOTE — Assessment & Plan Note (Signed)
 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 Managed by endocrinology. - Continue surveillance with endocrinology.

## 2024-09-11 NOTE — Assessment & Plan Note (Signed)
 Frequent urination possibly related to stress, perimenopause, or other factors. - Ordered blood work including A1c, kidney function, and electrolytes. - Advised follow-up with OB/GYN for potential topical estrogen therapy if symptoms persist.

## 2024-09-11 NOTE — Assessment & Plan Note (Signed)
 Heartburn possibly exacerbated by diet and anxiety. Limited relief from OTC treatments. - Prescribed famotidine  20 mg tablets, to be taken as needed. - Provided dietary and lifestyle modification guidance for GERD management. - Continue with Zofran  as needed for acute nausea.

## 2024-10-05 ENCOUNTER — Telehealth

## 2024-10-05 LAB — CBC WITH DIFFERENTIAL/PLATELET
Basophils Absolute: 0 x10E3/uL (ref 0.0–0.2)
Basos: 1 %
EOS (ABSOLUTE): 0.1 x10E3/uL (ref 0.0–0.4)
Eos: 3 %
Hematocrit: 41.2 % (ref 34.0–46.6)
Hemoglobin: 12.9 g/dL (ref 11.1–15.9)
Immature Grans (Abs): 0 x10E3/uL (ref 0.0–0.1)
Immature Granulocytes: 0 %
Lymphocytes Absolute: 1 x10E3/uL (ref 0.7–3.1)
Lymphs: 26 %
MCH: 27.8 pg (ref 26.6–33.0)
MCHC: 31.3 g/dL — ABNORMAL LOW (ref 31.5–35.7)
MCV: 89 fL (ref 79–97)
Monocytes Absolute: 0.4 x10E3/uL (ref 0.1–0.9)
Monocytes: 9 %
Neutrophils Absolute: 2.4 x10E3/uL (ref 1.4–7.0)
Neutrophils: 61 %
Platelets: 386 x10E3/uL (ref 150–450)
RBC: 4.64 x10E6/uL (ref 3.77–5.28)
RDW: 11.8 % (ref 11.7–15.4)
WBC: 3.9 x10E3/uL (ref 3.4–10.8)

## 2024-10-05 LAB — LIPID PANEL
Chol/HDL Ratio: 2.3 ratio (ref 0.0–4.4)
Cholesterol, Total: 194 mg/dL (ref 100–199)
HDL: 85 mg/dL
LDL Chol Calc (NIH): 100 mg/dL — ABNORMAL HIGH (ref 0–99)
Triglycerides: 45 mg/dL (ref 0–149)
VLDL Cholesterol Cal: 9 mg/dL (ref 5–40)

## 2024-10-05 LAB — COMPREHENSIVE METABOLIC PANEL WITH GFR
ALT: 16 IU/L (ref 0–32)
AST: 19 IU/L (ref 0–40)
Albumin: 4.1 g/dL (ref 3.9–4.9)
Alkaline Phosphatase: 94 IU/L (ref 41–116)
BUN/Creatinine Ratio: 9 (ref 9–23)
BUN: 12 mg/dL (ref 6–24)
Bilirubin Total: 0.5 mg/dL (ref 0.0–1.2)
CO2: 24 mmol/L (ref 20–29)
Calcium: 9.6 mg/dL (ref 8.7–10.2)
Chloride: 104 mmol/L (ref 96–106)
Creatinine, Ser: 1.35 mg/dL — ABNORMAL HIGH (ref 0.57–1.00)
Globulin, Total: 3.1 g/dL (ref 1.5–4.5)
Glucose: 86 mg/dL (ref 70–99)
Potassium: 4.1 mmol/L (ref 3.5–5.2)
Sodium: 139 mmol/L (ref 134–144)
Total Protein: 7.2 g/dL (ref 6.0–8.5)
eGFR: 49 mL/min/1.73 — ABNORMAL LOW

## 2024-10-05 LAB — B12 AND FOLATE PANEL
Folate: 6.4 ng/mL
Vitamin B-12: 338 pg/mL (ref 232–1245)

## 2024-10-05 LAB — VITAMIN D 25 HYDROXY (VIT D DEFICIENCY, FRACTURES): Vit D, 25-Hydroxy: 13.1 ng/mL — ABNORMAL LOW (ref 30.0–100.0)

## 2024-10-05 LAB — TSH: TSH: 2.02 u[IU]/mL (ref 0.450–4.500)

## 2024-10-05 LAB — HEMOGLOBIN A1C
Est. average glucose Bld gHb Est-mCnc: 117 mg/dL
Hgb A1c MFr Bld: 5.7 % — ABNORMAL HIGH (ref 4.8–5.6)

## 2024-10-07 ENCOUNTER — Telehealth: Admitting: Family Medicine

## 2024-10-07 ENCOUNTER — Ambulatory Visit: Payer: Self-pay

## 2024-10-07 DIAGNOSIS — B3731 Acute candidiasis of vulva and vagina: Secondary | ICD-10-CM | POA: Diagnosis not present

## 2024-10-07 MED ORDER — FLUCONAZOLE 150 MG PO TABS: ORAL_TABLET | ORAL | 0 refills | Status: AC

## 2024-10-07 NOTE — Patient Instructions (Signed)
 " Linda Vargas, thank you for joining Roosvelt Mater, PA-C for today's virtual visit.  While this provider is not your primary care provider (PCP), if your PCP is located in our provider database this encounter information will be shared with them immediately following your visit.   A [facility] MyChart account gives you access to today's visit and all your visits, tests, and labs performed at [facility]  click here if you don't have a [facility] MyChart account or go to [email or web address]  Consent: (Patient) Linda Vargas provided verbal consent for this virtual visit at the beginning of the encounter.  Current Medications:  Current Outpatient Medications:    fluconazole  (DIFLUCAN ) 150 MG tablet, Take one today today, if symptoms persist after 72 hours, may take 2nd dose., Disp: 2 tablet, Rfl: 0   acetaminophen  (TYLENOL ) 500 MG tablet, Take 1,000 mg by mouth every 8 (eight) hours as needed for headache. , Disp: , Rfl:    albuterol  (VENTOLIN  HFA) 108 (90 Base) MCG/ACT inhaler, Inhale 2 puffs into the lungs every 6 (six) hours as needed for wheezing or shortness of breath., Disp: 8.5 g, Rfl: 2   butalbital -acetaminophen -caffeine  (FIORICET ) 50-325-40 MG tablet, butalbital -acetaminophen -caffeine  50 mg-325 mg-40 mg tablet  TAKE 1 TABLET BY MOUTH EVERY 4 HOURS FOR UP TO 10 DAYS AS NEEDED FOR PAIN, Disp: , Rfl:    desvenlafaxine  (PRISTIQ ) 25 MG 24 hr tablet, Take 1 tablet (25 mg total) by mouth daily., Disp: 30 tablet, Rfl: 2   EPIPEN 2-PAK 0.3 MG/0.3ML SOAJ injection, INJECT INTRAMUSCULARLY AS DIRECTED FOR ANAPHYLATIC REACTIONS, Disp: , Rfl: 0   famotidine  (PEPCID ) 20 MG tablet, Take 1 tablet (20 mg total) by mouth 2 (two) times daily., Disp: 90 tablet, Rfl: 1   fluticasone  (FLONASE ) 50 MCG/ACT nasal spray, Place 2 sprays into both nostrils daily., Disp: 16 g, Rfl: 0   hydroxychloroquine  (PLAQUENIL ) 200 MG tablet, Take 200 mg by mouth 2 (two) times daily., Disp: , Rfl:     hydrOXYzine  (ATARAX ) 25 MG tablet, Take 1 tablet (25 mg total) by mouth every 8 (eight) hours as needed., Disp: 90 tablet, Rfl: 0   losartan  (COZAAR ) 50 MG tablet, TAKE 1 TABLET(50 MG) BY MOUTH DAILY, Disp: 90 tablet, Rfl: 1   montelukast  (SINGULAIR ) 10 MG tablet, Take 1 tablet (10 mg total) by mouth at bedtime., Disp: 90 tablet, Rfl: 3   ondansetron  (ZOFRAN -ODT) 8 MG disintegrating tablet, DISSOLVE 1 TABLET(8 MG) ON THE TONGUE TWICE DAILY, Disp: 30 tablet, Rfl: 2   propranolol  ER (INDERAL  LA) 120 MG 24 hr capsule, Take 1 capsule (120 mg total) by mouth daily., Disp: 90 capsule, Rfl: 1   Medications ordered in this encounter:  Meds ordered this encounter  Medications   fluconazole  (DIFLUCAN ) 150 MG tablet    Sig: Take one today today, if symptoms persist after 72 hours, may take 2nd dose.    Dispense:  2 tablet    Refill:  0     *If you need refills on other medications prior to your next appointment, please contact your pharmacy*  Follow-Up: Call back or seek an in-person evaluation if the symptoms worsen or if the condition fails to improve as anticipated.  [facility] [phone]  Other Instructions Vaginal Yeast Infection, Adult  Vaginal yeast infection is a condition that causes vaginal discharge as well as soreness, swelling, and redness (inflammation) of the vagina. This is a common condition. Some women get this infection frequently. What are the causes? This  condition is caused by a change in the normal balance of the yeast (Candida) and normal bacteria that live in the vagina. This change causes an overgrowth of yeast, which causes the inflammation. What increases the risk? The condition is more likely to develop in women who: Take antibiotic medicines. Have diabetes. Take birth control pills. Are pregnant. Douche often. Have a weak body defense system (immune system). Have been taking steroid medicines for a long time. Frequently wear tight  clothing. What are the signs or symptoms? Symptoms of this condition include: White, thick, creamy vaginal discharge. Swelling, itching, redness, and irritation of the vagina. The lips of the vagina (labia) may be affected as well. Pain or a burning feeling while urinating. Pain during sex. How is this diagnosed? This condition is diagnosed based on: Your medical history. A physical exam. A pelvic exam. Your health care provider will examine a sample of your vaginal discharge under a microscope. Your health care provider may send this sample for testing to confirm the diagnosis. How is this treated? This condition is treated with medicine. Medicines may be over-the-counter or prescription. You may be told to use one or more of the following: Medicine that is taken by mouth (orally). Medicine that is applied as a cream (topically). Medicine that is inserted directly into the vagina (suppository). Follow these instructions at home: Take or apply over-the-counter and prescription medicines only as told by your health care provider. Do not use tampons until your health care provider approves. Do not have sex until your infection has cleared. Sex can prolong or worsen your symptoms of infection. Ask your health care provider when it is safe to resume sexual activity. Keep all follow-up visits. This is important. How is this prevented?  Do not wear tight clothes, such as pantyhose or tight pants. Wear breathable cotton underwear. Do not use douches, perfumed soap, creams, or powders. Wipe from front to back after using the toilet. If you have diabetes, keep your blood sugar levels under control. Ask your health care provider for other ways to prevent yeast infections. Contact a health care provider if: You have a fever. Your symptoms go away and then return. Your symptoms do not get better with treatment. Your symptoms get worse. You have new symptoms. You develop blisters in or around  your vagina. You have blood coming from your vagina and it is not your menstrual period. You develop pain in your abdomen. Summary Vaginal yeast infection is a condition that causes discharge as well as soreness, swelling, and redness (inflammation) of the vagina. This condition is treated with medicine. Medicines may be over-the-counter or prescription. Take or apply over-the-counter and prescription medicines only as told by your health care provider. Do not douche. Resume sexual activity or use of tampons as instructed by your health care provider. Contact a health care provider if your symptoms do not get better with treatment or your symptoms go away and then return. This information is not intended to replace advice given to you by your health care provider. Make sure you discuss any questions you have with your health care provider. Document Revised: 12/17/2020 Document Reviewed: 12/17/2020 Elsevier Patient Education  2024 Elsevier Inc.   If you have been instructed to have an in-person evaluation today at a local Urgent Care facility, please use the link below. It will take you to a list of all of our available [facility], including address, phone number and hours of operation. Please do not delay care.  [facility]  If you or a family member do not have a primary care provider, use the link below to schedule a visit and establish care. When you choose a [facility] primary care physician or advanced practice provider, you gain a long-term partner in health. Find a Primary Care Provider  Learn more about [facility] in-office and virtual care options:  - Get Care Now  "

## 2024-10-07 NOTE — Progress Notes (Signed)
 " Virtual Visit Consent   Linda Vargas, you are scheduled for a virtual visit with a [facility] provider today. Just as with appointments in the office, your consent must be obtained to participate. Your consent will be active for this visit and any virtual visit you may have with one of our providers in the next 365 days. If you have a MyChart account, a copy of this consent can be sent to you electronically.  As this is a virtual visit, video technology does not allow for your provider to perform a traditional examination. This may limit your provider's ability to fully assess your condition. If your provider identifies any concerns that need to be evaluated in person or the need to arrange testing (such as labs, EKG, etc.), we will make arrangements to do so. Although advances in technology are sophisticated, we cannot ensure that it will always work on either your end or our end. If the connection with a video visit is poor, the visit may have to be switched to a telephone visit. With either a video or telephone visit, we are not always able to ensure that we have a secure connection.  By engaging in this virtual visit, you consent to the provision of healthcare and authorize for your insurance to be billed (if applicable) for the services provided during this visit. Depending on your insurance coverage, you may receive a charge related to this service.  I need to obtain your verbal consent now. Are you willing to proceed with your visit today? Linda Vargas has provided verbal consent on 10/07/2024 for a virtual visit (video or telephone). Roosvelt Mater, [place]  Date: 10/07/2024 6:32 PM   Virtual Visit via Video Note   I, Roosvelt Mater, connected with  HUE STEVESON  ([id number], Mar 06, 1976) on 10/07/2024 at  6:30 PM EST by a video-enabled telemedicine application and verified that I am speaking with the correct person using two identifiers.  Location: Patient: Virtual Visit  Location Patient: Home Provider: Virtual Visit Location Provider: Home Office   I discussed the limitations of evaluation and management by telemedicine and the availability of in person appointments. The patient expressed understanding and agreed to proceed.    History of Present Illness: Linda Vargas is a 48 y.o. who identifies as a female who was assigned female at birth, and is being seen today for c/o for the past week has been experiencing white vaginal discharge that is thick like cottage cheese. Pt states it irritating and itchy.   Pt states she has tried Fluconazole  in the past and has needed more than one dose. Pt states she called to get an appointment with her OBGYN but they do not have any availability as soon as she needs it.   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   Rash and nonspecific skin eruption 04/07/2021   Primary insomnia 01/11/2020   Vitamin D  deficiency 04/11/2019   Vitamin B12 deficiency 04/03/2019   Essential hypertension 04/06/2018   Generalized anxiety disorder 04/06/2018   Depression, major, single episode, moderate (HCC) 01/21/2017   Intractable migraine with aura without status migrainosus 12/12/2016  Fibromyalgia 11/12/2012   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  Resting comfortably  at home.  Head is normocephalic, atraumatic.  No labored breathing.  Speech  is clear and coherent with logical content.  Patient is alert and oriented at baseline.    Assessment and Plan: 1. Yeast vaginitis (Primary) - fluconazole  (DIFLUCAN ) 150 MG tablet; Take one today today, if symptoms persist after 72 hours, may take 2nd dose.  Dispense: 2 tablet; Refill: 0  -Pt to follow up with PCP or GYN for persistent symptoms.   Follow Up Instructions: I discussed the assessment and treatment plan with the patient. The patient was provided an opportunity to ask questions and all were answered. The patient agreed with the plan and demonstrated an understanding of the instructions.  A copy of instructions were sent to the patient via MyChart unless otherwise noted below.    The patient was advised to call back or seek an in-person evaluation if the symptoms worsen or if the condition fails to improve as anticipated.    Roosvelt Mater, PA-C     [1]  Allergies Allergen Reactions   Ibuprofen Swelling    Lips swell   Bean Pod Extract Hives   Corn-Containing Products Hives   Peanut-Containing Drug Products Hives   Latex Other (See Comments)  [2]  Current Outpatient Medications:    fluconazole  (DIFLUCAN ) 150 MG tablet, Take one today today, if symptoms persist after 72 hours, may take 2nd dose., Disp: 2 tablet, Rfl: 0   acetaminophen  (TYLENOL ) 500 MG tablet, Take 1,000 mg by mouth every 8 (eight) hours as needed for headache. , Disp: , Rfl:    albuterol  (VENTOLIN  HFA) 108 (90 Base) MCG/ACT inhaler, Inhale 2 puffs into the lungs every 6 (six) hours as needed for wheezing or shortness of breath., Disp: 8.5 g, Rfl: 2   butalbital -acetaminophen -caffeine  (FIORICET ) 50-325-40 MG tablet, butalbital -acetaminophen -caffeine  50 mg-325 mg-40 mg tablet  TAKE 1 TABLET BY MOUTH EVERY 4 HOURS FOR UP TO 10 DAYS AS NEEDED FOR PAIN, Disp: , Rfl:    desvenlafaxine  (PRISTIQ ) 25 MG 24 hr tablet, Take 1 tablet (25 mg total) by mouth daily., Disp: 30 tablet, Rfl: 2   EPIPEN 2-PAK 0.3 MG/0.3ML  SOAJ injection, INJECT INTRAMUSCULARLY AS DIRECTED FOR ANAPHYLATIC REACTIONS, Disp: , Rfl: 0   famotidine  (PEPCID ) 20 MG tablet, Take 1 tablet (20 mg total) by mouth 2 (two) times daily., Disp: 90 tablet, Rfl: 1   fluticasone  (FLONASE ) 50 MCG/ACT nasal spray, Place 2 sprays into both nostrils daily., Disp: 16 g, Rfl: 0   hydroxychloroquine  (PLAQUENIL ) 200 MG tablet, Take 200 mg by mouth 2 (two) times daily., Disp: , Rfl:    hydrOXYzine  (ATARAX ) 25 MG tablet, Take 1 tablet (25 mg total) by mouth every 8 (eight) hours as needed., Disp: 90 tablet, Rfl: 0   losartan  (COZAAR ) 50 MG tablet, TAKE 1 TABLET(50 MG) BY MOUTH DAILY, Disp: 90 tablet, Rfl: 1   montelukast  (SINGULAIR ) 10 MG tablet, Take 1 tablet (10 mg total) by mouth at bedtime., Disp: 90 tablet, Rfl: 3   ondansetron  (ZOFRAN -ODT) 8 MG disintegrating tablet, DISSOLVE 1 TABLET(8 MG) ON THE TONGUE TWICE DAILY, Disp: 30 tablet, Rfl: 2   propranolol  ER (INDERAL  LA) 120 MG 24 hr capsule, Take 1 capsule (120 mg total) by mouth daily., Disp: 90 capsule, Rfl: 1  "

## 2024-10-14 ENCOUNTER — Ambulatory Visit

## 2024-10-14 VITALS — BP 130/74 | HR 75 | Temp 98.7°F | Ht 69.0 in | Wt 224.1 lb

## 2024-10-14 DIAGNOSIS — E559 Vitamin D deficiency, unspecified: Secondary | ICD-10-CM | POA: Diagnosis not present

## 2024-10-14 DIAGNOSIS — F411 Generalized anxiety disorder: Secondary | ICD-10-CM

## 2024-10-14 DIAGNOSIS — F5101 Primary insomnia: Secondary | ICD-10-CM | POA: Diagnosis not present

## 2024-10-14 DIAGNOSIS — E7849 Other hyperlipidemia: Secondary | ICD-10-CM

## 2024-10-14 DIAGNOSIS — G43119 Migraine with aura, intractable, without status migrainosus: Secondary | ICD-10-CM | POA: Diagnosis not present

## 2024-10-14 DIAGNOSIS — I10 Essential (primary) hypertension: Secondary | ICD-10-CM

## 2024-10-14 DIAGNOSIS — F321 Major depressive disorder, single episode, moderate: Secondary | ICD-10-CM

## 2024-10-14 MED ORDER — DESVENLAFAXINE SUCCINATE ER 50 MG PO TB24: 50.0000 mg | ORAL_TABLET | Freq: Every day | ORAL | 2 refills | Status: AC

## 2024-10-14 MED ORDER — CHOLECALCIFEROL 1.25 MG (50000 UT) PO TABS: 50000.0000 [IU] | ORAL_TABLET | ORAL | 0 refills | Status: AC

## 2024-10-14 NOTE — Patient Instructions (Signed)
 VISIT SUMMARY: You had a follow-up visit to evaluate the effectiveness of Pristiq  and address other health concerns. We discussed your mood symptoms, sleep issues, recent viral symptoms, headache history, prediabetes, hyperlipidemia, lupus, and vitamin D  deficiency.  YOUR PLAN: MAJOR DEPRESSIVE DISORDER: Your mood symptoms have not significantly improved with the current low dose of Pristiq , but you have not experienced any side effects. -Increase Pristiq  to the second highest dose. -Continue taking hydroxyzine  as needed for sleep.  SYSTEMIC LUPUS ERYTHEMATOSUS: Your kidney function is slightly off but well-managed, and your blood pressure is well-controlled. -Continue current management for lupus.  HYPERTENSION: Your blood pressure is well-controlled with your current medication. -Continue current antihypertensive medications.  PREDIABETES: Your A1c level is 5.7, indicating prediabetes, and you have a family history of diabetes. -Continue lifestyle modifications including diet and exercise. -Recheck A1c in three months, excluding Halloween, Thanksgiving, and Christmas.  HYPERLIPIDEMIA: Your cholesterol levels have improved, but your LDL is slightly above normal. Your overall risk score is low. -Continue lifestyle modifications including diet and exercise. -Recheck cholesterol and add LPA and ApoB labs at your next appointment.  VITAMIN D  DEFICIENCY: Your vitamin D  levels are low, which may be contributing to your fatigue. -Take a prescription strength vitamin D  supplement once a week for 12 weeks. -After 12 weeks, switch to an over-the-counter daily vitamin D  supplement.  If you have any problems before your next visit feel free to message me via MyChart (minor issues or questions) or call the office, otherwise you may reach out to schedule an office visit.  Thank you! Saddie Sacks, PA-C

## 2024-10-17 DIAGNOSIS — E785 Hyperlipidemia, unspecified: Secondary | ICD-10-CM | POA: Insufficient documentation

## 2024-10-17 NOTE — Assessment & Plan Note (Signed)
 Vitamin D  level deficient, potentially contributing to fatigue. - Prescribed prescription strength vitamin D  supplement once a week for 12 weeks. - Transition to over-the-counter daily vitamin D  supplement after 12 weeks.

## 2024-10-17 NOTE — Assessment & Plan Note (Signed)
 BP goal <140/90. At goal.  Home blood pressure averages reportedly 130's/80's mmHg. Elevated clinic readings likely situational. Managed with propranolol  and losartan . - Cont propranolol  and losartan  with 90-day supplies.

## 2024-10-17 NOTE — Assessment & Plan Note (Signed)
 Previously tried and failed prozac , cymbalta , and effexor  either due to inefficacy or reported weight gain.  Pristiq  at low dose showed no significant improvement. Hydroxyzine  aids sleep. - Increased Pristiq  to second highest dose (50 mg)  - Continue hydroxyzine  half tablet as needed for sleep.

## 2024-10-17 NOTE — Assessment & Plan Note (Signed)
 Last lipid panel: LDL 100, HDL 85, Trig 45. The 10-year ASCVD risk score (Arnett DK, et al., 2019) is: 1.4% Cholesterol levels improved. LDL slightly above normal. Low risk score. - Continue lifestyle modifications including diet and exercise. - Recheck cholesterol and add LPA and ApoB labs at next appointment.

## 2024-10-17 NOTE — Assessment & Plan Note (Signed)
 Well-controlled.  Continue propranolol  120 mg daily for ppx. Fiorocet PRN for acute migraine.

## 2024-10-17 NOTE — Assessment & Plan Note (Signed)
 Improved at half dose of hydroxyzine  as needed. Continue this medication as prescribed.

## 2024-10-17 NOTE — Progress Notes (Signed)
 "  Established Patient Office Visit  Subjective   Patient ID: Linda Vargas, female    DOB: 1976-07-03  Age: 49 y.o. MRN: [id number]  Chief Complaint  Patient presents with   Medication Management    HPI  Discussed the use of AI scribe software for clinical note transcription with the patient, who gave verbal consent to proceed.  History of Present Illness   Linda Vargas is a 48 year old female who presents for a six-week follow-up to evaluate the effectiveness of Pristiq  and other health concerns.  Mood symptoms and antidepressant therapy - No significant improvement in mood symptoms since starting Pristiq . - Symptoms have not worsened. - Currently on the lowest dose of Pristiq . - No side effects from Pristiq .  Sleep disturbance and anxiety - Sleep has improved since school has been out. - No current issues with sleep or anticipatory anxiety about the next day. - Uses hydroxyzine  as needed for sleep. - Uncertain if improved sleep is due to hydroxyzine  or reduced stress.  Recent viral symptoms - Recent onset of feeling unwell with achiness, sweats, and headache. - No measured fever. - Manages symptoms with Tylenol . - No congestion or cough.  Headache and migraine history - History of migraines, managed with Fioricet . - Current headache described as more sinus-related than typical migraine. - Uses Singulair  and Flonase  for allergies.  Prediabetes and metabolic risk - Recent blood work shows A1c of 5.7, consistent with prediabetes. - Attempting dietary changes and increasing water intake. - Family history of diabetes in father, aunt, and grandmother.  Hyperlipidemia and cardiovascular risk - Recent labs show slight improvement in cholesterol levels. - HDL is high; LDL is 100. - Family history of heart disease.  Lupus and renal function - History of lupus, which impacts her  kidney function. - Recent labs show stable renal function.  Vitamin d  deficiency  and fatigue - Low vitamin D  levels on recent labs. - Fatigue and low energy may be related to vitamin D  deficiency. - Has not yet started vitamin D  supplementation.          ROS Per HPI.    Objective:     BP 130/74   Pulse 75   Temp 98.7 F (37.1 C) (Oral)   Ht 5' 9 (1.753 m)   Wt 224 lb 1.9 oz (101.7 kg)   SpO2 100%   BMI 33.10 kg/m    Physical Exam Constitutional:      General: She is not in acute distress.    Appearance: Normal appearance.  Eyes:     Pupils: Pupils are equal, round, and reactive to light.  Cardiovascular:     Rate and Rhythm: Normal rate and regular rhythm.     Heart sounds: Normal heart sounds. No murmur heard.    No friction rub. No gallop.  Pulmonary:     Effort: Pulmonary effort is normal. No respiratory distress.     Breath sounds: Normal breath sounds.  Musculoskeletal:        General: No swelling.     Cervical back: Neck supple.  Lymphadenopathy:     Cervical: No cervical adenopathy.  Skin:    General: Skin is warm and dry.  Neurological:     General: No focal deficit present.     Mental Status: She is alert.  Psychiatric:        Mood and Affect: Mood normal.        Behavior: Behavior normal.        Thought  Content: Thought content normal.      No results found for any visits on 10/14/24.  Last CBC Lab Results  Component Value Date   WBC 3.9 10/04/2024   HGB 12.9 10/04/2024   HCT 41.2 10/04/2024   MCV 89 10/04/2024   MCH 27.8 10/04/2024   RDW 11.8 10/04/2024   PLT 386 10/04/2024   Last metabolic panel Lab Results  Component Value Date   GLUCOSE 86 10/04/2024   NA 139 10/04/2024   K 4.1 10/04/2024   CL 104 10/04/2024   CO2 24 10/04/2024   BUN 12 10/04/2024   CREATININE 1.35 (H) 10/04/2024   EGFR 49 (L) 10/04/2024   CALCIUM  9.6 10/04/2024   PROT 7.2 10/04/2024   ALBUMIN 4.1 10/04/2024   LABGLOB 3.1 10/04/2024   AGRATIO 1.2 08/28/2021   BILITOT 0.5 10/04/2024   ALKPHOS 94 10/04/2024   AST 19 10/04/2024    ALT 16 10/04/2024   ANIONGAP 11 11/08/2020   Last lipids Lab Results  Component Value Date   CHOL 194 10/04/2024   HDL 85 10/04/2024   LDLCALC 100 (H) 10/04/2024   TRIG 45 10/04/2024   CHOLHDL 2.3 10/04/2024   Last hemoglobin A1c Lab Results  Component Value Date   HGBA1C 5.7 (H) 10/04/2024   Last thyroid  functions Lab Results  Component Value Date   TSH 2.020 10/04/2024   FREET4 0.87 01/13/2023   Last vitamin D  Lab Results  Component Value Date   VD25OH 13.1 (L) 10/04/2024      The 10-year ASCVD risk score (Arnett DK, et al., 2019) is: 1.4%    Assessment & Plan:   Generalized anxiety disorder Assessment & Plan: Previously tried and failed prozac , cymbalta , and effexor  either due to inefficacy or reported weight gain.  Pristiq  at low dose showed no significant improvement. Hydroxyzine  aids sleep. - Increased Pristiq  to second highest dose (50 mg)  - Continue hydroxyzine  half tablet as needed for sleep.   Depression, major, single episode, moderate (HCC) Assessment & Plan: Previously tried and failed prozac , cymbalta , and effexor  either due to inefficacy or reported weight gain.  Pristiq  at low dose showed no significant improvement. Hydroxyzine  aids sleep. - Increased Pristiq  to second highest dose (50 mg)  - Continue hydroxyzine  half tablet as needed for sleep.   Essential hypertension Assessment & Plan: BP goal <140/90. At goal.  Home blood pressure averages reportedly 130's/80's mmHg. Elevated clinic readings likely situational. Managed with propranolol  and losartan . - Cont propranolol  and losartan  with 90-day supplies.   Primary insomnia Assessment & Plan: Improved at half dose of hydroxyzine  as needed. Continue this medication as prescribed.    Intractable migraine with aura without status migrainosus Assessment & Plan: Well-controlled.  Continue propranolol  120 mg daily for ppx. Fiorocet PRN for acute migraine.    Vitamin D   deficiency Assessment & Plan: Vitamin D  level deficient, potentially contributing to fatigue. - Prescribed prescription strength vitamin D  supplement once a week for 12 weeks. - Transition to over-the-counter daily vitamin D  supplement after 12 weeks.    Other hyperlipidemia Assessment & Plan: Last lipid panel: LDL 100, HDL 85, Trig 45. The 10-year ASCVD risk score (Arnett DK, et al., 2019) is: 1.4% Cholesterol levels improved. LDL slightly above normal. Low risk score. - Continue lifestyle modifications including diet and exercise. - Recheck cholesterol and add LPA and ApoB labs at next appointment.    Other orders -     Desvenlafaxine  Succinate ER; Take 1 tablet (50 mg total) by mouth daily.  Dispense: 90 tablet; Refill: 2 -     Cholecalciferol ; Take 50,000 Units by mouth once a week.  Dispense: 12 tablet; Refill: 0      Return in about 3 months (around 01/12/2025) for Mood, pre-DM.    Saddie JULIANNA Sacks, PA-C "

## 2024-11-01 MED ORDER — HYDROXYCHLOROQUINE SULFATE 200 MG PO TABS: 200.0000 mg | ORAL_TABLET | Freq: Two times a day (BID) | ORAL | 0 refills | Status: AC

## 2024-11-23 ENCOUNTER — Ambulatory Visit: Payer: Self-pay | Admitting: Podiatry

## 2025-01-16 ENCOUNTER — Ambulatory Visit
